# Patient Record
Sex: Female | Born: 1935 | Race: White | Hispanic: No | Marital: Married | Smoking: Former smoker
Health system: Southern US, Community
[De-identification: ages and names within clinical notes are randomized; demographics above are authoritative.]

## PROBLEM LIST (undated history)

## (undated) DIAGNOSIS — R739 Hyperglycemia, unspecified: Secondary | ICD-10-CM

## (undated) DIAGNOSIS — I639 Cerebral infarction, unspecified: Secondary | ICD-10-CM

## (undated) DIAGNOSIS — R079 Chest pain, unspecified: Secondary | ICD-10-CM

## (undated) DIAGNOSIS — E119 Type 2 diabetes mellitus without complications: Secondary | ICD-10-CM

## (undated) DIAGNOSIS — I10 Essential (primary) hypertension: Secondary | ICD-10-CM

## (undated) DIAGNOSIS — I429 Cardiomyopathy, unspecified: Secondary | ICD-10-CM

## (undated) DIAGNOSIS — I5022 Chronic systolic (congestive) heart failure: Secondary | ICD-10-CM

## (undated) DIAGNOSIS — K219 Gastro-esophageal reflux disease without esophagitis: Secondary | ICD-10-CM

## (undated) DIAGNOSIS — H409 Unspecified glaucoma: Secondary | ICD-10-CM

## (undated) DIAGNOSIS — R7303 Prediabetes: Secondary | ICD-10-CM

## (undated) DIAGNOSIS — E559 Vitamin D deficiency, unspecified: Secondary | ICD-10-CM

## (undated) DIAGNOSIS — F419 Anxiety disorder, unspecified: Secondary | ICD-10-CM

## (undated) DIAGNOSIS — I447 Left bundle-branch block, unspecified: Secondary | ICD-10-CM

## (undated) DIAGNOSIS — M1990 Unspecified osteoarthritis, unspecified site: Secondary | ICD-10-CM

## (undated) DIAGNOSIS — E785 Hyperlipidemia, unspecified: Secondary | ICD-10-CM

## (undated) DIAGNOSIS — H9190 Unspecified hearing loss, unspecified ear: Secondary | ICD-10-CM

## (undated) DIAGNOSIS — I4949 Other premature depolarization: Secondary | ICD-10-CM

## (undated) DIAGNOSIS — IMO0002 Reserved for concepts with insufficient information to code with codable children: Secondary | ICD-10-CM

## (undated) HISTORY — DX: Prediabetes: R73.03

## (undated) HISTORY — DX: Cardiomyopathy, unspecified: I42.9

## (undated) HISTORY — DX: Chest pain, unspecified: R07.9

## (undated) HISTORY — DX: Hyperlipidemia, unspecified: E78.5

## (undated) HISTORY — DX: Other premature depolarization: I49.49

## (undated) HISTORY — DX: Essential (primary) hypertension: I10

## (undated) HISTORY — DX: Unspecified glaucoma: H40.9

## (undated) HISTORY — DX: Reserved for concepts with insufficient information to code with codable children: IMO0002

## (undated) HISTORY — DX: Left bundle-branch block, unspecified: I44.7

## (undated) HISTORY — DX: Chronic systolic (congestive) heart failure: I50.22

## (undated) HISTORY — DX: Unspecified osteoarthritis, unspecified site: M19.90

## (undated) HISTORY — PX: TONSILLECTOMY: SHX5217

## (undated) HISTORY — DX: Cerebral infarction, unspecified: I63.9

## (undated) HISTORY — DX: Gastro-esophageal reflux disease without esophagitis: K21.9

---

## 1997-09-22 ENCOUNTER — Ambulatory Visit (HOSPITAL_COMMUNITY): Admission: RE | Admit: 1997-09-22 | Discharge: 1997-09-22 | Payer: Self-pay | Admitting: Internal Medicine

## 1998-04-13 ENCOUNTER — Ambulatory Visit (HOSPITAL_COMMUNITY): Admission: RE | Admit: 1998-04-13 | Discharge: 1998-04-13 | Payer: Self-pay | Admitting: Internal Medicine

## 1998-05-16 ENCOUNTER — Other Ambulatory Visit: Admission: RE | Admit: 1998-05-16 | Discharge: 1998-05-16 | Payer: Self-pay | Admitting: Gynecology

## 1999-05-25 ENCOUNTER — Encounter: Payer: Self-pay | Admitting: Internal Medicine

## 1999-05-25 ENCOUNTER — Ambulatory Visit (HOSPITAL_COMMUNITY): Admission: RE | Admit: 1999-05-25 | Discharge: 1999-05-25 | Payer: Self-pay | Admitting: Internal Medicine

## 1999-07-17 ENCOUNTER — Encounter: Payer: Self-pay | Admitting: Urology

## 1999-07-18 ENCOUNTER — Inpatient Hospital Stay (HOSPITAL_COMMUNITY): Admission: EM | Admit: 1999-07-18 | Discharge: 1999-07-20 | Payer: Self-pay | Admitting: Urology

## 2000-06-10 ENCOUNTER — Encounter: Payer: Self-pay | Admitting: Internal Medicine

## 2000-06-10 ENCOUNTER — Ambulatory Visit (HOSPITAL_COMMUNITY): Admission: RE | Admit: 2000-06-10 | Discharge: 2000-06-10 | Payer: Self-pay | Admitting: Internal Medicine

## 2001-06-12 ENCOUNTER — Ambulatory Visit (HOSPITAL_COMMUNITY): Admission: RE | Admit: 2001-06-12 | Discharge: 2001-06-12 | Payer: Self-pay | Admitting: Internal Medicine

## 2001-06-12 ENCOUNTER — Encounter: Payer: Self-pay | Admitting: Internal Medicine

## 2002-06-23 ENCOUNTER — Encounter: Payer: Self-pay | Admitting: Internal Medicine

## 2002-06-23 ENCOUNTER — Ambulatory Visit (HOSPITAL_COMMUNITY): Admission: RE | Admit: 2002-06-23 | Discharge: 2002-06-23 | Payer: Self-pay | Admitting: Internal Medicine

## 2003-05-06 ENCOUNTER — Other Ambulatory Visit: Admission: RE | Admit: 2003-05-06 | Discharge: 2003-05-06 | Payer: Self-pay | Admitting: Internal Medicine

## 2003-06-27 ENCOUNTER — Ambulatory Visit (HOSPITAL_COMMUNITY): Admission: RE | Admit: 2003-06-27 | Discharge: 2003-06-27 | Payer: Self-pay | Admitting: Internal Medicine

## 2004-06-27 ENCOUNTER — Ambulatory Visit (HOSPITAL_COMMUNITY): Admission: RE | Admit: 2004-06-27 | Discharge: 2004-06-27 | Payer: Self-pay | Admitting: Internal Medicine

## 2005-07-01 ENCOUNTER — Ambulatory Visit (HOSPITAL_COMMUNITY): Admission: RE | Admit: 2005-07-01 | Discharge: 2005-07-01 | Payer: Self-pay | Admitting: Internal Medicine

## 2005-07-22 ENCOUNTER — Encounter: Admission: RE | Admit: 2005-07-22 | Discharge: 2005-07-22 | Payer: Self-pay | Admitting: Internal Medicine

## 2005-11-13 ENCOUNTER — Encounter: Payer: Self-pay | Admitting: Internal Medicine

## 2006-08-13 ENCOUNTER — Ambulatory Visit (HOSPITAL_COMMUNITY): Admission: RE | Admit: 2006-08-13 | Discharge: 2006-08-13 | Payer: Self-pay | Admitting: Internal Medicine

## 2007-09-11 ENCOUNTER — Ambulatory Visit (HOSPITAL_COMMUNITY): Admission: RE | Admit: 2007-09-11 | Discharge: 2007-09-11 | Payer: Self-pay | Admitting: Internal Medicine

## 2008-07-11 ENCOUNTER — Encounter: Payer: Self-pay | Admitting: Internal Medicine

## 2008-09-12 DEATH — deceased

## 2008-10-11 ENCOUNTER — Ambulatory Visit (HOSPITAL_COMMUNITY): Admission: RE | Admit: 2008-10-11 | Discharge: 2008-10-11 | Payer: Self-pay | Admitting: Internal Medicine

## 2009-10-11 ENCOUNTER — Encounter: Payer: Self-pay | Admitting: Internal Medicine

## 2009-10-27 ENCOUNTER — Ambulatory Visit (HOSPITAL_COMMUNITY): Admission: RE | Admit: 2009-10-27 | Discharge: 2009-10-27 | Payer: Self-pay | Admitting: Internal Medicine

## 2010-02-19 ENCOUNTER — Encounter: Payer: Self-pay | Admitting: Internal Medicine

## 2010-02-20 ENCOUNTER — Ambulatory Visit: Payer: Self-pay | Admitting: Internal Medicine

## 2010-02-20 DIAGNOSIS — I447 Left bundle-branch block, unspecified: Secondary | ICD-10-CM

## 2010-02-20 DIAGNOSIS — I10 Essential (primary) hypertension: Secondary | ICD-10-CM

## 2010-03-15 ENCOUNTER — Ambulatory Visit (HOSPITAL_COMMUNITY): Admission: RE | Admit: 2010-03-15 | Discharge: 2010-03-15 | Payer: Self-pay | Admitting: Internal Medicine

## 2010-03-15 ENCOUNTER — Ambulatory Visit: Payer: Self-pay

## 2010-03-15 ENCOUNTER — Ambulatory Visit: Payer: Self-pay | Admitting: Cardiology

## 2010-08-05 ENCOUNTER — Encounter: Payer: Self-pay | Admitting: Internal Medicine

## 2010-08-14 NOTE — Letter (Signed)
Summary: [facility]  [facility]   Imported By: Marylou Mccoy 02/27/2010 11:45:24  _____________________________________________________________________  External Attachment:    Type:   Image     Comment:   External Document

## 2010-08-14 NOTE — Assessment & Plan Note (Signed)
Summary: Jonni Sanger pains,left bundle branch,.wpw-mb   Visit Type:  Initial Consult Primary Provider:  Dr. Marisue Brooklyn   History of Present Illness: Leslie [name] is referred today by Dr. Elisabeth Most for evaluation of symptomatic PVC's and LBBB.  The patient notes that she has been "disgustingly healthy" and has no c/p or sob or syncope.  She noted when she was taking her blood pressure that her pulse was irregular and she was found to have frequent PVC's.  She is about to be married and is referred for additional evaluation.  She remains active and has only rare palpitations.  No change in bowel or bladder habit.    Current Medications (verified): 1)  Quinapril Hcl 20 Mg Tabs (Quinapril Hcl) .... Take One Tablet By Mouth Once Daily. 2)  Atenolol 50 Mg Tabs (Atenolol) .... Take One Tablet By Mouth Daily 3)  Felodipine 5 Mg Xr24h-Tab (Felodipine) .... Take One Tablet By Mouth Once Daily. 4)  Estradiol 1 Mg Tabs (Estradiol) .... Take One Tablet By Mouth Once Daily. 5)  Estazolam 1 Mg Tabs (Estazolam) .... As Needed 6)  Flaxseed Oil 1000 Mg Caps (Flaxseed (Linseed)) .... Take One Tablet By Mouth Once Daily. 7)  Cod Liver Oil  Oil (Cod Liver Oil) .... Once Daily 8)  Multivitamins   Tabs (Multiple Vitamin) .... Once Daily 9)  Magnesium 250 Mg Tabs (Magnesium) .... 2 Tabs Daily 10)  Tramadol Hcl 50 Mg Tabs (Tramadol Hcl) .... 1/2 Tablet As Needed  Allergies (verified): 1)  ! Ibuprofen  Past History:  Past Medical History: Last updated: 02/20/2010 Current Problems:  CHEST PAIN (ICD-786.50) LBBB (ICD-426.3)  Family History: Non-contributory.  Her parents are both deceased of unknown causes. Two brothers died of MI.  Social History: Widowed times two. Soon to be married again No ETOH or tobacco.  Review of Systems       All systems reviewed and negative except as noted in the HPI.  Vital Signs:  Patient profile:   77 year old female Height:      62 inches Weight:      135  pounds BMI:     24.78 Pulse rate:   78 / minute BP sitting:   140 / 78  (left arm)  Vitals Entered By: Laurance Flatten CMA (February 20, 2010 12:23 PM)  Physical Exam  General:  Elderly, well developed, well nourished, in no acute distress.  HEENT: normal Neck: supple. No JVD. Carotids 2+ bilaterally no bruits Cor: IRRR no rubs, gallops or murmur Lungs: CTA Ab: soft, nontender. nondistended. No HSM. Good bowel sounds Ext: warm. no cyanosis, clubbing or edema Neuro: alert and oriented. Grossly nonfocal. affect pleasant    EKG  Procedure date:  02/20/2010  Findings:      Normal sinus rhythm with rate of:78.   PVC's noted.  Left axis deviation.    Impression & Recommendations:  Problem # 1:  LBBB (ICD-426.3) She has never had syncope.  Will followup. Her updated medication list for this problem includes:    Quinapril Hcl 20 Mg Tabs (Quinapril hcl) .Marland Kitchen... Take one tablet by mouth once daily.    Atenolol 50 Mg Tabs (Atenolol) .Marland Kitchen... Take one tablet by mouth daily    Felodipine 5 Mg Xr24h-tab (Felodipine) .Marland Kitchen... Take one tablet by mouth once daily.  Orders: Echocardiogram (Echo)  Problem # 2:  ESSENTIAL HYPERTENSION, BENIGN (ICD-401.1) Her blood pressure appears to be mostly controlled on meds as below. Her updated medication list for this problem includes:  Quinapril Hcl 20 Mg Tabs (Quinapril hcl) .Marland Kitchen... Take one tablet by mouth once daily.    Atenolol 50 Mg Tabs (Atenolol) .Marland Kitchen... Take one tablet by mouth daily    Felodipine 5 Mg Xr24h-tab (Felodipine) .Marland Kitchen... Take one tablet by mouth once daily.  Problem # 3:  PREMATURE VENTRICULAR CONTRACTIONS, FREQUENT (ICD-427.69) Her PVC's appear to be originating from the RVOT. Will continue current meds as they are not symptomatic and will check a 2D echo to evaluate her LV function. Her updated medication list for this problem includes:    Quinapril Hcl 20 Mg Tabs (Quinapril hcl) .Marland Kitchen... Take one tablet by mouth once daily.    Atenolol 50 Mg  Tabs (Atenolol) .Marland Kitchen... Take one tablet by mouth daily    Felodipine 5 Mg Xr24h-tab (Felodipine) .Marland Kitchen... Take one tablet by mouth once daily.  Patient Instructions: 1)  Your physician recommends that you schedule a follow-up appointment in: 12 months. 2)  Your physician recommends that you continue on your current medications as directed. Please refer to the Current Medication list given to you today. 3)  Your physician has requested that you have an echocardiogram.  Echocardiography is a painless test that uses sound waves to create images of your heart. It provides your doctor with information about the size and shape of your heart and how well your heart's chambers and valves are working.  This procedure takes approximately one hour. There are no restrictions for this procedure.

## 2010-08-14 NOTE — Letter (Signed)
Summary: [facility]  [facility]   Imported By: Marylou Mccoy 02/27/2010 11:37:27  _____________________________________________________________________  External Attachment:    Type:   Image     Comment:   External Document

## 2010-11-29 ENCOUNTER — Other Ambulatory Visit (HOSPITAL_COMMUNITY): Payer: Self-pay | Admitting: Internal Medicine

## 2010-11-29 DIAGNOSIS — Z1231 Encounter for screening mammogram for malignant neoplasm of breast: Secondary | ICD-10-CM

## 2010-12-12 ENCOUNTER — Ambulatory Visit (HOSPITAL_COMMUNITY)
Admission: RE | Admit: 2010-12-12 | Discharge: 2010-12-12 | Disposition: A | Discharge status: Home / Self Care | Payer: Medicare Other | Source: Ambulatory Visit | Attending: Internal Medicine | Admitting: Internal Medicine

## 2010-12-12 DIAGNOSIS — Z1231 Encounter for screening mammogram for malignant neoplasm of breast: Secondary | ICD-10-CM | POA: Insufficient documentation

## 2011-07-16 HISTORY — PX: GLAUCOMA SURGERY: SHX656

## 2011-07-23 DIAGNOSIS — H40019 Open angle with borderline findings, low risk, unspecified eye: Secondary | ICD-10-CM | POA: Diagnosis not present

## 2011-07-23 DIAGNOSIS — H2510 Age-related nuclear cataract, unspecified eye: Secondary | ICD-10-CM | POA: Diagnosis not present

## 2011-07-30 DIAGNOSIS — H40239 Intermittent angle-closure glaucoma, unspecified eye: Secondary | ICD-10-CM | POA: Diagnosis not present

## 2011-08-12 DIAGNOSIS — E782 Mixed hyperlipidemia: Secondary | ICD-10-CM | POA: Diagnosis not present

## 2011-08-12 DIAGNOSIS — R7309 Other abnormal glucose: Secondary | ICD-10-CM | POA: Diagnosis not present

## 2011-08-12 DIAGNOSIS — I10 Essential (primary) hypertension: Secondary | ICD-10-CM | POA: Diagnosis not present

## 2011-10-18 DIAGNOSIS — H40039 Anatomical narrow angle, unspecified eye: Secondary | ICD-10-CM | POA: Diagnosis not present

## 2011-11-11 DIAGNOSIS — R7309 Other abnormal glucose: Secondary | ICD-10-CM | POA: Diagnosis not present

## 2011-11-11 DIAGNOSIS — E782 Mixed hyperlipidemia: Secondary | ICD-10-CM | POA: Diagnosis not present

## 2011-11-11 DIAGNOSIS — Z79899 Other long term (current) drug therapy: Secondary | ICD-10-CM | POA: Diagnosis not present

## 2011-11-11 DIAGNOSIS — I10 Essential (primary) hypertension: Secondary | ICD-10-CM | POA: Diagnosis not present

## 2011-11-11 DIAGNOSIS — E559 Vitamin D deficiency, unspecified: Secondary | ICD-10-CM | POA: Diagnosis not present

## 2011-12-02 DIAGNOSIS — H4020X0 Unspecified primary angle-closure glaucoma, stage unspecified: Secondary | ICD-10-CM | POA: Diagnosis not present

## 2011-12-02 DIAGNOSIS — H40039 Anatomical narrow angle, unspecified eye: Secondary | ICD-10-CM | POA: Diagnosis not present

## 2011-12-30 DIAGNOSIS — M999 Biomechanical lesion, unspecified: Secondary | ICD-10-CM | POA: Diagnosis not present

## 2011-12-30 DIAGNOSIS — M62838 Other muscle spasm: Secondary | ICD-10-CM | POA: Diagnosis not present

## 2011-12-30 DIAGNOSIS — M5137 Other intervertebral disc degeneration, lumbosacral region: Secondary | ICD-10-CM | POA: Diagnosis not present

## 2012-01-01 DIAGNOSIS — M999 Biomechanical lesion, unspecified: Secondary | ICD-10-CM | POA: Diagnosis not present

## 2012-01-01 DIAGNOSIS — M5137 Other intervertebral disc degeneration, lumbosacral region: Secondary | ICD-10-CM | POA: Diagnosis not present

## 2012-01-01 DIAGNOSIS — M62838 Other muscle spasm: Secondary | ICD-10-CM | POA: Diagnosis not present

## 2012-01-03 DIAGNOSIS — M5137 Other intervertebral disc degeneration, lumbosacral region: Secondary | ICD-10-CM | POA: Diagnosis not present

## 2012-01-03 DIAGNOSIS — M999 Biomechanical lesion, unspecified: Secondary | ICD-10-CM | POA: Diagnosis not present

## 2012-01-03 DIAGNOSIS — M62838 Other muscle spasm: Secondary | ICD-10-CM | POA: Diagnosis not present

## 2012-01-06 DIAGNOSIS — M62838 Other muscle spasm: Secondary | ICD-10-CM | POA: Diagnosis not present

## 2012-01-06 DIAGNOSIS — M999 Biomechanical lesion, unspecified: Secondary | ICD-10-CM | POA: Diagnosis not present

## 2012-01-06 DIAGNOSIS — M5137 Other intervertebral disc degeneration, lumbosacral region: Secondary | ICD-10-CM | POA: Diagnosis not present

## 2012-01-08 DIAGNOSIS — M5137 Other intervertebral disc degeneration, lumbosacral region: Secondary | ICD-10-CM | POA: Diagnosis not present

## 2012-01-08 DIAGNOSIS — M999 Biomechanical lesion, unspecified: Secondary | ICD-10-CM | POA: Diagnosis not present

## 2012-01-08 DIAGNOSIS — M62838 Other muscle spasm: Secondary | ICD-10-CM | POA: Diagnosis not present

## 2012-01-10 DIAGNOSIS — M5137 Other intervertebral disc degeneration, lumbosacral region: Secondary | ICD-10-CM | POA: Diagnosis not present

## 2012-01-10 DIAGNOSIS — M62838 Other muscle spasm: Secondary | ICD-10-CM | POA: Diagnosis not present

## 2012-01-10 DIAGNOSIS — M999 Biomechanical lesion, unspecified: Secondary | ICD-10-CM | POA: Diagnosis not present

## 2012-01-13 DIAGNOSIS — M62838 Other muscle spasm: Secondary | ICD-10-CM | POA: Diagnosis not present

## 2012-01-13 DIAGNOSIS — M999 Biomechanical lesion, unspecified: Secondary | ICD-10-CM | POA: Diagnosis not present

## 2012-01-13 DIAGNOSIS — M5137 Other intervertebral disc degeneration, lumbosacral region: Secondary | ICD-10-CM | POA: Diagnosis not present

## 2012-01-15 DIAGNOSIS — M5137 Other intervertebral disc degeneration, lumbosacral region: Secondary | ICD-10-CM | POA: Diagnosis not present

## 2012-01-15 DIAGNOSIS — M62838 Other muscle spasm: Secondary | ICD-10-CM | POA: Diagnosis not present

## 2012-01-15 DIAGNOSIS — M999 Biomechanical lesion, unspecified: Secondary | ICD-10-CM | POA: Diagnosis not present

## 2012-01-20 DIAGNOSIS — M5137 Other intervertebral disc degeneration, lumbosacral region: Secondary | ICD-10-CM | POA: Diagnosis not present

## 2012-01-20 DIAGNOSIS — M999 Biomechanical lesion, unspecified: Secondary | ICD-10-CM | POA: Diagnosis not present

## 2012-01-20 DIAGNOSIS — M62838 Other muscle spasm: Secondary | ICD-10-CM | POA: Diagnosis not present

## 2012-01-22 DIAGNOSIS — M999 Biomechanical lesion, unspecified: Secondary | ICD-10-CM | POA: Diagnosis not present

## 2012-01-22 DIAGNOSIS — M62838 Other muscle spasm: Secondary | ICD-10-CM | POA: Diagnosis not present

## 2012-01-22 DIAGNOSIS — M5137 Other intervertebral disc degeneration, lumbosacral region: Secondary | ICD-10-CM | POA: Diagnosis not present

## 2012-01-24 DIAGNOSIS — M62838 Other muscle spasm: Secondary | ICD-10-CM | POA: Diagnosis not present

## 2012-01-24 DIAGNOSIS — M5137 Other intervertebral disc degeneration, lumbosacral region: Secondary | ICD-10-CM | POA: Diagnosis not present

## 2012-01-24 DIAGNOSIS — M999 Biomechanical lesion, unspecified: Secondary | ICD-10-CM | POA: Diagnosis not present

## 2012-01-28 DIAGNOSIS — M5137 Other intervertebral disc degeneration, lumbosacral region: Secondary | ICD-10-CM | POA: Diagnosis not present

## 2012-01-28 DIAGNOSIS — M999 Biomechanical lesion, unspecified: Secondary | ICD-10-CM | POA: Diagnosis not present

## 2012-01-28 DIAGNOSIS — M62838 Other muscle spasm: Secondary | ICD-10-CM | POA: Diagnosis not present

## 2012-01-31 DIAGNOSIS — M5137 Other intervertebral disc degeneration, lumbosacral region: Secondary | ICD-10-CM | POA: Diagnosis not present

## 2012-01-31 DIAGNOSIS — M999 Biomechanical lesion, unspecified: Secondary | ICD-10-CM | POA: Diagnosis not present

## 2012-01-31 DIAGNOSIS — M62838 Other muscle spasm: Secondary | ICD-10-CM | POA: Diagnosis not present

## 2012-02-03 DIAGNOSIS — H4020X0 Unspecified primary angle-closure glaucoma, stage unspecified: Secondary | ICD-10-CM | POA: Diagnosis not present

## 2012-02-03 DIAGNOSIS — H40039 Anatomical narrow angle, unspecified eye: Secondary | ICD-10-CM | POA: Diagnosis not present

## 2012-02-10 DIAGNOSIS — M62838 Other muscle spasm: Secondary | ICD-10-CM | POA: Diagnosis not present

## 2012-02-10 DIAGNOSIS — M5137 Other intervertebral disc degeneration, lumbosacral region: Secondary | ICD-10-CM | POA: Diagnosis not present

## 2012-02-10 DIAGNOSIS — M999 Biomechanical lesion, unspecified: Secondary | ICD-10-CM | POA: Diagnosis not present

## 2012-02-12 ENCOUNTER — Ambulatory Visit (HOSPITAL_COMMUNITY)
Admission: RE | Admit: 2012-02-12 | Discharge: 2012-02-12 | Disposition: A | Discharge status: Home / Self Care | Payer: Medicare Other | Source: Ambulatory Visit | Attending: Internal Medicine | Admitting: Internal Medicine

## 2012-02-12 ENCOUNTER — Encounter: Payer: Self-pay | Admitting: Internal Medicine

## 2012-02-12 ENCOUNTER — Other Ambulatory Visit (HOSPITAL_COMMUNITY): Payer: Self-pay | Admitting: Internal Medicine

## 2012-02-12 DIAGNOSIS — Z1212 Encounter for screening for malignant neoplasm of rectum: Secondary | ICD-10-CM | POA: Diagnosis not present

## 2012-02-12 DIAGNOSIS — R059 Cough, unspecified: Secondary | ICD-10-CM | POA: Insufficient documentation

## 2012-02-12 DIAGNOSIS — R05 Cough: Secondary | ICD-10-CM

## 2012-02-12 DIAGNOSIS — E559 Vitamin D deficiency, unspecified: Secondary | ICD-10-CM | POA: Diagnosis not present

## 2012-02-12 DIAGNOSIS — R7309 Other abnormal glucose: Secondary | ICD-10-CM | POA: Diagnosis not present

## 2012-02-12 DIAGNOSIS — E782 Mixed hyperlipidemia: Secondary | ICD-10-CM | POA: Diagnosis not present

## 2012-02-12 DIAGNOSIS — I10 Essential (primary) hypertension: Secondary | ICD-10-CM | POA: Diagnosis not present

## 2012-02-12 DIAGNOSIS — R5383 Other fatigue: Secondary | ICD-10-CM | POA: Diagnosis not present

## 2012-02-18 DIAGNOSIS — M999 Biomechanical lesion, unspecified: Secondary | ICD-10-CM | POA: Diagnosis not present

## 2012-02-18 DIAGNOSIS — M5137 Other intervertebral disc degeneration, lumbosacral region: Secondary | ICD-10-CM | POA: Diagnosis not present

## 2012-02-18 DIAGNOSIS — M62838 Other muscle spasm: Secondary | ICD-10-CM | POA: Diagnosis not present

## 2012-02-25 DIAGNOSIS — M999 Biomechanical lesion, unspecified: Secondary | ICD-10-CM | POA: Diagnosis not present

## 2012-02-25 DIAGNOSIS — M62838 Other muscle spasm: Secondary | ICD-10-CM | POA: Diagnosis not present

## 2012-02-25 DIAGNOSIS — M5137 Other intervertebral disc degeneration, lumbosacral region: Secondary | ICD-10-CM | POA: Diagnosis not present

## 2012-03-03 DIAGNOSIS — Z1231 Encounter for screening mammogram for malignant neoplasm of breast: Secondary | ICD-10-CM | POA: Diagnosis not present

## 2012-03-03 DIAGNOSIS — M999 Biomechanical lesion, unspecified: Secondary | ICD-10-CM | POA: Diagnosis not present

## 2012-03-03 DIAGNOSIS — M62838 Other muscle spasm: Secondary | ICD-10-CM | POA: Diagnosis not present

## 2012-03-03 DIAGNOSIS — M5137 Other intervertebral disc degeneration, lumbosacral region: Secondary | ICD-10-CM | POA: Diagnosis not present

## 2012-03-03 DIAGNOSIS — Z13820 Encounter for screening for osteoporosis: Secondary | ICD-10-CM | POA: Diagnosis not present

## 2012-03-06 ENCOUNTER — Encounter: Payer: Self-pay | Admitting: Cardiology

## 2012-03-10 ENCOUNTER — Ambulatory Visit (INDEPENDENT_AMBULATORY_CARE_PROVIDER_SITE_OTHER): Payer: Medicare Other | Admitting: Internal Medicine

## 2012-03-10 ENCOUNTER — Encounter: Payer: Self-pay | Admitting: Internal Medicine

## 2012-03-10 VITALS — BP 134/72 | HR 73 | Ht 62.0 in | Wt 136.0 lb

## 2012-03-10 DIAGNOSIS — I447 Left bundle-branch block, unspecified: Secondary | ICD-10-CM | POA: Diagnosis not present

## 2012-03-10 NOTE — Progress Notes (Signed)
HPI [name] is referred today for evaluation of an abnormal electrocardiogram. She is a very pleasant 78 year old woman whose health has been quite good. She has mild hypertension. She tells me that she has had left bundle branch block diagnosed in the past. She denies chest pain, shortness of breath, or syncope. No near syncope or changes in vision. She has minimal peripheral edema but is otherwise been stable. Allergies  Allergen Reactions  . Advil (Ibuprofen)      Current Outpatient Prescriptions  Medication Sig Dispense Refill  . atenolol (TENORMIN) 50 MG tablet Take 25 mg by mouth daily.       Marland Kitchen estazolam (PROSOM) 1 MG tablet Take 0.5 mg by mouth at bedtime as needed.       Marland Kitchen estradiol (ESTRACE) 1 MG tablet Take 1 mg by mouth daily.      . felodipine (PLENDIL) 5 MG 24 hr tablet Take 5 mg by mouth daily.      . Flaxseed, Linseed, (FLAXSEED OIL) 1000 MG CAPS Take 1,000 mg by mouth daily.      . magnesium oxide (MAG-OX) 400 MG tablet Take 400 mg by mouth daily.      . Multiple Vitamin (MULTIVITAMIN) tablet Take 1 tablet by mouth daily.      . quinapril (ACCUPRIL) 20 MG tablet Take 20 mg by mouth at bedtime.      . traMADol (ULTRAM) 50 MG tablet Take 25 mg by mouth every 6 (six) hours as needed.         Past Medical History  Diagnosis Date  . Chest pain   . Essential hypertension, benign   . Other left bundle branch block   . Other premature beats     ROS:   All systems reviewed and negative except as noted in the HPI.   No past surgical history on file.   Family History  Problem Relation Age of Onset  . Heart attack Brother   . Heart attack Brother      History   Social History  . Marital Status: Married    Spouse Name: N/A    Number of Children: N/A  . Years of Education: N/A   Occupational History  . Not on file.   Social History Main Topics  . Smoking status: Never Smoker   . Smokeless tobacco: Not on file  . Alcohol Use: No  . Drug Use: Not on file   . Sexually Active: Not on file   Other Topics Concern  . Not on file   Social History Narrative  . No narrative on file     BP 134/72  Pulse 73  Ht 5\' 2"  (1.575 m)  Wt 136 lb (61.689 kg)  BMI 24.87 kg/m2  SpO2 94%  Physical Exam:  Well appearing elderly woman, NAD HEENT: Unremarkable Neck:  No JVD, no thyromegally Lungs:  Clear with no wheezes, rales, or rhonchi. HEART:  Regular rate rhythm, no murmurs, no rubs, no clicks split S2 is present Abd:  soft, positive bowel sounds, no organomegally, no rebound, no guarding Ext:  2 plus pulses, no edema, no cyanosis, no clubbing Skin:  No rashes no nodules Neuro:  CN II through XII intact, motor grossly intact  EKG Normal sinus rhythm with left bundle branch block.  Assess/Plan:

## 2012-03-10 NOTE — Patient Instructions (Signed)
Your physician recommends that you schedule a follow-up appointment as needed  

## 2012-03-10 NOTE — Assessment & Plan Note (Addendum)
Her abnormal ECG is due left bundle branch block and not due to the WPW syndrome. She does not have this diagnosis. I've recommended watchful waiting for this patient. No additional testing is required or indicated at this time. I did discuss the possibility of worsening heart block and the need for permanent pacemaker. She is currently no indication for pacing. She will call us if she has symptoms in this manner.

## 2012-03-11 DIAGNOSIS — M999 Biomechanical lesion, unspecified: Secondary | ICD-10-CM | POA: Diagnosis not present

## 2012-03-11 DIAGNOSIS — M62838 Other muscle spasm: Secondary | ICD-10-CM | POA: Diagnosis not present

## 2012-03-11 DIAGNOSIS — M5137 Other intervertebral disc degeneration, lumbosacral region: Secondary | ICD-10-CM | POA: Diagnosis not present

## 2012-03-17 DIAGNOSIS — H2510 Age-related nuclear cataract, unspecified eye: Secondary | ICD-10-CM | POA: Diagnosis not present

## 2012-03-17 DIAGNOSIS — H4050X0 Glaucoma secondary to other eye disorders, unspecified eye, stage unspecified: Secondary | ICD-10-CM | POA: Diagnosis not present

## 2012-03-25 DIAGNOSIS — M62838 Other muscle spasm: Secondary | ICD-10-CM | POA: Diagnosis not present

## 2012-03-25 DIAGNOSIS — M5137 Other intervertebral disc degeneration, lumbosacral region: Secondary | ICD-10-CM | POA: Diagnosis not present

## 2012-03-25 DIAGNOSIS — M999 Biomechanical lesion, unspecified: Secondary | ICD-10-CM | POA: Diagnosis not present

## 2012-04-08 DIAGNOSIS — M62838 Other muscle spasm: Secondary | ICD-10-CM | POA: Diagnosis not present

## 2012-04-08 DIAGNOSIS — M5137 Other intervertebral disc degeneration, lumbosacral region: Secondary | ICD-10-CM | POA: Diagnosis not present

## 2012-04-08 DIAGNOSIS — M999 Biomechanical lesion, unspecified: Secondary | ICD-10-CM | POA: Diagnosis not present

## 2012-04-22 DIAGNOSIS — M999 Biomechanical lesion, unspecified: Secondary | ICD-10-CM | POA: Diagnosis not present

## 2012-04-22 DIAGNOSIS — M62838 Other muscle spasm: Secondary | ICD-10-CM | POA: Diagnosis not present

## 2012-04-22 DIAGNOSIS — M5137 Other intervertebral disc degeneration, lumbosacral region: Secondary | ICD-10-CM | POA: Diagnosis not present

## 2012-05-26 DIAGNOSIS — Z79899 Other long term (current) drug therapy: Secondary | ICD-10-CM | POA: Diagnosis not present

## 2012-05-26 DIAGNOSIS — I10 Essential (primary) hypertension: Secondary | ICD-10-CM | POA: Diagnosis not present

## 2012-05-26 DIAGNOSIS — R7309 Other abnormal glucose: Secondary | ICD-10-CM | POA: Diagnosis not present

## 2012-05-26 DIAGNOSIS — E782 Mixed hyperlipidemia: Secondary | ICD-10-CM | POA: Diagnosis not present

## 2012-05-26 DIAGNOSIS — E559 Vitamin D deficiency, unspecified: Secondary | ICD-10-CM | POA: Diagnosis not present

## 2012-09-09 DIAGNOSIS — I10 Essential (primary) hypertension: Secondary | ICD-10-CM | POA: Diagnosis not present

## 2012-09-09 DIAGNOSIS — R7309 Other abnormal glucose: Secondary | ICD-10-CM | POA: Diagnosis not present

## 2012-09-09 DIAGNOSIS — E782 Mixed hyperlipidemia: Secondary | ICD-10-CM | POA: Diagnosis not present

## 2012-09-09 DIAGNOSIS — Z79899 Other long term (current) drug therapy: Secondary | ICD-10-CM | POA: Diagnosis not present

## 2012-09-09 DIAGNOSIS — E559 Vitamin D deficiency, unspecified: Secondary | ICD-10-CM | POA: Diagnosis not present

## 2012-09-10 DIAGNOSIS — R7309 Other abnormal glucose: Secondary | ICD-10-CM | POA: Diagnosis not present

## 2012-09-10 DIAGNOSIS — I10 Essential (primary) hypertension: Secondary | ICD-10-CM | POA: Diagnosis not present

## 2012-09-10 DIAGNOSIS — Z79899 Other long term (current) drug therapy: Secondary | ICD-10-CM | POA: Diagnosis not present

## 2012-09-10 DIAGNOSIS — E782 Mixed hyperlipidemia: Secondary | ICD-10-CM | POA: Diagnosis not present

## 2012-09-10 DIAGNOSIS — E559 Vitamin D deficiency, unspecified: Secondary | ICD-10-CM | POA: Diagnosis not present

## 2012-09-17 DIAGNOSIS — H4011X0 Primary open-angle glaucoma, stage unspecified: Secondary | ICD-10-CM | POA: Diagnosis not present

## 2012-09-17 DIAGNOSIS — H409 Unspecified glaucoma: Secondary | ICD-10-CM | POA: Diagnosis not present

## 2012-11-11 DIAGNOSIS — H4011X0 Primary open-angle glaucoma, stage unspecified: Secondary | ICD-10-CM | POA: Diagnosis not present

## 2012-11-11 DIAGNOSIS — H409 Unspecified glaucoma: Secondary | ICD-10-CM | POA: Diagnosis not present

## 2012-11-26 DIAGNOSIS — M9981 Other biomechanical lesions of cervical region: Secondary | ICD-10-CM | POA: Diagnosis not present

## 2012-11-26 DIAGNOSIS — IMO0001 Reserved for inherently not codable concepts without codable children: Secondary | ICD-10-CM | POA: Diagnosis not present

## 2012-11-26 DIAGNOSIS — IMO0002 Reserved for concepts with insufficient information to code with codable children: Secondary | ICD-10-CM | POA: Diagnosis not present

## 2012-11-26 DIAGNOSIS — M461 Sacroiliitis, not elsewhere classified: Secondary | ICD-10-CM | POA: Diagnosis not present

## 2012-11-26 DIAGNOSIS — M999 Biomechanical lesion, unspecified: Secondary | ICD-10-CM | POA: Diagnosis not present

## 2012-11-30 DIAGNOSIS — IMO0002 Reserved for concepts with insufficient information to code with codable children: Secondary | ICD-10-CM | POA: Diagnosis not present

## 2012-11-30 DIAGNOSIS — M9981 Other biomechanical lesions of cervical region: Secondary | ICD-10-CM | POA: Diagnosis not present

## 2012-11-30 DIAGNOSIS — IMO0001 Reserved for inherently not codable concepts without codable children: Secondary | ICD-10-CM | POA: Diagnosis not present

## 2012-11-30 DIAGNOSIS — M999 Biomechanical lesion, unspecified: Secondary | ICD-10-CM | POA: Diagnosis not present

## 2012-11-30 DIAGNOSIS — M461 Sacroiliitis, not elsewhere classified: Secondary | ICD-10-CM | POA: Diagnosis not present

## 2012-12-01 DIAGNOSIS — M9981 Other biomechanical lesions of cervical region: Secondary | ICD-10-CM | POA: Diagnosis not present

## 2012-12-01 DIAGNOSIS — M461 Sacroiliitis, not elsewhere classified: Secondary | ICD-10-CM | POA: Diagnosis not present

## 2012-12-01 DIAGNOSIS — IMO0001 Reserved for inherently not codable concepts without codable children: Secondary | ICD-10-CM | POA: Diagnosis not present

## 2012-12-01 DIAGNOSIS — IMO0002 Reserved for concepts with insufficient information to code with codable children: Secondary | ICD-10-CM | POA: Diagnosis not present

## 2012-12-01 DIAGNOSIS — M999 Biomechanical lesion, unspecified: Secondary | ICD-10-CM | POA: Diagnosis not present

## 2012-12-02 DIAGNOSIS — M9981 Other biomechanical lesions of cervical region: Secondary | ICD-10-CM | POA: Diagnosis not present

## 2012-12-02 DIAGNOSIS — M461 Sacroiliitis, not elsewhere classified: Secondary | ICD-10-CM | POA: Diagnosis not present

## 2012-12-02 DIAGNOSIS — IMO0002 Reserved for concepts with insufficient information to code with codable children: Secondary | ICD-10-CM | POA: Diagnosis not present

## 2012-12-02 DIAGNOSIS — M999 Biomechanical lesion, unspecified: Secondary | ICD-10-CM | POA: Diagnosis not present

## 2012-12-02 DIAGNOSIS — IMO0001 Reserved for inherently not codable concepts without codable children: Secondary | ICD-10-CM | POA: Diagnosis not present

## 2012-12-03 DIAGNOSIS — IMO0002 Reserved for concepts with insufficient information to code with codable children: Secondary | ICD-10-CM | POA: Diagnosis not present

## 2012-12-03 DIAGNOSIS — M9981 Other biomechanical lesions of cervical region: Secondary | ICD-10-CM | POA: Diagnosis not present

## 2012-12-03 DIAGNOSIS — M461 Sacroiliitis, not elsewhere classified: Secondary | ICD-10-CM | POA: Diagnosis not present

## 2012-12-03 DIAGNOSIS — M999 Biomechanical lesion, unspecified: Secondary | ICD-10-CM | POA: Diagnosis not present

## 2012-12-03 DIAGNOSIS — IMO0001 Reserved for inherently not codable concepts without codable children: Secondary | ICD-10-CM | POA: Diagnosis not present

## 2012-12-08 DIAGNOSIS — M9981 Other biomechanical lesions of cervical region: Secondary | ICD-10-CM | POA: Diagnosis not present

## 2012-12-08 DIAGNOSIS — M999 Biomechanical lesion, unspecified: Secondary | ICD-10-CM | POA: Diagnosis not present

## 2012-12-08 DIAGNOSIS — IMO0002 Reserved for concepts with insufficient information to code with codable children: Secondary | ICD-10-CM | POA: Diagnosis not present

## 2012-12-08 DIAGNOSIS — M461 Sacroiliitis, not elsewhere classified: Secondary | ICD-10-CM | POA: Diagnosis not present

## 2012-12-08 DIAGNOSIS — IMO0001 Reserved for inherently not codable concepts without codable children: Secondary | ICD-10-CM | POA: Diagnosis not present

## 2012-12-10 DIAGNOSIS — M461 Sacroiliitis, not elsewhere classified: Secondary | ICD-10-CM | POA: Diagnosis not present

## 2012-12-10 DIAGNOSIS — IMO0002 Reserved for concepts with insufficient information to code with codable children: Secondary | ICD-10-CM | POA: Diagnosis not present

## 2012-12-10 DIAGNOSIS — M999 Biomechanical lesion, unspecified: Secondary | ICD-10-CM | POA: Diagnosis not present

## 2012-12-10 DIAGNOSIS — M9981 Other biomechanical lesions of cervical region: Secondary | ICD-10-CM | POA: Diagnosis not present

## 2012-12-10 DIAGNOSIS — IMO0001 Reserved for inherently not codable concepts without codable children: Secondary | ICD-10-CM | POA: Diagnosis not present

## 2012-12-14 DIAGNOSIS — IMO0001 Reserved for inherently not codable concepts without codable children: Secondary | ICD-10-CM | POA: Diagnosis not present

## 2012-12-14 DIAGNOSIS — M461 Sacroiliitis, not elsewhere classified: Secondary | ICD-10-CM | POA: Diagnosis not present

## 2012-12-14 DIAGNOSIS — M9981 Other biomechanical lesions of cervical region: Secondary | ICD-10-CM | POA: Diagnosis not present

## 2012-12-14 DIAGNOSIS — IMO0002 Reserved for concepts with insufficient information to code with codable children: Secondary | ICD-10-CM | POA: Diagnosis not present

## 2012-12-14 DIAGNOSIS — M999 Biomechanical lesion, unspecified: Secondary | ICD-10-CM | POA: Diagnosis not present

## 2012-12-15 DIAGNOSIS — M461 Sacroiliitis, not elsewhere classified: Secondary | ICD-10-CM | POA: Diagnosis not present

## 2012-12-15 DIAGNOSIS — IMO0001 Reserved for inherently not codable concepts without codable children: Secondary | ICD-10-CM | POA: Diagnosis not present

## 2012-12-15 DIAGNOSIS — IMO0002 Reserved for concepts with insufficient information to code with codable children: Secondary | ICD-10-CM | POA: Diagnosis not present

## 2012-12-15 DIAGNOSIS — M9981 Other biomechanical lesions of cervical region: Secondary | ICD-10-CM | POA: Diagnosis not present

## 2012-12-15 DIAGNOSIS — M999 Biomechanical lesion, unspecified: Secondary | ICD-10-CM | POA: Diagnosis not present

## 2012-12-16 DIAGNOSIS — Z79899 Other long term (current) drug therapy: Secondary | ICD-10-CM | POA: Diagnosis not present

## 2012-12-16 DIAGNOSIS — E782 Mixed hyperlipidemia: Secondary | ICD-10-CM | POA: Diagnosis not present

## 2012-12-16 DIAGNOSIS — I10 Essential (primary) hypertension: Secondary | ICD-10-CM | POA: Diagnosis not present

## 2012-12-16 DIAGNOSIS — E559 Vitamin D deficiency, unspecified: Secondary | ICD-10-CM | POA: Diagnosis not present

## 2012-12-16 DIAGNOSIS — R7309 Other abnormal glucose: Secondary | ICD-10-CM | POA: Diagnosis not present

## 2012-12-17 DIAGNOSIS — IMO0002 Reserved for concepts with insufficient information to code with codable children: Secondary | ICD-10-CM | POA: Diagnosis not present

## 2012-12-17 DIAGNOSIS — IMO0001 Reserved for inherently not codable concepts without codable children: Secondary | ICD-10-CM | POA: Diagnosis not present

## 2012-12-17 DIAGNOSIS — M461 Sacroiliitis, not elsewhere classified: Secondary | ICD-10-CM | POA: Diagnosis not present

## 2012-12-17 DIAGNOSIS — M9981 Other biomechanical lesions of cervical region: Secondary | ICD-10-CM | POA: Diagnosis not present

## 2012-12-17 DIAGNOSIS — M999 Biomechanical lesion, unspecified: Secondary | ICD-10-CM | POA: Diagnosis not present

## 2012-12-21 DIAGNOSIS — IMO0001 Reserved for inherently not codable concepts without codable children: Secondary | ICD-10-CM | POA: Diagnosis not present

## 2012-12-21 DIAGNOSIS — M999 Biomechanical lesion, unspecified: Secondary | ICD-10-CM | POA: Diagnosis not present

## 2012-12-21 DIAGNOSIS — IMO0002 Reserved for concepts with insufficient information to code with codable children: Secondary | ICD-10-CM | POA: Diagnosis not present

## 2012-12-21 DIAGNOSIS — M461 Sacroiliitis, not elsewhere classified: Secondary | ICD-10-CM | POA: Diagnosis not present

## 2012-12-21 DIAGNOSIS — M9981 Other biomechanical lesions of cervical region: Secondary | ICD-10-CM | POA: Diagnosis not present

## 2012-12-22 DIAGNOSIS — M461 Sacroiliitis, not elsewhere classified: Secondary | ICD-10-CM | POA: Diagnosis not present

## 2012-12-22 DIAGNOSIS — M9981 Other biomechanical lesions of cervical region: Secondary | ICD-10-CM | POA: Diagnosis not present

## 2012-12-22 DIAGNOSIS — M999 Biomechanical lesion, unspecified: Secondary | ICD-10-CM | POA: Diagnosis not present

## 2012-12-22 DIAGNOSIS — IMO0002 Reserved for concepts with insufficient information to code with codable children: Secondary | ICD-10-CM | POA: Diagnosis not present

## 2012-12-22 DIAGNOSIS — IMO0001 Reserved for inherently not codable concepts without codable children: Secondary | ICD-10-CM | POA: Diagnosis not present

## 2012-12-24 DIAGNOSIS — M461 Sacroiliitis, not elsewhere classified: Secondary | ICD-10-CM | POA: Diagnosis not present

## 2012-12-24 DIAGNOSIS — IMO0002 Reserved for concepts with insufficient information to code with codable children: Secondary | ICD-10-CM | POA: Diagnosis not present

## 2012-12-24 DIAGNOSIS — IMO0001 Reserved for inherently not codable concepts without codable children: Secondary | ICD-10-CM | POA: Diagnosis not present

## 2012-12-24 DIAGNOSIS — M9981 Other biomechanical lesions of cervical region: Secondary | ICD-10-CM | POA: Diagnosis not present

## 2012-12-24 DIAGNOSIS — M999 Biomechanical lesion, unspecified: Secondary | ICD-10-CM | POA: Diagnosis not present

## 2012-12-28 DIAGNOSIS — IMO0001 Reserved for inherently not codable concepts without codable children: Secondary | ICD-10-CM | POA: Diagnosis not present

## 2012-12-28 DIAGNOSIS — M9981 Other biomechanical lesions of cervical region: Secondary | ICD-10-CM | POA: Diagnosis not present

## 2012-12-28 DIAGNOSIS — M999 Biomechanical lesion, unspecified: Secondary | ICD-10-CM | POA: Diagnosis not present

## 2012-12-28 DIAGNOSIS — M461 Sacroiliitis, not elsewhere classified: Secondary | ICD-10-CM | POA: Diagnosis not present

## 2012-12-28 DIAGNOSIS — IMO0002 Reserved for concepts with insufficient information to code with codable children: Secondary | ICD-10-CM | POA: Diagnosis not present

## 2012-12-29 DIAGNOSIS — M999 Biomechanical lesion, unspecified: Secondary | ICD-10-CM | POA: Diagnosis not present

## 2012-12-29 DIAGNOSIS — IMO0002 Reserved for concepts with insufficient information to code with codable children: Secondary | ICD-10-CM | POA: Diagnosis not present

## 2012-12-29 DIAGNOSIS — M461 Sacroiliitis, not elsewhere classified: Secondary | ICD-10-CM | POA: Diagnosis not present

## 2012-12-29 DIAGNOSIS — IMO0001 Reserved for inherently not codable concepts without codable children: Secondary | ICD-10-CM | POA: Diagnosis not present

## 2012-12-29 DIAGNOSIS — M9981 Other biomechanical lesions of cervical region: Secondary | ICD-10-CM | POA: Diagnosis not present

## 2012-12-31 DIAGNOSIS — IMO0001 Reserved for inherently not codable concepts without codable children: Secondary | ICD-10-CM | POA: Diagnosis not present

## 2012-12-31 DIAGNOSIS — M999 Biomechanical lesion, unspecified: Secondary | ICD-10-CM | POA: Diagnosis not present

## 2012-12-31 DIAGNOSIS — M461 Sacroiliitis, not elsewhere classified: Secondary | ICD-10-CM | POA: Diagnosis not present

## 2012-12-31 DIAGNOSIS — IMO0002 Reserved for concepts with insufficient information to code with codable children: Secondary | ICD-10-CM | POA: Diagnosis not present

## 2012-12-31 DIAGNOSIS — M9981 Other biomechanical lesions of cervical region: Secondary | ICD-10-CM | POA: Diagnosis not present

## 2013-01-04 DIAGNOSIS — M9981 Other biomechanical lesions of cervical region: Secondary | ICD-10-CM | POA: Diagnosis not present

## 2013-01-04 DIAGNOSIS — IMO0001 Reserved for inherently not codable concepts without codable children: Secondary | ICD-10-CM | POA: Diagnosis not present

## 2013-01-04 DIAGNOSIS — IMO0002 Reserved for concepts with insufficient information to code with codable children: Secondary | ICD-10-CM | POA: Diagnosis not present

## 2013-01-04 DIAGNOSIS — M461 Sacroiliitis, not elsewhere classified: Secondary | ICD-10-CM | POA: Diagnosis not present

## 2013-01-04 DIAGNOSIS — M999 Biomechanical lesion, unspecified: Secondary | ICD-10-CM | POA: Diagnosis not present

## 2013-01-05 DIAGNOSIS — M461 Sacroiliitis, not elsewhere classified: Secondary | ICD-10-CM | POA: Diagnosis not present

## 2013-01-05 DIAGNOSIS — M999 Biomechanical lesion, unspecified: Secondary | ICD-10-CM | POA: Diagnosis not present

## 2013-01-05 DIAGNOSIS — IMO0002 Reserved for concepts with insufficient information to code with codable children: Secondary | ICD-10-CM | POA: Diagnosis not present

## 2013-01-05 DIAGNOSIS — IMO0001 Reserved for inherently not codable concepts without codable children: Secondary | ICD-10-CM | POA: Diagnosis not present

## 2013-01-05 DIAGNOSIS — M9981 Other biomechanical lesions of cervical region: Secondary | ICD-10-CM | POA: Diagnosis not present

## 2013-01-07 DIAGNOSIS — IMO0002 Reserved for concepts with insufficient information to code with codable children: Secondary | ICD-10-CM | POA: Diagnosis not present

## 2013-01-07 DIAGNOSIS — M461 Sacroiliitis, not elsewhere classified: Secondary | ICD-10-CM | POA: Diagnosis not present

## 2013-01-07 DIAGNOSIS — IMO0001 Reserved for inherently not codable concepts without codable children: Secondary | ICD-10-CM | POA: Diagnosis not present

## 2013-01-07 DIAGNOSIS — M9981 Other biomechanical lesions of cervical region: Secondary | ICD-10-CM | POA: Diagnosis not present

## 2013-01-07 DIAGNOSIS — M999 Biomechanical lesion, unspecified: Secondary | ICD-10-CM | POA: Diagnosis not present

## 2013-01-11 DIAGNOSIS — M999 Biomechanical lesion, unspecified: Secondary | ICD-10-CM | POA: Diagnosis not present

## 2013-01-11 DIAGNOSIS — H401490 Capsular glaucoma with pseudoexfoliation of lens, unspecified eye, stage unspecified: Secondary | ICD-10-CM | POA: Diagnosis not present

## 2013-01-11 DIAGNOSIS — M461 Sacroiliitis, not elsewhere classified: Secondary | ICD-10-CM | POA: Diagnosis not present

## 2013-01-11 DIAGNOSIS — IMO0001 Reserved for inherently not codable concepts without codable children: Secondary | ICD-10-CM | POA: Diagnosis not present

## 2013-01-11 DIAGNOSIS — IMO0002 Reserved for concepts with insufficient information to code with codable children: Secondary | ICD-10-CM | POA: Diagnosis not present

## 2013-01-11 DIAGNOSIS — M9981 Other biomechanical lesions of cervical region: Secondary | ICD-10-CM | POA: Diagnosis not present

## 2013-01-11 DIAGNOSIS — H409 Unspecified glaucoma: Secondary | ICD-10-CM | POA: Diagnosis not present

## 2013-01-12 DIAGNOSIS — M9981 Other biomechanical lesions of cervical region: Secondary | ICD-10-CM | POA: Diagnosis not present

## 2013-01-12 DIAGNOSIS — M999 Biomechanical lesion, unspecified: Secondary | ICD-10-CM | POA: Diagnosis not present

## 2013-01-12 DIAGNOSIS — IMO0002 Reserved for concepts with insufficient information to code with codable children: Secondary | ICD-10-CM | POA: Diagnosis not present

## 2013-01-12 DIAGNOSIS — M461 Sacroiliitis, not elsewhere classified: Secondary | ICD-10-CM | POA: Diagnosis not present

## 2013-01-12 DIAGNOSIS — IMO0001 Reserved for inherently not codable concepts without codable children: Secondary | ICD-10-CM | POA: Diagnosis not present

## 2013-01-14 DIAGNOSIS — IMO0001 Reserved for inherently not codable concepts without codable children: Secondary | ICD-10-CM | POA: Diagnosis not present

## 2013-01-14 DIAGNOSIS — M9981 Other biomechanical lesions of cervical region: Secondary | ICD-10-CM | POA: Diagnosis not present

## 2013-01-14 DIAGNOSIS — M461 Sacroiliitis, not elsewhere classified: Secondary | ICD-10-CM | POA: Diagnosis not present

## 2013-01-14 DIAGNOSIS — M999 Biomechanical lesion, unspecified: Secondary | ICD-10-CM | POA: Diagnosis not present

## 2013-01-14 DIAGNOSIS — IMO0002 Reserved for concepts with insufficient information to code with codable children: Secondary | ICD-10-CM | POA: Diagnosis not present

## 2013-01-18 DIAGNOSIS — IMO0001 Reserved for inherently not codable concepts without codable children: Secondary | ICD-10-CM | POA: Diagnosis not present

## 2013-01-18 DIAGNOSIS — IMO0002 Reserved for concepts with insufficient information to code with codable children: Secondary | ICD-10-CM | POA: Diagnosis not present

## 2013-01-18 DIAGNOSIS — M999 Biomechanical lesion, unspecified: Secondary | ICD-10-CM | POA: Diagnosis not present

## 2013-01-18 DIAGNOSIS — M9981 Other biomechanical lesions of cervical region: Secondary | ICD-10-CM | POA: Diagnosis not present

## 2013-01-18 DIAGNOSIS — M461 Sacroiliitis, not elsewhere classified: Secondary | ICD-10-CM | POA: Diagnosis not present

## 2013-01-19 DIAGNOSIS — M9981 Other biomechanical lesions of cervical region: Secondary | ICD-10-CM | POA: Diagnosis not present

## 2013-01-19 DIAGNOSIS — M999 Biomechanical lesion, unspecified: Secondary | ICD-10-CM | POA: Diagnosis not present

## 2013-01-19 DIAGNOSIS — IMO0002 Reserved for concepts with insufficient information to code with codable children: Secondary | ICD-10-CM | POA: Diagnosis not present

## 2013-01-19 DIAGNOSIS — M461 Sacroiliitis, not elsewhere classified: Secondary | ICD-10-CM | POA: Diagnosis not present

## 2013-01-19 DIAGNOSIS — IMO0001 Reserved for inherently not codable concepts without codable children: Secondary | ICD-10-CM | POA: Diagnosis not present

## 2013-01-21 DIAGNOSIS — M9981 Other biomechanical lesions of cervical region: Secondary | ICD-10-CM | POA: Diagnosis not present

## 2013-01-21 DIAGNOSIS — M999 Biomechanical lesion, unspecified: Secondary | ICD-10-CM | POA: Diagnosis not present

## 2013-01-21 DIAGNOSIS — IMO0002 Reserved for concepts with insufficient information to code with codable children: Secondary | ICD-10-CM | POA: Diagnosis not present

## 2013-01-21 DIAGNOSIS — M461 Sacroiliitis, not elsewhere classified: Secondary | ICD-10-CM | POA: Diagnosis not present

## 2013-01-21 DIAGNOSIS — IMO0001 Reserved for inherently not codable concepts without codable children: Secondary | ICD-10-CM | POA: Diagnosis not present

## 2013-01-25 DIAGNOSIS — IMO0002 Reserved for concepts with insufficient information to code with codable children: Secondary | ICD-10-CM | POA: Diagnosis not present

## 2013-01-25 DIAGNOSIS — M999 Biomechanical lesion, unspecified: Secondary | ICD-10-CM | POA: Diagnosis not present

## 2013-01-25 DIAGNOSIS — M9981 Other biomechanical lesions of cervical region: Secondary | ICD-10-CM | POA: Diagnosis not present

## 2013-01-25 DIAGNOSIS — IMO0001 Reserved for inherently not codable concepts without codable children: Secondary | ICD-10-CM | POA: Diagnosis not present

## 2013-01-25 DIAGNOSIS — M461 Sacroiliitis, not elsewhere classified: Secondary | ICD-10-CM | POA: Diagnosis not present

## 2013-01-26 DIAGNOSIS — M461 Sacroiliitis, not elsewhere classified: Secondary | ICD-10-CM | POA: Diagnosis not present

## 2013-01-26 DIAGNOSIS — IMO0002 Reserved for concepts with insufficient information to code with codable children: Secondary | ICD-10-CM | POA: Diagnosis not present

## 2013-01-26 DIAGNOSIS — IMO0001 Reserved for inherently not codable concepts without codable children: Secondary | ICD-10-CM | POA: Diagnosis not present

## 2013-01-26 DIAGNOSIS — M999 Biomechanical lesion, unspecified: Secondary | ICD-10-CM | POA: Diagnosis not present

## 2013-01-26 DIAGNOSIS — M9981 Other biomechanical lesions of cervical region: Secondary | ICD-10-CM | POA: Diagnosis not present

## 2013-01-28 DIAGNOSIS — IMO0002 Reserved for concepts with insufficient information to code with codable children: Secondary | ICD-10-CM | POA: Diagnosis not present

## 2013-01-28 DIAGNOSIS — M9981 Other biomechanical lesions of cervical region: Secondary | ICD-10-CM | POA: Diagnosis not present

## 2013-01-28 DIAGNOSIS — IMO0001 Reserved for inherently not codable concepts without codable children: Secondary | ICD-10-CM | POA: Diagnosis not present

## 2013-01-28 DIAGNOSIS — M461 Sacroiliitis, not elsewhere classified: Secondary | ICD-10-CM | POA: Diagnosis not present

## 2013-01-28 DIAGNOSIS — M999 Biomechanical lesion, unspecified: Secondary | ICD-10-CM | POA: Diagnosis not present

## 2013-02-02 DIAGNOSIS — IMO0002 Reserved for concepts with insufficient information to code with codable children: Secondary | ICD-10-CM | POA: Diagnosis not present

## 2013-02-02 DIAGNOSIS — M999 Biomechanical lesion, unspecified: Secondary | ICD-10-CM | POA: Diagnosis not present

## 2013-02-02 DIAGNOSIS — M9981 Other biomechanical lesions of cervical region: Secondary | ICD-10-CM | POA: Diagnosis not present

## 2013-02-02 DIAGNOSIS — IMO0001 Reserved for inherently not codable concepts without codable children: Secondary | ICD-10-CM | POA: Diagnosis not present

## 2013-02-02 DIAGNOSIS — M461 Sacroiliitis, not elsewhere classified: Secondary | ICD-10-CM | POA: Diagnosis not present

## 2013-02-04 DIAGNOSIS — IMO0001 Reserved for inherently not codable concepts without codable children: Secondary | ICD-10-CM | POA: Diagnosis not present

## 2013-02-04 DIAGNOSIS — IMO0002 Reserved for concepts with insufficient information to code with codable children: Secondary | ICD-10-CM | POA: Diagnosis not present

## 2013-02-04 DIAGNOSIS — M999 Biomechanical lesion, unspecified: Secondary | ICD-10-CM | POA: Diagnosis not present

## 2013-02-04 DIAGNOSIS — M9981 Other biomechanical lesions of cervical region: Secondary | ICD-10-CM | POA: Diagnosis not present

## 2013-02-04 DIAGNOSIS — M461 Sacroiliitis, not elsewhere classified: Secondary | ICD-10-CM | POA: Diagnosis not present

## 2013-02-08 DIAGNOSIS — M461 Sacroiliitis, not elsewhere classified: Secondary | ICD-10-CM | POA: Diagnosis not present

## 2013-02-08 DIAGNOSIS — IMO0001 Reserved for inherently not codable concepts without codable children: Secondary | ICD-10-CM | POA: Diagnosis not present

## 2013-02-08 DIAGNOSIS — M999 Biomechanical lesion, unspecified: Secondary | ICD-10-CM | POA: Diagnosis not present

## 2013-02-08 DIAGNOSIS — IMO0002 Reserved for concepts with insufficient information to code with codable children: Secondary | ICD-10-CM | POA: Diagnosis not present

## 2013-02-08 DIAGNOSIS — M9981 Other biomechanical lesions of cervical region: Secondary | ICD-10-CM | POA: Diagnosis not present

## 2013-02-11 DIAGNOSIS — M461 Sacroiliitis, not elsewhere classified: Secondary | ICD-10-CM | POA: Diagnosis not present

## 2013-02-11 DIAGNOSIS — M9981 Other biomechanical lesions of cervical region: Secondary | ICD-10-CM | POA: Diagnosis not present

## 2013-02-11 DIAGNOSIS — IMO0001 Reserved for inherently not codable concepts without codable children: Secondary | ICD-10-CM | POA: Diagnosis not present

## 2013-02-11 DIAGNOSIS — M999 Biomechanical lesion, unspecified: Secondary | ICD-10-CM | POA: Diagnosis not present

## 2013-02-11 DIAGNOSIS — IMO0002 Reserved for concepts with insufficient information to code with codable children: Secondary | ICD-10-CM | POA: Diagnosis not present

## 2013-02-16 DIAGNOSIS — M461 Sacroiliitis, not elsewhere classified: Secondary | ICD-10-CM | POA: Diagnosis not present

## 2013-02-16 DIAGNOSIS — IMO0001 Reserved for inherently not codable concepts without codable children: Secondary | ICD-10-CM | POA: Diagnosis not present

## 2013-02-16 DIAGNOSIS — IMO0002 Reserved for concepts with insufficient information to code with codable children: Secondary | ICD-10-CM | POA: Diagnosis not present

## 2013-02-16 DIAGNOSIS — M999 Biomechanical lesion, unspecified: Secondary | ICD-10-CM | POA: Diagnosis not present

## 2013-02-16 DIAGNOSIS — M9981 Other biomechanical lesions of cervical region: Secondary | ICD-10-CM | POA: Diagnosis not present

## 2013-02-18 DIAGNOSIS — M461 Sacroiliitis, not elsewhere classified: Secondary | ICD-10-CM | POA: Diagnosis not present

## 2013-02-18 DIAGNOSIS — M9981 Other biomechanical lesions of cervical region: Secondary | ICD-10-CM | POA: Diagnosis not present

## 2013-02-18 DIAGNOSIS — IMO0001 Reserved for inherently not codable concepts without codable children: Secondary | ICD-10-CM | POA: Diagnosis not present

## 2013-02-18 DIAGNOSIS — IMO0002 Reserved for concepts with insufficient information to code with codable children: Secondary | ICD-10-CM | POA: Diagnosis not present

## 2013-02-18 DIAGNOSIS — M999 Biomechanical lesion, unspecified: Secondary | ICD-10-CM | POA: Diagnosis not present

## 2013-02-22 DIAGNOSIS — M9981 Other biomechanical lesions of cervical region: Secondary | ICD-10-CM | POA: Diagnosis not present

## 2013-02-22 DIAGNOSIS — IMO0002 Reserved for concepts with insufficient information to code with codable children: Secondary | ICD-10-CM | POA: Diagnosis not present

## 2013-02-22 DIAGNOSIS — M461 Sacroiliitis, not elsewhere classified: Secondary | ICD-10-CM | POA: Diagnosis not present

## 2013-02-22 DIAGNOSIS — IMO0001 Reserved for inherently not codable concepts without codable children: Secondary | ICD-10-CM | POA: Diagnosis not present

## 2013-02-22 DIAGNOSIS — M999 Biomechanical lesion, unspecified: Secondary | ICD-10-CM | POA: Diagnosis not present

## 2013-03-23 DIAGNOSIS — IMO0001 Reserved for inherently not codable concepts without codable children: Secondary | ICD-10-CM | POA: Diagnosis not present

## 2013-03-23 DIAGNOSIS — M999 Biomechanical lesion, unspecified: Secondary | ICD-10-CM | POA: Diagnosis not present

## 2013-03-23 DIAGNOSIS — M461 Sacroiliitis, not elsewhere classified: Secondary | ICD-10-CM | POA: Diagnosis not present

## 2013-03-23 DIAGNOSIS — M9981 Other biomechanical lesions of cervical region: Secondary | ICD-10-CM | POA: Diagnosis not present

## 2013-03-23 DIAGNOSIS — IMO0002 Reserved for concepts with insufficient information to code with codable children: Secondary | ICD-10-CM | POA: Diagnosis not present

## 2013-04-01 DIAGNOSIS — IMO0002 Reserved for concepts with insufficient information to code with codable children: Secondary | ICD-10-CM | POA: Diagnosis not present

## 2013-04-01 DIAGNOSIS — M461 Sacroiliitis, not elsewhere classified: Secondary | ICD-10-CM | POA: Diagnosis not present

## 2013-04-01 DIAGNOSIS — M9981 Other biomechanical lesions of cervical region: Secondary | ICD-10-CM | POA: Diagnosis not present

## 2013-04-01 DIAGNOSIS — IMO0001 Reserved for inherently not codable concepts without codable children: Secondary | ICD-10-CM | POA: Diagnosis not present

## 2013-04-01 DIAGNOSIS — M999 Biomechanical lesion, unspecified: Secondary | ICD-10-CM | POA: Diagnosis not present

## 2013-04-08 DIAGNOSIS — M999 Biomechanical lesion, unspecified: Secondary | ICD-10-CM | POA: Diagnosis not present

## 2013-04-08 DIAGNOSIS — IMO0001 Reserved for inherently not codable concepts without codable children: Secondary | ICD-10-CM | POA: Diagnosis not present

## 2013-04-08 DIAGNOSIS — M9981 Other biomechanical lesions of cervical region: Secondary | ICD-10-CM | POA: Diagnosis not present

## 2013-04-08 DIAGNOSIS — M461 Sacroiliitis, not elsewhere classified: Secondary | ICD-10-CM | POA: Diagnosis not present

## 2013-04-08 DIAGNOSIS — IMO0002 Reserved for concepts with insufficient information to code with codable children: Secondary | ICD-10-CM | POA: Diagnosis not present

## 2013-04-15 DIAGNOSIS — IMO0001 Reserved for inherently not codable concepts without codable children: Secondary | ICD-10-CM | POA: Diagnosis not present

## 2013-04-15 DIAGNOSIS — M461 Sacroiliitis, not elsewhere classified: Secondary | ICD-10-CM | POA: Diagnosis not present

## 2013-04-15 DIAGNOSIS — IMO0002 Reserved for concepts with insufficient information to code with codable children: Secondary | ICD-10-CM | POA: Diagnosis not present

## 2013-04-15 DIAGNOSIS — M999 Biomechanical lesion, unspecified: Secondary | ICD-10-CM | POA: Diagnosis not present

## 2013-04-15 DIAGNOSIS — M9981 Other biomechanical lesions of cervical region: Secondary | ICD-10-CM | POA: Diagnosis not present

## 2013-04-23 DIAGNOSIS — H401490 Capsular glaucoma with pseudoexfoliation of lens, unspecified eye, stage unspecified: Secondary | ICD-10-CM | POA: Diagnosis not present

## 2013-04-23 DIAGNOSIS — H409 Unspecified glaucoma: Secondary | ICD-10-CM | POA: Diagnosis not present

## 2013-04-23 DIAGNOSIS — H4011X0 Primary open-angle glaucoma, stage unspecified: Secondary | ICD-10-CM | POA: Diagnosis not present

## 2013-04-29 DIAGNOSIS — IMO0002 Reserved for concepts with insufficient information to code with codable children: Secondary | ICD-10-CM | POA: Diagnosis not present

## 2013-04-29 DIAGNOSIS — M461 Sacroiliitis, not elsewhere classified: Secondary | ICD-10-CM | POA: Diagnosis not present

## 2013-04-29 DIAGNOSIS — M999 Biomechanical lesion, unspecified: Secondary | ICD-10-CM | POA: Diagnosis not present

## 2013-04-29 DIAGNOSIS — IMO0001 Reserved for inherently not codable concepts without codable children: Secondary | ICD-10-CM | POA: Diagnosis not present

## 2013-04-29 DIAGNOSIS — M9981 Other biomechanical lesions of cervical region: Secondary | ICD-10-CM | POA: Diagnosis not present

## 2013-05-13 DIAGNOSIS — IMO0001 Reserved for inherently not codable concepts without codable children: Secondary | ICD-10-CM | POA: Diagnosis not present

## 2013-05-13 DIAGNOSIS — IMO0002 Reserved for concepts with insufficient information to code with codable children: Secondary | ICD-10-CM | POA: Diagnosis not present

## 2013-05-13 DIAGNOSIS — M461 Sacroiliitis, not elsewhere classified: Secondary | ICD-10-CM | POA: Diagnosis not present

## 2013-05-13 DIAGNOSIS — M999 Biomechanical lesion, unspecified: Secondary | ICD-10-CM | POA: Diagnosis not present

## 2013-05-13 DIAGNOSIS — M9981 Other biomechanical lesions of cervical region: Secondary | ICD-10-CM | POA: Diagnosis not present

## 2013-05-27 DIAGNOSIS — M9981 Other biomechanical lesions of cervical region: Secondary | ICD-10-CM | POA: Diagnosis not present

## 2013-05-27 DIAGNOSIS — IMO0002 Reserved for concepts with insufficient information to code with codable children: Secondary | ICD-10-CM | POA: Diagnosis not present

## 2013-05-27 DIAGNOSIS — M999 Biomechanical lesion, unspecified: Secondary | ICD-10-CM | POA: Diagnosis not present

## 2013-05-27 DIAGNOSIS — M461 Sacroiliitis, not elsewhere classified: Secondary | ICD-10-CM | POA: Diagnosis not present

## 2013-05-27 DIAGNOSIS — IMO0001 Reserved for inherently not codable concepts without codable children: Secondary | ICD-10-CM | POA: Diagnosis not present

## 2013-06-09 DIAGNOSIS — IMO0001 Reserved for inherently not codable concepts without codable children: Secondary | ICD-10-CM | POA: Diagnosis not present

## 2013-06-09 DIAGNOSIS — M461 Sacroiliitis, not elsewhere classified: Secondary | ICD-10-CM | POA: Diagnosis not present

## 2013-06-09 DIAGNOSIS — IMO0002 Reserved for concepts with insufficient information to code with codable children: Secondary | ICD-10-CM | POA: Diagnosis not present

## 2013-06-09 DIAGNOSIS — M9981 Other biomechanical lesions of cervical region: Secondary | ICD-10-CM | POA: Diagnosis not present

## 2013-06-09 DIAGNOSIS — M999 Biomechanical lesion, unspecified: Secondary | ICD-10-CM | POA: Diagnosis not present

## 2013-06-24 DIAGNOSIS — M999 Biomechanical lesion, unspecified: Secondary | ICD-10-CM | POA: Diagnosis not present

## 2013-06-24 DIAGNOSIS — M461 Sacroiliitis, not elsewhere classified: Secondary | ICD-10-CM | POA: Diagnosis not present

## 2013-06-24 DIAGNOSIS — IMO0001 Reserved for inherently not codable concepts without codable children: Secondary | ICD-10-CM | POA: Diagnosis not present

## 2013-06-24 DIAGNOSIS — IMO0002 Reserved for concepts with insufficient information to code with codable children: Secondary | ICD-10-CM | POA: Diagnosis not present

## 2013-06-24 DIAGNOSIS — M9981 Other biomechanical lesions of cervical region: Secondary | ICD-10-CM | POA: Diagnosis not present

## 2013-06-30 ENCOUNTER — Encounter: Payer: Self-pay | Admitting: Internal Medicine

## 2013-07-01 ENCOUNTER — Ambulatory Visit (INDEPENDENT_AMBULATORY_CARE_PROVIDER_SITE_OTHER): Payer: Medicare Other | Admitting: Internal Medicine

## 2013-07-01 ENCOUNTER — Encounter: Payer: Self-pay | Admitting: Internal Medicine

## 2013-07-01 ENCOUNTER — Other Ambulatory Visit: Payer: Self-pay | Admitting: Internal Medicine

## 2013-07-01 VITALS — BP 156/98 | HR 84 | Temp 98.1°F | Resp 18 | Wt 133.6 lb

## 2013-07-01 DIAGNOSIS — E782 Mixed hyperlipidemia: Secondary | ICD-10-CM | POA: Diagnosis not present

## 2013-07-01 DIAGNOSIS — Z79899 Other long term (current) drug therapy: Secondary | ICD-10-CM

## 2013-07-01 DIAGNOSIS — E559 Vitamin D deficiency, unspecified: Secondary | ICD-10-CM | POA: Insufficient documentation

## 2013-07-01 DIAGNOSIS — I10 Essential (primary) hypertension: Secondary | ICD-10-CM

## 2013-07-01 DIAGNOSIS — R7309 Other abnormal glucose: Secondary | ICD-10-CM | POA: Diagnosis not present

## 2013-07-01 LAB — BASIC METABOLIC PANEL WITH GFR
BUN: 17 mg/dL (ref 6–23)
CO2: 27 mEq/L (ref 19–32)
Chloride: 101 mEq/L (ref 96–112)
Creat: 0.66 mg/dL (ref 0.50–1.10)
GFR, Est Non African American: 81 mL/min
Glucose, Bld: 100 mg/dL — ABNORMAL HIGH (ref 70–99)
Sodium: 138 mEq/L (ref 135–145)

## 2013-07-01 LAB — HEPATIC FUNCTION PANEL
ALT: 18 U/L (ref 0–35)
AST: 18 U/L (ref 0–37)
Albumin: 4.3 g/dL (ref 3.5–5.2)
Alkaline Phosphatase: 38 U/L — ABNORMAL LOW (ref 39–117)
Bilirubin, Direct: 0.1 mg/dL (ref 0.0–0.3)
Indirect Bilirubin: 0.4 mg/dL (ref 0.0–0.9)
Total Bilirubin: 0.5 mg/dL (ref 0.3–1.2)
Total Protein: 7 g/dL (ref 6.0–8.3)

## 2013-07-01 LAB — CBC WITH DIFFERENTIAL/PLATELET
Basophils Absolute: 0.1 10*3/uL (ref 0.0–0.1)
Basophils Relative: 1 % (ref 0–1)
Eosinophils Absolute: 0.2 10*3/uL (ref 0.0–0.7)
Eosinophils Relative: 3 % (ref 0–5)
HCT: 43.3 % (ref 36.0–46.0)
Hemoglobin: 15.2 g/dL — ABNORMAL HIGH (ref 12.0–15.0)
Lymphocytes Relative: 22 % (ref 12–46)
Lymphs Abs: 1.5 10*3/uL (ref 0.7–4.0)
MCH: 31.3 pg (ref 26.0–34.0)
MCHC: 35.1 g/dL (ref 30.0–36.0)
MCV: 89.3 fL (ref 78.0–100.0)
Monocytes Absolute: 0.9 10*3/uL (ref 0.1–1.0)
Monocytes Relative: 13 % — ABNORMAL HIGH (ref 3–12)
Neutro Abs: 4.3 10*3/uL (ref 1.7–7.7)
Neutrophils Relative %: 61 % (ref 43–77)
Platelets: 346 10*3/uL (ref 150–400)
RBC: 4.85 MIL/uL (ref 3.87–5.11)
RDW: 13.9 % (ref 11.5–15.5)

## 2013-07-01 LAB — LIPID PANEL
Cholesterol: 197 mg/dL (ref 0–200)
HDL: 52 mg/dL (ref >39)
LDL Cholesterol: 112 mg/dL — ABNORMAL HIGH (ref 0–99)
Triglycerides: 164 mg/dL — ABNORMAL HIGH (ref <150)
VLDL: 33 mg/dL (ref 0–40)

## 2013-07-01 LAB — HEMOGLOBIN A1C: Mean Plasma Glucose: 123 mg/dL — ABNORMAL HIGH (ref <117)

## 2013-07-01 LAB — TSH: TSH: 2.341 u[IU]/mL (ref 0.350–4.500)

## 2013-07-01 NOTE — Progress Notes (Signed)
Patient ID: Leslie [name], female   DOB: 1927-04-11, 79 y.o.   MRN: [id number]   This very nice  79 yo MWF presents for 3 month follow up with Hypertension, Hyperlipidemia, Pre-Diabetes and Vitamin D Deficiency.    BP has been controlled at home. Today's BP is BP: 156/98 mmHg, but she relates home BP's are always normal and was 130/68 this am at home . Patient denies any cardiac type chest pain, palpitations, dyspnea/orthopnea/PND, dizziness, claudication, or dependent edema.   Hyperlipidemia is controlled with diet & supplements. Last Cholesterol was  179, Triglycerides were 204, HDL 50 and LDL 88. Patient denies myalgias or other med SE's.    Also, the patient has history of PreDiabetes with last A1c of 5.3% in June (was 5.7% in August 2013). Patient denies any symptoms of reactive hypoglycemia, diabetic polys, paresthesias or visual blurring.   Further, Patient has history of Vitamin D Deficiency with last vitamin D of 72 in June. Patient supplements vitamin D without any suspected side-effects.  Medication Sig Dispense Refill  . Cholecalciferol (VITAMIN D-3) 1000 UNITS CAPS Take 2,000 Units by mouth.      . estradiol (ESTRACE) 1 MG tablet Take 1 mg by mouth daily.      . Flaxseed, Linseed, (FLAXSEED OIL) 1000 MG CAPS Take 1,000 mg by mouth daily.      . magnesium oxide (MAG-OX) 400 MG tablet Take 400 mg by mouth daily.      . Multiple Vitamin (MULTIVITAMIN) tablet Take 1 tablet by mouth daily.      . traMADol (ULTRAM) 50 MG tablet Take 25 mg by mouth every 6 (six) hours as needed.         Allergies  Allergen Reactions  . Adhesive [Tape]   . Advil [Ibuprofen]   . Atenolol   . Titanium     PMHx:   Past Medical History  Diagnosis Date  . Chest pain   . Essential hypertension, benign   . Other left bundle branch block   . Other premature beats   . GERD (gastroesophageal reflux disease)   . Hyperlipidemia   . Pre-diabetes   . DDD (degenerative disc disease)   . OA (osteoarthritis)      FHx:    Reviewed / unchanged  SHx:    Reviewed / unchanged  Systems Review: Constitutional: Denies fever, chills, wt changes, headaches, insomnia, fatigue, night sweats, change in appetite. Eyes: Denies redness, blurred vision, diplopia, discharge, itchy, watery eyes.  ENT: Denies discharge, congestion, post nasal drip, epistaxis, sore throat, earache, hearing loss, dental pain, tinnitus, vertigo, sinus pain, snoring.  CV: Denies chest pain, palpitations, irregular heartbeat, syncope, dyspnea, diaphoresis, orthopnea, PND, claudication, edema. Respiratory: denies cough, dyspnea, DOE, pleurisy, hoarseness, laryngitis, wheezing.  Gastrointestinal: Denies dysphagia, odynophagia, heartburn, reflux, water brash, abdominal pain or cramps, nausea, vomiting, bloating, diarrhea, constipation, hematemesis, melena, hematochezia,  or hemorrhoids. Genitourinary: Denies dysuria, frequency, urgency, nocturia, hesitancy, discharge, hematuria, flank pain. Musculoskeletal: Denies arthralgias, myalgias, stiffness, jt. swelling, pain, limp, strain/sprain.  Skin: Denies pruritus, rash, hives, warts, acne, eczema, change in skin lesion(s). Neuro: No weakness, tremor, incoordination, spasms, paresthesia, or pain. Psychiatric: Denies confusion, memory loss, or sensory loss. Endo: Denies change in weight, skin, hair change.  Heme/Lymph: No excessive bleeding, bruising, orenlarged lymph nodes.  Filed Vitals:   07/01/13 1125  BP: 156/98  Pulse: 84  Temp: 98.1 F (36.7 C)  Resp: 18    Estimated body mass index is 24.43 kg/(m^2) as calculated from the following:  Height as of 03/10/12: 5\' 2"  (1.575 m).   Weight as of this encounter: 133 lb 9.6 oz (60.601 kg).  On Exam: Appears well nourished - in no distress. Eyes: PERRLA, EOMs, conjunctiva no swelling or erythema. Sinuses: No frontal/maxillary tenderness ENT/Mouth: EAC's clear, TM's nl w/o erythema, bulging. Nares clear w/o erythema, swelling,  exudates. Oropharynx clear without erythema or exudates. Oral hygiene is good. Tongue normal, non obstructing. Hearing intact.  Neck: Supple. Thyroid nl. Car 2+/2+ without bruits, nodes or JVD. Chest: Respirations nl with BS clear & equal w/o rales, rhonchi, wheezing or stridor.  Cor: Heart sounds normal w/ regular rate and rhythm without sig. murmurs, gallops, clicks, or rubs. Peripheral pulses normal and equal  without edema.  Abdomen: Soft & bowel sounds normal. Non-tender w/o guarding, rebound, hernias, masses, or organomegaly.  Lymphatics: Unremarkable.  Musculoskeletal: Full ROM all peripheral extremities, joint stability, 5/5 strength, and normal gait.  Skin: Warm, dry without exposed rashes, lesions, ecchymosis apparent.  Neuro: Cranial nerves intact, reflexes equal bilaterally. Sensory-motor testing grossly intact. Tendon reflexes grossly intact.  Pysch: Alert & oriented x 3. Insight and judgement nl & appropriate. No ideations.  Assessment and Plan:  1. Hypertension - Continue monitor blood pressure at home. Continue diet/meds same.  2. Hyperlipidemia - Continue diet, exercise,& lifestyle modifications. Continue monitor periodic cholesterol/liver & renal functions   3. Pre-diabetes - Continue diet, exercise, lifestyle modifications. Monitor appropriate labs.  4. Vitamin D Deficiency - Continue supplementation.  Recommended regular exercise, BP monitoring, weight control, and discussed med and SE's. Recommended labs to assess and monitor clinical status. Further disposition pending results of labs.

## 2013-07-01 NOTE — Patient Instructions (Signed)

## 2013-07-02 LAB — INSULIN, FASTING: Insulin fasting, serum: 22 u[IU]/mL (ref 3–28)

## 2013-07-03 LAB — VITAMIN D 25 HYDROXY (VIT D DEFICIENCY, FRACTURES): Vit D, 25-Hydroxy: 47 ng/mL (ref 30–89)

## 2013-07-14 DIAGNOSIS — IMO0002 Reserved for concepts with insufficient information to code with codable children: Secondary | ICD-10-CM | POA: Diagnosis not present

## 2013-07-14 DIAGNOSIS — M9981 Other biomechanical lesions of cervical region: Secondary | ICD-10-CM | POA: Diagnosis not present

## 2013-07-14 DIAGNOSIS — IMO0001 Reserved for inherently not codable concepts without codable children: Secondary | ICD-10-CM | POA: Diagnosis not present

## 2013-07-14 DIAGNOSIS — M461 Sacroiliitis, not elsewhere classified: Secondary | ICD-10-CM | POA: Diagnosis not present

## 2013-07-14 DIAGNOSIS — M999 Biomechanical lesion, unspecified: Secondary | ICD-10-CM | POA: Diagnosis not present

## 2013-07-21 ENCOUNTER — Other Ambulatory Visit: Payer: Self-pay | Admitting: Internal Medicine

## 2013-07-21 DIAGNOSIS — F411 Generalized anxiety disorder: Secondary | ICD-10-CM

## 2013-07-21 MED ORDER — LORAZEPAM 1 MG PO TABS: ORAL_TABLET | ORAL | Status: DC

## 2013-08-04 DIAGNOSIS — M461 Sacroiliitis, not elsewhere classified: Secondary | ICD-10-CM | POA: Diagnosis not present

## 2013-08-04 DIAGNOSIS — M9981 Other biomechanical lesions of cervical region: Secondary | ICD-10-CM | POA: Diagnosis not present

## 2013-08-04 DIAGNOSIS — IMO0002 Reserved for concepts with insufficient information to code with codable children: Secondary | ICD-10-CM | POA: Diagnosis not present

## 2013-08-04 DIAGNOSIS — M999 Biomechanical lesion, unspecified: Secondary | ICD-10-CM | POA: Diagnosis not present

## 2013-08-04 DIAGNOSIS — IMO0001 Reserved for inherently not codable concepts without codable children: Secondary | ICD-10-CM | POA: Diagnosis not present

## 2013-09-01 DIAGNOSIS — IMO0002 Reserved for concepts with insufficient information to code with codable children: Secondary | ICD-10-CM | POA: Diagnosis not present

## 2013-09-01 DIAGNOSIS — M461 Sacroiliitis, not elsewhere classified: Secondary | ICD-10-CM | POA: Diagnosis not present

## 2013-09-01 DIAGNOSIS — IMO0001 Reserved for inherently not codable concepts without codable children: Secondary | ICD-10-CM | POA: Diagnosis not present

## 2013-09-01 DIAGNOSIS — M9981 Other biomechanical lesions of cervical region: Secondary | ICD-10-CM | POA: Diagnosis not present

## 2013-09-01 DIAGNOSIS — M999 Biomechanical lesion, unspecified: Secondary | ICD-10-CM | POA: Diagnosis not present

## 2013-09-23 DIAGNOSIS — H4011X0 Primary open-angle glaucoma, stage unspecified: Secondary | ICD-10-CM | POA: Diagnosis not present

## 2013-09-23 DIAGNOSIS — H409 Unspecified glaucoma: Secondary | ICD-10-CM | POA: Diagnosis not present

## 2013-09-29 DIAGNOSIS — IMO0001 Reserved for inherently not codable concepts without codable children: Secondary | ICD-10-CM | POA: Diagnosis not present

## 2013-09-29 DIAGNOSIS — IMO0002 Reserved for concepts with insufficient information to code with codable children: Secondary | ICD-10-CM | POA: Diagnosis not present

## 2013-09-29 DIAGNOSIS — M461 Sacroiliitis, not elsewhere classified: Secondary | ICD-10-CM | POA: Diagnosis not present

## 2013-09-29 DIAGNOSIS — M999 Biomechanical lesion, unspecified: Secondary | ICD-10-CM | POA: Diagnosis not present

## 2013-09-29 DIAGNOSIS — M9981 Other biomechanical lesions of cervical region: Secondary | ICD-10-CM | POA: Diagnosis not present

## 2013-10-05 DIAGNOSIS — M461 Sacroiliitis, not elsewhere classified: Secondary | ICD-10-CM | POA: Diagnosis not present

## 2013-10-05 DIAGNOSIS — M9981 Other biomechanical lesions of cervical region: Secondary | ICD-10-CM | POA: Diagnosis not present

## 2013-10-05 DIAGNOSIS — IMO0001 Reserved for inherently not codable concepts without codable children: Secondary | ICD-10-CM | POA: Diagnosis not present

## 2013-10-05 DIAGNOSIS — M999 Biomechanical lesion, unspecified: Secondary | ICD-10-CM | POA: Diagnosis not present

## 2013-10-05 DIAGNOSIS — IMO0002 Reserved for concepts with insufficient information to code with codable children: Secondary | ICD-10-CM | POA: Diagnosis not present

## 2013-10-06 ENCOUNTER — Ambulatory Visit (INDEPENDENT_AMBULATORY_CARE_PROVIDER_SITE_OTHER): Payer: Medicare Other | Admitting: Physician Assistant

## 2013-10-06 ENCOUNTER — Encounter: Payer: Self-pay | Admitting: Physician Assistant

## 2013-10-06 VITALS — BP 142/80 | HR 80 | Temp 97.7°F | Resp 16 | Wt 135.0 lb

## 2013-10-06 DIAGNOSIS — Z1331 Encounter for screening for depression: Secondary | ICD-10-CM | POA: Diagnosis not present

## 2013-10-06 DIAGNOSIS — F3289 Other specified depressive episodes: Secondary | ICD-10-CM | POA: Diagnosis not present

## 2013-10-06 DIAGNOSIS — E782 Mixed hyperlipidemia: Secondary | ICD-10-CM

## 2013-10-06 DIAGNOSIS — F329 Major depressive disorder, single episode, unspecified: Secondary | ICD-10-CM

## 2013-10-06 DIAGNOSIS — Z0000 Encounter for general adult medical examination without abnormal findings: Secondary | ICD-10-CM

## 2013-10-06 DIAGNOSIS — E559 Vitamin D deficiency, unspecified: Secondary | ICD-10-CM

## 2013-10-06 DIAGNOSIS — I10 Essential (primary) hypertension: Secondary | ICD-10-CM | POA: Diagnosis not present

## 2013-10-06 DIAGNOSIS — R7309 Other abnormal glucose: Secondary | ICD-10-CM

## 2013-10-06 NOTE — Progress Notes (Signed)
Subjective:   Leslie [name] is a 80 y.o. female who presents for Medicare Annual Wellness Visit and 3 month follow up on hypertension, prediabetes, hyperlipidemia, vitamin D def.  Date of last medicare wellness visit is unknown.   Her blood pressure has been controlled at home, today their BP is BP: 142/80 mmHg She does workout, she walks but states that her back pain sometimes limits that, and during the summer she works at [employer]. She denies chest pain, shortness of breath, dizziness.  She is on cholesterol medication and denies myalgias. Her cholesterol is at goal. The cholesterol last visit was:   Lab Results  Component Value Date   CHOL 197 07/01/2013   HDL 52 07/01/2013   LDLCALC 112* 07/01/2013   TRIG 164* 07/01/2013   CHOLHDL 3.8 07/01/2013   She has been working on diet and exercise for prediabetes, and denies foot ulcerations, nausea, paresthesia of the feet, polydipsia and polyuria. Last A1C in the office was:  Lab Results  Component Value Date   HGBA1C 5.9* 07/01/2013   Patient is on Vitamin D supplement. She has glacoma and states that since her doctor changed her drops she has not been sleeping as well, she does the drops at night and she she been waking up at 3 AM and unable to go back to sleep.   Names of Other Physician/Practitioners you currently use: 1. [facility]- here for primary care 2. [facility], [place], eye doctor, last visit 08/2013 3. Retired Education officer, communitydentist, Education officer, communitydentist, last visit unknown Patient Care Team: Lucky CowboyWilliam McKeown, MD as PCP - General (Internal Medicine)  Medication Review Current Outpatient Prescriptions on File Prior to Visit  Medication Sig Dispense Refill  . Cholecalciferol (VITAMIN D-3) 1000 UNITS CAPS Take 2,000 Units by mouth.      . estradiol (ESTRACE) 1 MG tablet Take 1 mg by mouth daily.      . Flaxseed, Linseed, (FLAXSEED OIL) 1000 MG CAPS Take 1,000 mg by mouth daily.      Marland Kitchen. LORazepam  (ATIVAN) 1 MG tablet 1/2 to 1  tab up to 3 x daily as needed for anxiety or sleep  270 tablet  1  . magnesium oxide (MAG-OX) 400 MG tablet Take 400 mg by mouth daily.      . Multiple Vitamin (MULTIVITAMIN) tablet Take 1 tablet by mouth daily.      . traMADol (ULTRAM) 50 MG tablet Take 25 mg by mouth every 6 (six) hours as needed.       No current facility-administered medications on file prior to visit.    Current Problems (verified) Patient Active Problem List   Diagnosis Date Noted  . Unspecified vitamin D deficiency 07/01/2013  . Other abnormal glucose 07/01/2013  . Mixed hyperlipidemia 07/01/2013  . Unspecified essential hypertension 07/01/2013  . ESSENTIAL HYPERTENSION, BENIGN 02/20/2010  . LBBB 02/20/2010  . PREMATURE VENTRICULAR CONTRACTIONS, FREQUENT 02/20/2010  . CHEST PAIN 02/20/2010    Screening Tests Health Maintenance  Topic Date Due  . Tetanus/tdap  [birth year]17/1947  . Colonoscopy  07/16/1978  . Influenza Vaccine  02/12/2013  . Pneumococcal Polysaccharide Vaccine Age 78 And Over  Completed  . Zostavax  Completed     Immunization History  Administered Date(s) Administered  . DTaP 05/06/2003  . Pneumococcal Polysaccharide-23 05/06/2003  . Zoster 07/16/2007    Preventative care: Last colonoscopy: declines Last mammogram: 2012 Last pap smear/pelvic exam: 2004 DEXA: 2008 declines  Prior vaccinations: TD or Tdap: 2004 declines  Influenza: declines  Pneumococcal: 2004 Shingles/Zostavax: 2009  History reviewed: allergies, current medications, past family history, past medical history, past social history, past surgical history and problem list  Risk Factors: Osteoporosis: postmenopausal estrogen deficiency and dietary calcium and/or vitamin D deficiency History of fracture in the past year: no  Tobacco History  Substance Use Topics  . Smoking status: Former Smoker    Quit date: 07/15/1978  . Smokeless tobacco: Not on file  . Alcohol Use: No   She does  not smoke.  Patient is a former smoker. Are there smokers in your home (other than you)?  No  Alcohol Current alcohol use: none  Caffeine Current caffeine use: coffee 1 /day  Exercise Exercise limitations: The patient has no exercise limitations. Current exercise: gardening and walking  Nutrition/Diet Current diet: in general, a "healthy" diet    Cardiac risk factors: advanced age (older than 36 for men, 67 for women), dyslipidemia, hypertension and sedentary lifestyle.  Depression Screen Nurse depression screen reviewed.  (Note: if answer to either of the following is "Yes", a more complete depression screening is indicated)   Q1: Over the past two weeks, have you felt down, depressed or hopeless? No  Q2: Over the past two weeks, have you felt little interest or pleasure in doing things? No  Have you lost interest or pleasure in daily life? No  Do you often feel hopeless? No  Do you cry easily over simple problems? No  Activities of Daily Living Nurse ADLs screen reviewed.  In your present state of health, do you have any difficulty performing the following activities?:  Driving? No Managing money?  No Feeding yourself? No Getting from bed to chair? No Climbing a flight of stairs? No Preparing food and eating?: No Bathing or showering? No Getting dressed: No Getting to the toilet? No Using the toilet:No Moving around from place to place: No In the past year have you fallen or had a near fall?:No   Are you sexually active?  No  Do you have more than one partner?  No  Vision Difficulties: Yes  Hearing Difficulties: No Do you often ask people to speak up or repeat themselves? No Do you experience ringing or noises in your ears? No Do you have difficulty understanding soft or whispered voices? No  Cognition  Do you feel that you have a problem with memory?No  Do you often misplace items? No  Do you feel safe at home?  Yes  Advanced directives Does patient  have a Health Care Power of Attorney? No Does patient have a Living Will? No    Objective:     Vision and hearing screens reviewed.   Blood pressure 142/80, pulse 80, temperature 97.7 F (36.5 C), resp. rate 16, weight 135 lb (61.236 kg). Body mass index is 24.69 kg/(m^2).  General appearance: alert, no distress, WD/WN,  female Cognitive Testing  Alert? Yes  Normal Appearance?Yes  Oriented to person? Yes  Place? Yes   Time? Yes  Recall of three objects?  Yes  Can perform simple calculations? Yes  Displays appropriate judgment?Yes  Can read the correct time from a watch face?Yes  HEENT: normocephalic, sclerae anicteric, TMs pearly, nares patent, no discharge or erythema, pharynx normal Oral cavity: MMM, no lesions Neck: supple, no lymphadenopathy, no thyromegaly, no masses Heart: RRR, normal S1, S2, no murmurs Lungs: CTA bilaterally, no wheezes, rhonchi, or rales Abdomen: +bs, soft, non tender, non distended, no masses, no hepatomegaly, no splenomegaly Musculoskeletal: nontender, no swelling, no obvious deformity Extremities:  no edema, no cyanosis, no clubbing Pulses: 2+ symmetric, upper and lower extremities, normal cap refill Neurological: alert, oriented x 3, CN2-12 intact, strength normal upper extremities and lower extremities, sensation normal throughout, DTRs 2+ throughout, no cerebellar signs, gait normal Psychiatric: normal affect, behavior normal, pleasant  Breast: nontender, no masses or lumps, no skin changes, no nipple discharge or inversion, no axillary lymphadenopathy Gyn: Normal external genitalia without lesions, vagina with normal mucosa, cervix without lesions, no cervical motion tenderness, no abnormal vaginal discharge.  Uterus and adnexa not enlarged, nontender, no masses.  Pap performed.   Rectal:    Assessment:   1. Essential hypertension, benign  2. Unspecified vitamin D deficiency  3. Other abnormal glucose  4. Mixed hyperlipidemia   Plan:    During the course of the visit the patient was educated and counseled about appropriate screening and preventive services including:    Pneumococcal vaccine   Influenza vaccine  Td vaccine  Screening electrocardiogram  Screening mammography  Colorectal cancer screening  Diabetes screening  Glaucoma screening  Nutrition counseling   Advanced directives: given information but states not interested  Screening recommendations, referrals:  Vaccinations: Tdap vaccine not done  Influenza vaccine not done Pneumococcal vaccine not done Shingles vaccine not done Hep B vaccine not done  Nutrition assessed and recommended  Colonoscopy no Mammogram no Pap smear no Pelvic exam no Recommended yearly ophthalmology/optometry visit for glaucoma screening and checkup Recommended yearly dental visit for hygiene and checkup Advanced directives - yes  Conditions/risks identified: BMI: Discussed weight loss, diet, and increase physical activity.  Increase physical activity: AHA recommends 150 minutes of physical activity a week.  Medications reviewed DEXA- declined Diabetes is at goal, no DM, only predm, no need for ACE- and patient declines Urinary Incontinence is not an issue: discussed non pharmacology and pharmacology options.  Fall risk: low- moderate- discussed medications that put her at risk and her vision, she understands- discussed PT, home fall assessment, medications.   Medicare Attestation I have personally reviewed: The patient's medical and social history Their use of alcohol, tobacco or illicit drugs Their current medications and supplements The patient's functional ability including ADLs,fall risks, home safety risks, cognitive, and hearing and visual impairment Diet and physical activities Evidence for depression or mood disorders  The patient's weight, height, BMI, and visual acuity have been recorded in the chart.  I have made referrals, counseling, and  provided education to the patient based on review of the above and I have provided the patient with a written personalized care plan for preventive services.     Quentin Mulling, PA-C   10/06/2013

## 2013-10-06 NOTE — Patient Instructions (Signed)
Preventative Care for Adults - Female      MAINTAIN REGULAR HEALTH EXAMS:  A routine yearly physical is a good way to check in with your primary care provider about your health and preventive screening. It is also an opportunity to share updates about your health and any concerns you have, and receive a thorough all-over exam.   Most health insurance companies pay for at least some preventative services.  Check with your health plan for specific coverages.  WHAT PREVENTATIVE SERVICES DO WOMEN NEED?  Adult women should have their weight and blood pressure checked regularly.   Women age 35 and older should have their cholesterol levels checked regularly.  Women should be screened for cervical cancer with a Pap smear and pelvic exam beginning at either age 21, or 3 years after they become sexually activity.    Breast cancer screening generally begins at age 40 with a mammogram and breast exam by your primary care provider.    Beginning at age 50 and continuing to age 75, women should be screened for colorectal cancer.  Certain people may need continued testing until age 85.  Updating vaccinations is part of preventative care.  Vaccinations help protect against diseases such as the flu.  Osteoporosis is a disease in which the bones lose minerals and strength as we age. Women ages 65 and over should discuss this with their caregivers, as should women after menopause who have other risk factors.  Lab tests are generally done as part of preventative care to screen for anemia and blood disorders, to screen for problems with the kidneys and liver, to screen for bladder problems, to check blood sugar, and to check your cholesterol level.  Preventative services generally include counseling about diet, exercise, avoiding tobacco, drugs, excessive alcohol consumption, and sexually transmitted infections.    GENERAL RECOMMENDATIONS FOR GOOD HEALTH:  Healthy diet:  Eat a variety of foods, including  fruit, vegetables, animal or vegetable protein, such as meat, fish, chicken, and eggs, or beans, lentils, tofu, and grains, such as rice.  Drink plenty of water daily.  Decrease saturated fat in the diet, avoid lots of red meat, processed foods, sweets, fast foods, and fried foods.  Exercise:  Aerobic exercise helps maintain good heart health. At least 30-40 minutes of moderate-intensity exercise is recommended. For example, a brisk walk that increases your heart rate and breathing. This should be done on most days of the week.   Find a type of exercise or a variety of exercises that you enjoy so that it becomes a part of your daily life.  Examples are running, walking, swimming, water aerobics, and biking.  For motivation and support, explore group exercise such as aerobic class, spin class, Zumba, Yoga,or  martial arts, etc.    Set exercise goals for yourself, such as a certain weight goal, walk or run in a race such as a 5k walk/run.  Speak to your primary care provider about exercise goals.  Disease prevention:  If you smoke or chew tobacco, find out from your caregiver how to quit. It can literally save your life, no matter how long you have been a tobacco user. If you do not use tobacco, never begin.   Maintain a healthy diet and normal weight. Increased weight leads to problems with blood pressure and diabetes.   The Body Mass Index or BMI is a way of measuring how much of your body is fat. Having a BMI above 27 increases the risk of heart disease,   diabetes, hypertension, stroke and other problems related to obesity. Your caregiver can help determine your BMI and based on it develop an exercise and dietary program to help you achieve or maintain this important measurement at a healthful level.  High blood pressure causes heart and blood vessel problems.  Persistent high blood pressure should be treated with medicine if weight loss and exercise do not work.   Fat and cholesterol leaves  deposits in your arteries that can block them. This causes heart disease and vessel disease elsewhere in your body.  If your cholesterol is found to be high, or if you have heart disease or certain other medical conditions, then you may need to have your cholesterol monitored frequently and be treated with medication.   Ask if you should have a cardiac stress test if your history suggests this. A stress test is a test done on a treadmill that looks for heart disease. This test can find disease prior to there being a problem.  Menopause can be associated with physical symptoms and risks. Hormone replacement therapy is available to decrease these. You should talk to your caregiver about whether starting or continuing to take hormones is right for you.   Osteoporosis is a disease in which the bones lose minerals and strength as we age. This can result in serious bone fractures. Risk of osteoporosis can be identified using a bone density scan. Women ages 65 and over should discuss this with their caregivers, as should women after menopause who have other risk factors. Ask your caregiver whether you should be taking a calcium supplement and Vitamin D, to reduce the rate of osteoporosis.   Avoid drinking alcohol in excess (more than two drinks per day).  Avoid use of street drugs. Do not share needles with anyone. Ask for professional help if you need assistance or instructions on stopping the use of alcohol, cigarettes, and/or drugs.  Brush your teeth twice a day with fluoride toothpaste, and floss once a day. Good oral hygiene prevents tooth decay and gum disease. The problems can be painful, unattractive, and can cause other health problems. Visit your dentist for a routine oral and dental check up and preventive care every 6-12 months.   Look at your skin regularly.  Use a mirror to look at your back. Notify your caregivers of changes in moles, especially if there are changes in shapes, colors, a size  larger than a pencil eraser, an irregular border, or development of new moles.  Safety:  Use seatbelts 100% of the time, whether driving or as a passenger.  Use safety devices such as hearing protection if you work in environments with loud noise or significant background noise.  Use safety glasses when doing any work that could send debris in to the eyes.  Use a helmet if you ride a bike or motorcycle.  Use appropriate safety gear for contact sports.  Talk to your caregiver about gun safety.  Use sunscreen with a SPF (or skin protection factor) of 15 or greater.  Lighter skinned people are at a greater risk of skin cancer. Don't forget to also wear sunglasses in order to protect your eyes from too much damaging sunlight. Damaging sunlight can accelerate cataract formation.   Practice safe sex. Use condoms. Condoms are used for birth control and to help reduce the spread of sexually transmitted infections (or STIs).  Some of the STIs are gonorrhea (the clap), chlamydia, syphilis, trichomonas, herpes, HPV (human papilloma virus) and HIV (human immunodeficiency virus)   which causes AIDS. The herpes, HIV and HPV are viral illnesses that have no cure. These can result in disability, cancer and death.   Keep carbon monoxide and smoke detectors in your home functioning at all times. Change the batteries every 6 months or use a model that plugs into the wall.   Vaccinations:  Stay up to date with your tetanus shots and other required immunizations. You should have a booster for tetanus every 10 years. Be sure to get your flu shot every year, since 5%-20% of the [place] population comes down with the flu. The flu vaccine changes each year, so being vaccinated once is not enough. Get your shot in the fall, before the flu season peaks.   Other vaccines to consider:  Human Papilloma Virus or HPV causes cancer of the cervix, and other infections that can be transmitted from person to person. There is a vaccine for  HPV, and females should get immunized between the ages of 11 and 26. It requires a series of 3 shots.   Pneumococcal vaccine to protect against certain types of pneumonia.  This is normally recommended for adults age 65 or older.  However, adults younger than 78 years old with certain underlying conditions such as diabetes, heart or lung disease should also receive the vaccine.  Shingles vaccine to protect against Varicella Zoster if you are older than age 60, or younger than 78 years old with certain underlying illness.  Hepatitis A vaccine to protect against a form of infection of the liver by a virus acquired from food.  Hepatitis B vaccine to protect against a form of infection of the liver by a virus acquired from blood or body fluids, particularly if you work in health care.  If you plan to travel internationally, check with your local health department for specific vaccination recommendations.  Cancer Screening:  Breast cancer screening is essential to preventive care for women. All women age 20 and older should perform a breast self-exam every month. At age 40 and older, women should have their caregiver complete a breast exam each year. Women at ages 40 and older should have a mammogram (x-ray film) of the breasts. Your caregiver can discuss how often you need mammograms.    Cervical cancer screening includes taking a Pap smear (sample of cells examined under a microscope) from the cervix (end of the uterus). It also includes testing for HPV (Human Papilloma Virus, which can cause cervical cancer). Screening and a pelvic exam should begin at age 21, or 3 years after a woman becomes sexually active. Screening should occur every year, with a Pap smear but no HPV testing, up to age 30. After age 30, you should have a Pap smear every 3 years with HPV testing, if no HPV was found previously.   Most routine colon cancer screening begins at the age of 50. On a yearly basis, doctors may provide  special easy to use take-home tests to check for hidden blood in the stool. Sigmoidoscopy or colonoscopy can detect the earliest forms of colon cancer and is life saving. These tests use a small camera at the end of a tube to directly examine the colon. Speak to your caregiver about this at age 50, when routine screening begins (and is repeated every 5 years unless early forms of pre-cancerous polyps or small growths are found).    Advance Directive Advance directives are the legal documents that allow you to make choices about your health care and medical treatment if   you cannot speak for yourself. Advance directives are a way for you to communicate your wishes to family, friends, and health care providers. The specified people can then convey your decisions about end-of-life care to avoid confusion if you should become unable to communicate. Ideally, the process of discussing and writing advance directives should be discussed over time rather than making decisions all at once. Advance directives can be modified as your situation changes and you can change your mind at any time even after you have signed the advance directives. Each state has its own laws regarding advance directives. You may want to check with your health care provider, attorney, or state representative about the law in your state. Below are some examples of advance directives. LIVING WILL A living will is a set of instructions documenting your wishes about medical care when you cannot care for yourself. It is used if you become:  Terminally ill.  Incapacitated.  Unable to communicate.  Unable to make decisions. Items to consider in your living will include:  The use or non-use of life-sustaining equipment, such as dialysis machines and breathing machines (ventilators).  A "do not resuscitate" (DNR) order, which is the instruction not to use CPR if breathing or heartbeat stops.  Tube feeding.  Withholding of food and  fluids.  Comfort (palliative) care when the goal becomes comfort rather than a cure.  Organ and tissue donation. A living does not give instructions about distribution of your money and property if you should pass away. It is advisable to seek the expert advice of a lawyer in drawing up a will regarding your possessions. Decisions about taxes, beneficiaries, and asset distribution will be legally binding. This process can relieve your family and friends of any burdens surrounding disputes or questions that may come up about the allocation of your assets. DO NOT RESUSCITATE (DNR) A do not resuscitate (DNR) order is a request to not have cardiopulmonary resuscitation (CPR) in the event that your heart stops or you stop breathing. Unless given other instructions, a health care provider will try to help any patient whose heart has stopped or who has stopped breathing.  HEALTHCARE PROXY AND DURABLE POWER OF ATTORNEY FOR HEALTH CARE A health care proxy is a person (agent) appointed to make medical decisions for you if you cannot. Generally, people choose someone they know well and trust to represent their preferences when they can no longer do so. You should be sure to ask this person for agreement to act as your agent. An agent may have to exercise judgment in the event of a medical decision for which your wishes are not known. The durable power of attorney for health care is the legal document that names your health care proxy. Once written, it should be:  Signed.  Notarized.  Dated.  Copied.  Witnessed.  Incorporated into your medical record. You may also want to appoint someone to manage your financial affairs if you cannot. This is called a durable power of attorney for finances. It is a separate legal document from the durable power of attorney for health care. You may choose the same person or someone different from your health care proxy to act as your agent in financial matters. Document  Released: 10/08/2007 Document Revised: 03/03/2013 Document Reviewed: 11/18/2012 [employer] Patient Information 2014 [employer].  

## 2013-10-19 DIAGNOSIS — IMO0001 Reserved for inherently not codable concepts without codable children: Secondary | ICD-10-CM | POA: Diagnosis not present

## 2013-10-19 DIAGNOSIS — M9981 Other biomechanical lesions of cervical region: Secondary | ICD-10-CM | POA: Diagnosis not present

## 2013-10-19 DIAGNOSIS — M461 Sacroiliitis, not elsewhere classified: Secondary | ICD-10-CM | POA: Diagnosis not present

## 2013-10-19 DIAGNOSIS — IMO0002 Reserved for concepts with insufficient information to code with codable children: Secondary | ICD-10-CM | POA: Diagnosis not present

## 2013-10-19 DIAGNOSIS — M999 Biomechanical lesion, unspecified: Secondary | ICD-10-CM | POA: Diagnosis not present

## 2013-10-21 DIAGNOSIS — H4011X0 Primary open-angle glaucoma, stage unspecified: Secondary | ICD-10-CM | POA: Diagnosis not present

## 2013-10-26 DIAGNOSIS — M999 Biomechanical lesion, unspecified: Secondary | ICD-10-CM | POA: Diagnosis not present

## 2013-10-26 DIAGNOSIS — IMO0002 Reserved for concepts with insufficient information to code with codable children: Secondary | ICD-10-CM | POA: Diagnosis not present

## 2013-10-26 DIAGNOSIS — M9981 Other biomechanical lesions of cervical region: Secondary | ICD-10-CM | POA: Diagnosis not present

## 2013-10-26 DIAGNOSIS — M461 Sacroiliitis, not elsewhere classified: Secondary | ICD-10-CM | POA: Diagnosis not present

## 2013-10-26 DIAGNOSIS — IMO0001 Reserved for inherently not codable concepts without codable children: Secondary | ICD-10-CM | POA: Diagnosis not present

## 2013-11-09 DIAGNOSIS — IMO0001 Reserved for inherently not codable concepts without codable children: Secondary | ICD-10-CM | POA: Diagnosis not present

## 2013-11-09 DIAGNOSIS — M461 Sacroiliitis, not elsewhere classified: Secondary | ICD-10-CM | POA: Diagnosis not present

## 2013-11-09 DIAGNOSIS — M9981 Other biomechanical lesions of cervical region: Secondary | ICD-10-CM | POA: Diagnosis not present

## 2013-11-09 DIAGNOSIS — IMO0002 Reserved for concepts with insufficient information to code with codable children: Secondary | ICD-10-CM | POA: Diagnosis not present

## 2013-11-09 DIAGNOSIS — M999 Biomechanical lesion, unspecified: Secondary | ICD-10-CM | POA: Diagnosis not present

## 2013-11-30 DIAGNOSIS — M461 Sacroiliitis, not elsewhere classified: Secondary | ICD-10-CM | POA: Diagnosis not present

## 2013-11-30 DIAGNOSIS — IMO0002 Reserved for concepts with insufficient information to code with codable children: Secondary | ICD-10-CM | POA: Diagnosis not present

## 2013-11-30 DIAGNOSIS — IMO0001 Reserved for inherently not codable concepts without codable children: Secondary | ICD-10-CM | POA: Diagnosis not present

## 2013-11-30 DIAGNOSIS — M9981 Other biomechanical lesions of cervical region: Secondary | ICD-10-CM | POA: Diagnosis not present

## 2013-11-30 DIAGNOSIS — M999 Biomechanical lesion, unspecified: Secondary | ICD-10-CM | POA: Diagnosis not present

## 2013-12-16 ENCOUNTER — Ambulatory Visit: Payer: Self-pay | Admitting: Internal Medicine

## 2013-12-28 DIAGNOSIS — IMO0001 Reserved for inherently not codable concepts without codable children: Secondary | ICD-10-CM | POA: Diagnosis not present

## 2013-12-28 DIAGNOSIS — M999 Biomechanical lesion, unspecified: Secondary | ICD-10-CM | POA: Diagnosis not present

## 2013-12-28 DIAGNOSIS — IMO0002 Reserved for concepts with insufficient information to code with codable children: Secondary | ICD-10-CM | POA: Diagnosis not present

## 2013-12-28 DIAGNOSIS — M9981 Other biomechanical lesions of cervical region: Secondary | ICD-10-CM | POA: Diagnosis not present

## 2013-12-28 DIAGNOSIS — M461 Sacroiliitis, not elsewhere classified: Secondary | ICD-10-CM | POA: Diagnosis not present

## 2014-01-26 DIAGNOSIS — M9981 Other biomechanical lesions of cervical region: Secondary | ICD-10-CM | POA: Diagnosis not present

## 2014-01-26 DIAGNOSIS — IMO0001 Reserved for inherently not codable concepts without codable children: Secondary | ICD-10-CM | POA: Diagnosis not present

## 2014-01-26 DIAGNOSIS — M999 Biomechanical lesion, unspecified: Secondary | ICD-10-CM | POA: Diagnosis not present

## 2014-01-26 DIAGNOSIS — IMO0002 Reserved for concepts with insufficient information to code with codable children: Secondary | ICD-10-CM | POA: Diagnosis not present

## 2014-01-26 DIAGNOSIS — M461 Sacroiliitis, not elsewhere classified: Secondary | ICD-10-CM | POA: Diagnosis not present

## 2014-02-23 DIAGNOSIS — IMO0001 Reserved for inherently not codable concepts without codable children: Secondary | ICD-10-CM | POA: Diagnosis not present

## 2014-02-23 DIAGNOSIS — M999 Biomechanical lesion, unspecified: Secondary | ICD-10-CM | POA: Diagnosis not present

## 2014-02-23 DIAGNOSIS — M461 Sacroiliitis, not elsewhere classified: Secondary | ICD-10-CM | POA: Diagnosis not present

## 2014-02-23 DIAGNOSIS — IMO0002 Reserved for concepts with insufficient information to code with codable children: Secondary | ICD-10-CM | POA: Diagnosis not present

## 2014-02-23 DIAGNOSIS — M9981 Other biomechanical lesions of cervical region: Secondary | ICD-10-CM | POA: Diagnosis not present

## 2014-03-23 DIAGNOSIS — M999 Biomechanical lesion, unspecified: Secondary | ICD-10-CM | POA: Diagnosis not present

## 2014-03-23 DIAGNOSIS — IMO0001 Reserved for inherently not codable concepts without codable children: Secondary | ICD-10-CM | POA: Diagnosis not present

## 2014-03-23 DIAGNOSIS — IMO0002 Reserved for concepts with insufficient information to code with codable children: Secondary | ICD-10-CM | POA: Diagnosis not present

## 2014-03-23 DIAGNOSIS — M9981 Other biomechanical lesions of cervical region: Secondary | ICD-10-CM | POA: Diagnosis not present

## 2014-03-23 DIAGNOSIS — M461 Sacroiliitis, not elsewhere classified: Secondary | ICD-10-CM | POA: Diagnosis not present

## 2014-04-08 ENCOUNTER — Ambulatory Visit (INDEPENDENT_AMBULATORY_CARE_PROVIDER_SITE_OTHER): Payer: Medicare Other | Admitting: Internal Medicine

## 2014-04-08 ENCOUNTER — Encounter: Payer: Self-pay | Admitting: Internal Medicine

## 2014-04-08 VITALS — BP 142/84 | HR 72 | Temp 98.1°F | Resp 16 | Ht 62.25 in | Wt 134.2 lb

## 2014-04-08 DIAGNOSIS — Z1212 Encounter for screening for malignant neoplasm of rectum: Secondary | ICD-10-CM

## 2014-04-08 DIAGNOSIS — E782 Mixed hyperlipidemia: Secondary | ICD-10-CM

## 2014-04-08 DIAGNOSIS — I4949 Other premature depolarization: Secondary | ICD-10-CM

## 2014-04-08 DIAGNOSIS — E559 Vitamin D deficiency, unspecified: Secondary | ICD-10-CM

## 2014-04-08 DIAGNOSIS — R7309 Other abnormal glucose: Secondary | ICD-10-CM | POA: Diagnosis not present

## 2014-04-08 DIAGNOSIS — Z79899 Other long term (current) drug therapy: Secondary | ICD-10-CM | POA: Diagnosis not present

## 2014-04-08 DIAGNOSIS — Z1331 Encounter for screening for depression: Secondary | ICD-10-CM

## 2014-04-08 DIAGNOSIS — I10 Essential (primary) hypertension: Secondary | ICD-10-CM | POA: Diagnosis not present

## 2014-04-08 DIAGNOSIS — Z789 Other specified health status: Secondary | ICD-10-CM | POA: Diagnosis not present

## 2014-04-08 DIAGNOSIS — I447 Left bundle-branch block, unspecified: Secondary | ICD-10-CM

## 2014-04-08 LAB — CBC WITH DIFFERENTIAL/PLATELET
Basophils Absolute: 0.1 10*3/uL (ref 0.0–0.1)
Basophils Relative: 1 % (ref 0–1)
Eosinophils Absolute: 0.2 10*3/uL (ref 0.0–0.7)
Eosinophils Relative: 3 % (ref 0–5)
HEMATOCRIT: 41.5 % (ref 36.0–46.0)
HEMOGLOBIN: 14.5 g/dL (ref 12.0–15.0)
LYMPHS PCT: 19 % (ref 12–46)
Lymphs Abs: 1 10*3/uL (ref 0.7–4.0)
MCH: 30.1 pg (ref 26.0–34.0)
MCHC: 34.9 g/dL (ref 30.0–36.0)
MCV: 86.3 fL (ref 78.0–100.0)
MONO ABS: 0.6 10*3/uL (ref 0.1–1.0)
Monocytes Relative: 11 % (ref 3–12)
NEUTROS PCT: 66 % (ref 43–77)
Neutro Abs: 3.6 10*3/uL (ref 1.7–7.7)
Platelets: 326 10*3/uL (ref 150–400)
RBC: 4.81 MIL/uL (ref 3.87–5.11)
RDW: 14.5 % (ref 11.5–15.5)
WBC: 5.4 10*3/uL (ref 4.0–10.5)

## 2014-04-08 LAB — URINALYSIS, MICROSCOPIC ONLY
Bacteria, UA: NONE SEEN
Casts: NONE SEEN
Crystals: NONE SEEN
SQUAMOUS EPITHELIAL / LPF: NONE SEEN

## 2014-04-08 LAB — HEMOGLOBIN A1C
Hgb A1c MFr Bld: 5.8 % — ABNORMAL HIGH (ref <5.7)
Mean Plasma Glucose: 120 mg/dL — ABNORMAL HIGH (ref <117)

## 2014-04-08 NOTE — Progress Notes (Signed)
Patient ID: Leslie [name], female   DOB: 1927/07/05, 80 y.o.   MRN: [id number] Annual Screening Comprehensive Examination  This very nice 80 y.o.MWF has been followed in my practice for about 20+ years and presents for complete physical.  Patient has been followed for HTN, Prediabetes, Hyperlipidemia, and Vitamin D Deficiency.    HTN predates since 96. Patient's BP has been controlled at home and patient denies any cardiac symptoms as chest pain, palpitations, shortness of breath, dizziness or ankle swelling. Today's BP: 142/84 mmHg. Patient has tapered off meds due to low BP's over the last year and monitored BP's which have remained normal. Patient does have CKD2 felt due to hypertensive nephrosclerosiswith GFR 77 ml/min.   Patient's hyperlipidemia is fairly controlled with diet. Patient denies myalgias or other medication SE's. Last lipids were near goal with Total Chol 197; HDL  52; LDL  112*; Trig 164*on 07/01/2013.   Patient has prediabetes predating since Aug 2012 with A1c 5.7% and patient denies reactive hypoglycemic symptoms, visual blurring, diabetic polys, or paresthesias. Last A1c was  5.9% on 07/01/2013.   Finally, patient has history of Vitamin D Deficiency and last Vitamin D was  47 on 07/01/2013.  Medication Sig  . estradiol  1 MG tab Take 1 mg by mouth daily.  Marland Kitchen FLAXSEED OIL 1000 MG  Take 1,000 mg by mouth daily.  Marland Kitchen LORazepam  1 MG tablet 1/2 to 1  tab up to 3 x daily as needed for anxiety or sleep  . magnesium oxide  400 MG tab Take 400 mg by mouth daily.  . MULTIVITAMIN Take 1 tablet by mouth daily.  . traMADol  50 MG tablet Take 25 mg by mouth every 6 (six) hours as needed.   Allergies  Allergen Reactions  . Adhesive [Tape]   . Advil [Ibuprofen]   . Atenolol   . Titanium    Past Medical History  Diagnosis Date  . Chest pain   . Essential hypertension, benign   . Other left bundle branch block   . Other premature beats   . GERD (gastroesophageal reflux disease)    . Hyperlipidemia   . Pre-diabetes   . DDD (degenerative disc disease)   . OA (osteoarthritis)   . Glaucoma    Past Surgical History  Procedure Laterality Date  . Glaucoma surgery  2013  . Tonsillectomy     Family History  Problem Relation Age of Onset  . Heart attack Brother   . Heart disease Brother   . Heart attack Brother   . Heart disease Brother   . Hypertension Mother   . Stroke Mother   . Heart attack Father   . Diabetes Father    History  Substance Use Topics  . Smoking status: Former Smoker    Quit date: 07/15/1978  . Smokeless tobacco: Not on file  . Alcohol Use: No    ROS Constitutional: Denies fever, chills, weight loss/gain, headaches, insomnia, fatigue, night sweats, and change in appetite. Eyes: Denies redness, blurred vision, diplopia, discharge, itchy, watery eyes.  ENT: Denies discharge, congestion, post nasal drip, epistaxis, sore throat, earache, hearing loss, dental pain, Tinnitus, Vertigo, Sinus pain, snoring.  Cardio: Denies chest pain, palpitations, irregular heartbeat, syncope, dyspnea, diaphoresis, orthopnea, PND, claudication, edema Respiratory: denies cough, dyspnea, DOE, pleurisy, hoarseness, laryngitis, wheezing.  Gastrointestinal: Denies dysphagia, heartburn, reflux, water brash, pain, cramps, nausea, vomiting, bloating, diarrhea, constipation, hematemesis, melena, hematochezia, jaundice, hemorrhoids Genitourinary: Denies dysuria, frequency, urgency, nocturia, hesitancy, discharge, hematuria, flank pain Breast: Breast  lumps, nipple discharge, bleeding.  Musculoskeletal: Denies arthralgia, myalgia, stiffness, Jt. Swelling, pain, limp, and strain/sprain. Denies falls. Skin: Denies puritis, rash, hives, warts, acne, eczema, changing in skin lesion Neuro: No weakness, tremor, incoordination, spasms, paresthesia, pain Psychiatric: Denies confusion, memory loss, sensory loss. Denies Depression. Endocrine: Denies change in weight, skin, hair change,  nocturia, and paresthesia, diabetic polys, visual blurring, hyper / hypo glycemic episodes.  Heme/Lymph: No excessive bleeding, bruising, enlarged lymph nodes.  Physical Exam  BP 142/84  Pulse 72  Temp(Src) 98.1 F (36.7 C) (Temporal)  Resp 16  Ht 5' 2.25" (1.581 m)  Wt 134 lb 3.2 oz (60.873 kg)  BMI 24.35 kg/m2  General Appearance: Well nourished and in no apparent distress. Eyes: PERRLA, EOMs, conjunctiva no swelling or erythema, normal fundi and vessels. Sinuses: No frontal/maxillary tenderness ENT/Mouth: EACs patent / TMs  nl. Nares clear without erythema, swelling, mucoid exudates. Oral hygiene is good. No erythema, swelling, or exudate. Tongue normal, non-obstructing. Tonsils not swollen or erythematous. Hearing normal.  Neck: Supple, thyroid normal. No bruits, nodes or JVD. Respiratory: Respiratory effort normal.  BS equal and clear bilateral without rales, rhonci, wheezing or stridor. Cardio: Heart sounds are normal with regular rate and rhythm and no murmurs, rubs or gallops. Peripheral pulses are normal and equal bilaterally without edema. No aortic or femoral bruits. Chest: symmetric with normal excursions and percussion. Breasts: Symmetric, without lumps, nipple discharge, retractions, or fibrocystic changes.  Abdomen: Flat, soft, with bowl sounds. Nontender, no guarding, rebound, hernias, masses, or organomegaly.  Lymphatics: Non tender without lymphadenopathy.  Genitourinary:  Musculoskeletal: Full ROM all peripheral extremities, joint stability, 5/5 strength, and normal gait. Skin: Warm and dry without rashes, lesions, cyanosis, clubbing or  ecchymosis.  Neuro: Cranial nerves intact, reflexes equal bilaterally. Normal muscle tone, no cerebellar symptoms. Sensation intact.  Pysch: Awake and oriented X 3, normal affect, Insight and Judgment appropriate.   Assessment and Plan  1. Annual Screening Examination 2. Hypertension, monitoring 3. Hyperlipidemia, diet  4. Pre  Diabetes, monitoring 5. Vitamin D Deficiency, supplementing  Continue prudent diet as discussed, weight control, BP monitoring, regular exercise, and medications. Discussed med's effects and SE's. Screening labs and tests as requested with regular follow-up as recommended. Also, advised to taper her estradiol to 1/2 tab daily and in addition to begin taking bASA 81 mg daily

## 2014-04-08 NOTE — Patient Instructions (Signed)
Recommend the book "The END of DIETING" by Dr Baker Janus   and the book "The END of DIABETES " by Dr Excell Seltzer  At [email or web address] - get book & Audio CD's      Being diabetic has a  300% increased risk for heart attack, stroke, cancer, and alzheimer- type vascular dementia. It is very important that you work harder with diet by avoiding all foods that are white except chicken & fish. Avoid white rice (brown & wild rice is OK), white potatoes (sweetpotatoes in moderation is OK), White bread or wheat bread or anything made out of white flour like bagels, donuts, rolls, buns, biscuits, cakes, pastries, cookies, pizza crust, and pasta (made from white flour & egg whites) - vegetarian pasta or spinach or wheat pasta is OK. Multigrain breads like Arnold's or [employer], or multigrain sandwich thins or flatbreads.  Diet, exercise and weight loss can reverse and cure diabetes in the early stages.  Diet, exercise and weight loss is very important in the control and prevention of complications of diabetes which affects every system in your body, ie. Brain - dementia/stroke, eyes - glaucoma/blindness, heart - heart attack/heart failure, kidneys - dialysis, stomach - gastric paralysis, intestines - malabsorption, nerves - severe painful neuritis, circulation - gangrene & loss of a leg(s), and finally cancer and Alzheimers.    I recommend avoid fried & greasy foods,  sweets/candy, white rice (brown or wild rice or Quinoa is OK), white potatoes (sweet potatoes are OK) - anything made from white flour - bagels, doughnuts, rolls, buns, biscuits,white and wheat breads, pizza crust and traditional pasta made of white flour & egg white(vegetarian pasta or spinach or wheat pasta is OK).  Multi-grain bread is OK - like multi-grain flat bread or sandwich thins. Avoid alcohol in excess. Exercise is also important.    Eat all the vegetables you want - avoid meat, especially red meat and dairy - especially cheese.  Cheese  is the most concentrated form of trans-fats which is the worst thing to clog up our arteries. Veggie cheese is OK which can be found in the fresh produce section at [employer] or [employer] or [employer]  Preventive Care for Adults A healthy lifestyle and preventive care can promote health and wellness. Preventive health guidelines for women include the following key practices.  A routine yearly physical is a good way to check with your health care provider about your health and preventive screening. It is a chance to share any concerns and updates on your health and to receive a thorough exam.  Visit your dentist for a routine exam and preventive care every 6 months. Brush your teeth twice a day and floss once a day. Good oral hygiene prevents tooth decay and gum disease.  The frequency of eye exams is based on your age, health, family medical history, use of contact lenses, and other factors. Follow your health care provider's recommendations for frequency of eye exams.  Eat a healthy diet. Foods like vegetables, fruits, whole grains, low-fat dairy products, and lean protein foods contain the nutrients you need without too many calories. Decrease your intake of foods high in solid fats, added sugars, and salt. Eat the right amount of calories for you.Get information about a proper diet from your health care provider, if necessary.  Regular physical exercise is one of the most important things you can do for your health. Most adults should get at least 150 minutes of moderate-intensity exercise (any activity that increases  your heart rate and causes you to sweat) each week. In addition, most adults need muscle-strengthening exercises on 2 or more days a week.  Maintain a healthy weight. The body mass index (BMI) is a screening tool to identify possible weight problems. It provides an estimate of body fat based on height and weight. Your health care provider can find your BMI and can help you  achieve or maintain a healthy weight.For adults 20 years and older:  A BMI below 18.5 is considered underweight.  A BMI of 18.5 to 24.9 is normal.  A BMI of 25 to 29.9 is considered overweight.  A BMI of 30 and above is considered obese.  Maintain normal blood lipids and cholesterol levels by exercising and minimizing your intake of saturated fat. Eat a balanced diet with plenty of fruit and vegetables. Blood tests for lipids and cholesterol should begin at age 43 and be repeated every 5 years. If your lipid or cholesterol levels are high, you are over 50, or you are at high risk for heart disease, you may need your cholesterol levels checked more frequently.Ongoing high lipid and cholesterol levels should be treated with medicines if diet and exercise are not working.  If you smoke, find out from your health care provider how to quit. If you do not use tobacco, do not start.  Lung cancer screening is recommended for adults aged 40-80 years who are at high risk for developing lung cancer because of a history of smoking. A yearly low-dose CT scan of the lungs is recommended for people who have at least a 30-pack-year history of smoking and are a current smoker or have quit within the past 15 years. A pack year of smoking is smoking an average of 1 pack of cigarettes a day for 1 year (for example: 1 pack a day for 30 years or 2 packs a day for 15 years). Yearly screening should continue until the smoker has stopped smoking for at least 15 years. Yearly screening should be stopped for people who develop a health problem that would prevent them from having lung cancer treatment.  If you are pregnant, do not drink alcohol. If you are breastfeeding, be very cautious about drinking alcohol. If you are not pregnant and choose to drink alcohol, do not have more than 1 drink per day. One drink is considered to be 12 ounces (355 mL) of beer, 5 ounces (148 mL) of wine, or 1.5 ounces (44 mL) of liquor.  Avoid  use of street drugs. Do not share needles with anyone. Ask for help if you need support or instructions about stopping the use of drugs.  High blood pressure causes heart disease and increases the risk of stroke. Your blood pressure should be checked at least every 1 to 2 years. Ongoing high blood pressure should be treated with medicines if weight loss and exercise do not work.  If you are 74-43 years old, ask your health care provider if you should take aspirin to prevent strokes.  Diabetes screening involves taking a blood sample to check your fasting blood sugar level. This should be done once every 3 years, after age 105, if you are within normal weight and without risk factors for diabetes. Testing should be considered at a younger age or be carried out more frequently if you are overweight and have at least 1 risk factor for diabetes.  Breast cancer screening is essential preventive care for women. You should practice "breast self-awareness." This means understanding the  normal appearance and feel of your breasts and may include breast self-examination. Any changes detected, no matter how small, should be reported to a health care provider. Women in their 77s and 30s should have a clinical breast exam (CBE) by a health care provider as part of a regular health exam every 1 to 3 years. After age 52, women should have a CBE every year. Starting at age 59, women should consider having a mammogram (breast X-ray test) every year. Women who have a family history of breast cancer should talk to their health care provider about genetic screening. Women at a high risk of breast cancer should talk to their health care providers about having an MRI and a mammogram every year.  Breast cancer gene (BRCA)-related cancer risk assessment is recommended for women who have family members with BRCA-related cancers. BRCA-related cancers include breast, ovarian, tubal, and peritoneal cancers. Having family members with  these cancers may be associated with an increased risk for harmful changes (mutations) in the breast cancer genes BRCA1 and BRCA2. Results of the assessment will determine the need for genetic counseling and BRCA1 and BRCA2 testing.  Routine pelvic exams to screen for cancer are no longer recommended for nonpregnant women who are considered low risk for cancer of the pelvic organs (ovaries, uterus, and vagina) and who do not have symptoms. Ask your health care provider if a screening pelvic exam is right for you.  If you have had past treatment for cervical cancer or a condition that could lead to cancer, you need Pap tests and screening for cancer for at least 20 years after your treatment. If Pap tests have been discontinued, your risk factors (such as having a new sexual partner) need to be reassessed to determine if screening should be resumed. Some women have medical problems that increase the chance of getting cervical cancer. In these cases, your health care provider may recommend more frequent screening and Pap tests.  The HPV test is an additional test that may be used for cervical cancer screening. The HPV test looks for the virus that can cause the cell changes on the cervix. The cells collected during the Pap test can be tested for HPV. The HPV test could be used to screen women aged 43 years and older, and should be used in women of any age who have unclear Pap test results. After the age of 50, women should have HPV testing at the same frequency as a Pap test.  Colorectal cancer can be detected and often prevented. Most routine colorectal cancer screening begins at the age of 30 years and continues through age 66 years. However, your health care provider may recommend screening at an earlier age if you have risk factors for colon cancer. On a yearly basis, your health care provider may provide home test kits to check for hidden blood in the stool. Use of a small camera at the end of a tube, to  directly examine the colon (sigmoidoscopy or colonoscopy), can detect the earliest forms of colorectal cancer. Talk to your health care provider about this at age 43, when routine screening begins. Direct exam of the colon should be repeated every 5-10 years through age 56 years, unless early forms of pre-cancerous polyps or small growths are found.  People who are at an increased risk for hepatitis B should be screened for this virus. You are considered at high risk for hepatitis B if:  You were born in a country where hepatitis B occurs  often. Talk with your health care provider about which countries are considered high risk.  Your parents were born in a high-risk country and you have not received a shot to protect against hepatitis B (hepatitis B vaccine).  You have HIV or AIDS.  You use needles to inject street drugs.  You live with, or have sex with, someone who has hepatitis B.  You get hemodialysis treatment.  You take certain medicines for conditions like cancer, organ transplantation, and autoimmune conditions.  Hepatitis C blood testing is recommended for all people born from [birth year] through 1965 and any individual with known risks for hepatitis C.  Practice safe sex. Use condoms and avoid high-risk sexual practices to reduce the spread of sexually transmitted infections (STIs). STIs include gonorrhea, chlamydia, syphilis, trichomonas, herpes, HPV, and human immunodeficiency virus (HIV). Herpes, HIV, and HPV are viral illnesses that have no cure. They can result in disability, cancer, and death.  You should be screened for sexually transmitted illnesses (STIs) including gonorrhea and chlamydia if:  You are sexually active and are younger than 24 years.  You are older than 24 years and your health care provider tells you that you are at risk for this type of infection.  Your sexual activity has changed since you were last screened and you are at an increased risk for chlamydia or  gonorrhea. Ask your health care provider if you are at risk.  If you are at risk of being infected with HIV, it is recommended that you take a prescription medicine daily to prevent HIV infection. This is called preexposure prophylaxis (PrEP). You are considered at risk if:  You are a heterosexual woman, are sexually active, and are at increased risk for HIV infection.  You take drugs by injection.  You are sexually active with a partner who has HIV.  Talk with your health care provider about whether you are at high risk of being infected with HIV. If you choose to begin PrEP, you should first be tested for HIV. You should then be tested every 3 months for as long as you are taking PrEP.  Osteoporosis is a disease in which the bones lose minerals and strength with aging. This can result in serious bone fractures or breaks. The risk of osteoporosis can be identified using a bone density scan. Women ages 35 years and over and women at risk for fractures or osteoporosis should discuss screening with their health care providers. Ask your health care provider whether you should take a calcium supplement or vitamin D to reduce the rate of osteoporosis.  Menopause can be associated with physical symptoms and risks. Hormone replacement therapy is available to decrease symptoms and risks. You should talk to your health care provider about whether hormone replacement therapy is right for you.  Use sunscreen. Apply sunscreen liberally and repeatedly throughout the day. You should seek shade when your shadow is shorter than you. Protect yourself by wearing long sleeves, pants, a wide-brimmed hat, and sunglasses year round, whenever you are outdoors.  Once a month, do a whole body skin exam, using a mirror to look at the skin on your back. Tell your health care provider of new moles, moles that have irregular borders, moles that are larger than a pencil eraser, or moles that have changed in shape or  color.  Stay current with required vaccines (immunizations).  Influenza vaccine. All adults should be immunized every year.  Tetanus, diphtheria, and acellular pertussis (Td, Tdap) vaccine. Pregnant women should receive  1 dose of Tdap vaccine during each pregnancy. The dose should be obtained regardless of the length of time since the last dose. Immunization is preferred during the 27th-36th week of gestation. An adult who has not previously received Tdap or who does not know her vaccine status should receive 1 dose of Tdap. This initial dose should be followed by tetanus and diphtheria toxoids (Td) booster doses every 10 years. Adults with an unknown or incomplete history of completing a 3-dose immunization series with Td-containing vaccines should begin or complete a primary immunization series including a Tdap dose. Adults should receive a Td booster every 10 years.  Varicella vaccine. An adult without evidence of immunity to varicella should receive 2 doses or a second dose if she has previously received 1 dose. Pregnant females who do not have evidence of immunity should receive the first dose after pregnancy. This first dose should be obtained before leaving the health care facility. The second dose should be obtained 4-8 weeks after the first dose.  Human papillomavirus (HPV) vaccine. Females aged 13-26 years who have not received the vaccine previously should obtain the 3-dose series. The vaccine is not recommended for use in pregnant females. However, pregnancy testing is not needed before receiving a dose. If a female is found to be pregnant after receiving a dose, no treatment is needed. In that case, the remaining doses should be delayed until after the pregnancy. Immunization is recommended for any person with an immunocompromised condition through the age of 32 years if she did not get any or all doses earlier. During the 3-dose series, the second dose should be obtained 4-8 weeks after the  first dose. The third dose should be obtained 24 weeks after the first dose and 16 weeks after the second dose.  Zoster vaccine. One dose is recommended for adults aged 26 years or older unless certain conditions are present.  Measles, mumps, and rubella (MMR) vaccine. Adults born before [birth year] generally are considered immune to measles and mumps. Adults born in [birth year] or later should have 1 or more doses of MMR vaccine unless there is a contraindication to the vaccine or there is laboratory evidence of immunity to each of the three diseases. A routine second dose of MMR vaccine should be obtained at least 28 days after the first dose for students attending postsecondary schools, health care workers, or international travelers. People who received inactivated measles vaccine or an unknown type of measles vaccine during 1963-1967 should receive 2 doses of MMR vaccine. People who received inactivated mumps vaccine or an unknown type of mumps vaccine before 1979 and are at high risk for mumps infection should consider immunization with 2 doses of MMR vaccine. For females of childbearing age, rubella immunity should be determined. If there is no evidence of immunity, females who are not pregnant should be vaccinated. If there is no evidence of immunity, females who are pregnant should delay immunization until after pregnancy. Unvaccinated health care workers born before [birth year] who lack laboratory evidence of measles, mumps, or rubella immunity or laboratory confirmation of disease should consider measles and mumps immunization with 2 doses of MMR vaccine or rubella immunization with 1 dose of MMR vaccine.  Pneumococcal 13-valent conjugate (PCV13) vaccine. When indicated, a person who is uncertain of her immunization history and has no record of immunization should receive the PCV13 vaccine. An adult aged 47 years or older who has certain medical conditions and has not been previously immunized should receive 1 dose of  PCV13 vaccine. This PCV13 should be followed with a dose of pneumococcal polysaccharide (PPSV23) vaccine. The PPSV23 vaccine dose should be obtained at least 8 weeks after the dose of PCV13 vaccine. An adult aged 66 years or older who has certain medical conditions and previously received 1 or more doses of PPSV23 vaccine should receive 1 dose of PCV13. The PCV13 vaccine dose should be obtained 1 or more years after the last PPSV23 vaccine dose.  Pneumococcal polysaccharide (PPSV23) vaccine. When PCV13 is also indicated, PCV13 should be obtained first. All adults aged 41 years and older should be immunized. An adult younger than age 20 years who has certain medical conditions should be immunized. Any person who resides in a nursing home or long-term care facility should be immunized. An adult smoker should be immunized. People with an immunocompromised condition and certain other conditions should receive both PCV13 and PPSV23 vaccines. People with human immunodeficiency virus (HIV) infection should be immunized as soon as possible after diagnosis. Immunization during chemotherapy or radiation therapy should be avoided. Routine use of PPSV23 vaccine is not recommended for American Indians, [place] Natives, or people younger than 65 years unless there are medical conditions that require PPSV23 vaccine. When indicated, people who have unknown immunization and have no record of immunization should receive PPSV23 vaccine. One-time revaccination 5 years after the first dose of PPSV23 is recommended for people aged 19-64 years who have chronic kidney failure, nephrotic syndrome, asplenia, or immunocompromised conditions. People who received 1-2 doses of PPSV23 before age 33 years should receive another dose of PPSV23 vaccine at age 19 years or later if at least 5 years have passed since the previous dose. Doses of PPSV23 are not needed for people immunized with PPSV23 at or after age 15 years.  Meningococcal vaccine.  Adults with asplenia or persistent complement component deficiencies should receive 2 doses of quadrivalent meningococcal conjugate (MenACWY-D) vaccine. The doses should be obtained at least 2 months apart. Microbiologists working with certain meningococcal bacteria, [place] recruits, people at risk during an outbreak, and people who travel to or live in countries with a high rate of meningitis should be immunized. A first-year college student up through age 52 years who is living in a residence hall should receive a dose if she did not receive a dose on or after her 16th birthday. Adults who have certain high-risk conditions should receive one or more doses of vaccine.  Hepatitis A vaccine. Adults who wish to be protected from this disease, have certain high-risk conditions, work with hepatitis A-infected animals, work in hepatitis A research labs, or travel to or work in countries with a high rate of hepatitis A should be immunized. Adults who were previously unvaccinated and who anticipate close contact with an international adoptee during the first 60 days after arrival in the [place] from a country with a high rate of hepatitis A should be immunized.  Hepatitis B vaccine. Adults who wish to be protected from this disease, have certain high-risk conditions, may be exposed to blood or other infectious body fluids, are household contacts or sex partners of hepatitis B positive people, are clients or workers in certain care facilities, or travel to or work in countries with a high rate of hepatitis B should be immunized.  Haemophilus influenzae type b (Hib) vaccine. A previously unvaccinated person with asplenia or sickle cell disease or having a scheduled splenectomy should receive 1 dose of Hib vaccine. Regardless of previous immunization, a recipient of a hematopoietic stem  cell transplant should receive a 3-dose series 6-12 months after her successful transplant. Hib vaccine is not recommended for  adults with HIV infection. Preventive Services / Frequency  Ages 65 years and over  Blood pressure check.** / Every 1 to 2 years.  Lipid and cholesterol check.** / Every 5 years beginning at age 20 years.  Lung cancer screening. / Every year if you are aged 55-80 years and have a 30-pack-year history of smoking and currently smoke or have quit within the past 15 years. Yearly screening is stopped once you have quit smoking for at least 15 years or develop a health problem that would prevent you from having lung cancer treatment.  Clinical breast exam.** / Every year after age 40 years.  BRCA-related cancer risk assessment.** / For women who have family members with a BRCA-related cancer (breast, ovarian, tubal, or peritoneal cancers).  Mammogram.** / Every year beginning at age 40 years and continuing for as long as you are in good health. Consult with your health care provider.  Pap test.** / Every 3 years starting at age 30 years through age 65 or 70 years with 3 consecutive normal Pap tests. Testing can be stopped between 65 and 70 years with 3 consecutive normal Pap tests and no abnormal Pap or HPV tests in the past 10 years.  HPV screening.** / Every 3 years from ages 30 years through ages 65 or 70 years with a history of 3 consecutive normal Pap tests. Testing can be stopped between 65 and 70 years with 3 consecutive normal Pap tests and no abnormal Pap or HPV tests in the past 10 years.  Fecal occult blood test (FOBT) of stool. / Every year beginning at age 50 years and continuing until age 75 years. You may not need to do this test if you get a colonoscopy every 10 years.  Flexible sigmoidoscopy or colonoscopy.** / Every 5 years for a flexible sigmoidoscopy or every 10 years for a colonoscopy beginning at age 50 years and continuing until age 75 years.  Hepatitis C blood test.** / For all people born from 1945 through 1965 and any individual with known risks for hepatitis  C.  Osteoporosis screening.** / A one-time screening for women ages 65 years and over and women at risk for fractures or osteoporosis.  Skin self-exam. / Monthly.  Influenza vaccine. / Every year.  Tetanus, diphtheria, and acellular pertussis (Tdap/Td) vaccine.** / 1 dose of Td every 10 years.  Varicella vaccine.** / Consult your health care provider.  Zoster vaccine.** / 1 dose for adults aged 60 years or older.  Pneumococcal 13-valent conjugate (PCV13) vaccine.** / Consult your health care provider.  Pneumococcal polysaccharide (PPSV23) vaccine.** / 1 dose for all adults aged 65 years and older.  Meningococcal vaccine.** / Consult your health care provider.  Hepatitis A vaccine.** / Consult your health care provider.  Hepatitis B vaccine.** / Consult your health care provider.  Haemophilus influenzae type b (Hib) vaccine.** / Consult your health care provider.  

## 2014-04-09 LAB — BASIC METABOLIC PANEL WITH GFR
BUN: 13 mg/dL (ref 6–23)
CHLORIDE: 102 meq/L (ref 96–112)
CO2: 28 mEq/L (ref 19–32)
Calcium: 9.8 mg/dL (ref 8.4–10.5)
Creat: 0.74 mg/dL (ref 0.50–1.10)
GFR, EST AFRICAN AMERICAN: 85 mL/min
GFR, Est Non African American: 74 mL/min
Glucose, Bld: 105 mg/dL — ABNORMAL HIGH (ref 70–99)
POTASSIUM: 4.6 meq/L (ref 3.5–5.3)
Sodium: 138 mEq/L (ref 135–145)

## 2014-04-09 LAB — MICROALBUMIN / CREATININE URINE RATIO
CREATININE, URINE: 42.4 mg/dL
MICROALB/CREAT RATIO: 9.4 mg/g (ref 0.0–30.0)
Microalb, Ur: 0.4 mg/dL (ref <2.0)

## 2014-04-09 LAB — LIPID PANEL
Cholesterol: 180 mg/dL (ref 0–200)
HDL: 55 mg/dL (ref >39)
LDL Cholesterol: 94 mg/dL (ref 0–99)
TRIGLYCERIDES: 153 mg/dL — AB (ref <150)
Total CHOL/HDL Ratio: 3.3 Ratio
VLDL: 31 mg/dL (ref 0–40)

## 2014-04-09 LAB — HEPATIC FUNCTION PANEL
ALK PHOS: 40 U/L (ref 39–117)
ALT: 17 U/L (ref 0–35)
AST: 20 U/L (ref 0–37)
Albumin: 4.1 g/dL (ref 3.5–5.2)
Bilirubin, Direct: 0.1 mg/dL (ref 0.0–0.3)
Indirect Bilirubin: 0.4 mg/dL (ref 0.2–1.2)
Total Bilirubin: 0.5 mg/dL (ref 0.2–1.2)
Total Protein: 6.5 g/dL (ref 6.0–8.3)

## 2014-04-09 LAB — VITAMIN D 25 HYDROXY (VIT D DEFICIENCY, FRACTURES): Vit D, 25-Hydroxy: 46 ng/mL (ref 30–89)

## 2014-04-09 LAB — MAGNESIUM: Magnesium: 2.2 mg/dL (ref 1.5–2.5)

## 2014-04-09 LAB — TSH: TSH: 2.348 u[IU]/mL (ref 0.350–4.500)

## 2014-04-09 LAB — INSULIN, FASTING: INSULIN FASTING, SERUM: 31.6 u[IU]/mL — AB (ref 2.0–19.6)

## 2014-04-20 DIAGNOSIS — M9905 Segmental and somatic dysfunction of pelvic region: Secondary | ICD-10-CM | POA: Diagnosis not present

## 2014-04-20 DIAGNOSIS — M9903 Segmental and somatic dysfunction of lumbar region: Secondary | ICD-10-CM | POA: Diagnosis not present

## 2014-04-20 DIAGNOSIS — M461 Sacroiliitis, not elsewhere classified: Secondary | ICD-10-CM | POA: Diagnosis not present

## 2014-04-20 DIAGNOSIS — M792 Neuralgia and neuritis, unspecified: Secondary | ICD-10-CM | POA: Diagnosis not present

## 2014-04-20 DIAGNOSIS — M5416 Radiculopathy, lumbar region: Secondary | ICD-10-CM | POA: Diagnosis not present

## 2014-04-20 DIAGNOSIS — M9901 Segmental and somatic dysfunction of cervical region: Secondary | ICD-10-CM | POA: Diagnosis not present

## 2014-04-21 DIAGNOSIS — H401411 Capsular glaucoma with pseudoexfoliation of lens, right eye, mild stage: Secondary | ICD-10-CM | POA: Diagnosis not present

## 2014-05-09 ENCOUNTER — Other Ambulatory Visit: Payer: Self-pay | Admitting: Internal Medicine

## 2014-05-18 DIAGNOSIS — M9903 Segmental and somatic dysfunction of lumbar region: Secondary | ICD-10-CM | POA: Diagnosis not present

## 2014-05-18 DIAGNOSIS — M9901 Segmental and somatic dysfunction of cervical region: Secondary | ICD-10-CM | POA: Diagnosis not present

## 2014-05-18 DIAGNOSIS — M9905 Segmental and somatic dysfunction of pelvic region: Secondary | ICD-10-CM | POA: Diagnosis not present

## 2014-05-18 DIAGNOSIS — M792 Neuralgia and neuritis, unspecified: Secondary | ICD-10-CM | POA: Diagnosis not present

## 2014-05-18 DIAGNOSIS — M461 Sacroiliitis, not elsewhere classified: Secondary | ICD-10-CM | POA: Diagnosis not present

## 2014-05-18 DIAGNOSIS — M5416 Radiculopathy, lumbar region: Secondary | ICD-10-CM | POA: Diagnosis not present

## 2014-05-19 DIAGNOSIS — H401411 Capsular glaucoma with pseudoexfoliation of lens, right eye, mild stage: Secondary | ICD-10-CM | POA: Diagnosis not present

## 2014-06-15 DIAGNOSIS — M5416 Radiculopathy, lumbar region: Secondary | ICD-10-CM | POA: Diagnosis not present

## 2014-06-15 DIAGNOSIS — M792 Neuralgia and neuritis, unspecified: Secondary | ICD-10-CM | POA: Diagnosis not present

## 2014-06-15 DIAGNOSIS — M9903 Segmental and somatic dysfunction of lumbar region: Secondary | ICD-10-CM | POA: Diagnosis not present

## 2014-06-15 DIAGNOSIS — M9901 Segmental and somatic dysfunction of cervical region: Secondary | ICD-10-CM | POA: Diagnosis not present

## 2014-06-15 DIAGNOSIS — M9905 Segmental and somatic dysfunction of pelvic region: Secondary | ICD-10-CM | POA: Diagnosis not present

## 2014-06-15 DIAGNOSIS — M461 Sacroiliitis, not elsewhere classified: Secondary | ICD-10-CM | POA: Diagnosis not present

## 2014-07-13 DIAGNOSIS — M9905 Segmental and somatic dysfunction of pelvic region: Secondary | ICD-10-CM | POA: Diagnosis not present

## 2014-07-13 DIAGNOSIS — M9903 Segmental and somatic dysfunction of lumbar region: Secondary | ICD-10-CM | POA: Diagnosis not present

## 2014-07-13 DIAGNOSIS — M461 Sacroiliitis, not elsewhere classified: Secondary | ICD-10-CM | POA: Diagnosis not present

## 2014-07-13 DIAGNOSIS — M9901 Segmental and somatic dysfunction of cervical region: Secondary | ICD-10-CM | POA: Diagnosis not present

## 2014-07-13 DIAGNOSIS — M792 Neuralgia and neuritis, unspecified: Secondary | ICD-10-CM | POA: Diagnosis not present

## 2014-07-13 DIAGNOSIS — M5416 Radiculopathy, lumbar region: Secondary | ICD-10-CM | POA: Diagnosis not present

## 2014-08-01 ENCOUNTER — Ambulatory Visit: Payer: Self-pay | Admitting: Physician Assistant

## 2014-08-02 ENCOUNTER — Ambulatory Visit: Payer: Self-pay | Admitting: Physician Assistant

## 2014-09-14 DIAGNOSIS — H401411 Capsular glaucoma with pseudoexfoliation of lens, right eye, mild stage: Secondary | ICD-10-CM | POA: Diagnosis not present

## 2014-09-28 DIAGNOSIS — H401411 Capsular glaucoma with pseudoexfoliation of lens, right eye, mild stage: Secondary | ICD-10-CM | POA: Diagnosis not present

## 2014-11-08 ENCOUNTER — Encounter: Payer: Self-pay | Admitting: Internal Medicine

## 2014-11-08 ENCOUNTER — Ambulatory Visit (INDEPENDENT_AMBULATORY_CARE_PROVIDER_SITE_OTHER): Payer: Medicare Other | Admitting: Internal Medicine

## 2014-11-08 ENCOUNTER — Other Ambulatory Visit: Payer: Self-pay | Admitting: *Deleted

## 2014-11-08 VITALS — BP 166/86 | HR 84 | Temp 97.7°F | Resp 16 | Ht 62.25 in | Wt 130.2 lb

## 2014-11-08 DIAGNOSIS — R7303 Prediabetes: Secondary | ICD-10-CM

## 2014-11-08 DIAGNOSIS — R7309 Other abnormal glucose: Secondary | ICD-10-CM

## 2014-11-08 DIAGNOSIS — E782 Mixed hyperlipidemia: Secondary | ICD-10-CM

## 2014-11-08 DIAGNOSIS — I10 Essential (primary) hypertension: Secondary | ICD-10-CM

## 2014-11-08 DIAGNOSIS — Z79899 Other long term (current) drug therapy: Secondary | ICD-10-CM

## 2014-11-08 DIAGNOSIS — E559 Vitamin D deficiency, unspecified: Secondary | ICD-10-CM | POA: Diagnosis not present

## 2014-11-08 LAB — CBC WITH DIFFERENTIAL/PLATELET
Basophils Absolute: 0.1 10*3/uL (ref 0.0–0.1)
Basophils Relative: 1 % (ref 0–1)
Eosinophils Absolute: 0.1 10*3/uL (ref 0.0–0.7)
Eosinophils Relative: 1 % (ref 0–5)
HEMATOCRIT: 44.6 % (ref 36.0–46.0)
Hemoglobin: 15 g/dL (ref 12.0–15.0)
Lymphocytes Relative: 22 % (ref 12–46)
Lymphs Abs: 1.4 10*3/uL (ref 0.7–4.0)
MCH: 30.9 pg (ref 26.0–34.0)
MCHC: 33.6 g/dL (ref 30.0–36.0)
MCV: 92 fL (ref 78.0–100.0)
MPV: 9.5 fL (ref 8.6–12.4)
Monocytes Absolute: 0.6 10*3/uL (ref 0.1–1.0)
Monocytes Relative: 9 % (ref 3–12)
Neutro Abs: 4.3 10*3/uL (ref 1.7–7.7)
Neutrophils Relative %: 67 % (ref 43–77)
PLATELETS: 329 10*3/uL (ref 150–400)
RBC: 4.85 MIL/uL (ref 3.87–5.11)
RDW: 13.7 % (ref 11.5–15.5)
WBC: 6.4 10*3/uL (ref 4.0–10.5)

## 2014-11-08 LAB — MAGNESIUM: Magnesium: 2.3 mg/dL (ref 1.5–2.5)

## 2014-11-08 LAB — BASIC METABOLIC PANEL WITH GFR
BUN: 16 mg/dL (ref 6–23)
CO2: 26 mEq/L (ref 19–32)
CREATININE: 0.69 mg/dL (ref 0.50–1.10)
Calcium: 9.9 mg/dL (ref 8.4–10.5)
Chloride: 100 mEq/L (ref 96–112)
GFR, EST NON AFRICAN AMERICAN: 79 mL/min
GFR, Est African American: >89 mL/min
Glucose, Bld: 80 mg/dL (ref 70–99)
Potassium: 4.5 mEq/L (ref 3.5–5.3)
Sodium: 138 mEq/L (ref 135–145)

## 2014-11-08 LAB — HEPATIC FUNCTION PANEL
ALT: 12 U/L (ref 0–35)
AST: 17 U/L (ref 0–37)
Albumin: 4.1 g/dL (ref 3.5–5.2)
Alkaline Phosphatase: 38 U/L — ABNORMAL LOW (ref 39–117)
BILIRUBIN DIRECT: 0.1 mg/dL (ref 0.0–0.3)
Indirect Bilirubin: 0.5 mg/dL (ref 0.2–1.2)
TOTAL PROTEIN: 7.1 g/dL (ref 6.0–8.3)
Total Bilirubin: 0.6 mg/dL (ref 0.2–1.2)

## 2014-11-08 LAB — LIPID PANEL
CHOL/HDL RATIO: 2.8 ratio
CHOLESTEROL: 183 mg/dL (ref 0–200)
HDL: 65 mg/dL (ref >=46)
LDL CALC: 88 mg/dL (ref 0–99)
Triglycerides: 151 mg/dL — ABNORMAL HIGH (ref <150)
VLDL: 30 mg/dL (ref 0–40)

## 2014-11-08 LAB — HEMOGLOBIN A1C
Hgb A1c MFr Bld: 5.9 % — ABNORMAL HIGH (ref <5.7)
Mean Plasma Glucose: 123 mg/dL — ABNORMAL HIGH (ref <117)

## 2014-11-08 MED ORDER — ESTRADIOL 1 MG PO TABS: 1.0000 mg | ORAL_TABLET | Freq: Every day | ORAL | Status: DC

## 2014-11-08 NOTE — Patient Instructions (Signed)

## 2014-11-09 LAB — VITAMIN D 25 HYDROXY (VIT D DEFICIENCY, FRACTURES): Vit D, 25-Hydroxy: 28 ng/mL — ABNORMAL LOW (ref 30–100)

## 2014-11-09 LAB — TSH: TSH: 2.428 u[IU]/mL (ref 0.350–4.500)

## 2014-11-09 LAB — INSULIN, RANDOM: INSULIN: 12.9 u[IU]/mL (ref 2.0–19.6)

## 2014-11-09 NOTE — Progress Notes (Signed)
Patient ID: Leslie [name], female   DOB: [birth year], 81 y.o.   MRN: [id number]   This very nice 81 y.o. recently widowed (Jan) WF who presents for 3 month follow up with Hypertension, Hyperlipidemia, Pre-Diabetes and Vitamin D Deficiency.    Patient is treated for HTN & BP has been controlled at home. Today's BP: (!) 166/86 mmHg. She reports home BP's are always less than 140 systolic and are frequently sl elevated at Dr visits. Patient has had no complaints of any cardiac type chest pain, palpitations, dyspnea/orthopnea/PND, dizziness, claudication, or dependent edema.   Hyperlipidemia is controlled with diet & meds. Patient denies myalgias or other med SE's. Last Lipids wereTotal  Chol 183; HDL 65; LDL  88; Trig 151 on 11/08/2014.   Also, the patient has history of PreDiabetes and has had no symptoms of reactive hypoglycemia, diabetic polys, paresthesias or visual blurring.  Last A1c was  5.9% on 11/08/2014.   Further, the patient also has history of Vitamin D Deficiency and supplements vitamin D without any suspected side-effects. Last vitamin D was 46 on  04/08/2014.     Medication Sig  . FLAXSEED OIL 1000 MG CAPS Take 1,000 mg  daily.  Marland Kitchen LORazepam  1 MG tablet 1/2 to 1  tab up to 3 x daily as needed for anxiety or sleep  . magnesium oxide  400 MG tablet Take 400 mg  daily.  . MULTIVITAMIN Take 1 tablet  daily.  . traMADol 50 MG tablet Take 25 mg  every 6  hours as needed.  Marland Kitchen estradiol  1 MG tablet TAKE 1 TABLET DAILY   Allergies  Allergen Reactions  . Adhesive [Tape]   . Advil [Ibuprofen]   . Atenolol    PMHx:   Past Medical History  Diagnosis Date  . Chest pain   . Essential hypertension, benign   . Other left bundle branch block   . Other premature beats   . GERD (gastroesophageal reflux disease)   . Hyperlipidemia   . Pre-diabetes   . DDD (degenerative disc disease)   . OA (osteoarthritis)   . Glaucoma    Immunization History  Administered Date(s) Administered  . DTaP  05/06/2003  . Pneumococcal Polysaccharide-23 05/06/2003  . Zoster 07/16/2007   Past Surgical History  Procedure Laterality Date  . Glaucoma surgery  2013  . Tonsillectomy     FHx:    Reviewed / unchanged  SHx:    Reviewed / unchanged  Systems Review:  Constitutional: Denies fever, chills, wt changes, headaches, insomnia, fatigue, night sweats, change in appetite. Eyes: Denies redness, blurred vision, diplopia, discharge, itchy, watery eyes.  ENT: Denies discharge, congestion, post nasal drip, epistaxis, sore throat, earache, hearing loss, dental pain, tinnitus, vertigo, sinus pain, snoring.  CV: Denies chest pain, palpitations, irregular heartbeat, syncope, dyspnea, diaphoresis, orthopnea, PND, claudication or edema. Respiratory: denies cough, dyspnea, DOE, pleurisy, hoarseness, laryngitis, wheezing.  Gastrointestinal: Denies dysphagia, odynophagia, heartburn, reflux, water brash, abdominal pain or cramps, nausea, vomiting, bloating, diarrhea, constipation, hematemesis, melena, hematochezia  or hemorrhoids. Genitourinary: Denies dysuria, frequency, urgency, nocturia, hesitancy, discharge, hematuria or flank pain. Musculoskeletal: Denies arthralgias, myalgias, stiffness, jt. swelling, pain, limping or strain/sprain.  Skin: Denies pruritus, rash, hives, warts, acne, eczema or change in skin lesion(s). Neuro: No weakness, tremor, incoordination, spasms, paresthesia or pain. Psychiatric: Denies confusion, memory loss or sensory loss. Endo: Denies change in weight, skin or hair change.  Heme/Lymph: No excessive bleeding, bruising or enlarged lymph nodes.  Physical Exam  BP 166/86  Pulse 84  Temp 97.7 F   Resp 16  Ht 5' 2.25"   Wt 130 lb 3.2 oz     BMI 23.63  BP was rechecked at 150/86 Appears well nourished and in no distress. Eyes: PERRLA, EOMs, conjunctiva no swelling or erythema. Sinuses: No frontal/maxillary tenderness ENT/Mouth: EAC's clear, TM's nl w/o erythema, bulging.  Nares clear w/o erythema, swelling, exudates. Oropharynx clear without erythema or exudates. Oral hygiene is good. Tongue normal, non obstructing. Hearing intact.  Neck: Supple. Thyroid nl. Car 2+/2+ without bruits, nodes or JVD. Chest: Respirations nl with BS clear & equal w/o rales, rhonchi, wheezing or stridor.  Cor: Heart sounds normal w/ regular rate and rhythm without sig. murmurs, gallops, clicks, or rubs. Peripheral pulses normal and equal  without edema.  Abdomen: Soft & bowel sounds normal. Non-tender w/o guarding, rebound, hernias, masses, or organomegaly.  Lymphatics: Unremarkable.  Musculoskeletal: Full ROM all peripheral extremities, joint stability, 5/5 strength, and normal gait.  Skin: Warm, dry without exposed rashes, lesions or ecchymosis apparent.  Neuro: Cranial nerves intact, reflexes equal bilaterally. Sensory-motor testing grossly intact. Tendon reflexes grossly intact.  Pysch: Alert & oriented x 3.  Insight and judgement nl & appropriate. No ideations.  Assessment and Plan:  1. Essential hypertension  - TSH  2. Hyperlipidemia  - Lipid panel  3. Prediabetes  - Hemoglobin A1c - Insulin, random  4. Vitamin D deficiency  - Vit D  25 hydroxy   5. Medication management  - CBC with Differential/Platelet - BASIC METABOLIC PANEL WITH GFR - Hepatic function panel - Magnesium   Recommended regular exercise, BP monitoring, weight control, and discussed med and SE's. Recommended labs to assess and monitor clinical status. Further disposition pending results of labs. Over 30 minutes of exam, counseling, chart review was performed

## 2014-11-18 ENCOUNTER — Other Ambulatory Visit: Payer: Self-pay | Admitting: *Deleted

## 2014-11-18 MED ORDER — ESTRADIOL 1 MG PO TABS: 1.0000 mg | ORAL_TABLET | Freq: Every day | ORAL | Status: DC

## 2014-11-22 ENCOUNTER — Encounter: Payer: Self-pay | Admitting: Internal Medicine

## 2014-11-22 ENCOUNTER — Ambulatory Visit (INDEPENDENT_AMBULATORY_CARE_PROVIDER_SITE_OTHER): Payer: Medicare Other | Admitting: Internal Medicine

## 2014-11-22 VITALS — BP 166/84 | HR 84 | Temp 98.2°F | Resp 16 | Ht 62.25 in | Wt 130.0 lb

## 2014-11-22 DIAGNOSIS — R1030 Lower abdominal pain, unspecified: Secondary | ICD-10-CM

## 2014-11-22 DIAGNOSIS — IMO0002 Reserved for concepts with insufficient information to code with codable children: Secondary | ICD-10-CM

## 2014-11-22 DIAGNOSIS — K6289 Other specified diseases of anus and rectum: Secondary | ICD-10-CM

## 2014-11-22 DIAGNOSIS — R102 Pelvic and perineal pain: Secondary | ICD-10-CM

## 2014-11-22 DIAGNOSIS — N8110 Cystocele, unspecified: Secondary | ICD-10-CM

## 2014-11-22 MED ORDER — CIPROFLOXACIN HCL 500 MG PO TABS: 500.0000 mg | ORAL_TABLET | Freq: Two times a day (BID) | ORAL | Status: AC

## 2014-11-22 MED ORDER — LIDOCAINE-HYDROCORTISONE ACE 2.8-0.55 % RE GEL: RECTAL | Status: DC

## 2014-11-22 NOTE — Progress Notes (Signed)
Subjective:    Patient ID: Leslie [name], female    DOB: 01/14/1927, 81 y.o.   MRN: [id number]  Abdominal Pain Pertinent negatives include no constipation, diarrhea, fever, frequency, hematuria, nausea or vomiting.  Vaginal Pain Associated symptoms include abdominal pain. Pertinent negatives include no chills, constipation, diarrhea, fever, flank pain, frequency, hematuria, nausea, urgency or vomiting.   Patient presents to the office for evaluation of bilateral lower quadrant, rectal pain, tenesmus, and vaginal pain. Originally the patient thought that she had hemorrhoids, but now she reports that she has 1 BM and then she feels like throughout the next several hours she has to have 3 more episodes where she strains but gets little out.  She normally goes once a day.  She reports that her stool has not changed in consistantly.  She has more pain with straining than with the stool passing.  She reports that the first day she thought she saw some blood only on the tissue when she wiped.  She does have history of hemorrhoids and has never had to have them operated on.  She has been trying preparation H at home with no relief.  She has also used some suppositories with no relief.  She reports the abd pain preceded the rectal discomfort.  She reports that she has a constant aching in the lower quadrants which is relieved with tramadol.  She reports that she has had a C section, hysterectomy, and bladder tac.  She no longer has ovaries.  She does have a known cystocele.  She reports no history of colonoscopy.  She has never had a colonosopy.      Review of Systems  Constitutional: Negative for fever, chills and fatigue.  HENT: Negative for congestion, postnasal drip, rhinorrhea, sneezing, tinnitus and voice change.   Respiratory: Negative for chest tightness and shortness of breath.   Cardiovascular: Negative for chest pain, palpitations and leg swelling.  Gastrointestinal: Positive for abdominal pain  and blood in stool (1 time). Negative for nausea, vomiting, diarrhea, constipation and abdominal distention.  Genitourinary: Negative for urgency, frequency, hematuria, flank pain, vaginal pain and dyspareunia.       Objective:   Physical Exam  Constitutional: She is oriented to person, place, and time. She appears well-developed and well-nourished. No distress.  HENT:  Head: Normocephalic and atraumatic.  Mouth/Throat: No oropharyngeal exudate.  Eyes: Conjunctivae and EOM are normal. Pupils are equal, round, and reactive to light. No scleral icterus.  Neck: Normal range of motion. Neck supple. No JVD present. No thyromegaly present.  Cardiovascular: Normal rate, regular rhythm, normal heart sounds and intact distal pulses.  Exam reveals no gallop and no friction rub.   No murmur heard. Pulmonary/Chest: Effort normal and breath sounds normal. No respiratory distress. She has no wheezes. She has no rales. She exhibits no tenderness.  Abdominal: Soft. Bowel sounds are normal. She exhibits no distension and no mass. There is no tenderness. There is no rebound and no guarding.  Genitourinary: Uterus normal. Rectal exam shows external hemorrhoid. Rectal exam shows no internal hemorrhoid, no fissure, no mass, no tenderness and anal tone normal. Guaiac negative stool.  Cystocele present on vaginal exam.  No palpable enterocele or rectocele with coughing  Musculoskeletal: Normal range of motion.  Lymphadenopathy:    She has no cervical adenopathy.  Neurological: She is alert and oriented to person, place, and time.  Skin: Skin is warm and dry. She is not diaphoretic.  Psychiatric: She has a normal mood and affect.  Her behavior is normal. Judgment and thought content normal.  Nursing note and vitals reviewed.         Assessment & Plan:    1. Rectal pain -external hemorrhoids which are no thrombosed and skin tags.  No visible fissure or palpable internal hemorroids -increased fiber diet -  Lidocaine-Hydrocortisone Ace 2.8-0.55 % GEL; Use thin coat of gel as needed for rectal discomfort up to 3 times daily  Dispense: 100 g; Refill: 0  2. Vaginal pain -high risk for rectocele or enterocele given hysterectomy and recent straining - Ambulatory referral to Gynecology  3. Cystocele   4. Suprapubic pain, unspecified laterality -possible diverticulitis vs. UTI -treat with cipro - ciprofloxacin (CIPRO) 500 MG tablet; Take 1 tablet (500 mg total) by mouth 2 (two) times daily.  Dispense: 14 tablet; Refill: 0

## 2014-11-22 NOTE — Patient Instructions (Signed)
Hydrocortisone; Lidocaine rectal cream or gel What is this medicine? HYDROCORTISONE; LIDOCAINE (hye droe KOR ti sone; LYE doe kane) is a corticosteroid combined with an anesthetic pain reliever. It is used to decrease swelling, itching, and pain that is caused by minor rectal irritation or hemorrhoids. This medicine may be used for other purposes; ask your health care provider or pharmacist if you have questions. COMMON BRAND NAME(S): Ana-Lex HC, AnaMantle HC, AnaMantle-HC Forte, LidaMantle HC, LidaZone HC, LidoCort, Peranex HC, RectaGel HC What should I tell my health care provider before I take this medicine? They need to know if you have any of these conditions: -diabetes -large areas of burned or damaged skin -liver disease -skin infection (especially a virus infection such as chickenpox, cold sores, or herpes -skin wasting or thinning -tuberculosis -an unusual or allergic reaction to hydrocortisone, lidocaine, other medicines, foods, dyes, or preservatives -pregnant or trying to get pregnant -breast-feeding How should I use this medicine? This medicine is for rectal use only. Some products are applied to the skin surrounding the rectal area. Other products are gently inserted into the rectum. Follow the instructions that come in the package. Wash your hands before and after use. Use your medicine at regular intervals. Do not use it more often than directed. Talk to your pediatrician regarding the use of this medicine in children. Special care may be needed. Overdosage: If you think you've taken too much of this medicine contact a poison control center or emergency room at once. Overdosage: If you think you have taken too much of this medicine contact a poison control center or emergency room at once. NOTE: This medicine is only for you. Do not share this medicine with others. What if I miss a dose? If you miss a dose, use it as soon as you can. If it is almost time for your next dose, use  only that dose. Do not use double or extra doses. What may interact with this medicine? -certain medicines for irregular heart beat Do not use any other skin products on the affected area without asking your doctor or health care professional. This list may not describe all possible interactions. Give your health care provider a list of all the medicines, herbs, non-prescription drugs, or dietary supplements you use. Also tell them if you smoke, drink alcohol, or use illegal drugs. Some items may interact with your medicine. What should I watch for while using this medicine? Tell your doctor or health care professional if your symptoms do not start to get better or if they get worse. Do not get this medicine in your eyes. If you do, rinse out with plenty of cool tap water. This medicine can make certain skin conditions worse. Only use it for conditions for which your doctor or health care professional has prescribed. Be careful to avoid injury to the treated area while the area is numb and you are not aware of pain. What side effects may I notice from receiving this medicine? Side effects that you should report to your doctor or health care professional as soon as possible: -allergic reactions like skin rash, itching or hives, swelling of the face, lips, or tongue -burning feeling on the skin -confusion -dizziness -infection -lack of healing of skin conditions -palpitations -tremor Side effects that usually do not require medical attention (Report these to your doctor or health care professional if they continue or are bothersome.): -dry skin, irritation This list may not describe all possible side effects. Call your doctor for medical  advice about side effects. You may report side effects to FDA at 1-800-FDA-1088. Where should I keep my medicine? Keep out of the reach of children. Store at room temperature between 15 and 30 degrees C (59 and 86 degrees F). Throw away any unused medicine  after the expiration date. NOTE: This sheet is a summary. It may not cover all possible information. If you have questions about this medicine, talk to your doctor, pharmacist, or health care provider.  2015, [employer] Standard. (2010-02-19 15:04:22)

## 2014-11-24 DIAGNOSIS — N8189 Other female genital prolapse: Secondary | ICD-10-CM | POA: Diagnosis not present

## 2014-11-24 DIAGNOSIS — K649 Unspecified hemorrhoids: Secondary | ICD-10-CM | POA: Diagnosis not present

## 2014-11-24 DIAGNOSIS — R109 Unspecified abdominal pain: Secondary | ICD-10-CM | POA: Diagnosis not present

## 2014-11-28 DIAGNOSIS — N8189 Other female genital prolapse: Secondary | ICD-10-CM | POA: Diagnosis not present

## 2014-11-29 DIAGNOSIS — N8189 Other female genital prolapse: Secondary | ICD-10-CM | POA: Diagnosis not present

## 2014-11-30 DIAGNOSIS — K6289 Other specified diseases of anus and rectum: Secondary | ICD-10-CM | POA: Diagnosis not present

## 2014-11-30 DIAGNOSIS — K5900 Constipation, unspecified: Secondary | ICD-10-CM | POA: Diagnosis not present

## 2014-11-30 DIAGNOSIS — N819 Female genital prolapse, unspecified: Secondary | ICD-10-CM | POA: Diagnosis not present

## 2014-11-30 DIAGNOSIS — K625 Hemorrhage of anus and rectum: Secondary | ICD-10-CM | POA: Diagnosis not present

## 2014-12-01 DIAGNOSIS — N8189 Other female genital prolapse: Secondary | ICD-10-CM | POA: Diagnosis not present

## 2014-12-02 DIAGNOSIS — N8189 Other female genital prolapse: Secondary | ICD-10-CM | POA: Diagnosis not present

## 2014-12-06 ENCOUNTER — Ambulatory Visit: Payer: Self-pay | Admitting: Internal Medicine

## 2014-12-06 DIAGNOSIS — N819 Female genital prolapse, unspecified: Secondary | ICD-10-CM | POA: Diagnosis not present

## 2014-12-07 DIAGNOSIS — K641 Second degree hemorrhoids: Secondary | ICD-10-CM | POA: Diagnosis not present

## 2014-12-07 DIAGNOSIS — K5900 Constipation, unspecified: Secondary | ICD-10-CM | POA: Diagnosis not present

## 2014-12-20 ENCOUNTER — Other Ambulatory Visit: Payer: Self-pay | Admitting: *Deleted

## 2014-12-20 DIAGNOSIS — F411 Generalized anxiety disorder: Secondary | ICD-10-CM

## 2014-12-20 MED ORDER — LORAZEPAM 1 MG PO TABS: ORAL_TABLET | ORAL | Status: DC

## 2014-12-21 DIAGNOSIS — K641 Second degree hemorrhoids: Secondary | ICD-10-CM | POA: Diagnosis not present

## 2014-12-23 DIAGNOSIS — M25551 Pain in right hip: Secondary | ICD-10-CM | POA: Diagnosis not present

## 2014-12-23 DIAGNOSIS — M5137 Other intervertebral disc degeneration, lumbosacral region: Secondary | ICD-10-CM | POA: Diagnosis not present

## 2014-12-23 DIAGNOSIS — R201 Hypoesthesia of skin: Secondary | ICD-10-CM | POA: Diagnosis not present

## 2014-12-23 DIAGNOSIS — M791 Myalgia: Secondary | ICD-10-CM | POA: Diagnosis not present

## 2014-12-23 DIAGNOSIS — M545 Low back pain: Secondary | ICD-10-CM | POA: Diagnosis not present

## 2014-12-23 DIAGNOSIS — M5417 Radiculopathy, lumbosacral region: Secondary | ICD-10-CM | POA: Diagnosis not present

## 2014-12-23 DIAGNOSIS — M4726 Other spondylosis with radiculopathy, lumbar region: Secondary | ICD-10-CM | POA: Diagnosis not present

## 2014-12-26 DIAGNOSIS — R201 Hypoesthesia of skin: Secondary | ICD-10-CM | POA: Diagnosis not present

## 2014-12-26 DIAGNOSIS — M5137 Other intervertebral disc degeneration, lumbosacral region: Secondary | ICD-10-CM | POA: Diagnosis not present

## 2014-12-26 DIAGNOSIS — M5417 Radiculopathy, lumbosacral region: Secondary | ICD-10-CM | POA: Diagnosis not present

## 2014-12-26 DIAGNOSIS — R293 Abnormal posture: Secondary | ICD-10-CM | POA: Diagnosis not present

## 2014-12-26 DIAGNOSIS — M6240 Contracture of muscle, unspecified site: Secondary | ICD-10-CM | POA: Diagnosis not present

## 2014-12-26 DIAGNOSIS — M25551 Pain in right hip: Secondary | ICD-10-CM | POA: Diagnosis not present

## 2014-12-26 DIAGNOSIS — M9903 Segmental and somatic dysfunction of lumbar region: Secondary | ICD-10-CM | POA: Diagnosis not present

## 2014-12-26 DIAGNOSIS — M545 Low back pain: Secondary | ICD-10-CM | POA: Diagnosis not present

## 2014-12-26 DIAGNOSIS — M4726 Other spondylosis with radiculopathy, lumbar region: Secondary | ICD-10-CM | POA: Diagnosis not present

## 2014-12-26 DIAGNOSIS — M791 Myalgia: Secondary | ICD-10-CM | POA: Diagnosis not present

## 2014-12-28 DIAGNOSIS — R201 Hypoesthesia of skin: Secondary | ICD-10-CM | POA: Diagnosis not present

## 2014-12-28 DIAGNOSIS — M9903 Segmental and somatic dysfunction of lumbar region: Secondary | ICD-10-CM | POA: Diagnosis not present

## 2014-12-28 DIAGNOSIS — M791 Myalgia: Secondary | ICD-10-CM | POA: Diagnosis not present

## 2014-12-28 DIAGNOSIS — M5417 Radiculopathy, lumbosacral region: Secondary | ICD-10-CM | POA: Diagnosis not present

## 2014-12-28 DIAGNOSIS — M4726 Other spondylosis with radiculopathy, lumbar region: Secondary | ICD-10-CM | POA: Diagnosis not present

## 2014-12-28 DIAGNOSIS — M5137 Other intervertebral disc degeneration, lumbosacral region: Secondary | ICD-10-CM | POA: Diagnosis not present

## 2014-12-28 DIAGNOSIS — M545 Low back pain: Secondary | ICD-10-CM | POA: Diagnosis not present

## 2014-12-28 DIAGNOSIS — M25551 Pain in right hip: Secondary | ICD-10-CM | POA: Diagnosis not present

## 2014-12-28 DIAGNOSIS — M6240 Contracture of muscle, unspecified site: Secondary | ICD-10-CM | POA: Diagnosis not present

## 2014-12-28 DIAGNOSIS — R293 Abnormal posture: Secondary | ICD-10-CM | POA: Diagnosis not present

## 2014-12-30 DIAGNOSIS — M4726 Other spondylosis with radiculopathy, lumbar region: Secondary | ICD-10-CM | POA: Diagnosis not present

## 2014-12-30 DIAGNOSIS — M25551 Pain in right hip: Secondary | ICD-10-CM | POA: Diagnosis not present

## 2014-12-30 DIAGNOSIS — M791 Myalgia: Secondary | ICD-10-CM | POA: Diagnosis not present

## 2014-12-30 DIAGNOSIS — R293 Abnormal posture: Secondary | ICD-10-CM | POA: Diagnosis not present

## 2014-12-30 DIAGNOSIS — N819 Female genital prolapse, unspecified: Secondary | ICD-10-CM | POA: Diagnosis not present

## 2014-12-30 DIAGNOSIS — M6240 Contracture of muscle, unspecified site: Secondary | ICD-10-CM | POA: Diagnosis not present

## 2014-12-30 DIAGNOSIS — M5417 Radiculopathy, lumbosacral region: Secondary | ICD-10-CM | POA: Diagnosis not present

## 2014-12-30 DIAGNOSIS — M5137 Other intervertebral disc degeneration, lumbosacral region: Secondary | ICD-10-CM | POA: Diagnosis not present

## 2014-12-30 DIAGNOSIS — M545 Low back pain: Secondary | ICD-10-CM | POA: Diagnosis not present

## 2014-12-30 DIAGNOSIS — R201 Hypoesthesia of skin: Secondary | ICD-10-CM | POA: Diagnosis not present

## 2014-12-30 DIAGNOSIS — M9903 Segmental and somatic dysfunction of lumbar region: Secondary | ICD-10-CM | POA: Diagnosis not present

## 2015-01-02 DIAGNOSIS — R201 Hypoesthesia of skin: Secondary | ICD-10-CM | POA: Diagnosis not present

## 2015-01-02 DIAGNOSIS — M6240 Contracture of muscle, unspecified site: Secondary | ICD-10-CM | POA: Diagnosis not present

## 2015-01-02 DIAGNOSIS — M4726 Other spondylosis with radiculopathy, lumbar region: Secondary | ICD-10-CM | POA: Diagnosis not present

## 2015-01-02 DIAGNOSIS — M9903 Segmental and somatic dysfunction of lumbar region: Secondary | ICD-10-CM | POA: Diagnosis not present

## 2015-01-02 DIAGNOSIS — M791 Myalgia: Secondary | ICD-10-CM | POA: Diagnosis not present

## 2015-01-02 DIAGNOSIS — M545 Low back pain: Secondary | ICD-10-CM | POA: Diagnosis not present

## 2015-01-02 DIAGNOSIS — M25551 Pain in right hip: Secondary | ICD-10-CM | POA: Diagnosis not present

## 2015-01-02 DIAGNOSIS — M5417 Radiculopathy, lumbosacral region: Secondary | ICD-10-CM | POA: Diagnosis not present

## 2015-01-02 DIAGNOSIS — M5137 Other intervertebral disc degeneration, lumbosacral region: Secondary | ICD-10-CM | POA: Diagnosis not present

## 2015-01-02 DIAGNOSIS — R293 Abnormal posture: Secondary | ICD-10-CM | POA: Diagnosis not present

## 2015-01-04 DIAGNOSIS — R201 Hypoesthesia of skin: Secondary | ICD-10-CM | POA: Diagnosis not present

## 2015-01-04 DIAGNOSIS — M25551 Pain in right hip: Secondary | ICD-10-CM | POA: Diagnosis not present

## 2015-01-04 DIAGNOSIS — M9903 Segmental and somatic dysfunction of lumbar region: Secondary | ICD-10-CM | POA: Diagnosis not present

## 2015-01-04 DIAGNOSIS — M791 Myalgia: Secondary | ICD-10-CM | POA: Diagnosis not present

## 2015-01-04 DIAGNOSIS — M5137 Other intervertebral disc degeneration, lumbosacral region: Secondary | ICD-10-CM | POA: Diagnosis not present

## 2015-01-04 DIAGNOSIS — M6240 Contracture of muscle, unspecified site: Secondary | ICD-10-CM | POA: Diagnosis not present

## 2015-01-04 DIAGNOSIS — M4726 Other spondylosis with radiculopathy, lumbar region: Secondary | ICD-10-CM | POA: Diagnosis not present

## 2015-01-04 DIAGNOSIS — M545 Low back pain: Secondary | ICD-10-CM | POA: Diagnosis not present

## 2015-01-04 DIAGNOSIS — R293 Abnormal posture: Secondary | ICD-10-CM | POA: Diagnosis not present

## 2015-01-04 DIAGNOSIS — M5417 Radiculopathy, lumbosacral region: Secondary | ICD-10-CM | POA: Diagnosis not present

## 2015-01-06 DIAGNOSIS — M25551 Pain in right hip: Secondary | ICD-10-CM | POA: Diagnosis not present

## 2015-01-06 DIAGNOSIS — M791 Myalgia: Secondary | ICD-10-CM | POA: Diagnosis not present

## 2015-01-06 DIAGNOSIS — M9903 Segmental and somatic dysfunction of lumbar region: Secondary | ICD-10-CM | POA: Diagnosis not present

## 2015-01-06 DIAGNOSIS — R293 Abnormal posture: Secondary | ICD-10-CM | POA: Diagnosis not present

## 2015-01-06 DIAGNOSIS — M6240 Contracture of muscle, unspecified site: Secondary | ICD-10-CM | POA: Diagnosis not present

## 2015-01-06 DIAGNOSIS — M5137 Other intervertebral disc degeneration, lumbosacral region: Secondary | ICD-10-CM | POA: Diagnosis not present

## 2015-01-06 DIAGNOSIS — M5417 Radiculopathy, lumbosacral region: Secondary | ICD-10-CM | POA: Diagnosis not present

## 2015-01-06 DIAGNOSIS — M4726 Other spondylosis with radiculopathy, lumbar region: Secondary | ICD-10-CM | POA: Diagnosis not present

## 2015-01-06 DIAGNOSIS — M545 Low back pain: Secondary | ICD-10-CM | POA: Diagnosis not present

## 2015-01-06 DIAGNOSIS — R201 Hypoesthesia of skin: Secondary | ICD-10-CM | POA: Diagnosis not present

## 2015-01-09 DIAGNOSIS — M791 Myalgia: Secondary | ICD-10-CM | POA: Diagnosis not present

## 2015-01-09 DIAGNOSIS — R293 Abnormal posture: Secondary | ICD-10-CM | POA: Diagnosis not present

## 2015-01-09 DIAGNOSIS — M9903 Segmental and somatic dysfunction of lumbar region: Secondary | ICD-10-CM | POA: Diagnosis not present

## 2015-01-09 DIAGNOSIS — M5417 Radiculopathy, lumbosacral region: Secondary | ICD-10-CM | POA: Diagnosis not present

## 2015-01-09 DIAGNOSIS — M6240 Contracture of muscle, unspecified site: Secondary | ICD-10-CM | POA: Diagnosis not present

## 2015-01-09 DIAGNOSIS — R201 Hypoesthesia of skin: Secondary | ICD-10-CM | POA: Diagnosis not present

## 2015-01-09 DIAGNOSIS — M5137 Other intervertebral disc degeneration, lumbosacral region: Secondary | ICD-10-CM | POA: Diagnosis not present

## 2015-01-09 DIAGNOSIS — M4726 Other spondylosis with radiculopathy, lumbar region: Secondary | ICD-10-CM | POA: Diagnosis not present

## 2015-01-09 DIAGNOSIS — M25551 Pain in right hip: Secondary | ICD-10-CM | POA: Diagnosis not present

## 2015-01-09 DIAGNOSIS — M545 Low back pain: Secondary | ICD-10-CM | POA: Diagnosis not present

## 2015-01-11 DIAGNOSIS — M791 Myalgia: Secondary | ICD-10-CM | POA: Diagnosis not present

## 2015-01-11 DIAGNOSIS — K5900 Constipation, unspecified: Secondary | ICD-10-CM | POA: Diagnosis not present

## 2015-01-11 DIAGNOSIS — M25551 Pain in right hip: Secondary | ICD-10-CM | POA: Diagnosis not present

## 2015-01-11 DIAGNOSIS — M9903 Segmental and somatic dysfunction of lumbar region: Secondary | ICD-10-CM | POA: Diagnosis not present

## 2015-01-11 DIAGNOSIS — R293 Abnormal posture: Secondary | ICD-10-CM | POA: Diagnosis not present

## 2015-01-11 DIAGNOSIS — K641 Second degree hemorrhoids: Secondary | ICD-10-CM | POA: Diagnosis not present

## 2015-01-11 DIAGNOSIS — M5417 Radiculopathy, lumbosacral region: Secondary | ICD-10-CM | POA: Diagnosis not present

## 2015-01-11 DIAGNOSIS — M545 Low back pain: Secondary | ICD-10-CM | POA: Diagnosis not present

## 2015-01-11 DIAGNOSIS — M6240 Contracture of muscle, unspecified site: Secondary | ICD-10-CM | POA: Diagnosis not present

## 2015-01-11 DIAGNOSIS — M4726 Other spondylosis with radiculopathy, lumbar region: Secondary | ICD-10-CM | POA: Diagnosis not present

## 2015-01-11 DIAGNOSIS — R201 Hypoesthesia of skin: Secondary | ICD-10-CM | POA: Diagnosis not present

## 2015-01-11 DIAGNOSIS — M5137 Other intervertebral disc degeneration, lumbosacral region: Secondary | ICD-10-CM | POA: Diagnosis not present

## 2015-01-13 DIAGNOSIS — M9903 Segmental and somatic dysfunction of lumbar region: Secondary | ICD-10-CM | POA: Diagnosis not present

## 2015-01-13 DIAGNOSIS — M791 Myalgia: Secondary | ICD-10-CM | POA: Diagnosis not present

## 2015-01-13 DIAGNOSIS — R293 Abnormal posture: Secondary | ICD-10-CM | POA: Diagnosis not present

## 2015-01-13 DIAGNOSIS — M25551 Pain in right hip: Secondary | ICD-10-CM | POA: Diagnosis not present

## 2015-01-13 DIAGNOSIS — M5417 Radiculopathy, lumbosacral region: Secondary | ICD-10-CM | POA: Diagnosis not present

## 2015-01-13 DIAGNOSIS — R201 Hypoesthesia of skin: Secondary | ICD-10-CM | POA: Diagnosis not present

## 2015-01-13 DIAGNOSIS — M545 Low back pain: Secondary | ICD-10-CM | POA: Diagnosis not present

## 2015-01-13 DIAGNOSIS — M5137 Other intervertebral disc degeneration, lumbosacral region: Secondary | ICD-10-CM | POA: Diagnosis not present

## 2015-01-13 DIAGNOSIS — M4726 Other spondylosis with radiculopathy, lumbar region: Secondary | ICD-10-CM | POA: Diagnosis not present

## 2015-01-13 DIAGNOSIS — M6240 Contracture of muscle, unspecified site: Secondary | ICD-10-CM | POA: Diagnosis not present

## 2015-01-16 ENCOUNTER — Encounter: Payer: Self-pay | Admitting: *Deleted

## 2015-01-17 DIAGNOSIS — M4726 Other spondylosis with radiculopathy, lumbar region: Secondary | ICD-10-CM | POA: Diagnosis not present

## 2015-01-17 DIAGNOSIS — R293 Abnormal posture: Secondary | ICD-10-CM | POA: Diagnosis not present

## 2015-01-17 DIAGNOSIS — M791 Myalgia: Secondary | ICD-10-CM | POA: Diagnosis not present

## 2015-01-17 DIAGNOSIS — M25551 Pain in right hip: Secondary | ICD-10-CM | POA: Diagnosis not present

## 2015-01-17 DIAGNOSIS — M545 Low back pain: Secondary | ICD-10-CM | POA: Diagnosis not present

## 2015-01-17 DIAGNOSIS — R201 Hypoesthesia of skin: Secondary | ICD-10-CM | POA: Diagnosis not present

## 2015-01-17 DIAGNOSIS — M9903 Segmental and somatic dysfunction of lumbar region: Secondary | ICD-10-CM | POA: Diagnosis not present

## 2015-01-17 DIAGNOSIS — M6240 Contracture of muscle, unspecified site: Secondary | ICD-10-CM | POA: Diagnosis not present

## 2015-01-17 DIAGNOSIS — M5137 Other intervertebral disc degeneration, lumbosacral region: Secondary | ICD-10-CM | POA: Diagnosis not present

## 2015-01-17 DIAGNOSIS — M5417 Radiculopathy, lumbosacral region: Secondary | ICD-10-CM | POA: Diagnosis not present

## 2015-01-18 DIAGNOSIS — R293 Abnormal posture: Secondary | ICD-10-CM | POA: Diagnosis not present

## 2015-01-18 DIAGNOSIS — M6240 Contracture of muscle, unspecified site: Secondary | ICD-10-CM | POA: Diagnosis not present

## 2015-01-18 DIAGNOSIS — M5137 Other intervertebral disc degeneration, lumbosacral region: Secondary | ICD-10-CM | POA: Diagnosis not present

## 2015-01-18 DIAGNOSIS — M9903 Segmental and somatic dysfunction of lumbar region: Secondary | ICD-10-CM | POA: Diagnosis not present

## 2015-01-20 DIAGNOSIS — M9903 Segmental and somatic dysfunction of lumbar region: Secondary | ICD-10-CM | POA: Diagnosis not present

## 2015-01-20 DIAGNOSIS — M791 Myalgia: Secondary | ICD-10-CM | POA: Diagnosis not present

## 2015-01-20 DIAGNOSIS — M4726 Other spondylosis with radiculopathy, lumbar region: Secondary | ICD-10-CM | POA: Diagnosis not present

## 2015-01-20 DIAGNOSIS — M5417 Radiculopathy, lumbosacral region: Secondary | ICD-10-CM | POA: Diagnosis not present

## 2015-01-20 DIAGNOSIS — M25551 Pain in right hip: Secondary | ICD-10-CM | POA: Diagnosis not present

## 2015-01-20 DIAGNOSIS — M6240 Contracture of muscle, unspecified site: Secondary | ICD-10-CM | POA: Diagnosis not present

## 2015-01-20 DIAGNOSIS — M545 Low back pain: Secondary | ICD-10-CM | POA: Diagnosis not present

## 2015-01-20 DIAGNOSIS — M5137 Other intervertebral disc degeneration, lumbosacral region: Secondary | ICD-10-CM | POA: Diagnosis not present

## 2015-01-20 DIAGNOSIS — R293 Abnormal posture: Secondary | ICD-10-CM | POA: Diagnosis not present

## 2015-01-20 DIAGNOSIS — R201 Hypoesthesia of skin: Secondary | ICD-10-CM | POA: Diagnosis not present

## 2015-01-23 DIAGNOSIS — M5416 Radiculopathy, lumbar region: Secondary | ICD-10-CM | POA: Diagnosis not present

## 2015-01-23 DIAGNOSIS — M545 Low back pain: Secondary | ICD-10-CM | POA: Diagnosis not present

## 2015-01-23 DIAGNOSIS — M9903 Segmental and somatic dysfunction of lumbar region: Secondary | ICD-10-CM | POA: Diagnosis not present

## 2015-01-23 DIAGNOSIS — M5137 Other intervertebral disc degeneration, lumbosacral region: Secondary | ICD-10-CM | POA: Diagnosis not present

## 2015-01-23 DIAGNOSIS — M6240 Contracture of muscle, unspecified site: Secondary | ICD-10-CM | POA: Diagnosis not present

## 2015-01-23 DIAGNOSIS — R293 Abnormal posture: Secondary | ICD-10-CM | POA: Diagnosis not present

## 2015-01-23 DIAGNOSIS — M5127 Other intervertebral disc displacement, lumbosacral region: Secondary | ICD-10-CM | POA: Diagnosis not present

## 2015-01-23 DIAGNOSIS — M791 Myalgia: Secondary | ICD-10-CM | POA: Diagnosis not present

## 2015-01-25 DIAGNOSIS — M6240 Contracture of muscle, unspecified site: Secondary | ICD-10-CM | POA: Diagnosis not present

## 2015-01-25 DIAGNOSIS — M5416 Radiculopathy, lumbar region: Secondary | ICD-10-CM | POA: Diagnosis not present

## 2015-01-25 DIAGNOSIS — M791 Myalgia: Secondary | ICD-10-CM | POA: Diagnosis not present

## 2015-01-25 DIAGNOSIS — M9903 Segmental and somatic dysfunction of lumbar region: Secondary | ICD-10-CM | POA: Diagnosis not present

## 2015-01-25 DIAGNOSIS — M5137 Other intervertebral disc degeneration, lumbosacral region: Secondary | ICD-10-CM | POA: Diagnosis not present

## 2015-01-25 DIAGNOSIS — M5127 Other intervertebral disc displacement, lumbosacral region: Secondary | ICD-10-CM | POA: Diagnosis not present

## 2015-01-25 DIAGNOSIS — M545 Low back pain: Secondary | ICD-10-CM | POA: Diagnosis not present

## 2015-01-25 DIAGNOSIS — R293 Abnormal posture: Secondary | ICD-10-CM | POA: Diagnosis not present

## 2015-01-27 DIAGNOSIS — M5137 Other intervertebral disc degeneration, lumbosacral region: Secondary | ICD-10-CM | POA: Diagnosis not present

## 2015-01-27 DIAGNOSIS — M545 Low back pain: Secondary | ICD-10-CM | POA: Diagnosis not present

## 2015-01-27 DIAGNOSIS — M5416 Radiculopathy, lumbar region: Secondary | ICD-10-CM | POA: Diagnosis not present

## 2015-01-27 DIAGNOSIS — M791 Myalgia: Secondary | ICD-10-CM | POA: Diagnosis not present

## 2015-01-27 DIAGNOSIS — R293 Abnormal posture: Secondary | ICD-10-CM | POA: Diagnosis not present

## 2015-01-27 DIAGNOSIS — M9903 Segmental and somatic dysfunction of lumbar region: Secondary | ICD-10-CM | POA: Diagnosis not present

## 2015-01-27 DIAGNOSIS — M5127 Other intervertebral disc displacement, lumbosacral region: Secondary | ICD-10-CM | POA: Diagnosis not present

## 2015-01-27 DIAGNOSIS — M6240 Contracture of muscle, unspecified site: Secondary | ICD-10-CM | POA: Diagnosis not present

## 2015-01-30 DIAGNOSIS — M5127 Other intervertebral disc displacement, lumbosacral region: Secondary | ICD-10-CM | POA: Diagnosis not present

## 2015-01-30 DIAGNOSIS — M9903 Segmental and somatic dysfunction of lumbar region: Secondary | ICD-10-CM | POA: Diagnosis not present

## 2015-01-30 DIAGNOSIS — M6240 Contracture of muscle, unspecified site: Secondary | ICD-10-CM | POA: Diagnosis not present

## 2015-01-30 DIAGNOSIS — M791 Myalgia: Secondary | ICD-10-CM | POA: Diagnosis not present

## 2015-01-30 DIAGNOSIS — R293 Abnormal posture: Secondary | ICD-10-CM | POA: Diagnosis not present

## 2015-01-30 DIAGNOSIS — M5416 Radiculopathy, lumbar region: Secondary | ICD-10-CM | POA: Diagnosis not present

## 2015-01-30 DIAGNOSIS — M545 Low back pain: Secondary | ICD-10-CM | POA: Diagnosis not present

## 2015-01-30 DIAGNOSIS — M5137 Other intervertebral disc degeneration, lumbosacral region: Secondary | ICD-10-CM | POA: Diagnosis not present

## 2015-02-01 DIAGNOSIS — M9903 Segmental and somatic dysfunction of lumbar region: Secondary | ICD-10-CM | POA: Diagnosis not present

## 2015-02-01 DIAGNOSIS — M5416 Radiculopathy, lumbar region: Secondary | ICD-10-CM | POA: Diagnosis not present

## 2015-02-01 DIAGNOSIS — R293 Abnormal posture: Secondary | ICD-10-CM | POA: Diagnosis not present

## 2015-02-01 DIAGNOSIS — M5137 Other intervertebral disc degeneration, lumbosacral region: Secondary | ICD-10-CM | POA: Diagnosis not present

## 2015-02-01 DIAGNOSIS — M6240 Contracture of muscle, unspecified site: Secondary | ICD-10-CM | POA: Diagnosis not present

## 2015-02-01 DIAGNOSIS — M545 Low back pain: Secondary | ICD-10-CM | POA: Diagnosis not present

## 2015-02-01 DIAGNOSIS — M791 Myalgia: Secondary | ICD-10-CM | POA: Diagnosis not present

## 2015-02-01 DIAGNOSIS — M5127 Other intervertebral disc displacement, lumbosacral region: Secondary | ICD-10-CM | POA: Diagnosis not present

## 2015-02-03 DIAGNOSIS — M6240 Contracture of muscle, unspecified site: Secondary | ICD-10-CM | POA: Diagnosis not present

## 2015-02-03 DIAGNOSIS — M5127 Other intervertebral disc displacement, lumbosacral region: Secondary | ICD-10-CM | POA: Diagnosis not present

## 2015-02-03 DIAGNOSIS — M791 Myalgia: Secondary | ICD-10-CM | POA: Diagnosis not present

## 2015-02-03 DIAGNOSIS — M5416 Radiculopathy, lumbar region: Secondary | ICD-10-CM | POA: Diagnosis not present

## 2015-02-03 DIAGNOSIS — M545 Low back pain: Secondary | ICD-10-CM | POA: Diagnosis not present

## 2015-02-03 DIAGNOSIS — M5137 Other intervertebral disc degeneration, lumbosacral region: Secondary | ICD-10-CM | POA: Diagnosis not present

## 2015-02-03 DIAGNOSIS — M9903 Segmental and somatic dysfunction of lumbar region: Secondary | ICD-10-CM | POA: Diagnosis not present

## 2015-02-03 DIAGNOSIS — R293 Abnormal posture: Secondary | ICD-10-CM | POA: Diagnosis not present

## 2015-02-06 DIAGNOSIS — M5127 Other intervertebral disc displacement, lumbosacral region: Secondary | ICD-10-CM | POA: Diagnosis not present

## 2015-02-06 DIAGNOSIS — M5416 Radiculopathy, lumbar region: Secondary | ICD-10-CM | POA: Diagnosis not present

## 2015-02-06 DIAGNOSIS — M545 Low back pain: Secondary | ICD-10-CM | POA: Diagnosis not present

## 2015-02-06 DIAGNOSIS — M5137 Other intervertebral disc degeneration, lumbosacral region: Secondary | ICD-10-CM | POA: Diagnosis not present

## 2015-02-06 DIAGNOSIS — M6240 Contracture of muscle, unspecified site: Secondary | ICD-10-CM | POA: Diagnosis not present

## 2015-02-06 DIAGNOSIS — M9903 Segmental and somatic dysfunction of lumbar region: Secondary | ICD-10-CM | POA: Diagnosis not present

## 2015-02-06 DIAGNOSIS — R293 Abnormal posture: Secondary | ICD-10-CM | POA: Diagnosis not present

## 2015-02-06 DIAGNOSIS — M791 Myalgia: Secondary | ICD-10-CM | POA: Diagnosis not present

## 2015-02-08 DIAGNOSIS — R293 Abnormal posture: Secondary | ICD-10-CM | POA: Diagnosis not present

## 2015-02-08 DIAGNOSIS — M9903 Segmental and somatic dysfunction of lumbar region: Secondary | ICD-10-CM | POA: Diagnosis not present

## 2015-02-08 DIAGNOSIS — M5416 Radiculopathy, lumbar region: Secondary | ICD-10-CM | POA: Diagnosis not present

## 2015-02-08 DIAGNOSIS — M6240 Contracture of muscle, unspecified site: Secondary | ICD-10-CM | POA: Diagnosis not present

## 2015-02-08 DIAGNOSIS — M791 Myalgia: Secondary | ICD-10-CM | POA: Diagnosis not present

## 2015-02-08 DIAGNOSIS — M5127 Other intervertebral disc displacement, lumbosacral region: Secondary | ICD-10-CM | POA: Diagnosis not present

## 2015-02-08 DIAGNOSIS — M545 Low back pain: Secondary | ICD-10-CM | POA: Diagnosis not present

## 2015-02-08 DIAGNOSIS — M5137 Other intervertebral disc degeneration, lumbosacral region: Secondary | ICD-10-CM | POA: Diagnosis not present

## 2015-02-09 ENCOUNTER — Encounter: Payer: Self-pay | Admitting: Internal Medicine

## 2015-02-09 ENCOUNTER — Ambulatory Visit (INDEPENDENT_AMBULATORY_CARE_PROVIDER_SITE_OTHER): Payer: Medicare Other | Admitting: Internal Medicine

## 2015-02-09 VITALS — BP 152/90 | HR 82 | Temp 98.2°F | Resp 16 | Ht 62.25 in | Wt 127.0 lb

## 2015-02-09 DIAGNOSIS — R7303 Prediabetes: Secondary | ICD-10-CM

## 2015-02-09 DIAGNOSIS — Z79899 Other long term (current) drug therapy: Secondary | ICD-10-CM | POA: Diagnosis not present

## 2015-02-09 DIAGNOSIS — R7309 Other abnormal glucose: Secondary | ICD-10-CM

## 2015-02-09 DIAGNOSIS — E782 Mixed hyperlipidemia: Secondary | ICD-10-CM

## 2015-02-09 DIAGNOSIS — E559 Vitamin D deficiency, unspecified: Secondary | ICD-10-CM

## 2015-02-09 DIAGNOSIS — I10 Essential (primary) hypertension: Secondary | ICD-10-CM | POA: Diagnosis not present

## 2015-02-09 LAB — HEPATIC FUNCTION PANEL
ALT: 13 U/L (ref 6–29)
AST: 15 U/L (ref 10–35)
Albumin: 4 g/dL (ref 3.6–5.1)
Alkaline Phosphatase: 45 U/L (ref 33–130)
BILIRUBIN INDIRECT: 0.5 mg/dL (ref 0.2–1.2)
BILIRUBIN TOTAL: 0.6 mg/dL (ref 0.2–1.2)
Bilirubin, Direct: 0.1 mg/dL (ref <=0.2)
Total Protein: 6.6 g/dL (ref 6.1–8.1)

## 2015-02-09 LAB — BASIC METABOLIC PANEL WITH GFR
BUN: 16 mg/dL (ref 7–25)
CALCIUM: 9.6 mg/dL (ref 8.6–10.4)
CO2: 24 mEq/L (ref 20–31)
Chloride: 102 mEq/L (ref 98–110)
Creat: 0.65 mg/dL (ref 0.60–0.88)
GFR, Est Non African American: 81 mL/min (ref >=60)
Glucose, Bld: 82 mg/dL (ref 65–99)
Potassium: 4.6 mEq/L (ref 3.5–5.3)
Sodium: 137 mEq/L (ref 135–146)

## 2015-02-09 LAB — CBC WITH DIFFERENTIAL/PLATELET
BASOS PCT: 1 % (ref 0–1)
Basophils Absolute: 0.1 10*3/uL (ref 0.0–0.1)
Eosinophils Absolute: 0.1 10*3/uL (ref 0.0–0.7)
Eosinophils Relative: 2 % (ref 0–5)
HCT: 44.9 % (ref 36.0–46.0)
HEMOGLOBIN: 15.1 g/dL — AB (ref 12.0–15.0)
LYMPHS PCT: 20 % (ref 12–46)
Lymphs Abs: 1.4 10*3/uL (ref 0.7–4.0)
MCH: 30.6 pg (ref 26.0–34.0)
MCHC: 33.6 g/dL (ref 30.0–36.0)
MCV: 91.1 fL (ref 78.0–100.0)
MONO ABS: 0.6 10*3/uL (ref 0.1–1.0)
MONOS PCT: 9 % (ref 3–12)
MPV: 9.2 fL (ref 8.6–12.4)
Neutro Abs: 4.8 10*3/uL (ref 1.7–7.7)
Neutrophils Relative %: 68 % (ref 43–77)
Platelets: 360 10*3/uL (ref 150–400)
RBC: 4.93 MIL/uL (ref 3.87–5.11)
RDW: 13.8 % (ref 11.5–15.5)
WBC: 7 10*3/uL (ref 4.0–10.5)

## 2015-02-09 LAB — HEMOGLOBIN A1C
HEMOGLOBIN A1C: 5.8 % — AB (ref <5.7)
MEAN PLASMA GLUCOSE: 120 mg/dL — AB (ref <117)

## 2015-02-09 LAB — LIPID PANEL
Cholesterol: 185 mg/dL (ref 125–200)
HDL: 65 mg/dL (ref >=46)
LDL CALC: 95 mg/dL (ref <130)
Total CHOL/HDL Ratio: 2.8 Ratio (ref <=5.0)
Triglycerides: 125 mg/dL (ref <150)
VLDL: 25 mg/dL (ref <30)

## 2015-02-09 LAB — TSH: TSH: 2.209 u[IU]/mL (ref 0.350–4.500)

## 2015-02-09 LAB — MAGNESIUM: Magnesium: 2.3 mg/dL (ref 1.5–2.5)

## 2015-02-09 NOTE — Progress Notes (Signed)
Patient ID: Leslie [name], female   DOB: 02/10/1927, 81 y.o.   MRN: [id number]  Assessment and Plan:  Hypertension:  -Continue medication -patient reports BP of 120-130/60-70 at home, will continue to monitor.   -monitor blood pressure at home.  -Continue DASH diet.   -Reminder to go to the ER if any CP, SOB, nausea, dizziness, severe HA, changes vision/speech, left arm numbness and tingling, and jaw pain.  Cholesterol: -Continue diet and exercise.  -Check cholesterol.   Pre-diabetes: -Continue diet and exercise.  -Check A1C  Vitamin D Def: -check level -continue medications.   Insomnia -either take a whole ativan tablet  -or continue with a half tablet and try melatonin.     Continue diet and meds as discussed. Further disposition pending results of labs.  HPI 81 y.o. female  presents for 3 month follow up with hypertension, hyperlipidemia, prediabetes and vitamin D.   Her blood pressure has been controlled at home, today their BP is BP: (!) 152/90 mmHg.   She does workout. She denies chest pain, shortness of breath, dizziness.   She is on cholesterol medication and denies myalgias. Her cholesterol is at goal. The cholesterol last visit was:   Lab Results  Component Value Date   CHOL 183 11/08/2014   HDL 65 11/08/2014   LDLCALC 88 11/08/2014   TRIG 151* 11/08/2014   CHOLHDL 2.8 11/08/2014     She has been working on diet and exercise for prediabetes, and denies foot ulcerations, hyperglycemia, hypoglycemia , increased appetite, nausea, paresthesia of the feet, polydipsia, polyuria, visual disturbances, vomiting and weight loss. Last A1C in the office was:  Lab Results  Component Value Date   HGBA1C 5.9* 11/08/2014    Patient is on Vitamin D supplement.  Lab Results  Component Value Date   VD25OH 28* 11/08/2014      Current Medications:  Current Outpatient Prescriptions on File Prior to Visit  Medication Sig Dispense Refill  . estradiol (ESTRACE) 1 MG tablet  Take 1 tablet (1 mg total) by mouth daily. 90 tablet 4  . Flaxseed, Linseed, (FLAXSEED OIL) 1000 MG CAPS Take 1,000 mg by mouth daily.    . Lidocaine-Hydrocortisone Ace 2.8-0.55 % GEL Use thin coat of gel as needed for rectal discomfort up to 3 times daily 100 g 0  . LORazepam (ATIVAN) 1 MG tablet 1/2 to 1  tab up to 3 x daily as needed for anxiety or sleep 90 tablet 5  . magnesium oxide (MAG-OX) 400 MG tablet Take 400 mg by mouth daily.    . Multiple Vitamin (MULTIVITAMIN) tablet Take 1 tablet by mouth daily.    . traMADol (ULTRAM) 50 MG tablet Take 25 mg by mouth every 6 (six) hours as needed.     No current facility-administered medications on file prior to visit.    Medical History:  Past Medical History  Diagnosis Date  . Chest pain   . Essential hypertension, benign   . Other left bundle branch block   . Other premature beats   . GERD (gastroesophageal reflux disease)   . Hyperlipidemia   . Pre-diabetes   . DDD (degenerative disc disease)   . OA (osteoarthritis)   . Glaucoma     Allergies:  Allergies  Allergen Reactions  . Adhesive [Tape]   . Advil [Ibuprofen]   . Atenolol      Review of Systems:  Review of Systems  Constitutional: Negative for fever, chills and malaise/fatigue.  HENT: Negative for congestion, ear pain  and sore throat.   Eyes: Negative for blurred vision and double vision.  Respiratory: Negative for cough, shortness of breath and wheezing.   Cardiovascular: Negative for chest pain, palpitations and leg swelling.  Gastrointestinal: Negative for heartburn, diarrhea, constipation, blood in stool and melena.  Genitourinary: Negative for dysuria, urgency and frequency.  Musculoskeletal: Positive for back pain (going to chiropracter).  Skin: Negative.   Neurological: Negative for dizziness, sensory change, loss of consciousness and headaches.  Psychiatric/Behavioral: Negative for depression. The patient has insomnia. The patient is not nervous/anxious.      Family history- Review and unchanged  Social history- Review and unchanged  Physical Exam: BP 152/90 mmHg  Pulse 82  Temp(Src) 98.2 F (36.8 C) (Temporal)  Resp 16  Ht 5' 2.25" (1.581 m)  Wt 127 lb (57.607 kg)  BMI 23.05 kg/m2 Wt Readings from Last 3 Encounters:  02/09/15 127 lb (57.607 kg)  11/22/14 130 lb (58.968 kg)  11/08/14 130 lb 3.2 oz (59.058 kg)    General Appearance: Well nourished well developed, in no apparent distress. Eyes: PERRLA, EOMs, conjunctiva no swelling or erythema ENT/Mouth: Ear canals normal without obstruction, swelling, erythma, discharge.  TMs normal bilaterally.  Oropharynx moist, clear, without exudate, or postoropharyngeal swelling. Neck: Supple, thyroid normal,no cervical adenopathy  Respiratory: Respiratory effort normal, Breath sounds clear A&P without rhonchi, wheeze, or rale.  No retractions, no accessory usage. Cardio: RRR with no MRGs. Brisk peripheral pulses without edema.  Abdomen: Soft, + BS,  Non tender, no guarding, rebound, hernias, masses. Musculoskeletal: Full ROM, 5/5 strength, Normal gait Skin: Warm, dry without rashes, lesions, ecchymosis.  Neuro: Awake and oriented X 3, Cranial nerves intact. Normal muscle tone, no cerebellar symptoms. Psych: Normal affect, Insight and Judgment appropriate.    Terri Piedra, PA-C 11:00 AM [facility]

## 2015-02-09 NOTE — Patient Instructions (Signed)
Melatonin oral capsules and tablets What is this medicine? MELATONIN (mel uh TOH nin) is a dietary supplement. It is promoted to help maintain normal sleep patterns. The FDA has not approved this supplement for any medical use. This supplement may be used for other purposes; ask your health care provider or pharmacist if you have questions. This medicine may be used for other purposes; ask your health care provider or pharmacist if you have questions. COMMON BRAND NAME(S): Melatonex What should I tell my health care provider before I take this medicine? They need to know if you have any of these conditions: -cancer -if you frequently drink alcohol containing drinks -immune system problems -liver disease -seizure disorder -an unusual or allergic reaction to melatonin, other medicines, foods, dyes, or preservatives -pregnant or trying to get pregnant -breast-feeding How should I use this medicine? Take this supplement by mouth with a glass of water. This supplement is usually taken prior to bedtime. Do not chew or crush most tablets or capsules. Some tablets are chewable and are chewed before swallowing. Some tablets are meant to be dissolved in the mouth or under the tongue. Follow the directions on the package labeling, or take as directed by your health care professional. Do not take this supplement more often than directed. Talk to your pediatrician regarding the use of this supplement in children. This supplement is not recommended for use in children. Overdosage: If you think you have taken too much of this medicine contact a poison control center or emergency room at once. NOTE: This medicine is only for you. Do not share this medicine with others. What if I miss a dose? This does not apply; this medicine is not for regular use. Do not take double or extra doses. What may interact with this medicine? Check with your doctor or healthcare professional if you are taking any of the following  medications: -hormone medicines -medicines for blood pressure like nifedipine -medications for anxiety, depression, or other emotional or psychiatric problems -medications for seizures -medications for sleep -other herbal or dietary supplements -tamoxifen -treatments for cancer or immune disorders This list may not describe all possible interactions. Give your health care provider a list of all the medicines, herbs, non-prescription drugs, or dietary supplements you use. Also tell them if you smoke, drink alcohol, or use illegal drugs. Some items may interact with your medicine. What should I watch for while using this medicine? See your doctor if your symptoms do not get better or if they get worse. Do not take this supplement for more than 2 weeks unless your doctor tells you to. You may get drowsy or dizzy. Do not drive, use machinery, or do anything that needs mental alertness until you know how this medicine affects you. Do not stand or sit up quickly, especially if you are an older patient. This reduces the risk of dizzy or fainting spells. Alcohol may interfere with the effect of this medicine. Avoid alcoholic drinks. Talk to your doctor before you use this supplement if you are currently being treated for an emotional, mental, or sleep problem. This medicine may interfere with your treatment. Herbal or dietary supplements are not regulated like medicines. Rigid quality control standards are not required for dietary supplements. The purity and strength of these products can vary. The safety and effect of this dietary supplement for a certain disease or illness is not well known. This product is not intended to diagnose, treat, cure or prevent any disease. The Food and Drug Administration   suggests the following to help consumers protect themselves: -Always read product labels and follow directions. -Natural does not mean a product is safe for humans to take. -Look for products that include  USP after the ingredient name. This means that the manufacturer followed the standards of the [place] Pharmacopoeia. -Supplements made or sold by a nationally known food or drug company are more likely to be made under tight controls. You can write to the company for more information about how the product was made. What side effects may I notice from receiving this medicine? Side effects that you should report to your doctor or health care professional as soon as possible: -allergic reactions like skin rash, itching or hives, swelling of the face, lips, or tongue -breathing problems -confusion, forgetful -depressed, nervous, or other mood changes -fast or pounding heartbeat -trouble staying awake or alert during the day Side effects that usually do not require medical attention (report to your doctor or health care professional if they continue or are bothersome): -drowsiness, dizziness -headache -nightmares -upset stomach This list may not describe all possible side effects. Call your doctor for medical advice about side effects. You may report side effects to FDA at 1-800-FDA-1088. Where should I keep my medicine? Keep out of the reach of children. Store at room temperature or as directed on the package label. Protect from moisture. Throw away any unused supplement after the expiration date. NOTE: This sheet is a summary. It may not cover all possible information. If you have questions about this medicine, talk to your doctor, pharmacist, or health care provider.  2015, Elsevier/Gold Standard. (2013-05-14 18:36:27)  

## 2015-02-10 DIAGNOSIS — M791 Myalgia: Secondary | ICD-10-CM | POA: Diagnosis not present

## 2015-02-10 DIAGNOSIS — M5416 Radiculopathy, lumbar region: Secondary | ICD-10-CM | POA: Diagnosis not present

## 2015-02-10 DIAGNOSIS — M6240 Contracture of muscle, unspecified site: Secondary | ICD-10-CM | POA: Diagnosis not present

## 2015-02-10 DIAGNOSIS — M5137 Other intervertebral disc degeneration, lumbosacral region: Secondary | ICD-10-CM | POA: Diagnosis not present

## 2015-02-10 DIAGNOSIS — M5127 Other intervertebral disc displacement, lumbosacral region: Secondary | ICD-10-CM | POA: Diagnosis not present

## 2015-02-10 DIAGNOSIS — R293 Abnormal posture: Secondary | ICD-10-CM | POA: Diagnosis not present

## 2015-02-10 DIAGNOSIS — M545 Low back pain: Secondary | ICD-10-CM | POA: Diagnosis not present

## 2015-02-10 DIAGNOSIS — M9903 Segmental and somatic dysfunction of lumbar region: Secondary | ICD-10-CM | POA: Diagnosis not present

## 2015-02-10 LAB — INSULIN, RANDOM: Insulin: 6.5 u[IU]/mL (ref 2.0–19.6)

## 2015-02-10 LAB — VITAMIN D 25 HYDROXY (VIT D DEFICIENCY, FRACTURES): VIT D 25 HYDROXY: 25 ng/mL — AB (ref 30–100)

## 2015-02-13 DIAGNOSIS — M9903 Segmental and somatic dysfunction of lumbar region: Secondary | ICD-10-CM | POA: Diagnosis not present

## 2015-02-13 DIAGNOSIS — M5127 Other intervertebral disc displacement, lumbosacral region: Secondary | ICD-10-CM | POA: Diagnosis not present

## 2015-02-13 DIAGNOSIS — M6240 Contracture of muscle, unspecified site: Secondary | ICD-10-CM | POA: Diagnosis not present

## 2015-02-13 DIAGNOSIS — M791 Myalgia: Secondary | ICD-10-CM | POA: Diagnosis not present

## 2015-02-13 DIAGNOSIS — M5137 Other intervertebral disc degeneration, lumbosacral region: Secondary | ICD-10-CM | POA: Diagnosis not present

## 2015-02-13 DIAGNOSIS — M5416 Radiculopathy, lumbar region: Secondary | ICD-10-CM | POA: Diagnosis not present

## 2015-02-13 DIAGNOSIS — R293 Abnormal posture: Secondary | ICD-10-CM | POA: Diagnosis not present

## 2015-02-13 DIAGNOSIS — M545 Low back pain: Secondary | ICD-10-CM | POA: Diagnosis not present

## 2015-02-15 DIAGNOSIS — M791 Myalgia: Secondary | ICD-10-CM | POA: Diagnosis not present

## 2015-02-15 DIAGNOSIS — M6240 Contracture of muscle, unspecified site: Secondary | ICD-10-CM | POA: Diagnosis not present

## 2015-02-15 DIAGNOSIS — R293 Abnormal posture: Secondary | ICD-10-CM | POA: Diagnosis not present

## 2015-02-15 DIAGNOSIS — M5127 Other intervertebral disc displacement, lumbosacral region: Secondary | ICD-10-CM | POA: Diagnosis not present

## 2015-02-15 DIAGNOSIS — M5137 Other intervertebral disc degeneration, lumbosacral region: Secondary | ICD-10-CM | POA: Diagnosis not present

## 2015-02-15 DIAGNOSIS — M5416 Radiculopathy, lumbar region: Secondary | ICD-10-CM | POA: Diagnosis not present

## 2015-02-15 DIAGNOSIS — M9903 Segmental and somatic dysfunction of lumbar region: Secondary | ICD-10-CM | POA: Diagnosis not present

## 2015-02-15 DIAGNOSIS — M545 Low back pain: Secondary | ICD-10-CM | POA: Diagnosis not present

## 2015-02-17 DIAGNOSIS — R293 Abnormal posture: Secondary | ICD-10-CM | POA: Diagnosis not present

## 2015-02-17 DIAGNOSIS — M9903 Segmental and somatic dysfunction of lumbar region: Secondary | ICD-10-CM | POA: Diagnosis not present

## 2015-02-17 DIAGNOSIS — M5416 Radiculopathy, lumbar region: Secondary | ICD-10-CM | POA: Diagnosis not present

## 2015-02-17 DIAGNOSIS — M791 Myalgia: Secondary | ICD-10-CM | POA: Diagnosis not present

## 2015-02-17 DIAGNOSIS — M6240 Contracture of muscle, unspecified site: Secondary | ICD-10-CM | POA: Diagnosis not present

## 2015-02-17 DIAGNOSIS — M5137 Other intervertebral disc degeneration, lumbosacral region: Secondary | ICD-10-CM | POA: Diagnosis not present

## 2015-02-17 DIAGNOSIS — M545 Low back pain: Secondary | ICD-10-CM | POA: Diagnosis not present

## 2015-02-17 DIAGNOSIS — M5127 Other intervertebral disc displacement, lumbosacral region: Secondary | ICD-10-CM | POA: Diagnosis not present

## 2015-03-09 DIAGNOSIS — N8189 Other female genital prolapse: Secondary | ICD-10-CM | POA: Diagnosis not present

## 2015-03-24 DIAGNOSIS — H2513 Age-related nuclear cataract, bilateral: Secondary | ICD-10-CM | POA: Diagnosis not present

## 2015-04-03 DIAGNOSIS — M9903 Segmental and somatic dysfunction of lumbar region: Secondary | ICD-10-CM | POA: Diagnosis not present

## 2015-04-03 DIAGNOSIS — M5137 Other intervertebral disc degeneration, lumbosacral region: Secondary | ICD-10-CM | POA: Diagnosis not present

## 2015-04-03 DIAGNOSIS — M6240 Contracture of muscle, unspecified site: Secondary | ICD-10-CM | POA: Diagnosis not present

## 2015-04-03 DIAGNOSIS — R293 Abnormal posture: Secondary | ICD-10-CM | POA: Diagnosis not present

## 2015-04-14 ENCOUNTER — Encounter: Payer: Self-pay | Admitting: Internal Medicine

## 2015-04-17 DIAGNOSIS — R293 Abnormal posture: Secondary | ICD-10-CM | POA: Diagnosis not present

## 2015-04-17 DIAGNOSIS — M6240 Contracture of muscle, unspecified site: Secondary | ICD-10-CM | POA: Diagnosis not present

## 2015-04-17 DIAGNOSIS — M9903 Segmental and somatic dysfunction of lumbar region: Secondary | ICD-10-CM | POA: Diagnosis not present

## 2015-04-17 DIAGNOSIS — M5137 Other intervertebral disc degeneration, lumbosacral region: Secondary | ICD-10-CM | POA: Diagnosis not present

## 2015-05-01 DIAGNOSIS — R293 Abnormal posture: Secondary | ICD-10-CM | POA: Diagnosis not present

## 2015-05-01 DIAGNOSIS — M6240 Contracture of muscle, unspecified site: Secondary | ICD-10-CM | POA: Diagnosis not present

## 2015-05-01 DIAGNOSIS — M5137 Other intervertebral disc degeneration, lumbosacral region: Secondary | ICD-10-CM | POA: Diagnosis not present

## 2015-05-01 DIAGNOSIS — M9903 Segmental and somatic dysfunction of lumbar region: Secondary | ICD-10-CM | POA: Diagnosis not present

## 2015-05-15 DIAGNOSIS — M6240 Contracture of muscle, unspecified site: Secondary | ICD-10-CM | POA: Diagnosis not present

## 2015-05-15 DIAGNOSIS — M9903 Segmental and somatic dysfunction of lumbar region: Secondary | ICD-10-CM | POA: Diagnosis not present

## 2015-05-15 DIAGNOSIS — R293 Abnormal posture: Secondary | ICD-10-CM | POA: Diagnosis not present

## 2015-05-15 DIAGNOSIS — M5137 Other intervertebral disc degeneration, lumbosacral region: Secondary | ICD-10-CM | POA: Diagnosis not present

## 2015-05-29 DIAGNOSIS — M5137 Other intervertebral disc degeneration, lumbosacral region: Secondary | ICD-10-CM | POA: Diagnosis not present

## 2015-05-29 DIAGNOSIS — M9903 Segmental and somatic dysfunction of lumbar region: Secondary | ICD-10-CM | POA: Diagnosis not present

## 2015-05-29 DIAGNOSIS — R293 Abnormal posture: Secondary | ICD-10-CM | POA: Diagnosis not present

## 2015-05-29 DIAGNOSIS — M6240 Contracture of muscle, unspecified site: Secondary | ICD-10-CM | POA: Diagnosis not present

## 2015-06-07 ENCOUNTER — Encounter: Payer: Self-pay | Admitting: Internal Medicine

## 2015-06-07 ENCOUNTER — Ambulatory Visit (INDEPENDENT_AMBULATORY_CARE_PROVIDER_SITE_OTHER): Payer: Medicare Other | Admitting: Internal Medicine

## 2015-06-07 VITALS — BP 166/100 | HR 76 | Temp 97.9°F | Resp 16 | Ht 62.0 in | Wt 130.6 lb

## 2015-06-07 DIAGNOSIS — R7309 Other abnormal glucose: Secondary | ICD-10-CM | POA: Diagnosis not present

## 2015-06-07 DIAGNOSIS — I10 Essential (primary) hypertension: Secondary | ICD-10-CM | POA: Diagnosis not present

## 2015-06-07 DIAGNOSIS — E559 Vitamin D deficiency, unspecified: Secondary | ICD-10-CM | POA: Diagnosis not present

## 2015-06-07 DIAGNOSIS — Z789 Other specified health status: Secondary | ICD-10-CM

## 2015-06-07 DIAGNOSIS — Z79899 Other long term (current) drug therapy: Secondary | ICD-10-CM | POA: Diagnosis not present

## 2015-06-07 DIAGNOSIS — R7303 Prediabetes: Secondary | ICD-10-CM

## 2015-06-07 DIAGNOSIS — R6889 Other general symptoms and signs: Secondary | ICD-10-CM | POA: Diagnosis not present

## 2015-06-07 DIAGNOSIS — I447 Left bundle-branch block, unspecified: Secondary | ICD-10-CM

## 2015-06-07 DIAGNOSIS — Z1389 Encounter for screening for other disorder: Secondary | ICD-10-CM | POA: Diagnosis not present

## 2015-06-07 DIAGNOSIS — E782 Mixed hyperlipidemia: Secondary | ICD-10-CM

## 2015-06-07 DIAGNOSIS — Z0001 Encounter for general adult medical examination with abnormal findings: Secondary | ICD-10-CM | POA: Diagnosis not present

## 2015-06-07 DIAGNOSIS — Z1331 Encounter for screening for depression: Secondary | ICD-10-CM

## 2015-06-07 DIAGNOSIS — Z6823 Body mass index (BMI) 23.0-23.9, adult: Secondary | ICD-10-CM

## 2015-06-07 DIAGNOSIS — Z9181 History of falling: Secondary | ICD-10-CM

## 2015-06-07 DIAGNOSIS — Z1212 Encounter for screening for malignant neoplasm of rectum: Secondary | ICD-10-CM

## 2015-06-07 LAB — LIPID PANEL
CHOL/HDL RATIO: 3.3 ratio (ref <=5.0)
CHOLESTEROL: 164 mg/dL (ref 125–200)
HDL: 50 mg/dL (ref >=46)
LDL Cholesterol: 74 mg/dL (ref <130)
Triglycerides: 201 mg/dL — ABNORMAL HIGH (ref <150)
VLDL: 40 mg/dL — ABNORMAL HIGH (ref <30)

## 2015-06-07 LAB — BASIC METABOLIC PANEL WITH GFR
BUN: 14 mg/dL (ref 7–25)
CHLORIDE: 99 mmol/L (ref 98–110)
CO2: 26 mmol/L (ref 20–31)
Calcium: 9.8 mg/dL (ref 8.6–10.4)
Creat: 0.7 mg/dL (ref 0.60–0.88)
GFR, EST NON AFRICAN AMERICAN: 79 mL/min (ref >=60)
GFR, Est African American: >89 mL/min (ref >=60)
Glucose, Bld: 104 mg/dL — ABNORMAL HIGH (ref 65–99)
POTASSIUM: 4.5 mmol/L (ref 3.5–5.3)
SODIUM: 136 mmol/L (ref 135–146)

## 2015-06-07 LAB — CBC WITH DIFFERENTIAL/PLATELET
BASOS PCT: 0 % (ref 0–1)
Basophils Absolute: 0 10*3/uL (ref 0.0–0.1)
Eosinophils Absolute: 0.2 10*3/uL (ref 0.0–0.7)
Eosinophils Relative: 2 % (ref 0–5)
HCT: 44.4 % (ref 36.0–46.0)
HEMOGLOBIN: 14.8 g/dL (ref 12.0–15.0)
Lymphocytes Relative: 14 % (ref 12–46)
Lymphs Abs: 1.1 10*3/uL (ref 0.7–4.0)
MCH: 30.5 pg (ref 26.0–34.0)
MCHC: 33.3 g/dL (ref 30.0–36.0)
MCV: 91.4 fL (ref 78.0–100.0)
MONOS PCT: 6 % (ref 3–12)
MPV: 9.7 fL (ref 8.6–12.4)
Monocytes Absolute: 0.5 10*3/uL (ref 0.1–1.0)
NEUTROS ABS: 6.2 10*3/uL (ref 1.7–7.7)
NEUTROS PCT: 78 % — AB (ref 43–77)
Platelets: 357 10*3/uL (ref 150–400)
RBC: 4.86 MIL/uL (ref 3.87–5.11)
RDW: 13.9 % (ref 11.5–15.5)
WBC: 8 10*3/uL (ref 4.0–10.5)

## 2015-06-07 LAB — HEPATIC FUNCTION PANEL
ALBUMIN: 3.9 g/dL (ref 3.6–5.1)
ALT: 22 U/L (ref 6–29)
AST: 22 U/L (ref 10–35)
Alkaline Phosphatase: 42 U/L (ref 33–130)
BILIRUBIN DIRECT: 0.1 mg/dL (ref <=0.2)
Indirect Bilirubin: 0.3 mg/dL (ref 0.2–1.2)
Total Bilirubin: 0.4 mg/dL (ref 0.2–1.2)
Total Protein: 6.8 g/dL (ref 6.1–8.1)

## 2015-06-07 LAB — MAGNESIUM: MAGNESIUM: 2.3 mg/dL (ref 1.5–2.5)

## 2015-06-07 LAB — HEMOGLOBIN A1C
HEMOGLOBIN A1C: 5.8 % — AB (ref <5.7)
Mean Plasma Glucose: 120 mg/dL — ABNORMAL HIGH (ref <117)

## 2015-06-07 LAB — TSH: TSH: 2.63 u[IU]/mL (ref 0.350–4.500)

## 2015-06-07 NOTE — Patient Instructions (Signed)

## 2015-06-07 NOTE — Progress Notes (Signed)
Patient ID: Leslie [name], female   DOB: [birth year], 81 y.o.   MRN: [id number]  Medicare  Annual  Wellness Visit And  Comprehensive Evaluation & Examination   Assessment:   1. Essential hypertension  - Microalbumin / creatinine urine ratio - EKG 12-Lead - TSH  2. Hyperlipidemia  - Lipid panel - TSH  3. Prediabetes  - Hemoglobin A1c - Insulin, random  4. Vitamin D deficiency  - VITAMIN D 25 Hydroxy   5. LBBB (left bundle branch block)   6. Screening for rectal cancer  - POC Hemoccult Bld/Stl   7. Depression screen   8. At low risk for fall   9. Medication management  - Urinalysis, Routine w reflex microscopic - CBC with Differential/Platelet - BASIC METABOLIC PANEL WITH GFR - Hepatic function panel - Magnesium  10. Other abnormal glucose  - Hemoglobin A1c - Insulin, random  11. Encounter for general adult medical examination with abnormal findings       Plan:   During the course of the visit the patient was educated and counseled about appropriate screening and preventive services including:    Pneumococcal vaccine   Influenza vaccine  Td vaccine  Screening electrocardiogram  Bone densitometry screening  Colorectal cancer screening  Diabetes screening  Glaucoma screening  Nutrition counseling   Advanced directives: requested  Screening recommendations, referrals: Vaccinations:  Immunization History  Administered Date(s) Administered  . DTaP 05/06/2003 - declined booster  . Pneumococcal Polysaccharide-23 05/06/2003  . Zoster 07/16/2007  Influenza vaccine declined Prevnar vaccine declined Shingles vaccine declined Hep B vaccine not indicated  Nutrition assessed and recommended  Colonoscopy declined Recommended yearly ophthalmology/optometry visit for glaucoma screening and checkup Recommended yearly dental visit for hygiene and checkup Advanced directives - declined forms.  Conditions/risks identified: BMI: Discussed  weight loss, diet, and increase physical activity.  Increase physical activity: AHA recommends 150 minutes of physical activity a week.  Medications reviewed Preiabetes is at goal, ACE/ARB therapy: Not indicated Urinary Incontinence is not an issue: discussed non pharmacology and pharmacology options.  Fall risk: low- discussed PT, home fall assessment, medications.   Subjective:      Leslie [name]  presents for [employer] Visit and complete physical.  Date of last medicare wellness visit was 10/06/2013. This very nice 81 y.o. WWF also presents for follow up with Hypertension, Hyperlipidemia, Pre-Diabetes and Vitamin D Deficiency.  This very nice 81 y.o.MWF has been followed in my practice for about 30+ years and presents for presents for an annual wellness visit and comprehensive evaluation, examination and management of multiple medical co-morbidities. Patient has been followed for HTN, Prediabetes, Hyperlipidemia, and Vitamin D Deficiency.   Patient's HTN predates circa 1980, but reports she was able to taper off of meds in June 2014 and monitors BP's 2-3 x/week with readings betw 135-140/70-80 although today's BP was elevated at 166/100 and was 148/88 on recheck. Patient also has CKD 2 with GFR 77 ml/min. Patient has had no complaints of any cardiac type chest pain, palpitations, dyspnea/orthopnea/PND, dizziness, claudication, or dependent edema.   Hyperlipidemia is controlled with diet & meds. Patient denies myalgias or other med SE's. Last Lipids were 02/09/2015: Cholesterol 185; HDL 65; LDL Cholesterol 95; Triglycerides 125 on      Also, the patient has history of PreDiabetes since 2012 with A1c 5.7% and has had no symptoms of reactive hypoglycemia, diabetic polys, paresthesias or visual blurring.  Last A1c was 02/09/2015: Hgb A1c MFr Bld 5.8% on  Further, the patient also has history of Vitamin D Deficiency and supplements vitamin D without any suspected side-effects. Last  vitamin D was still very low at  "25" on  02/09/2015.  Names of Other Physician/Practitioners you currently use: 1. [facility] here for primary care 2. Dr Leslie [name], eye doctor, last visit Aug 2016 & has f/u app't 08/2015. 3. ? 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 2-3 years  Patient Care Team: Leslie Cowboy, MD as PCP - General (Internal Medicine)  Medication Review: Medication Sig  . estradiol (ESTRACE) 1 MG tablet Take 1 tablet (1 mg total) by mouth daily.  . Flaxseed, Linseed, (FLAXSEED OIL) 1000 MG CAPS Take 1,000 mg by mouth daily.  . Lidocaine-Hydrocortisone Ace 2.8-0.55 % GEL Use thin coat of gel as needed for rectal discomfort up to 3 times daily  . LORazepam (ATIVAN) 1 MG tablet 1/2 to 1  tab up to 3 x daily as needed for anxiety or sleep  . magnesium oxide (MAG-OX) 400 MG tablet Take 400 mg by mouth daily.  . Multiple Vitamin (MULTIVITAMIN) tablet Take 1 tablet by mouth daily.  . traMADol (ULTRAM) 50 MG tablet Take 25 mg by mouth every 6 (six) hours as needed.    Allergies  Allergen Reactions  . Adhesive [Tape]   . Advil [Ibuprofen]   . Atenolol    Current Problems (verified) Patient Active Problem List   Diagnosis Date Noted  . Medication management 04/08/2014  . Vitamin D deficiency 07/01/2013  . Prediabetes 07/01/2013  . Hyperlipidemia 07/01/2013  . Essential hypertension 02/20/2010  . LBBB (left bundle branch block) 02/20/2010   Screening Tests Health Maintenance  Topic Date Due  . TETANUS/TDAP  07/17/1945  . DEXA SCAN  07/16/1993  . PNA vac Low Risk Adult (2 of 2 - PCV13) 05/05/2004  . INFLUENZA VACCINE  02/13/2015  . ZOSTAVAX  Completed   Immunization History  Administered Date(s) Administered  . DTaP 05/06/2003  . Pneumococcal Polysaccharide-23 05/06/2003  . Zoster 07/16/2007   Preventative care: Last colonoscopy: Refuses  Past Medical History  Diagnosis Date  . Chest pain   . Essential  hypertension, benign   . Other left bundle branch block   . Other premature beats   . GERD (gastroesophageal reflux disease)   . Hyperlipidemia   . Pre-diabetes   . DDD (degenerative disc disease)   . OA (osteoarthritis)   . Glaucoma    Past Surgical History  Procedure Laterality Date  . Glaucoma surgery  2013  . Tonsillectomy     Risk Factors: Tobacco Social History  Substance Use Topics  . Smoking status: Former Smoker    Quit date: 07/16/1979  . Smokeless tobacco: None  . Alcohol Use: No   She does not smoke.  Patient is a former smoker. Are there smokers in your home (other than you)?  No Alcohol Current alcohol use: none  Caffeine Current caffeine use: coffee 1 cup /day  Exercise Current exercise: housecleaning, walking and yard work  Nutrition/Diet Current diet: in general, a "healthy" diet    Cardiac risk factors: advanced age (older than 56 for men, 67 for women), dyslipidemia, hypertension, sedentary lifestyle and smoking/ tobacco exposure.  Depression Screen (Note: if answer to either of the following is "Yes", a more complete depression screening is indicated)   Q1: Over the past two weeks, have you felt down, depressed or hopeless? No  Q2: Over the past two weeks, have you felt little interest or  pleasure in doing things? No  Have you lost interest or pleasure in daily life? No  Do you often feel hopeless? No  Do you cry easily over simple problems? No  Activities of Daily Living In your present state of health, do you have any difficulty performing the following activities?:  Driving? No Managing money?  No Feeding yourself? No Getting from bed to chair? No Climbing a flight of stairs? No Preparing food and eating?: No Bathing or showering? No Getting dressed: No Getting to the toilet? No Using the toilet:No Moving around from place to place: No In the past year have you fallen or had a near fall?:No   Are you sexually active?  No  Do you  have more than one partner?  No  Vision Difficulties: No  Hearing Difficulties: No Do you often ask people to speak up or repeat themselves? No Do you experience ringing or noises in your ears? No Do you have difficulty understanding soft or whispered voices? Sometimes.  Cognition  Do you feel that you have a problem with memory?No  Do you often misplace items? No  Do you feel safe at home?  Yes  Advanced directives Does patient have a Health Care Power of Attorney? No - declined forms Does patient have a Living Will? No - declined forms  ROS: Constitutional: Denies fever, chills, weight loss/gain, headaches, insomnia, fatigue, night sweats, and change in appetite. Eyes: Denies redness, blurred vision, diplopia, discharge, itchy, watery eyes.  ENT: Denies discharge, congestion, post nasal drip, epistaxis, sore throat, earache, hearing loss, dental pain, Tinnitus, Vertigo, Sinus pain, snoring.  Cardio: Denies chest pain, palpitations, irregular heartbeat, syncope, dyspnea, diaphoresis, orthopnea, PND, claudication, edema Respiratory: denies cough, dyspnea, DOE, pleurisy, hoarseness, laryngitis, wheezing.  Gastrointestinal: Denies dysphagia, heartburn, reflux, water brash, pain, cramps, nausea, vomiting, bloating, diarrhea, constipation, hematemesis, melena, hematochezia, jaundice, hemorrhoids Genitourinary: Denies dysuria, frequency, urgency, nocturia, hesitancy, discharge, hematuria, flank pain Breast: Breast lumps, nipple discharge, bleeding.  Musculoskeletal: Denies arthralgia, myalgia, stiffness, Jt. Swelling, pain, limp, and strain/sprain. Denies falls. Skin: Denies puritis, rash, hives, warts, acne, eczema, changing in skin lesion Neuro: No weakness, tremor, incoordination, spasms, paresthesia, pain Psychiatric: Denies confusion, memory loss, sensory loss. Denies Depression. Endocrine: Denies change in weight, skin, hair change, nocturia, and paresthesia, diabetic polys, visual  blurring, hyper / hypo glycemic episodes.  Heme/Lymph: No excessive bleeding, bruising, enlarged lymph nodes  Objective:     BP 166/100 mmHg  Pulse 76  Temp(Src) 97.9 F (36.6 C)  Resp 16  Ht 5\' 2"  (1.575 m)  Wt 130 lb 9.6 oz (59.24 kg)  BMI 23.88 kg/m2  General Appearance: Well nourished, alert, WD/WN, female and in no apparent distress. Eyes: PERRLA, EOMs, conjunctiva no swelling or erythema, normal fundi and vessels. Sinuses: No frontal/maxillary tenderness ENT/Mouth: EACs patent / TMs  nl. Nares clear without erythema, swelling, mucoid exudates. Oral hygiene is good. No erythema, swelling, or exudate. Tongue normal, non-obstructing. Tonsils not swollen or erythematous. Hearing normal. (+) dentures. Gums appear healthy.  Neck: Supple, thyroid normal. No bruits, nodes or JVD. Respiratory: Respiratory effort normal.  BS equal and clear bilateral without rales, rhonci, wheezing or stridor. Cardio: Heart sounds are normal with regular rate and rhythm and no murmurs, rubs or gallops. Peripheral pulses are normal and equal bilaterally without edema. No aortic or femoral bruits. Chest: symmetric with normal excursions and percussion. Breasts: Symmetric, without lumps, nipple discharge, retractions, or fibrocystic changes.  Abdomen: Flat, soft  with nl bowel sounds. Nontender, no guarding,  rebound, hernias, masses, or organomegaly.  Lymphatics: Non tender without lymphadenopathy.  Musculoskeletal: Full ROM all peripheral extremities, joint stability, 5/5 strength, and normal gait. Skin: Warm and dry without rashes, lesions, cyanosis, clubbing or  ecchymosis.  Neuro: Cranial nerves intact, reflexes equal bilaterally. Normal muscle tone, no cerebellar symptoms. Sensation intact.  Pysch: Alert and oriented X 3, normal affect, Insight and Judgment appropriate.   Cognitive Testing  Alert? Yes  Normal Appearance?Yes  Oriented to person? Yes  Place? Yes   Time? Yes  Recall of three objects?   Yes  Can perform simple calculations? Yes  Displays appropriate judgment? Yes  Can read the correct time from a watch/clock?Yes  Medicare Attestation I have personally reviewed: The patient's medical and social history Their use of alcohol, tobacco or illicit drugs Their current medications and supplements The patient's functional ability including ADLs,fall risks, home safety risks, cognitive, and hearing and visual impairment Diet and physical activities Evidence for depression or mood disorders  The patient's weight, height, BMI, and visual acuity have been recorded in the chart.  I have made referrals, counseling, and provided education to the patient based on review of the above and I have provided the patient with a written personalized care plan for preventive services.  Over 40 minutes of exam, counseling, chart review was performed.  Leslie [name] DAVID, MD   06/07/2015

## 2015-06-08 LAB — URINALYSIS, ROUTINE W REFLEX MICROSCOPIC
Bilirubin Urine: NEGATIVE
GLUCOSE, UA: NEGATIVE
Hgb urine dipstick: NEGATIVE
Ketones, ur: NEGATIVE
LEUKOCYTES UA: NEGATIVE
Nitrite: NEGATIVE
PROTEIN: NEGATIVE
Specific Gravity, Urine: 1.011 (ref 1.001–1.035)
pH: 7.5 (ref 5.0–8.0)

## 2015-06-08 LAB — VITAMIN D 25 HYDROXY (VIT D DEFICIENCY, FRACTURES): VIT D 25 HYDROXY: 27 ng/mL — AB (ref 30–100)

## 2015-06-08 LAB — MICROALBUMIN / CREATININE URINE RATIO
Creatinine, Urine: 44 mg/dL (ref 20–320)
MICROALB/CREAT RATIO: 55 ug/mg{creat} — AB (ref <30)
Microalb, Ur: 2.4 mg/dL

## 2015-06-09 LAB — INSULIN, RANDOM: INSULIN: 29.5 u[IU]/mL — AB (ref 2.0–19.6)

## 2015-06-12 DIAGNOSIS — M9903 Segmental and somatic dysfunction of lumbar region: Secondary | ICD-10-CM | POA: Diagnosis not present

## 2015-06-12 DIAGNOSIS — M5137 Other intervertebral disc degeneration, lumbosacral region: Secondary | ICD-10-CM | POA: Diagnosis not present

## 2015-06-12 DIAGNOSIS — R293 Abnormal posture: Secondary | ICD-10-CM | POA: Diagnosis not present

## 2015-06-12 DIAGNOSIS — M6240 Contracture of muscle, unspecified site: Secondary | ICD-10-CM | POA: Diagnosis not present

## 2015-07-14 ENCOUNTER — Encounter: Payer: Self-pay | Admitting: *Deleted

## 2015-07-27 DIAGNOSIS — H401421 Capsular glaucoma with pseudoexfoliation of lens, left eye, mild stage: Secondary | ICD-10-CM | POA: Diagnosis not present

## 2015-07-27 DIAGNOSIS — H401411 Capsular glaucoma with pseudoexfoliation of lens, right eye, mild stage: Secondary | ICD-10-CM | POA: Diagnosis not present

## 2015-09-25 ENCOUNTER — Encounter: Payer: Self-pay | Admitting: *Deleted

## 2015-10-26 ENCOUNTER — Other Ambulatory Visit: Payer: Self-pay | Admitting: Internal Medicine

## 2015-10-26 NOTE — Telephone Encounter (Signed)
RX CALLED INTO [facility]. 

## 2015-12-12 ENCOUNTER — Ambulatory Visit (INDEPENDENT_AMBULATORY_CARE_PROVIDER_SITE_OTHER): Payer: Medicare Other | Admitting: Internal Medicine

## 2015-12-12 VITALS — BP 144/82 | HR 82 | Temp 98.2°F | Resp 16 | Ht 62.0 in | Wt 126.0 lb

## 2015-12-12 DIAGNOSIS — I10 Essential (primary) hypertension: Secondary | ICD-10-CM

## 2015-12-12 DIAGNOSIS — Z79899 Other long term (current) drug therapy: Secondary | ICD-10-CM

## 2015-12-12 DIAGNOSIS — R7309 Other abnormal glucose: Secondary | ICD-10-CM | POA: Diagnosis not present

## 2015-12-12 DIAGNOSIS — E559 Vitamin D deficiency, unspecified: Secondary | ICD-10-CM | POA: Diagnosis not present

## 2015-12-12 DIAGNOSIS — R7303 Prediabetes: Secondary | ICD-10-CM

## 2015-12-12 DIAGNOSIS — E782 Mixed hyperlipidemia: Secondary | ICD-10-CM | POA: Diagnosis not present

## 2015-12-12 LAB — HEPATIC FUNCTION PANEL
ALT: 10 U/L (ref 6–29)
AST: 15 U/L (ref 10–35)
Albumin: 4.3 g/dL (ref 3.6–5.1)
Alkaline Phosphatase: 38 U/L (ref 33–130)
BILIRUBIN DIRECT: 0.1 mg/dL (ref <=0.2)
BILIRUBIN INDIRECT: 0.5 mg/dL (ref 0.2–1.2)
TOTAL PROTEIN: 6.7 g/dL (ref 6.1–8.1)
Total Bilirubin: 0.6 mg/dL (ref 0.2–1.2)

## 2015-12-12 LAB — LIPID PANEL
Cholesterol: 189 mg/dL (ref 125–200)
HDL: 65 mg/dL (ref >=46)
LDL CALC: 91 mg/dL (ref <130)
Total CHOL/HDL Ratio: 2.9 Ratio (ref <=5.0)
Triglycerides: 167 mg/dL — ABNORMAL HIGH (ref <150)
VLDL: 33 mg/dL — AB (ref <30)

## 2015-12-12 LAB — BASIC METABOLIC PANEL WITH GFR
BUN: 15 mg/dL (ref 7–25)
CHLORIDE: 101 mmol/L (ref 98–110)
CO2: 26 mmol/L (ref 20–31)
Calcium: 9.8 mg/dL (ref 8.6–10.4)
Creat: 0.73 mg/dL (ref 0.60–0.88)
GFR, EST NON AFRICAN AMERICAN: 74 mL/min (ref >=60)
GFR, Est African American: 86 mL/min (ref >=60)
GLUCOSE: 88 mg/dL (ref 65–99)
Potassium: 4.5 mmol/L (ref 3.5–5.3)
SODIUM: 136 mmol/L (ref 135–146)

## 2015-12-12 LAB — HEMOGLOBIN A1C
HEMOGLOBIN A1C: 5.8 % — AB (ref <5.7)
MEAN PLASMA GLUCOSE: 120 mg/dL

## 2015-12-12 LAB — CBC WITH DIFFERENTIAL/PLATELET
BASOS ABS: 97 {cells}/uL (ref 0–200)
Basophils Relative: 1 %
EOS PCT: 1 %
Eosinophils Absolute: 97 cells/uL (ref 15–500)
HEMATOCRIT: 45 % (ref 35.0–45.0)
HEMOGLOBIN: 15 g/dL (ref 11.7–15.5)
LYMPHS ABS: 1164 {cells}/uL (ref 850–3900)
Lymphocytes Relative: 12 %
MCH: 30.5 pg (ref 27.0–33.0)
MCHC: 33.3 g/dL (ref 32.0–36.0)
MCV: 91.6 fL (ref 80.0–100.0)
MONO ABS: 873 {cells}/uL (ref 200–950)
MPV: 9.9 fL (ref 7.5–12.5)
Monocytes Relative: 9 %
NEUTROS ABS: 7469 {cells}/uL (ref 1500–7800)
NEUTROS PCT: 77 %
Platelets: 353 10*3/uL (ref 140–400)
RBC: 4.91 MIL/uL (ref 3.80–5.10)
RDW: 14.1 % (ref 11.0–15.0)
WBC: 9.7 10*3/uL (ref 3.8–10.8)

## 2015-12-12 LAB — TSH: TSH: 2.61 m[IU]/L

## 2015-12-12 MED ORDER — TRAMADOL HCL 50 MG PO TABS: 25.0000 mg | ORAL_TABLET | Freq: Four times a day (QID) | ORAL | Status: DC | PRN

## 2015-12-12 NOTE — Progress Notes (Signed)
Assessment and Plan:  Hypertension:  -Continue medication,  -monitor blood pressure at home.  -Continue DASH diet.   -Reminder to go to the ER if any CP, SOB, nausea, dizziness, severe HA, changes vision/speech, left arm numbness and tingling, and jaw pain.  Cholesterol: -Continue diet and exercise.  -Check cholesterol.   Pre-diabetes: -Continue diet and exercise.  -Check A1C  Vitamin D Def: -check level -continue medications.   DJD -tramadol refill given as last one was given in 2013 -paper script given  Continue diet and meds as discussed. Further disposition pending results of labs.  HPI 82 y.o. female  presents for 3 month follow up with hypertension, hyperlipidemia, prediabetes and vitamin D.   Her blood pressure has been controlled at home, today their BP is BP: (!) 144/82 mmHg.   She does workout. She denies chest pain, shortness of breath, dizziness.  She reports that she is walking 30 minutes 4-5 times per week.     She is not on cholesterol medication and denies myalgias. Her cholesterol is at goal. The cholesterol last visit was:   Lab Results  Component Value Date   CHOL 164 06/07/2015   HDL 50 06/07/2015   LDLCALC 74 06/07/2015   TRIG 201* 06/07/2015   CHOLHDL 3.3 06/07/2015     She has been working on diet and exercise for prediabetes, and denies foot ulcerations, hyperglycemia, hypoglycemia , increased appetite, nausea, paresthesia of the feet, polydipsia, polyuria, visual disturbances, vomiting and weight loss. Last A1C in the office was:  Lab Results  Component Value Date   HGBA1C 5.8* 06/07/2015    Patient is on Vitamin D supplement.  Lab Results  Component Value Date   VD25OH 3727* 06/07/2015     She reports that she is doing very well.   She has no current complaints.  She would like a refill of tramadol as her joints do sometimes ache.  She does take 1 tramadol tablet per week.    Current Medications:  Current Outpatient Prescriptions on File  Prior to Visit  Medication Sig Dispense Refill  . estradiol (ESTRACE) 1 MG tablet Take 1 tablet (1 mg total) by mouth daily. 90 tablet 4  . Flaxseed, Linseed, (FLAXSEED OIL) 1000 MG CAPS Take 1,000 mg by mouth daily.    Marland Kitchen. LORazepam (ATIVAN) 1 MG tablet TAKE ONE TABLET BY MOUTH THREE TIMES DAILY AS NEEDED 90 tablet 0  . magnesium oxide (MAG-OX) 400 MG tablet Take 400 mg by mouth daily.    . Multiple Vitamin (MULTIVITAMIN) tablet Take 1 tablet by mouth daily.    . traMADol (ULTRAM) 50 MG tablet Take 25 mg by mouth every 6 (six) hours as needed.     No current facility-administered medications on file prior to visit.    Medical History:  Past Medical History  Diagnosis Date  . Chest pain   . Essential hypertension, benign   . Other left bundle branch block   . Other premature beats   . GERD (gastroesophageal reflux disease)   . Hyperlipidemia   . Pre-diabetes   . DDD (degenerative disc disease)   . OA (osteoarthritis)   . Glaucoma     Allergies:  Allergies  Allergen Reactions  . Adhesive [Tape]   . Advil [Ibuprofen]   . Atenolol      Review of Systems:  Review of Systems  Constitutional: Negative for fever, chills and malaise/fatigue.  HENT: Negative.   Respiratory: Negative for cough, shortness of breath and wheezing.   Cardiovascular:  Negative for chest pain, palpitations and leg swelling.  Gastrointestinal: Negative for heartburn, abdominal pain, diarrhea, constipation, blood in stool and melena.  Genitourinary: Negative.   Skin: Negative.   Neurological: Negative for dizziness, sensory change and loss of consciousness.  Psychiatric/Behavioral: Negative for depression. The patient is not nervous/anxious and does not have insomnia.     Family history- Review and unchanged  Social history- Review and unchanged  Physical Exam: BP 144/82 mmHg  Pulse 82  Temp(Src) 98.2 F (36.8 C) (Temporal)  Resp 16  Ht  (1.575 m)  Wt 126 lb (57.153 kg)  BMI 23.04  kg/m2 Wt Readings from Last 3 Encounters:  12/12/15 126 lb (57.153 kg)  06/07/15 130 lb 9.6 oz (59.24 kg)  02/09/15 127 lb (57.607 kg)    General Appearance: Well nourished well developed, in no apparent distress. Eyes: PERRLA, EOMs, conjunctiva no swelling or erythema ENT/Mouth: Ear canals normal without obstruction, swelling, erythma, discharge.  TMs normal bilaterally.  Oropharynx moist, clear, without exudate, or postoropharyngeal swelling. Neck: Supple, thyroid normal,no cervical adenopathy  Respiratory: Respiratory effort normal, Breath sounds clear A&P without rhonchi, wheeze, or rale.  No retractions, no accessory usage. Cardio: RRR with no MRGs. Brisk peripheral pulses without edema.  Abdomen: Soft, + BS,  Non tender, no guarding, rebound, hernias, masses. Musculoskeletal: Full ROM, 5/5 strength, Normal gait Skin: Warm, dry without rashes, lesions, ecchymosis.  Neuro: Awake and oriented X 3, Cranial nerves intact. Normal muscle tone, no cerebellar symptoms. Psych: Normal affect, Insight and Judgment appropriate.    Terri Piedra, PA-C 10:11 AM [facility]

## 2016-01-18 ENCOUNTER — Other Ambulatory Visit: Payer: Self-pay | Admitting: Physician Assistant

## 2016-01-19 NOTE — Telephone Encounter (Signed)
RX CALLED INTO [facility]. 

## 2016-01-25 DIAGNOSIS — H401411 Capsular glaucoma with pseudoexfoliation of lens, right eye, mild stage: Secondary | ICD-10-CM | POA: Diagnosis not present

## 2016-02-05 ENCOUNTER — Other Ambulatory Visit: Payer: Self-pay | Admitting: Internal Medicine

## 2016-03-05 DIAGNOSIS — H25011 Cortical age-related cataract, right eye: Secondary | ICD-10-CM | POA: Diagnosis not present

## 2016-03-05 DIAGNOSIS — H25811 Combined forms of age-related cataract, right eye: Secondary | ICD-10-CM | POA: Diagnosis not present

## 2016-03-05 DIAGNOSIS — H2511 Age-related nuclear cataract, right eye: Secondary | ICD-10-CM | POA: Diagnosis not present

## 2016-04-03 DIAGNOSIS — N390 Urinary tract infection, site not specified: Secondary | ICD-10-CM | POA: Diagnosis not present

## 2016-04-03 DIAGNOSIS — R300 Dysuria: Secondary | ICD-10-CM | POA: Diagnosis not present

## 2016-04-05 DIAGNOSIS — R3915 Urgency of urination: Secondary | ICD-10-CM | POA: Diagnosis not present

## 2016-04-05 DIAGNOSIS — N390 Urinary tract infection, site not specified: Secondary | ICD-10-CM | POA: Diagnosis not present

## 2016-04-11 ENCOUNTER — Other Ambulatory Visit: Payer: Self-pay | Admitting: Physician Assistant

## 2016-04-12 DIAGNOSIS — R102 Pelvic and perineal pain: Secondary | ICD-10-CM | POA: Diagnosis not present

## 2016-04-12 DIAGNOSIS — R3915 Urgency of urination: Secondary | ICD-10-CM | POA: Diagnosis not present

## 2016-04-13 ENCOUNTER — Other Ambulatory Visit: Payer: Self-pay | Admitting: Physician Assistant

## 2016-04-15 ENCOUNTER — Other Ambulatory Visit: Payer: Self-pay | Admitting: Physician Assistant

## 2016-05-28 DIAGNOSIS — H5712 Ocular pain, left eye: Secondary | ICD-10-CM | POA: Diagnosis not present

## 2016-05-29 ENCOUNTER — Ambulatory Visit (INDEPENDENT_AMBULATORY_CARE_PROVIDER_SITE_OTHER): Payer: Medicare Other | Admitting: Internal Medicine

## 2016-05-29 ENCOUNTER — Ambulatory Visit: Payer: Self-pay | Admitting: Internal Medicine

## 2016-05-29 ENCOUNTER — Encounter: Payer: Self-pay | Admitting: Internal Medicine

## 2016-05-29 VITALS — BP 158/96 | HR 98 | Temp 98.2°F | Resp 16 | Ht 62.0 in | Wt 120.0 lb

## 2016-05-29 DIAGNOSIS — R7303 Prediabetes: Secondary | ICD-10-CM

## 2016-05-29 DIAGNOSIS — Z0001 Encounter for general adult medical examination with abnormal findings: Secondary | ICD-10-CM | POA: Diagnosis not present

## 2016-05-29 DIAGNOSIS — I10 Essential (primary) hypertension: Secondary | ICD-10-CM

## 2016-05-29 DIAGNOSIS — Z0000 Encounter for general adult medical examination without abnormal findings: Secondary | ICD-10-CM

## 2016-05-29 DIAGNOSIS — Z79899 Other long term (current) drug therapy: Secondary | ICD-10-CM

## 2016-05-29 DIAGNOSIS — Z6823 Body mass index (BMI) 23.0-23.9, adult: Secondary | ICD-10-CM

## 2016-05-29 DIAGNOSIS — R6889 Other general symptoms and signs: Secondary | ICD-10-CM

## 2016-05-29 DIAGNOSIS — I447 Left bundle-branch block, unspecified: Secondary | ICD-10-CM

## 2016-05-29 DIAGNOSIS — E559 Vitamin D deficiency, unspecified: Secondary | ICD-10-CM | POA: Diagnosis not present

## 2016-05-29 DIAGNOSIS — E782 Mixed hyperlipidemia: Secondary | ICD-10-CM | POA: Diagnosis not present

## 2016-05-29 NOTE — Progress Notes (Signed)
MEDICARE ANNUAL WELLNESS VISIT AND FOLLOW UP  Assessment:    1. Essential hypertension -cont monitoring at home -dash diet -exercise as tolerated -cont meds  2. LBBB (left bundle branch block) -cont meds -monitor yearly ekg  3. Body mass index (BMI) of 23.0-23.9 in adult -well controlled BMI at goal  4. Hyperlipidemia -age not appropriate for statins -cont diet and exercise  5. Medication management -biyearly labs  6. Prediabetes -cont a1c monitoring -diet and exercise  7. Vitamin D deficiency -cont Vit D  8. Routine general medical examination at a health care facility -due next year for wellness visit.     Over 30 minutes of exam, counseling, chart review, and critical decision making was performed  Future Appointments Date Time Provider Department Center  07/19/2016 10:00 AM Lucky CowboyWilliam McKeown, MD [facility] None    Plan:   During the course of the visit the patient was educated and counseled about appropriate screening and preventive services including:    Pneumococcal vaccine   Influenza vaccine  Td vaccine  Prevnar 13  Screening electrocardiogram  Screening mammography  Bone densitometry screening  Colorectal cancer screening  Diabetes screening  Glaucoma screening  Nutrition counseling   Advanced directives: given info/requested copies   Subjective:   Leslie [name] is a 82 y.o. female who presents for Medicare Annual Wellness Visit and 3 month follow up on hypertension, prediabetes, hyperlipidemia, vitamin D def.   Her blood pressure has been controlled at home, today their BP is BP: (!) 158/96 She does not workout. She denies chest pain, shortness of breath, dizziness.   She reports that her blood pressure was 140/80 and 130/80 at noon yesterday.  She checks it every week and it is generally normal.  She reports that it is always high here.     She is on cholesterol medication and denies myalgias. Her cholesterol is at goal. The  cholesterol last visit was:  Lab Results  Component Value Date   CHOL 189 12/12/2015   HDL 65 12/12/2015   LDLCALC 91 12/12/2015   TRIG 167 (H) 12/12/2015   CHOLHDL 2.9 12/12/2015   She does have diet controlled prediabetes.  She has had no diabetic symptoms.  She is eating high fiber diet.  Lab Results  Component Value Date   HGBA1C 5.8 (H) 12/12/2015   Last GFR Lab Results  Component Value Date   GFRNONAA 74 12/12/2015   Lab Results  Component Value Date   GFRAA 86 12/12/2015   Patient is on Vitamin D supplement. Lab Results  Component Value Date   VD25OH 27 (L) 06/07/2015       Medication Review Current Outpatient Prescriptions on File Prior to Visit  Medication Sig Dispense Refill  . estradiol (ESTRACE) 1 MG tablet TAKE ONE TABLET BY MOUTH ONCE DAILY 90 tablet 1  . Flaxseed, Linseed, (FLAXSEED OIL) 1000 MG CAPS Take 1,000 mg by mouth daily.    Marland Kitchen. LORazepam (ATIVAN) 1 MG tablet TAKE ONE TABLET BY MOUTH THREE TIMES DAILY AS NEEDED 90 tablet 0  . magnesium oxide (MAG-OX) 400 MG tablet Take 400 mg by mouth daily.    . Multiple Vitamin (MULTIVITAMIN) tablet Take 1 tablet by mouth daily.    . traMADol (ULTRAM) 50 MG tablet Take 0.5 tablets (25 mg total) by mouth every 6 (six) hours as needed. 90 tablet 2   No current facility-administered medications on file prior to visit.     Allergies: Allergies  Allergen Reactions  . Adhesive [Tape]   .  Advil [Ibuprofen]   . Atenolol     Current Problems (verified) has Essential hypertension; LBBB (left bundle branch block); Vitamin D deficiency; Prediabetes; Hyperlipidemia; Medication management; and Body mass index (BMI) of 23.0-23.9 in adult on her problem list.  Screening Tests Immunization History  Administered Date(s) Administered  . DTaP 05/06/2003  . Pneumococcal Polysaccharide-23 05/06/2003  . Zoster 07/16/2007    Preventative care: Last colonoscopy: 2016 Last mammogram: 2012  Prior vaccinations: TD or Tdap:  2004, declines futher due to cost  Influenza: Declines Pneumococcal: 2004 Prevnar13: Declines Shingles/Zostavax: 2009  Names of Other Physician/Practitioners you currently use: 1. [facility]- here for primary care 2. Dr. Judieth KeensLytes, eye doctor, last visit 11/17 3. Dr.  Melynda RippleHobbs , dentist, last visit 2015 Patient Care Team: Lucky CowboyWilliam McKeown, MD as PCP - General (Internal Medicine)  Surgical: She  has a past surgical history that includes Glaucoma surgery (2013) and Tonsillectomy. Family Her family history includes Diabetes in her father; Heart attack in her brother, brother, and father; Heart disease in her brother and brother; Hypertension in her mother; Stroke in her mother. Social history  She reports that she quit smoking about 36 years ago. She does not have any smokeless tobacco history on file. She reports that she does not drink alcohol or use drugs.  MEDICARE WELLNESS OBJECTIVES: Physical activity:   Cardiac risk factors:   Depression/mood screen:   Depression screen [facility] 04/08/2014  Decreased Interest 0  Down, Depressed, Hopeless 0  PHQ - 2 Score 0    ADLs:   Cognitive Testing  Alert? Yes  Normal Appearance?Yes  Oriented to person? Yes  Place? Yes   Time? Yes  Recall of three objects?  Yes  Can perform simple calculations? Yes  Displays appropriate judgment?Yes  Can read the correct time from a watch face?Yes  EOL planning:   Does have a living will and Healthcare POA.  NO adjustments necessary.  Requested copy for chart.    Objective:   Today's Vitals   05/29/16 0934  BP: (!) 158/96  Pulse: 98  Resp: 16  Temp: 98.2 F (36.8 C)  TempSrc: Temporal  Weight: 120 lb (54.4 kg)  Height: 5\' 2"  (1.575 m)   Body mass index is 21.95 kg/m.  General appearance: alert, no distress, WD/WN,  female HEENT: normocephalic, sclerae anicteric, TMs pearly, nares patent, no discharge or erythema, pharynx normal Oral cavity: MMM, no  lesions Neck: supple, no lymphadenopathy, no thyromegaly, no masses Heart: RRR, normal S1, S2, no murmurs Lungs: CTA bilaterally, no wheezes, rhonchi, or rales Abdomen: +bs, soft, non tender, non distended, no masses, no hepatomegaly, no splenomegaly Musculoskeletal: nontender, no swelling, no obvious deformity Extremities: no edema, no cyanosis, no clubbing Pulses: 2+ symmetric, upper and lower extremities, normal cap refill Neurological: alert, oriented x 3, CN2-12 intact, strength normal upper extremities and lower extremities, sensation normal throughout, DTRs 2+ throughout, no cerebellar signs, gait normal Psychiatric: normal affect, behavior normal, pleasant

## 2016-07-11 ENCOUNTER — Other Ambulatory Visit: Payer: Self-pay | Admitting: Physician Assistant

## 2016-07-11 ENCOUNTER — Other Ambulatory Visit: Payer: Self-pay | Admitting: Internal Medicine

## 2016-07-11 NOTE — Telephone Encounter (Signed)
Please call Lorazepam 

## 2016-07-19 ENCOUNTER — Encounter: Payer: Self-pay | Admitting: Internal Medicine

## 2016-07-24 DIAGNOSIS — H401431 Capsular glaucoma with pseudoexfoliation of lens, bilateral, mild stage: Secondary | ICD-10-CM | POA: Diagnosis not present

## 2016-10-19 ENCOUNTER — Encounter: Payer: Self-pay | Admitting: Internal Medicine

## 2016-10-19 NOTE — Patient Instructions (Signed)

## 2016-10-19 NOTE — Progress Notes (Signed)
[facility] Lucky Cowboy, M.D.    Dyanne Carrel. Steffanie Dunn, P.A.-C      Terri Piedra, P.A.-C  [facility]                [address]                [place], [place]. [address] Telephone [phone] Telefax [phone]   Comprehensive Evaluation &  Examination     This very nice 83 y.o. MWF presents for a comprehensive evaluation and management of multiple medical co-morbidities.  Patient has been followed for HTN, Prediabetes, Hyperlipidemia and Vitamin D Deficiency.      HTN predates since 72. Patient's BP has been controlled at home and patient denies any cardiac symptoms as chest pain, palpitations, shortness of breath, dizziness or ankle swelling. Patient also has CKD 2 consequent of her HTN. Today's BP is sl elevated at 148/80 and rechecked at 136/86.     Patient's hyperlipidemia is controlled with diet and supplements. Patient denies myalgias or other medication SE's. Last lipids were at goal albeit slightly elevated Trig's: Lab Results  Component Value Date   CHOL 189 12/12/2015   HDL 65 12/12/2015   LDLCALC 91 12/12/2015   TRIG 167 (H) 12/12/2015   CHOLHDL 2.9 12/12/2015      Patient has prediabetes (A1c 5.7% in 2012)  and patient denies reactive hypoglycemic symptoms, visual blurring, diabetic polys, or paresthesias. Last A1c was near goal: Lab Results  Component Value Date   HGBA1C 5.8 (H) 12/12/2015      Finally, patient has history of Vitamin D Deficiency ("25" in July 2016) and last Vitamin D was still very low as she does not supplement as repeatedly recommended: Lab Results  Component Value Date   VD25OH 34 (L) 06/07/2015   Current Outpatient Prescriptions on File Prior to Visit  Medication Sig  . ESTRACE 1 MG tablet TAKE ONE TAB ONCE DAILY  . FLAXSEED OIL 1000 MG  Take  daily.  Marland Kitchen LORazepam  1 MG  TAKE ONE TAB THREE x DAILY AS NEEDED  . Magnesium 400 MG Take 400 mg  daily.  . Multiple  Vitamin Take 1 tablet daily.  . Tumeric 450 mg daily.   . traMADol  50 MG t Take 0.5 tab every 6 hrs as needed.   Allergies  Allergen Reactions  . Adhesive [Tape]   . Advil [Ibuprofen]   . Atenolol    Past Medical History:  Diagnosis Date  . Chest pain   . DDD (degenerative disc disease)   . Essential hypertension, benign   . GERD (gastroesophageal reflux disease)   . Glaucoma   . Hyperlipidemia   . OA (osteoarthritis)   . Other left bundle branch block   . Other premature beats   . Pre-diabetes    Immunization History  Administered Date(s) Administered  . DTaP 05/06/2003  . Pneumococcal Polysaccharide-23 05/06/2003  . Zoster 07/16/2007   Past Surgical History:  Procedure Laterality Date  . GLAUCOMA SURGERY  2013  . TONSILLECTOMY     Family History  Problem Relation Age of Onset  . Heart attack Brother   . Heart disease Brother   . Heart attack Brother   . Heart disease Brother   . Hypertension Mother   . Stroke Mother   . Heart attack Father   . Diabetes Father    Social History  Substance Use Topics  . 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 Smokeless tobacco: Not on file  . Alcohol use No    ROS Constitutional: Denies fever, chills, weight loss/gain, headaches, insomnia,  night sweats, and change in appetite. Does c/o fatigue. Eyes: Denies redness, blurred vision, diplopia, discharge, itchy, watery eyes.  ENT: Denies discharge, congestion, post nasal drip, epistaxis, sore throat, earache, hearing loss, dental pain, Tinnitus, Vertigo, Sinus pain, snoring.  Cardio: Denies chest pain, palpitations, irregular heartbeat, syncope, dyspnea, diaphoresis, orthopnea, PND, claudication, edema Respiratory: denies cough, dyspnea, DOE, pleurisy, hoarseness, laryngitis, wheezing.  Gastrointestinal: Denies dysphagia, heartburn, reflux, water brash, pain, cramps, nausea, vomiting, bloating, diarrhea, constipation, hematemesis, melena, hematochezia,  jaundice, hemorrhoids Genitourinary: Denies dysuria, frequency, urgency, nocturia, hesitancy, discharge, hematuria, flank pain Breast: Breast lumps, nipple discharge, bleeding.  Musculoskeletal: Denies arthralgia, myalgia, stiffness, Jt. Swelling, pain, limp, and strain/sprain. Denies falls. Skin: Denies puritis, rash, hives, warts, acne, eczema, changing in skin lesion Neuro: No weakness, tremor, incoordination, spasms, paresthesia, pain Psychiatric: Denies confusion, memory loss, sensory loss. Denies Depression. Endocrine: Denies change in weight, skin, hair change, nocturia, and paresthesia, diabetic polys, visual blurring, hyper / hypo glycemic episodes.  Heme/Lymph: No excessive bleeding, bruising, enlarged lymph nodes.  Physical Exam  BP 136/86   P 76   T 97.8 F    Resp 16   Ht 5' 2.25"    Wt 117 lb 9.6 oz  BMI 21.34   General Appearance: Well nourished, well groomed and in no apparent distress.  Eyes: PERRLA, EOMs, conjunctiva no swelling or erythema, normal fundi and vessels. Sinuses: No frontal/maxillary tenderness ENT/Mouth: EACs patent / TMs  nl. Nares clear without erythema, swelling, mucoid exudates. Oral hygiene is good. No erythema, swelling, or exudate. Tongue normal, non-obstructing. Tonsils not swollen or erythematous. Hearing normal.  Neck: Supple, thyroid normal. No bruits, nodes or JVD. Respiratory: Respiratory effort normal.  BS equal and clear bilateral without rales, rhonci, wheezing or stridor. Cardio: Heart sounds are normal with regular rate and rhythm and no murmurs, rubs or gallops. Peripheral pulses are normal and equal bilaterally without edema. No aortic or femoral bruits. Chest: symmetric with normal excursions and percussion. Breasts: Symmetric, without lumps, nipple discharge, retractions, or fibrocystic changes.  Abdomen: Flat, soft with bowel sounds active. Nontender, no guarding, rebound, hernias, masses, or organomegaly.  Lymphatics: Non tender  without lymphadenopathy.  Genitourinary:  Musculoskeletal: Full ROM all peripheral extremities, joint stability, 5/5 strength, and normal gait. Skin: Warm and dry without rashes, lesions, cyanosis, clubbing or  ecchymosis.  Neuro: Cranial nerves intact, reflexes equal bilaterally. Normal muscle tone, no cerebellar symptoms. Sensation intact.  Pysch: Alert and oriented X 3, normal affect, Insight and Judgment appropriate.   Assessment and Plan  1. Essential hypertension  - EKG 12-Lead - Urinalysis, Routine w reflex microscopic - Microalbumin / creatinine urine ratio - CBC with Differential/Platelet - BASIC METABOLIC PANEL WITH GFR - Magnesium - TSH  2. Mixed hyperlipidemia  - EKG 12-Lead - Hepatic function panel - TSH  3. Prediabetes  - EKG 12-Lead - Hemoglobin A1c - Insulin, random  4. Vitamin D deficiency  - VITAMIN D 25 Hydroxy   5. Screening for rectal cancer  - POC Hemoccult Bld/Stl  6. Other abnormal glucose  7. Screening for ischemic heart disease  - EKG 12-Lead  8. Medication management  - Urinalysis, Routine w reflex microscopic - CBC with Differential/Platelet - BASIC METABOLIC PANEL WITH GFR - Hepatic function panel - Magnesium - TSH - Hemoglobin A1c - Insulin, random - VITAMIN D 25 Hydroxy  Patient was counseled in prudent diet to achieve/maintain BMI less than 25 for weight control, BP monitoring, regular exercise and medications. Discussed med's effects and SE's. Screening labs and tests as requested with regular follow-up as recommended. Over 40 minutes of exam, counseling, chart review and high complex critical decision making was performed.

## 2016-10-21 ENCOUNTER — Other Ambulatory Visit: Payer: Self-pay | Admitting: *Deleted

## 2016-10-21 ENCOUNTER — Ambulatory Visit (INDEPENDENT_AMBULATORY_CARE_PROVIDER_SITE_OTHER): Payer: Medicare Other | Admitting: Internal Medicine

## 2016-10-21 ENCOUNTER — Encounter: Payer: Self-pay | Admitting: Internal Medicine

## 2016-10-21 VITALS — BP 136/86 | HR 76 | Temp 97.8°F | Resp 16 | Ht 62.25 in | Wt 117.6 lb

## 2016-10-21 DIAGNOSIS — I10 Essential (primary) hypertension: Secondary | ICD-10-CM

## 2016-10-21 DIAGNOSIS — R7303 Prediabetes: Secondary | ICD-10-CM

## 2016-10-21 DIAGNOSIS — Z79899 Other long term (current) drug therapy: Secondary | ICD-10-CM | POA: Diagnosis not present

## 2016-10-21 DIAGNOSIS — E559 Vitamin D deficiency, unspecified: Secondary | ICD-10-CM

## 2016-10-21 DIAGNOSIS — E782 Mixed hyperlipidemia: Secondary | ICD-10-CM | POA: Diagnosis not present

## 2016-10-21 DIAGNOSIS — Z1212 Encounter for screening for malignant neoplasm of rectum: Secondary | ICD-10-CM

## 2016-10-21 DIAGNOSIS — Z136 Encounter for screening for cardiovascular disorders: Secondary | ICD-10-CM

## 2016-10-21 DIAGNOSIS — R7309 Other abnormal glucose: Secondary | ICD-10-CM

## 2016-10-21 LAB — BASIC METABOLIC PANEL WITH GFR
BUN: 13 mg/dL (ref 7–25)
CHLORIDE: 99 mmol/L (ref 98–110)
CO2: 27 mmol/L (ref 20–31)
CREATININE: 0.71 mg/dL (ref 0.60–0.88)
Calcium: 9.4 mg/dL (ref 8.6–10.4)
GFR, Est African American: 88 mL/min (ref >=60)
GFR, Est Non African American: 76 mL/min (ref >=60)
Glucose, Bld: 110 mg/dL — ABNORMAL HIGH (ref 65–99)
Potassium: 4.7 mmol/L (ref 3.5–5.3)
Sodium: 133 mmol/L — ABNORMAL LOW (ref 135–146)

## 2016-10-21 LAB — CBC WITH DIFFERENTIAL/PLATELET
BASOS ABS: 0 {cells}/uL (ref 0–200)
Basophils Relative: 0 %
EOS ABS: 56 {cells}/uL (ref 15–500)
Eosinophils Relative: 1 %
HCT: 43.2 % (ref 35.0–45.0)
HEMOGLOBIN: 14.7 g/dL (ref 11.7–15.5)
LYMPHS ABS: 1064 {cells}/uL (ref 850–3900)
Lymphocytes Relative: 19 %
MCH: 30.4 pg (ref 27.0–33.0)
MCHC: 34 g/dL (ref 32.0–36.0)
MCV: 89.4 fL (ref 80.0–100.0)
MONO ABS: 448 {cells}/uL (ref 200–950)
MPV: 9.4 fL (ref 7.5–12.5)
Monocytes Relative: 8 %
NEUTROS ABS: 4032 {cells}/uL (ref 1500–7800)
Neutrophils Relative %: 72 %
Platelets: 303 10*3/uL (ref 140–400)
RBC: 4.83 MIL/uL (ref 3.80–5.10)
RDW: 14.1 % (ref 11.0–15.0)
WBC: 5.6 10*3/uL (ref 3.8–10.8)

## 2016-10-21 LAB — HEPATIC FUNCTION PANEL
ALBUMIN: 4 g/dL (ref 3.6–5.1)
ALT: 14 U/L (ref 6–29)
AST: 15 U/L (ref 10–35)
Alkaline Phosphatase: 38 U/L (ref 33–130)
BILIRUBIN DIRECT: 0.2 mg/dL (ref <=0.2)
Indirect Bilirubin: 0.4 mg/dL (ref 0.2–1.2)
Total Bilirubin: 0.6 mg/dL (ref 0.2–1.2)
Total Protein: 6.4 g/dL (ref 6.1–8.1)

## 2016-10-21 LAB — TSH: TSH: 2.49 mIU/L

## 2016-10-21 MED ORDER — TRAMADOL HCL 50 MG PO TABS
ORAL_TABLET | ORAL | 0 refills | Status: DC
Start: 2016-10-21 — End: 2017-03-06

## 2016-10-22 LAB — URINALYSIS, ROUTINE W REFLEX MICROSCOPIC
Bilirubin Urine: NEGATIVE
Glucose, UA: NEGATIVE
Hgb urine dipstick: NEGATIVE
Ketones, ur: NEGATIVE
Leukocytes, UA: NEGATIVE
NITRITE: NEGATIVE
Protein, ur: NEGATIVE
SPECIFIC GRAVITY, URINE: 1.014 (ref 1.001–1.035)
pH: 7.5 (ref 5.0–8.0)

## 2016-10-22 LAB — HEMOGLOBIN A1C
Hgb A1c MFr Bld: 5.4 % (ref <5.7)
Mean Plasma Glucose: 108 mg/dL

## 2016-10-22 LAB — MICROALBUMIN / CREATININE URINE RATIO
CREATININE, URINE: 68 mg/dL (ref 20–320)
MICROALB UR: 2.1 mg/dL
Microalb Creat Ratio: 31 mcg/mg creat — ABNORMAL HIGH (ref <30)

## 2016-10-22 LAB — INSULIN, RANDOM: INSULIN: 17.9 u[IU]/mL (ref 2.0–19.6)

## 2016-10-22 LAB — VITAMIN D 25 HYDROXY (VIT D DEFICIENCY, FRACTURES): Vit D, 25-Hydroxy: 28 ng/mL — ABNORMAL LOW (ref 30–100)

## 2016-10-22 LAB — MAGNESIUM: MAGNESIUM: 2.2 mg/dL (ref 1.5–2.5)

## 2016-11-07 ENCOUNTER — Other Ambulatory Visit: Payer: Self-pay | Admitting: Internal Medicine

## 2017-01-22 NOTE — Progress Notes (Signed)
Assessment and Plan:   Hypertension -Continue medication, monitor blood pressure at home. Continue DASH diet.  Reminder to go to the ER if any CP, SOB, nausea, dizziness, severe HA, changes vision/speech, left arm numbness and tingling and jaw pain.  Cholesterol -Continue diet and exercise. Check cholesterol.    Prediabetes  -Continue diet and exercise. Check A1C  Vitamin D Def - check level and continue medications.   Estrogen def Taper off estrogen  Continue diet and meds as discussed. Further disposition pending results of labs. Over 30 minutes of exam, counseling, chart review, and critical decision making was performed  Future Appointments Date Time Provider Department Center  05/05/2017 10:30 AM Lucky CowboyMcKeown, William, MD [facility] None  11/13/2017 10:00 AM Lucky CowboyMcKeown, William, MD [facility] None   ONLY NEED TO COME EVERY 6 MONTHS  HPI 83 y.o. female  presents for 3 month follow up on hypertension, cholesterol, prediabetes, and vitamin D deficiency.   Her blood pressure has been controlled at home, today their BP is BP: 122/70  She does workout, walks 30 mins 5 days a week. She denies chest pain, shortness of breath, dizziness. She is [age] and still on estrace and is s/p hysterectomy, was on it due to dryness, will try to get off. She is not on a bASA  She is not on cholesterol medication and denies myalgias. Her cholesterol is at goal. The cholesterol last visit was:   Lab Results  Component Value Date   CHOL 189 12/12/2015   HDL 65 12/12/2015   LDLCALC 91 12/12/2015   TRIG 167 (H) 12/12/2015   CHOLHDL 2.9 12/12/2015    She has been working on diet and exercise for prediabetes, and denies paresthesia of the feet, polydipsia, polyuria and visual disturbances. Last A1C in the office was:  Lab Results  Component Value Date   HGBA1C 5.4 10/21/2016   Patient is on Vitamin D supplement, IS ON IT, UNKNOWN.   Lab Results  Component Value Date   VD25OH 28 (L) 10/21/2016       BMI is Body mass index is 21.12 kg/m., she is working on diet and exercise. Husband passed and she is not cooking as much or eating as well.  Wt Readings from Last 3 Encounters:  01/23/17 116 lb 6.4 oz (52.8 kg)  10/21/16 117 lb 9.6 oz (53.3 kg)  05/29/16 120 lb (54.4 kg)      Current Medications:  Current Outpatient Prescriptions on File Prior to Visit  Medication Sig  . estradiol (ESTRACE) 1 MG tablet TAKE ONE TABLET BY MOUTH ONCE DAILY  . Flaxseed, Linseed, (FLAXSEED OIL) 1000 MG CAPS Take 1,000 mg by mouth daily.  Marland Kitchen. latanoprost (XALATAN) 0.005 % ophthalmic solution   . LORazepam (ATIVAN) 1 MG tablet TAKE ONE TABLET BY MOUTH THREE TIMES DAILY AS NEEDED  . magnesium oxide (MAG-OX) 400 MG tablet Take 400 mg by mouth daily.  . Multiple Vitamin (MULTIVITAMIN) tablet Take 1 tablet by mouth daily.  Marland Kitchen. OVER THE COUNTER MEDICATION 450 mg daily. Tumeric  . traMADol (ULTRAM) 50 MG tablet Take 1/2 to 1 tablet every 6 hours as needed for pain.   No current facility-administered medications on file prior to visit.     Medical History: No past medical history on file. Allergies:  Allergies  Allergen Reactions  . Adhesive [Tape]   . Advil [Ibuprofen]   . Atenolol      Review of Systems:  Review of Systems  Constitutional: Negative.   HENT: Negative.  Eyes: Negative.   Respiratory: Negative.   Cardiovascular: Negative.   Gastrointestinal: Negative.   Genitourinary: Negative.   Musculoskeletal: Negative.   Skin: Negative.     Family history- Review and unchanged Social history- Review and unchanged Physical Exam: BP 122/70   Pulse 83   Temp 97.9 F (36.6 C)   Resp 14   Ht 5' 2.25" (1.581 m)   Wt 116 lb 6.4 oz (52.8 kg)   SpO2 95%   BMI 21.12 kg/m  Wt Readings from Last 3 Encounters:  01/23/17 116 lb 6.4 oz (52.8 kg)  10/21/16 117 lb 9.6 oz (53.3 kg)  05/29/16 120 lb (54.4 kg)   General Appearance: Well nourished, in no apparent distress. Eyes: PERRLA, EOMs,  conjunctiva no swelling or erythema Sinuses: No Frontal/maxillary tenderness ENT/Mouth: Ext aud canals clear, TMs without erythema, bulging. No erythema, swelling, or exudate on post pharynx.  Tonsils not swollen or erythematous. Hearing normal.  Neck: Supple, thyroid normal.  Respiratory: Respiratory effort normal, BS equal bilaterally without rales, rhonchi, wheezing or stridor.  Cardio: RRR with no MRGs. Brisk peripheral pulses without edema.  Abdomen: Soft, + BS,  Non tender, no guarding, rebound, hernias, masses. Lymphatics: Non tender without lymphadenopathy.  Musculoskeletal: Full ROM, 5/5 strength, Normal gait Skin: Warm, dry without rashes, lesions, ecchymosis.  Neuro: Cranial nerves intact. Normal muscle tone, no cerebellar symptoms. Psych: Awake and oriented X 3, normal affect, Insight and Judgment appropriate.    Quentin Mulling, PA-C 10:45 AM [facility]

## 2017-01-23 ENCOUNTER — Encounter: Payer: Self-pay | Admitting: Physician Assistant

## 2017-01-23 ENCOUNTER — Ambulatory Visit (INDEPENDENT_AMBULATORY_CARE_PROVIDER_SITE_OTHER): Payer: Medicare Other | Admitting: Physician Assistant

## 2017-01-23 VITALS — BP 122/70 | HR 83 | Temp 97.9°F | Resp 14 | Ht 62.25 in | Wt 116.4 lb

## 2017-01-23 DIAGNOSIS — R7303 Prediabetes: Secondary | ICD-10-CM | POA: Diagnosis not present

## 2017-01-23 DIAGNOSIS — E559 Vitamin D deficiency, unspecified: Secondary | ICD-10-CM

## 2017-01-23 DIAGNOSIS — I10 Essential (primary) hypertension: Secondary | ICD-10-CM | POA: Diagnosis not present

## 2017-01-23 DIAGNOSIS — E782 Mixed hyperlipidemia: Secondary | ICD-10-CM | POA: Diagnosis not present

## 2017-01-23 DIAGNOSIS — Z79899 Other long term (current) drug therapy: Secondary | ICD-10-CM

## 2017-01-23 DIAGNOSIS — I447 Left bundle-branch block, unspecified: Secondary | ICD-10-CM | POA: Diagnosis not present

## 2017-01-23 LAB — CBC WITH DIFFERENTIAL/PLATELET
BASOS PCT: 1 %
Basophils Absolute: 69 cells/uL (ref 0–200)
EOS ABS: 207 {cells}/uL (ref 15–500)
Eosinophils Relative: 3 %
HEMATOCRIT: 42.6 % (ref 35.0–45.0)
Hemoglobin: 14.5 g/dL (ref 11.7–15.5)
LYMPHS PCT: 14 %
Lymphs Abs: 966 cells/uL (ref 850–3900)
MCH: 30.9 pg (ref 27.0–33.0)
MCHC: 34 g/dL (ref 32.0–36.0)
MCV: 90.8 fL (ref 80.0–100.0)
MONOS PCT: 11 %
MPV: 8.8 fL (ref 7.5–12.5)
Monocytes Absolute: 759 cells/uL (ref 200–950)
NEUTROS PCT: 71 %
Neutro Abs: 4899 cells/uL (ref 1500–7800)
PLATELETS: 423 10*3/uL — AB (ref 140–400)
RBC: 4.69 MIL/uL (ref 3.80–5.10)
RDW: 13.3 % (ref 11.0–15.0)
WBC: 6.9 10*3/uL (ref 3.8–10.8)

## 2017-01-23 LAB — HEPATIC FUNCTION PANEL
ALBUMIN: 4 g/dL (ref 3.6–5.1)
ALK PHOS: 55 U/L (ref 33–130)
ALT: 11 U/L (ref 6–29)
AST: 18 U/L (ref 10–35)
BILIRUBIN TOTAL: 0.6 mg/dL (ref 0.2–1.2)
Bilirubin, Direct: 0.1 mg/dL (ref <=0.2)
Indirect Bilirubin: 0.5 mg/dL (ref 0.2–1.2)
TOTAL PROTEIN: 6.5 g/dL (ref 6.1–8.1)

## 2017-01-23 LAB — TSH: TSH: 2.22 mIU/L

## 2017-01-23 LAB — BASIC METABOLIC PANEL WITH GFR
BUN: 15 mg/dL (ref 7–25)
CALCIUM: 9.7 mg/dL (ref 8.6–10.4)
CO2: 26 mmol/L (ref 20–31)
Chloride: 96 mmol/L — ABNORMAL LOW (ref 98–110)
Creat: 0.71 mg/dL (ref 0.60–0.88)
GFR, EST AFRICAN AMERICAN: 88 mL/min (ref >=60)
GFR, Est Non African American: 76 mL/min (ref >=60)
GLUCOSE: 89 mg/dL (ref 65–99)
POTASSIUM: 4.6 mmol/L (ref 3.5–5.3)
Sodium: 132 mmol/L — ABNORMAL LOW (ref 135–146)

## 2017-01-23 NOTE — Patient Instructions (Signed)
Start taper off the the estrogen, do 1/2 pill daily for 2 weeks, then do 1/2 pill every other day for 2 weeks and then you can stop. Here is info on vaginal dryness below, we can treat you locally for the dryness or if you need to stay on the estrogen we will do 1/2 pill 3 days a week.   VAGINAL DRYNESS OVERVIEW  Vaginal dryness, also known as atrophic vaginitis, is a common condition in postmenopausal women. This condition is also common in women who have had both ovaries removed at the time of hysterectomy.   Some women have uncomfortable symptoms of vaginal dryness, such as pain with sex, burning vaginal discomfort or itching, or abnormal vaginal discharge, while others have no symptoms at all.  VAGINAL DRYNESS CAUSES   Estrogen helps to keep the vagina moist and to maintain thickness of the vaginal lining. Vaginal dryness occurs when the ovaries produce a decreased amount of estrogen. This can occur at certain times in a woman's life, and may be permanent or temporary. Times when less estrogen is made include: ?At the time of menopause. ?After surgical removal of the ovaries, chemotherapy, or radiation therapy of the pelvis for cancer. ?After having a baby, particularly in women who breastfeed. ?While using certain medications, such as danazol, medroxyprogesterone (brand names: Provera or DepoProvera), leuprolide (brand name: Lupron), or nafarelin. When these medications are stopped, estrogen production resumes.  Women who smoke cigarettes have been shown to have an increased risk of an earlier menopause transition as compared to non-smokers. Therefore, atrophic vaginitis symptoms may appear at a younger age in this population.  VAGINAL DRYNESS TREATMENT   There are three treatment options for women with vaginal dryness:  Vaginal lubricants and moisturizers - Vaginal lubricants and moisturizers can be purchased without a prescription. These products do not contain any hormones and have  virtually no side effects. - Albolene is found in the facial cleanser section at [facility], [facility], or [employer]. It is a large jar with a blue top. This is the best lubricant for women because it is hypoallergenic. -Natural lubricants, such as olive, avocado or peanut oil, are easily available products that may be used as a lubricant with sex.  -Vaginal moisturizes (eg, Replens, Moist Again, Vagisil, K-Y Silk-E, and Feminease) are formulated to allow water to be retained in the vaginal tissues. Moisturizers are applied into the vagina three times weekly to allow a continued moisturizing effect. These should not be used just before having sex, as they can be irritating.  Vaginal estrogen - Vaginal estrogen is the most effective treatment option for women with vaginal dryness. Vaginal estrogen must be prescribed by a healthcare provider. Very low doses of vaginal estrogen can be used when it is put into the vagina to treat vaginal dryness. A small amount of estrogen is absorbed into the bloodstream, but only about 100 times less than when using estrogen pills or tablets. As a result, there is a much lower risk of side effects, such as blood clots, breast cancer, and heart attack, compared with other estrogen-containing products (birth control pills, menopausal hormone therapy).  Add ENTERIC COATED low dose 81 mg Aspirin daily OR can do every other day if you have easy bruising to protect your heart and head. As well as to reduce risk of Colon Cancer by 20 %, Skin Cancer by 26 % , Melanoma by 46% and Pancreatic cancer by 60%

## 2017-01-27 NOTE — Progress Notes (Signed)
Pt aware of lab results & voiced understanding of those results.

## 2017-01-30 DIAGNOSIS — H401431 Capsular glaucoma with pseudoexfoliation of lens, bilateral, mild stage: Secondary | ICD-10-CM | POA: Diagnosis not present

## 2017-03-06 ENCOUNTER — Other Ambulatory Visit: Payer: Self-pay | Admitting: Internal Medicine

## 2017-03-06 NOTE — Telephone Encounter (Signed)
please call Tramadol 

## 2017-04-14 ENCOUNTER — Other Ambulatory Visit: Payer: Self-pay | Admitting: Internal Medicine

## 2017-04-14 NOTE — Telephone Encounter (Signed)
Ativan was called into pharmacy on Oct 1st 2018 by DD

## 2017-05-04 NOTE — Patient Instructions (Signed)

## 2017-05-04 NOTE — Progress Notes (Signed)
This very nice 83 y.o. MWF presents for 6 month follow up with Hypertension, Hyperlipidemia, Pre-Diabetes and Vitamin D Deficiency.      Patient is treated for HTN (1980) & BP has been controlled at home. Today's BP is at goal - 140/80. Patient also has HT CKD 2. Patient has had no complaints of any cardiac type chest pain, palpitations, dyspnea / orthopnea / PND, dizziness, claudication, or dependent edema.     Hyperlipidemia is controlled with diet & supplements. Last Lipids were at goal albeit slightly elevated Trig's: Lab Results  Component Value Date   CHOL 189 12/12/2015   HDL 65 12/12/2015   LDLCALC 91 12/12/2015   TRIG 167 (H) 12/12/2015   CHOLHDL 2.9 12/12/2015      Also, the patient has history of PreDiabetes since 2012 with A1c 5.7% and has had no symptoms of reactive hypoglycemia, diabetic polys, paresthesias or visual blurring.  Last A1c was normal and at goal:  Lab Results  Component Value Date   HGBA1C 5.4 10/21/2016      Further, the patient also has history of Vitamin D Deficiency of "25" in 2016 and does not supplement vitamin D as repeatedly advised. Last vitamin D was still very low: Lab Results  Component Value Date   VD25OH 28 (L) 10/21/2016   Current Outpatient Prescriptions on File Prior to Visit  Medication Sig  . Flaxseed, Linseed, (FLAXSEED OIL) 1000 MG CAPS Take 1,000 mg by mouth daily.  Marland Kitchen latanoprost (XALATAN) 0.005 % ophthalmic solution   . LORazepam (ATIVAN) 1 MG tablet TAKE ONE TABLET BY MOUTH THREE TIMES DAILY AS NEEDED  . magnesium oxide (MAG-OX) 400 MG tablet Take 400 mg by mouth daily.  . Multiple Vitamin (MULTIVITAMIN) tablet Take 1 tablet by mouth daily.  Marland Kitchen OVER THE COUNTER MEDICATION 450 mg daily. Tumeric  . traMADol (ULTRAM) 50 MG tablet TAKE 1/2 (ONE-HALF) TABLET BY MOUTH EVERY 6 HOURS AS NEEDED FOR PAIN   No current facility-administered medications on file prior to visit.    Allergies  Allergen Reactions  . Adhesive [Tape]   .  Advil [Ibuprofen]   . Atenolol    PMHx:  History reviewed. No pertinent past medical history. Immunization History  Administered Date(s) Administered  . DTaP 05/06/2003  . Pneumococcal Polysaccharide-23 05/06/2003  . Zoster 07/16/2007   Past Surgical History:  Procedure Laterality Date  . GLAUCOMA SURGERY  2013  . TONSILLECTOMY     FHx:    Reviewed / unchanged  SHx:    Reviewed / unchanged  Systems Review:  Constitutional: Denies fever, chills, wt changes, headaches, insomnia, fatigue, night sweats, change in appetite. Eyes: Denies redness, blurred vision, diplopia, discharge, itchy, watery eyes.  ENT: Denies discharge, congestion, post nasal drip, epistaxis, sore throat, earache, hearing loss, dental pain, tinnitus, vertigo, sinus pain, snoring.  CV: Denies chest pain, palpitations, irregular heartbeat, syncope, dyspnea, diaphoresis, orthopnea, PND, claudication or edema. Respiratory: denies cough, dyspnea, DOE, pleurisy, hoarseness, laryngitis, wheezing.  Gastrointestinal: Denies dysphagia, odynophagia, heartburn, reflux, water brash, abdominal pain or cramps, nausea, vomiting, bloating, diarrhea, constipation, hematemesis, melena, hematochezia  or hemorrhoids. Genitourinary: Denies dysuria, frequency, urgency, nocturia, hesitancy, discharge, hematuria or flank pain. Musculoskeletal: Denies arthralgias, myalgias, stiffness, jt. swelling, pain, limping or strain/sprain.  Skin: Denies pruritus, rash, hives, warts, acne, eczema or change in skin lesion(s). Neuro: No weakness, tremor, incoordination, spasms, paresthesia or pain. Psychiatric: Denies confusion, memory loss or sensory loss. Endo: Denies change in weight, skin or hair change.  Heme/Lymph:  No excessive bleeding, bruising or enlarged lymph nodes.  Physical Exam  BP 140/80   Pulse 80   Temp (!) 97.3 F (36.3 C)   Resp 16   Ht 5' 2.25" (1.581 m)   Wt 114 lb 12.8 oz (52.1 kg)   BMI 20.83 kg/m   Appears well  nourished, well groomed  and in no distress.  Eyes: PERRLA, EOMs, conjunctiva no swelling or erythema. Sinuses: No frontal/maxillary tenderness ENT/Mouth: EAC's clear, TM's nl w/o erythema, bulging. Nares clear w/o erythema, swelling, exudates. Oropharynx clear without erythema or exudates. Oral hygiene is good. Tongue normal, non obstructing. Hearing intact.  Neck: Supple. Thyroid nl. Car 2+/2+ without bruits, nodes or JVD. Chest: Respirations nl with BS clear & equal w/o rales, rhonchi, wheezing or stridor.  Cor: Heart sounds normal w/ regular rate and rhythm without sig. murmurs, gallops, clicks or rubs. Peripheral pulses normal and equal  without edema.  Abdomen: Soft & bowel sounds normal. Non-tender w/o guarding, rebound, hernias, masses or organomegaly.  Lymphatics: Unremarkable.  Musculoskeletal: Full ROM all peripheral extremities, joint stability, 5/5 strength and normal gait.  Skin: Warm, dry without exposed rashes, lesions or ecchymosis apparent.  Neuro: Cranial nerves intact, reflexes equal bilaterally. Sensory-motor testing grossly intact. Tendon reflexes grossly intact.  Pysch: Alert & oriented x 3.  Insight and judgement nl & appropriate. No ideations.  Assessment and Plan:  1. Essential hypertension  - Continue medication, monitor blood pressure at home.  - Continue DASH diet. Reminder to go to the ER if any CP,  SOB, nausea, dizziness, severe HA, changes vision/speech.  - CBC with Differential/Platelet - BASIC METABOLIC PANEL WITH GFR - Magnesium - TSH  2. Hyperlipidemia, mixed  - Continue diet/meds, exercise,& lifestyle modifications.  - Continue monitor periodic cholesterol/liver & renal functions  - Hepatic function panel - Lipid panel - TSH  3. Prediabetes  - Continue diet, exercise, lifestyle modifications.  - Monitor appropriate labs.  - Hemoglobin A1c - Insulin, random  4. Vitamin D deficiency  - Continue supplementation.  - VITAMIN D 25  Hydroxy  5. Medication management  - CBC with Differential/Platelet - BASIC METABOLIC PANEL WITH GFR - Hepatic function panel - Magnesium - Lipid panel - TSH - Hemoglobin A1c - Insulin, random - VITAMIN D 25 Hydroxy         Discussed  regular exercise, BP monitoring, weight control to achieve/maintain BMI less than 25 and discussed med and SE's. Recommended labs to assess and monitor clinical status with further disposition pending results of labs. Over 30 minutes of exam, counseling, chart review was performed.

## 2017-05-05 ENCOUNTER — Ambulatory Visit (INDEPENDENT_AMBULATORY_CARE_PROVIDER_SITE_OTHER): Payer: Medicare Other | Admitting: Internal Medicine

## 2017-05-05 ENCOUNTER — Encounter: Payer: Self-pay | Admitting: Internal Medicine

## 2017-05-05 VITALS — BP 140/80 | HR 80 | Temp 97.3°F | Resp 16 | Ht 62.25 in | Wt 114.8 lb

## 2017-05-05 DIAGNOSIS — E559 Vitamin D deficiency, unspecified: Secondary | ICD-10-CM

## 2017-05-05 DIAGNOSIS — R7303 Prediabetes: Secondary | ICD-10-CM

## 2017-05-05 DIAGNOSIS — Z79899 Other long term (current) drug therapy: Secondary | ICD-10-CM | POA: Diagnosis not present

## 2017-05-05 DIAGNOSIS — I10 Essential (primary) hypertension: Secondary | ICD-10-CM

## 2017-05-05 DIAGNOSIS — E782 Mixed hyperlipidemia: Secondary | ICD-10-CM

## 2017-05-06 LAB — HEMOGLOBIN A1C
EAG (MMOL/L): 6 (calc)
Hgb A1c MFr Bld: 5.4 % of total Hgb (ref <5.7)
Mean Plasma Glucose: 108 (calc)

## 2017-05-06 LAB — CBC WITH DIFFERENTIAL/PLATELET
BASOS ABS: 61 {cells}/uL (ref 0–200)
Basophils Relative: 0.6 %
EOS ABS: 71 {cells}/uL (ref 15–500)
EOS PCT: 0.7 %
HEMATOCRIT: 43.6 % (ref 35.0–45.0)
HEMOGLOBIN: 15.1 g/dL (ref 11.7–15.5)
LYMPHS ABS: 1061 {cells}/uL (ref 850–3900)
MCH: 30.6 pg (ref 27.0–33.0)
MCHC: 34.6 g/dL (ref 32.0–36.0)
MCV: 88.4 fL (ref 80.0–100.0)
MPV: 9.8 fL (ref 7.5–12.5)
Monocytes Relative: 7.9 %
NEUTROS ABS: 8201 {cells}/uL — AB (ref 1500–7800)
Neutrophils Relative %: 80.4 %
Platelets: 376 10*3/uL (ref 140–400)
RBC: 4.93 10*6/uL (ref 3.80–5.10)
RDW: 13.3 % (ref 11.0–15.0)
Total Lymphocyte: 10.4 %
WBC: 10.2 10*3/uL (ref 3.8–10.8)
WBCMIX: 806 {cells}/uL (ref 200–950)

## 2017-05-06 LAB — BASIC METABOLIC PANEL WITH GFR
BUN: 15 mg/dL (ref 7–25)
CO2: 30 mmol/L (ref 20–32)
CREATININE: 0.75 mg/dL (ref 0.60–0.88)
Calcium: 10.5 mg/dL — ABNORMAL HIGH (ref 8.6–10.4)
Chloride: 98 mmol/L (ref 98–110)
GFR, EST NON AFRICAN AMERICAN: 71 mL/min/{1.73_m2} (ref >=60)
GFR, Est African American: 82 mL/min/{1.73_m2} (ref >=60)
Glucose, Bld: 95 mg/dL (ref 65–99)
Potassium: 5.1 mmol/L (ref 3.5–5.3)
SODIUM: 134 mmol/L — AB (ref 135–146)

## 2017-05-06 LAB — HEPATIC FUNCTION PANEL
AG RATIO: 1.9 (calc) (ref 1.0–2.5)
ALKALINE PHOSPHATASE (APISO): 45 U/L (ref 33–130)
ALT: 12 U/L (ref 6–29)
AST: 15 U/L (ref 10–35)
Albumin: 4.7 g/dL (ref 3.6–5.1)
BILIRUBIN TOTAL: 0.7 mg/dL (ref 0.2–1.2)
Bilirubin, Direct: 0.1 mg/dL (ref 0.0–0.2)
Globulin: 2.5 g/dL (calc) (ref 1.9–3.7)
Indirect Bilirubin: 0.6 mg/dL (calc) (ref 0.2–1.2)
Total Protein: 7.2 g/dL (ref 6.1–8.1)

## 2017-05-06 LAB — MAGNESIUM: Magnesium: 2.4 mg/dL (ref 1.5–2.5)

## 2017-05-06 LAB — VITAMIN D 25 HYDROXY (VIT D DEFICIENCY, FRACTURES): VIT D 25 HYDROXY: 31 ng/mL (ref 30–100)

## 2017-05-06 LAB — LIPID PANEL
CHOL/HDL RATIO: 2.8 (calc) (ref <5.0)
Cholesterol: 196 mg/dL (ref <200)
HDL: 69 mg/dL (ref >50)
LDL CHOLESTEROL (CALC): 104 mg/dL — AB
NON-HDL CHOLESTEROL (CALC): 127 mg/dL (ref <130)
TRIGLYCERIDES: 132 mg/dL (ref <150)

## 2017-05-06 LAB — TSH: TSH: 2.52 mIU/L (ref 0.40–4.50)

## 2017-05-06 LAB — INSULIN, RANDOM: INSULIN: 5.8 u[IU]/mL (ref 2.0–19.6)

## 2017-07-31 DIAGNOSIS — H401431 Capsular glaucoma with pseudoexfoliation of lens, bilateral, mild stage: Secondary | ICD-10-CM | POA: Diagnosis not present

## 2017-08-14 ENCOUNTER — Other Ambulatory Visit: Payer: Self-pay | Admitting: Internal Medicine

## 2017-08-26 DIAGNOSIS — K5904 Chronic idiopathic constipation: Secondary | ICD-10-CM | POA: Diagnosis not present

## 2017-10-06 ENCOUNTER — Other Ambulatory Visit: Payer: Self-pay | Admitting: Internal Medicine

## 2017-10-09 DIAGNOSIS — H0014 Chalazion left upper eyelid: Secondary | ICD-10-CM | POA: Diagnosis not present

## 2017-10-16 DIAGNOSIS — H0014 Chalazion left upper eyelid: Secondary | ICD-10-CM | POA: Diagnosis not present

## 2017-11-13 ENCOUNTER — Encounter: Payer: Self-pay | Admitting: Internal Medicine

## 2017-11-13 ENCOUNTER — Ambulatory Visit (INDEPENDENT_AMBULATORY_CARE_PROVIDER_SITE_OTHER): Payer: Medicare Other | Admitting: Internal Medicine

## 2017-11-13 VITALS — BP 138/86 | HR 103 | Temp 97.7°F | Ht 62.0 in | Wt 119.0 lb

## 2017-11-13 DIAGNOSIS — Z87891 Personal history of nicotine dependence: Secondary | ICD-10-CM | POA: Diagnosis not present

## 2017-11-13 DIAGNOSIS — I10 Essential (primary) hypertension: Secondary | ICD-10-CM | POA: Diagnosis not present

## 2017-11-13 DIAGNOSIS — E559 Vitamin D deficiency, unspecified: Secondary | ICD-10-CM

## 2017-11-13 DIAGNOSIS — Z1212 Encounter for screening for malignant neoplasm of rectum: Secondary | ICD-10-CM

## 2017-11-13 DIAGNOSIS — Z79899 Other long term (current) drug therapy: Secondary | ICD-10-CM | POA: Diagnosis not present

## 2017-11-13 DIAGNOSIS — E782 Mixed hyperlipidemia: Secondary | ICD-10-CM

## 2017-11-13 DIAGNOSIS — Z1211 Encounter for screening for malignant neoplasm of colon: Secondary | ICD-10-CM

## 2017-11-13 DIAGNOSIS — R7303 Prediabetes: Secondary | ICD-10-CM | POA: Diagnosis not present

## 2017-11-13 DIAGNOSIS — R7309 Other abnormal glucose: Secondary | ICD-10-CM | POA: Diagnosis not present

## 2017-11-13 DIAGNOSIS — Z136 Encounter for screening for cardiovascular disorders: Secondary | ICD-10-CM | POA: Diagnosis not present

## 2017-11-13 DIAGNOSIS — Z8249 Family history of ischemic heart disease and other diseases of the circulatory system: Secondary | ICD-10-CM

## 2017-11-13 LAB — LIPID PANEL
Cholesterol: 181 mg/dL (ref <200)
HDL: 58 mg/dL (ref >50)
LDL Cholesterol (Calc): 102 mg/dL (calc) — ABNORMAL HIGH
Non-HDL Cholesterol (Calc): 123 mg/dL (calc) (ref <130)
TRIGLYCERIDES: 114 mg/dL (ref <150)
Total CHOL/HDL Ratio: 3.1 (calc) (ref <5.0)

## 2017-11-13 LAB — CBC WITH DIFFERENTIAL/PLATELET
BASOS PCT: 0.6 %
Basophils Absolute: 43 cells/uL (ref 0–200)
EOS ABS: 72 {cells}/uL (ref 15–500)
Eosinophils Relative: 1 %
HEMATOCRIT: 41.5 % (ref 35.0–45.0)
Hemoglobin: 14.2 g/dL (ref 11.7–15.5)
LYMPHS ABS: 763 {cells}/uL — AB (ref 850–3900)
MCH: 30 pg (ref 27.0–33.0)
MCHC: 34.2 g/dL (ref 32.0–36.0)
MCV: 87.7 fL (ref 80.0–100.0)
MPV: 9.7 fL (ref 7.5–12.5)
Monocytes Relative: 9 %
NEUTROS PCT: 78.8 %
Neutro Abs: 5674 cells/uL (ref 1500–7800)
PLATELETS: 353 10*3/uL (ref 140–400)
RBC: 4.73 10*6/uL (ref 3.80–5.10)
RDW: 13.2 % (ref 11.0–15.0)
TOTAL LYMPHOCYTE: 10.6 %
WBC: 7.2 10*3/uL (ref 3.8–10.8)
WBCMIX: 648 {cells}/uL (ref 200–950)

## 2017-11-13 LAB — COMPLETE METABOLIC PANEL WITH GFR
AG Ratio: 2 (calc) (ref 1.0–2.5)
ALKALINE PHOSPHATASE (APISO): 46 U/L (ref 33–130)
ALT: 11 U/L (ref 6–29)
AST: 14 U/L (ref 10–35)
Albumin: 4.3 g/dL (ref 3.6–5.1)
BILIRUBIN TOTAL: 0.7 mg/dL (ref 0.2–1.2)
BUN: 19 mg/dL (ref 7–25)
CHLORIDE: 102 mmol/L (ref 98–110)
CO2: 28 mmol/L (ref 20–32)
Calcium: 9.8 mg/dL (ref 8.6–10.4)
Creat: 0.84 mg/dL (ref 0.60–0.88)
GFR, Est African American: 71 mL/min/{1.73_m2} (ref >=60)
GFR, Est Non African American: 62 mL/min/{1.73_m2} (ref >=60)
GLUCOSE: 114 mg/dL — AB (ref 65–99)
Globulin: 2.2 g/dL (calc) (ref 1.9–3.7)
Potassium: 4.8 mmol/L (ref 3.5–5.3)
Sodium: 137 mmol/L (ref 135–146)
Total Protein: 6.5 g/dL (ref 6.1–8.1)

## 2017-11-13 LAB — TSH: TSH: 2.2 mIU/L (ref 0.40–4.50)

## 2017-11-13 LAB — MAGNESIUM: MAGNESIUM: 2.2 mg/dL (ref 1.5–2.5)

## 2017-11-13 NOTE — Patient Instructions (Signed)

## 2017-11-13 NOTE — Progress Notes (Signed)
[facility] Lucky Cowboy, M.D.     Dyanne Carrel. Steffanie Dunn, P.A.-C Judd Gaudier, DNP [facility] [address] [place], [place]. [address] Telephone [phone] Telefax [phone]  Comprehensive Evaluation &  Examination     This very nice 84 y.o.  MWF presents for a  comprehensive evaluation and management of multiple medical co-morbidities.  Patient has been followed for HTN, HLD, Prediabetes  and Vitamin D Deficiency.      HTN predates circa 1980. Patient also has consequent CKD2. Patient's BP has been controlled at home and patient denies any cardiac symptoms as chest pain, palpitations, shortness of breath, dizziness or ankle swelling. Today's BP is at goal - 138/86.      Patient's hyperlipidemia is controlled with diet and medications. Patient denies myalgias or other medication SE's. Last lipids were near goal with borderline elevated LDL: Lab Results  Component Value Date   CHOL 196 05/05/2017   HDL 69 05/05/2017   LDLCALC 104 (H) 05/05/2017   TRIG 132 05/05/2017   CHOLHDL 2.8 05/05/2017      Patient has prediabetes (A1c 5.7%/2012) and patient denies reactive hypoglycemic symptoms, visual blurring, diabetic polys, or paresthesias. Last A1c was Normal & at goal: Lab Results  Component Value Date   HGBA1C 5.4 05/05/2017      Finally, patient has history of Vitamin D Deficiency ("25"/2016) and last Vitamin D was still very low: Lab Results  Component Value Date   VD25OH 31 05/05/2017   Current Outpatient Medications on File Prior to Visit  Medication Sig  . Flaxseed, Linseed, (FLAXSEED OIL) 1000 MG CAPS Take 1,000 mg by mouth daily.  Marland Kitchen latanoprost (XALATAN) 0.005 % ophthalmic solution   . LORazepam (ATIVAN) 1 MG tablet TAKE ONE TABLET BY MOUTH THREE TIMES DAILY AS NEEDED  . magnesium oxide (MAG-OX) 400 MG tablet Take 400 mg by mouth daily.  . Multiple Vitamin (MULTIVITAMIN) tablet Take 1 tablet by mouth  daily.  Marland Kitchen OVER THE COUNTER MEDICATION 450 mg daily. Tumeric  . traMADol (ULTRAM) 50 MG tablet TAKE 1/2 TO 1 (ONE-HALF TO ONE) TABLET BY MOUTH TWICE DAILY TO 4 TIMES DAILY AS NEEDED FOR PAIN   No current facility-administered medications on file prior to visit.    Allergies  Allergen Reactions  . Adhesive [Tape]   . Advil [Ibuprofen]   . Atenolol    Health Maintenance  Topic Date Due  . DEXA SCAN  07/16/1993  . INFLUENZA VACCINE  Discontinued  . TETANUS/TDAP  Discontinued  . PNA vac Low Risk Adult  Discontinued   Immunization History  Administered Date(s) Administered  . DTaP 05/06/2003  . Pneumococcal Polysaccharide-23 05/06/2003  . Zoster 07/16/2007   Last Colon - patient refuses f/u Last MGM - 11/2010 - patient refuses f/u  Past Surgical History:  Procedure Laterality Date  . GLAUCOMA SURGERY  2013  . TONSILLECTOMY     Family History  Problem Relation Age of Onset  . Heart attack Brother   . Heart disease Brother   . Heart attack Brother   . Heart disease Brother   . Hypertension Mother   . Stroke Mother   . Heart attack Father   . Diabetes Father    Social History   Tobacco Use  . Smoking status: Former Smoker    Last attempt to quit: 07/16/1979    Years since quitting: 38.3  . Smokeless tobacco: Never Used  Substance Use Topics  . Alcohol use: No  Alcohol/week: 0.0 oz  . Drug use: No    ROS Constitutional: Denies fever, chills, weight loss/gain, headaches, insomnia,  night sweats, and change in appetite. Does c/o fatigue. Eyes: Denies redness, blurred vision, diplopia, discharge, itchy, watery eyes.  ENT: Denies discharge, congestion, post nasal drip, epistaxis, sore throat, earache, hearing loss, dental pain, Tinnitus, Vertigo, Sinus pain, snoring.  Cardio: Denies chest pain, palpitations, irregular heartbeat, syncope, dyspnea, diaphoresis, orthopnea, PND, claudication, edema Respiratory: denies cough, dyspnea, DOE, pleurisy, hoarseness, laryngitis,  wheezing.  Gastrointestinal: Denies dysphagia, heartburn, reflux, water brash, pain, cramps, nausea, vomiting, bloating, diarrhea, constipation, hematemesis, melena, hematochezia, jaundice, hemorrhoids Genitourinary: Denies dysuria, frequency, urgency, nocturia, hesitancy, discharge, hematuria, flank pain Breast: Breast lumps, nipple discharge, bleeding.  Musculoskeletal: Denies arthralgia, myalgia, stiffness, Jt. Swelling, pain, limp, and strain/sprain. Denies falls. Skin: Denies puritis, rash, hives, warts, acne, eczema, changing in skin lesion Neuro: No weakness, tremor, incoordination, spasms, paresthesia, pain Psychiatric: Denies confusion, memory loss, sensory loss. Denies Depression. Endocrine: Denies change in weight, skin, hair change, nocturia, and paresthesia, diabetic polys, visual blurring, hyper / hypo glycemic episodes.  Heme/Lymph: No excessive bleeding, bruising, enlarged lymph nodes.  Physical Exam  BP 138/86   Pulse (!) 103   Temp 97.7 F (36.5 C)   Ht  (1.575 m)   Wt 119 lb (54 kg)   SpO2 98%   BMI 21.77 kg/m   General Appearance: Well nourished, well groomed and in no apparent distress.  Eyes: PERRLA, EOMs, conjunctiva no swelling or erythema, normal fundi and vessels. Sinuses: No frontal/maxillary tenderness ENT/Mouth: EACs patent / TMs  nl. Nares clear without erythema, swelling, mucoid exudates. Oral hygiene is good. No erythema, swelling, or exudate. Tongue normal, non-obstructing. Tonsils not swollen or erythematous. Hearing normal.  Neck: Supple, thyroid not palpable. No bruits, nodes or JVD. Respiratory: Respiratory effort normal.  BS equal and clear bilateral without rales, rhonci, wheezing or stridor. Cardio: Heart sounds are normal with regular rate and rhythm and no murmurs, rubs or gallops. Peripheral pulses are normal and equal bilaterally without edema. No aortic or femoral bruits. Chest: symmetric with normal excursions and percussion. Breasts:  Symmetric, without lumps, nipple discharge, retractions, or fibrocystic changes.  Abdomen: Flat, soft with bowel sounds active. Nontender, no guarding, rebound, hernias, masses, or organomegaly.  Lymphatics: Non tender without lymphadenopathy.  Genitourinary:  Musculoskeletal: Full ROM all peripheral extremities, joint stability, 5/5 strength, and normal gait. Skin: Warm and dry without rashes, lesions, cyanosis, clubbing or  ecchymosis.  Neuro: Cranial nerves intact, reflexes equal bilaterally. Normal muscle tone, no cerebellar symptoms. Sensation intact.  Pysch: Alert and oriented X 3, normal affect, Insight and Judgment appropriate.   Assessment and Plan  1. Essential hypertension  - EKG 12-Lead - Urinalysis, Routine w reflex microscopic - Microalbumin / creatinine urine ratio - CBC with Differential/Platelet - COMPLETE METABOLIC PANEL WITH GFR - Magnesium - TSH  2. Hyperlipidemia, mixed  - EKG 12-Lead - Lipid panel - TSH  3. Abnormal glucose  - EKG 12-Lead - Hemoglobin A1c - Insulin, random  4. Vitamin D deficiency  - VITAMIN D 25 Hydroxyl  5. Prediabetes  - EKG 12-Lead - Hemoglobin A1c - Insulin, random  6. Screening for colorectal cancer  - POC Hemoccult Bld/Stl  7. Screening for ischemic heart disease  - EKG 12-Lead  8. Former smoker  - EKG 12-Lead  9. FHx: heart disease  - EKG 12-Lead  10. Medication management  - Urinalysis, Routine w reflex microscopic - Microalbumin / creatinine urine ratio -  CBC with Differential/Platelet - COMPLETE METABOLIC PANEL WITH GFR - Magnesium - Lipid panel - TSH - Hemoglobin A1c - Insulin, random - VITAMIN D 25 Hydroxyl              Patient was counseled in prudent diet to achieve/maintain BMI less than 25 for weight control, BP monitoring, regular exercise and medications. Discussed med's effects and SE's. Screening labs and tests as requested with regular follow-up as recommended. Over 40 minutes of exam,  counseling, chart review and high complex critical decision making was performed.

## 2017-11-14 LAB — VITAMIN D 25 HYDROXY (VIT D DEFICIENCY, FRACTURES): VIT D 25 HYDROXY: 28 ng/mL — AB (ref 30–100)

## 2017-11-14 LAB — MICROALBUMIN / CREATININE URINE RATIO
CREATININE, URINE: 70 mg/dL (ref 20–275)
Microalb Creat Ratio: 26 mcg/mg creat (ref <30)
Microalb, Ur: 1.8 mg/dL

## 2017-11-14 LAB — URINALYSIS, ROUTINE W REFLEX MICROSCOPIC
BACTERIA UA: NONE SEEN /HPF
BILIRUBIN URINE: NEGATIVE
Glucose, UA: NEGATIVE
HGB URINE DIPSTICK: NEGATIVE
Hyaline Cast: NONE SEEN /LPF
KETONES UR: NEGATIVE
Nitrite: NEGATIVE
Protein, ur: NEGATIVE
RBC / HPF: NONE SEEN /HPF (ref 0–2)
SPECIFIC GRAVITY, URINE: 1.016 (ref 1.001–1.03)
pH: 7.5 (ref 5.0–8.0)

## 2017-11-14 LAB — HEMOGLOBIN A1C
HEMOGLOBIN A1C: 5.7 %{Hb} — AB (ref <5.7)
MEAN PLASMA GLUCOSE: 117 (calc)
eAG (mmol/L): 6.5 (calc)

## 2017-11-14 LAB — INSULIN, RANDOM: Insulin: 17.3 u[IU]/mL (ref 2.0–19.6)

## 2017-11-15 ENCOUNTER — Encounter: Payer: Self-pay | Admitting: Internal Medicine

## 2017-11-25 ENCOUNTER — Other Ambulatory Visit: Payer: Self-pay | Admitting: Internal Medicine

## 2018-01-12 ENCOUNTER — Other Ambulatory Visit: Payer: Self-pay | Admitting: Physician Assistant

## 2018-01-20 ENCOUNTER — Other Ambulatory Visit: Payer: Self-pay | Admitting: Physician Assistant

## 2018-01-26 ENCOUNTER — Telehealth: Payer: Self-pay | Admitting: *Deleted

## 2018-01-26 MED ORDER — GABAPENTIN 100 MG PO CAPS: 100.0000 mg | ORAL_CAPSULE | Freq: Three times a day (TID) | ORAL | 1 refills | Status: DC

## 2018-01-26 NOTE — Telephone Encounter (Signed)
Patient was advised, per Dr Oneta RackMcKeown, that he can not refill her Tramadol because it is a controlled substance. A new RX for Gabapentin 100 mg 1 tid sent to the patient's pharmacy, per Dr Oneta RackMcKeown.

## 2018-01-29 DIAGNOSIS — H401431 Capsular glaucoma with pseudoexfoliation of lens, bilateral, mild stage: Secondary | ICD-10-CM | POA: Diagnosis not present

## 2018-03-04 DIAGNOSIS — F419 Anxiety disorder, unspecified: Secondary | ICD-10-CM | POA: Insufficient documentation

## 2018-03-04 NOTE — Progress Notes (Signed)
MEDICARE ANNUAL WELLNESS VISIT AND FOLLOW UP  Assessment:    Diagnoses and all orders for this visit:  Encounter for Medicare annual wellness exam  LBBB (left bundle branch block) Avoid rate controlling drugs, monitor, annual EKG  Essential hypertension Currently off of medications and at goal Monitor blood pressure at home; call if consistently over 130/80 Continue DASH diet.   Reminder to go to the ER if any CP, SOB, nausea, dizziness, severe HA, changes vision/speech, left arm numbness and tingling and jaw pain.  Vitamin D deficiency Newly on supplementation; unsure of dose Check vitamin D level  Prediabetes Discussed disease and risks Discussed diet/exercise, weight management  A1C  Medication management CBC, CMP/GFR  Hyperlipidemia No longer treated secondary to age; check annually at CPE Continue lifestyle  Anxiety Takes ativan at night mainly for sleep; anxiety recently well controlled Continue lifestyle efforts  BMI 21.0-21.9, adult Continue to recommend diet heavy in fruits and veggies and low in animal meats, cheeses, and dairy products, appropriate calorie intake Discuss exercise recommendations routinely Continue to monitor weight at each visit  High risk medication use Patient is on opioids as well as benzos, due to new guidelines we discussed d/cing opioid use.  She is agreeable to trying tylenol and newly prescribed gabapentin, discussed SE and potential for some drowsiness with this medication, start with low dose. Call back with any questions or concerns.   Over 40 minutes of exam, counseling, chart review and critical decision making was performed Future Appointments  Date Time Provider Department Center  06/16/2018 10:30 AM Lucky CowboyMcKeown, William, MD [facility] None  12/14/2018 10:00 AM Lucky CowboyMcKeown, William, MD [facility] None     Plan:   During the course of the visit the patient was educated and counseled about appropriate screening and preventive  services including:    Pneumococcal vaccine   Prevnar 13  Influenza vaccine  Td vaccine  Screening electrocardiogram  Bone densitometry screening  Colorectal cancer screening  Diabetes screening  Glaucoma screening  Nutrition counseling   Advanced directives: requested   Subjective:  Leslie [name] is a 84 y.o. female who presents for Medicare Annual Wellness Visit and 3 month follow up.   She is currently prescribed ativan 1 mg for anxiety; she reports she typically takes at night for sleep.   She has also been prescribed tramadol for aches and pains in the past; she reports she typically takes ~2 tabs/week. Has been taking this for nearly 10 years. Not currently taking tylenol or other OTC agents.  Due to new guidelines we discussed d/c'ing opioid. Went into great detail and length that new studies show that unintential overdose was most likely to occur with concurrent benzo use, that opioids are better acute and short term pain management and long term recent studies show that patients on long term opioid use have worse outcomes and more complications than other medications. She has been prescribed gabapentin in lieu of tramadol to try for pain but has not started this, but after our discussion is willing to try this and tylenol.   BMI is Body mass index is 21.77 kg/m., she has been working on diet and exercise. Wt Readings from Last 3 Encounters:  03/09/18 119 lb (54 kg)  11/13/17 119 lb (54 kg)  05/05/17 114 lb 12.8 oz (52.1 kg)    Her blood pressure has been controlled at home (135/85 this AM, this is typical, systolic ranges 120-150), today their BP is BP: (!) 142/82 She does workout. She denies chest pain,  shortness of breath, dizziness.   She is not on cholesterol medication and denies myalgias. Her cholesterol is at goal. The cholesterol last visit was:   Lab Results  Component Value Date   CHOL 181 11/13/2017   HDL 58 11/13/2017   LDLCALC 102 (H) 11/13/2017    TRIG 114 11/13/2017   CHOLHDL 3.1 11/13/2017    She has been working on diet and exercise for prediabetes (rarely eats bread, does have a sweet tooth), and denies foot ulcerations, increased appetite, nausea, paresthesia of the feet, polydipsia, polyuria, visual disturbances, vomiting and weight loss. Last A1C in the office was:  Lab Results  Component Value Date   HGBA1C 5.7 (H) 11/13/2017   Last GFR: Lab Results  Component Value Date   GFRNONAA 62 11/13/2017   Patient is newly on Vitamin D supplement.   Lab Results  Component Value Date   VD25OH 28 (L) 11/13/2017      Medication Review: Current Outpatient Medications on File Prior to Visit  Medication Sig Dispense Refill  . CHOLECALCIFEROL PO Take 1 tablet by mouth daily. Unsure of dose    . Flaxseed, Linseed, (FLAXSEED OIL) 1000 MG CAPS Take 1,000 mg by mouth daily.    Marland Kitchen gabapentin (NEURONTIN) 100 MG capsule Take 1 capsule (100 mg total) by mouth 3 (three) times daily. Take for pain. 90 capsule 1  . latanoprost (XALATAN) 0.005 % ophthalmic solution     . LORazepam (ATIVAN) 1 MG tablet TAKE 1 TABLET BY MOUTH THREE TIMES DAILY AS NEEDED 90 tablet 0  . magnesium oxide (MAG-OX) 400 MG tablet Take 400 mg by mouth daily.    . Multiple Vitamin (MULTIVITAMIN) tablet Take 1 tablet by mouth daily.    Marland Kitchen OVER THE COUNTER MEDICATION 450 mg daily. Tumeric     No current facility-administered medications on file prior to visit.     Allergies  Allergen Reactions  . Adhesive [Tape]   . Advil [Ibuprofen]   . Atenolol     Current Problems (verified) Patient Active Problem List   Diagnosis Date Noted  . Anxiety 03/04/2018  . Medication management 04/08/2014  . Vitamin D deficiency 07/01/2013  . Prediabetes 07/01/2013  . Hyperlipidemia 07/01/2013  . Essential hypertension 02/20/2010  . LBBB (left bundle branch block) 02/20/2010    Screening Tests Immunization History  Administered Date(s) Administered  . DTaP 05/06/2003  .  Pneumococcal Polysaccharide-23 05/06/2003  . Zoster 07/16/2007   Preventative care: Last colonoscopy: never, declines, will do hemoccult - has at home from CPE Last mammogram: 2012  Prior vaccinations: TD or Tdap: 2004, declines futher due to cost  Influenza: Declines Pneumococcal: 2004 Prevnar13: Declines Shingles/Zostavax: 2009  Names of Other Physician/Practitioners you currently use: 1. [facility]- here for primary care 2. Dr. Randon Goldsmith, eye doctor, last visit 2019, sees q33m 3. Dr.  Melynda Ripple, dentist, last visit 2019- goes annually   Patient Care Team: Lucky Cowboy, MD as PCP - General (Internal Medicine)  SURGICAL HISTORY She  has a past surgical history that includes Glaucoma surgery (2013) and Tonsillectomy. FAMILY HISTORY Her family history includes Diabetes in her father; Heart attack in her brother, brother, and father; Heart disease in her brother and brother; Hypertension in her mother; Stroke in her mother. SOCIAL HISTORY She  reports that she quit smoking about 38 years ago. She has never used smokeless tobacco. She reports that she does not drink alcohol or use drugs.   MEDICARE WELLNESS OBJECTIVES: Physical activity: Current Exercise Habits:  Home exercise routine, Type of exercise: walking, Time (Minutes): 30, Frequency (Times/Week): 5, Weekly Exercise (Minutes/Week): 150, Intensity: Mild, Exercise limited by: None identified Cardiac risk factors: Cardiac Risk Factors include: advanced age (>3455men, 54>65 women);hypertension;dyslipidemia;smoking/ tobacco exposure Depression/mood screen:   Dep[facility] screen [facility] 03/09/2018  Decreased Interest 0  Down, Depressed, Hopeless 0  PHQ - 2 Score 0    ADLs:  In your present state of health, do you have any difficulty performing the following activities: 03/09/2018 11/13/2017  Hearing? N N  Vision? N N  Difficulty concentrating or making decisions? N N  Walking or climbing stairs? N N   Dressing or bathing? N N  Doing errands, shopping? N N  Some recent data might be hidden     Cognitive Testing  Alert? Yes  Normal Appearance?Yes  Oriented to person? Yes  Place? Yes   Time? Yes  Recall of three objects?  Yes  Can perform simple calculations? Yes  Displays appropriate judgment?Yes  Can read the correct time from a watch face?Yes  EOL planning: Does Patient Have a Medical Advance Directive?: Yes Type of Advance Directive: Healthcare Power of Attorney, Living will Does patient want to make changes to medical advance directive?: No - Patient declined Copy of Healthcare Power of Attorney in Chart?: No - copy requested  Review of Systems  Constitutional: Negative for malaise/fatigue and weight loss.  HENT: Negative for hearing loss and tinnitus.   Eyes: Negative for blurred vision and double vision.  Respiratory: Negative for cough, sputum production, shortness of breath and wheezing.   Cardiovascular: Negative for chest pain, palpitations, orthopnea, claudication, leg swelling and PND.  Gastrointestinal: Negative for abdominal pain, blood in stool, constipation, diarrhea, heartburn, melena, nausea and vomiting.  Genitourinary: Negative.   Musculoskeletal: Negative for falls, joint pain and myalgias.  Skin: Negative for rash.  Neurological: Negative for dizziness, tingling, sensory change, weakness and headaches.  Endo/Heme/Allergies: Negative for polydipsia.  Psychiatric/Behavioral: Negative.  Negative for depression, memory loss, substance abuse and suicidal ideas. The patient is not nervous/anxious and does not have insomnia.   All other systems reviewed and are negative.    Objective:     Today's Vitals   03/09/18 1109  BP: (!) 142/82  Pulse: 78  Temp: (!) 97.3 F (36.3 C)  SpO2: 97%  Weight: 119 lb (54 kg)  Height: 5\' 2"  (1.575 m)   Body mass index is 21.77 kg/m.  General appearance: alert, no distress, WD/WN, female HEENT: normocephalic, sclerae  anicteric, TMs pearly, nares patent, no discharge or erythema, pharynx normal Oral cavity: MMM, no lesions Neck: supple, no lymphadenopathy, no thyromegaly, no masses Heart: RRR, normal S1, S2, no murmurs Lungs: CTA bilaterally, no wheezes, rhonchi, or rales Abdomen: +bs, soft, non tender, non distended, no masses, no hepatomegaly, no splenomegaly Musculoskeletal: nontender, no swelling, no obvious deformity Extremities: no edema, no cyanosis, no clubbing Pulses: 2+ symmetric, upper and lower extremities, normal cap refill Neurological: alert, oriented x 3, CN2-12 intact, strength normal upper extremities and lower extremities, sensation normal throughout, DTRs 2+ throughout, no cerebellar signs, gait normal Psychiatric: normal affect, behavior normal, pleasant   Medicare Attestation I have personally reviewed: The patient's medical and social history Their use of alcohol, tobacco or illicit drugs Their current medications and supplements The patient's functional ability including ADLs,fall risks, home safety risks, cognitive, and hearing and visual impairment Diet and physical activities Evidence for depression or mood disorders  The patient's weight, height, BMI, and visual acuity have been recorded in  the chart.  I have made referrals, counseling, and provided education to the patient based on review of the above and I have provided the patient with a written personalized care plan for preventive services.     Dan Maker, NP   03/09/2018

## 2018-03-09 ENCOUNTER — Ambulatory Visit (INDEPENDENT_AMBULATORY_CARE_PROVIDER_SITE_OTHER): Payer: Medicare Other | Admitting: Adult Health

## 2018-03-09 ENCOUNTER — Encounter: Payer: Self-pay | Admitting: Adult Health

## 2018-03-09 VITALS — BP 142/82 | HR 78 | Temp 97.3°F | Ht 62.0 in | Wt 119.0 lb

## 2018-03-09 DIAGNOSIS — R7303 Prediabetes: Secondary | ICD-10-CM | POA: Diagnosis not present

## 2018-03-09 DIAGNOSIS — F419 Anxiety disorder, unspecified: Secondary | ICD-10-CM | POA: Diagnosis not present

## 2018-03-09 DIAGNOSIS — E559 Vitamin D deficiency, unspecified: Secondary | ICD-10-CM

## 2018-03-09 DIAGNOSIS — Z79899 Other long term (current) drug therapy: Secondary | ICD-10-CM | POA: Diagnosis not present

## 2018-03-09 DIAGNOSIS — I447 Left bundle-branch block, unspecified: Secondary | ICD-10-CM

## 2018-03-09 DIAGNOSIS — I10 Essential (primary) hypertension: Secondary | ICD-10-CM

## 2018-03-09 DIAGNOSIS — Z0001 Encounter for general adult medical examination with abnormal findings: Secondary | ICD-10-CM | POA: Diagnosis not present

## 2018-03-09 DIAGNOSIS — Z6821 Body mass index (BMI) 21.0-21.9, adult: Secondary | ICD-10-CM

## 2018-03-09 DIAGNOSIS — E782 Mixed hyperlipidemia: Secondary | ICD-10-CM

## 2018-03-09 DIAGNOSIS — Z0000 Encounter for general adult medical examination without abnormal findings: Secondary | ICD-10-CM

## 2018-03-09 NOTE — Patient Instructions (Addendum)
Please complete home stool test (hemoccult) and drop this off or send in for completion.     [name] , Thank you for taking time to come for your Medicare Wellness Visit. I appreciate your ongoing commitment to your health goals. Please review the following plan we discussed and let me know if I can assist you in the future.   These are the goals we discussed: Goals    . Blood Pressure < 150/90       This is a list of the screening recommended for you and due dates:  Health Maintenance  Topic Date Due  . DEXA scan (bone density measurement)  03/10/2019*  . Flu Shot  Discontinued  . Tetanus Vaccine  Discontinued  . Pneumonia vaccines  Discontinued  *Topic was postponed. The date shown is not the original due date.      Aim for 7+ servings of fruits and vegetables daily  65-80+ fluid ounces of water or unsweet tea for healthy kidneys  Limit to max 1 drink of alcohol per day; avoid smoking/tobacco  Limit animal fats in diet for cholesterol and heart health - choose grass fed whenever available  Avoid highly processed foods, and foods high in saturated/trans fats  Aim for low stress - take time to unwind and care for your mental health  Aim for 150 min of moderate intensity exercise weekly for heart health, and weights twice weekly for bone health  Aim for 7-9 hours of sleep daily

## 2018-03-10 ENCOUNTER — Other Ambulatory Visit: Payer: Self-pay

## 2018-03-10 LAB — VITAMIN D 25 HYDROXY (VIT D DEFICIENCY, FRACTURES): Vit D, 25-Hydroxy: 38 ng/mL (ref 30–100)

## 2018-03-10 LAB — CBC WITH DIFFERENTIAL/PLATELET
BASOS ABS: 84 {cells}/uL (ref 0–200)
Basophils Relative: 1.1 %
Eosinophils Absolute: 122 cells/uL (ref 15–500)
Eosinophils Relative: 1.6 %
HEMATOCRIT: 42.6 % (ref 35.0–45.0)
Hemoglobin: 14.7 g/dL (ref 11.7–15.5)
LYMPHS ABS: 1049 {cells}/uL (ref 850–3900)
MCH: 29.8 pg (ref 27.0–33.0)
MCHC: 34.5 g/dL (ref 32.0–36.0)
MCV: 86.2 fL (ref 80.0–100.0)
MPV: 9.4 fL (ref 7.5–12.5)
Monocytes Relative: 8.5 %
NEUTROS PCT: 75 %
Neutro Abs: 5700 cells/uL (ref 1500–7800)
Platelets: 392 10*3/uL (ref 140–400)
RBC: 4.94 10*6/uL (ref 3.80–5.10)
RDW: 13.7 % (ref 11.0–15.0)
Total Lymphocyte: 13.8 %
WBC: 7.6 10*3/uL (ref 3.8–10.8)
WBCMIX: 646 {cells}/uL (ref 200–950)

## 2018-03-10 LAB — HEMOGLOBIN A1C
EAG (MMOL/L): 6.5 (calc)
HEMOGLOBIN A1C: 5.7 %{Hb} — AB (ref <5.7)
MEAN PLASMA GLUCOSE: 117 (calc)

## 2018-03-10 LAB — COMPLETE METABOLIC PANEL WITH GFR
AG RATIO: 1.8 (calc) (ref 1.0–2.5)
ALBUMIN MSPROF: 4.6 g/dL (ref 3.6–5.1)
ALT: 12 U/L (ref 6–29)
AST: 17 U/L (ref 10–35)
Alkaline phosphatase (APISO): 49 U/L (ref 33–130)
BUN: 17 mg/dL (ref 7–25)
CALCIUM: 10.5 mg/dL — AB (ref 8.6–10.4)
CHLORIDE: 99 mmol/L (ref 98–110)
CO2: 30 mmol/L (ref 20–32)
CREATININE: 0.86 mg/dL (ref 0.60–0.88)
GFR, Est African American: 69 mL/min/{1.73_m2} (ref >=60)
GFR, Est Non African American: 60 mL/min/{1.73_m2} (ref >=60)
GLOBULIN: 2.6 g/dL (ref 1.9–3.7)
Glucose, Bld: 95 mg/dL (ref 65–99)
Potassium: 4.6 mmol/L (ref 3.5–5.3)
Sodium: 136 mmol/L (ref 135–146)
TOTAL PROTEIN: 7.2 g/dL (ref 6.1–8.1)
Total Bilirubin: 0.7 mg/dL (ref 0.2–1.2)

## 2018-03-10 LAB — TSH: TSH: 2.02 m[IU]/L (ref 0.40–4.50)

## 2018-03-10 MED ORDER — CHOLECALCIFEROL 25 MCG (1000 UT) PO CAPS: 6000.0000 [IU] | ORAL_CAPSULE | Freq: Every day | ORAL | 0 refills | Status: AC

## 2018-04-10 ENCOUNTER — Other Ambulatory Visit: Payer: Self-pay | Admitting: Adult Health

## 2018-06-16 ENCOUNTER — Ambulatory Visit (INDEPENDENT_AMBULATORY_CARE_PROVIDER_SITE_OTHER): Payer: Medicare Other | Admitting: Internal Medicine

## 2018-06-16 ENCOUNTER — Encounter: Payer: Self-pay | Admitting: Internal Medicine

## 2018-06-16 VITALS — BP 140/86 | HR 85 | Temp 97.5°F | Ht 62.0 in | Wt 121.0 lb

## 2018-06-16 DIAGNOSIS — Z79899 Other long term (current) drug therapy: Secondary | ICD-10-CM | POA: Diagnosis not present

## 2018-06-16 DIAGNOSIS — E559 Vitamin D deficiency, unspecified: Secondary | ICD-10-CM | POA: Diagnosis not present

## 2018-06-16 DIAGNOSIS — R0989 Other specified symptoms and signs involving the circulatory and respiratory systems: Secondary | ICD-10-CM | POA: Diagnosis not present

## 2018-06-16 DIAGNOSIS — R7303 Prediabetes: Secondary | ICD-10-CM | POA: Diagnosis not present

## 2018-06-16 DIAGNOSIS — Z87891 Personal history of nicotine dependence: Secondary | ICD-10-CM | POA: Insufficient documentation

## 2018-06-16 DIAGNOSIS — E782 Mixed hyperlipidemia: Secondary | ICD-10-CM | POA: Diagnosis not present

## 2018-06-16 DIAGNOSIS — Z8249 Family history of ischemic heart disease and other diseases of the circulatory system: Secondary | ICD-10-CM | POA: Insufficient documentation

## 2018-06-16 DIAGNOSIS — R7309 Other abnormal glucose: Secondary | ICD-10-CM | POA: Diagnosis not present

## 2018-06-16 NOTE — Patient Instructions (Signed)

## 2018-06-16 NOTE — Progress Notes (Signed)
This very nice 84 y.o. MWF presents for 6 month follow up with HTN, HLD, Pre-Diabetes and Vitamin D Deficiency.      Patient is followed expectantly with hx/o labile HTN (1980)  & BP has been controlled at home. Today's BP is at goal - 140/86. Patient also has CKD2 felt consequent of her HTN.  Patient has had no complaints of any cardiac type chest pain, palpitations, dyspnea / orthopnea / PND, dizziness, claudication, or dependent edema.     Hyperlipidemia is near controlled with diet & meds. Patient denies myalgias or other med SE's. Last Lipids were near goal: Lab Results  Component Value Date   CHOL 181 11/13/2017   HDL 58 11/13/2017   LDLCALC 102 (H) 11/13/2017   TRIG 114 11/13/2017   CHOLHDL 3.1 11/13/2017      Also, the patient has history of PreDiabetes  (A1c 5.7% / 2012)  and has had no symptoms of reactive hypoglycemia, diabetic polys, paresthesias or visual blurring.  Last A1c was near goal: Lab Results  Component Value Date   HGBA1C 5.7 (H) 03/09/2018      Further, the patient also has history of Vitamin D Deficiency ("31" / 2018)  and supplements vitamin D without any suspected side-effects. Last vitamin D was still low: Lab Results  Component Value Date   VD25OH 38 03/09/2018   Current Outpatient Medications on File Prior to Visit  Medication Sig  . latanoprost (XALATAN) 0.005 % ophthalmic solution   . LORazepam (ATIVAN) 1 MG tablet Take 1/2-1 tablet 2 - 3 x /day ONLY if needed for Anxiety Attack & please  limit to 5 days /week to avoid addiction (Patient taking differently: at bedtime. Take 1/2-1 tablet 2 - 3 x /day ONLY if needed for Anxiety Attack & please  limit to 5 days /week to avoid addiction)  . magnesium oxide (MAG-OX) 400 MG tablet Take 400 mg by mouth daily.  . Multiple Vitamin (MULTIVITAMIN) tablet Take 1 tablet by mouth daily.  Marland Kitchen OVER THE COUNTER MEDICATION 450 mg daily. Tumeric  . Flaxseed, Linseed, (FLAXSEED OIL) 1000 MG CAPS Take 1,000 mg by mouth  daily.  Marland Kitchen gabapentin (NEURONTIN) 100 MG capsule Take 1 capsule (100 mg total) by mouth 3 (three) times daily. Take for pain. (Patient not taking: Reported on 06/16/2018)   No current facility-administered medications on file prior to visit.    Allergies  Allergen Reactions  . Adhesive [Tape]   . Advil [Ibuprofen]   . Atenolol    PMHx:   Immunization History  Administered Date(s) Administered  . DTaP 05/06/2003  . Pneumococcal Polysaccharide-23 05/06/2003  . Zoster 07/16/2007   Past Surgical History:  Procedure Laterality Date  . GLAUCOMA SURGERY  2013  . TONSILLECTOMY     FHx:    Reviewed / unchanged  SHx:    Reviewed / unchanged   Systems Review:  Constitutional: Denies fever, chills, wt changes, headaches, insomnia, fatigue, night sweats, change in appetite. Eyes: Denies redness, blurred vision, diplopia, discharge, itchy, watery eyes.  ENT: Denies discharge, congestion, post nasal drip, epistaxis, sore throat, earache, hearing loss, dental pain, tinnitus, vertigo, sinus pain, snoring.  CV: Denies chest pain, palpitations, irregular heartbeat, syncope, dyspnea, diaphoresis, orthopnea, PND, claudication or edema. Respiratory: denies cough, dyspnea, DOE, pleurisy, hoarseness, laryngitis, wheezing.  Gastrointestinal: Denies dysphagia, odynophagia, heartburn, reflux, water brash, abdominal pain or cramps, nausea, vomiting, bloating, diarrhea, constipation, hematemesis, melena, hematochezia  or hemorrhoids. Genitourinary: Denies dysuria, frequency, urgency, nocturia, hesitancy, discharge, hematuria  or flank pain. Musculoskeletal: Denies arthralgias, myalgias, stiffness, jt. swelling, pain, limping or strain/sprain.  Skin: Denies pruritus, rash, hives, warts, acne, eczema or change in skin lesion(s). Neuro: No weakness, tremor, incoordination, spasms, paresthesia or pain. Psychiatric: Denies confusion, memory loss or sensory loss. Endo: Denies change in weight, skin or hair change.   Heme/Lymph: No excessive bleeding, bruising or enlarged lymph nodes.  Physical Exam  BP 140/86   Pulse 85   Temp (!) 97.5 F (36.4 C)   Ht 5\' 2"  (1.575 m)   Wt 121 lb (54.9 kg)   SpO2 99%   BMI 22.13 kg/m   Appears  well nourished, well groomed  and in no distress.  Eyes: PERRLA, EOMs, conjunctiva no swelling or erythema. Sinuses: No frontal/maxillary tenderness ENT/Mouth: EAC's clear, TM's nl w/o erythema, bulging. Nares clear w/o erythema, swelling, exudates. Oropharynx clear without erythema or exudates. Oral hygiene is good. Tongue normal, non obstructing. Hearing intact.  Neck: Supple. Thyroid not palpable. Car 2+/2+ without bruits, nodes or JVD. Chest: Respirations nl with BS clear & equal w/o rales, rhonchi, wheezing or stridor.  Cor: Heart sounds normal w/ regular rate and rhythm without sig. murmurs, gallops, clicks or rubs. Peripheral pulses normal and equal  without edema.  Abdomen: Soft & bowel sounds normal. Non-tender w/o guarding, rebound, hernias, masses or organomegaly.  Lymphatics: Unremarkable.  Musculoskeletal: Full ROM all peripheral extremities, joint stability, 5/5 strength and normal gait.  Skin: Warm, dry without exposed rashes, lesions or ecchymosis apparent.  Neuro: Cranial nerves intact, reflexes equal bilaterally. Sensory-motor testing grossly intact. Tendon reflexes grossly intact.  Pysch: Alert & oriented x 3.  Insight and judgement nl & appropriate. No ideations.  Assessment and Plan:  1. Labile hypertension  - Continue medication, monitor blood pressure at home.  - Continue DASH diet.  Reminder to go to the ER if any CP,  SOB, nausea, dizziness, severe HA, changes vision/speech.  - CBC with Differential/Platelet - COMPLETE METABOLIC PANEL WITH GFR - Magnesium - TSH  2. Hyperlipidemia, mixed  - Continue diet/meds, exercise,& lifestyle modifications.  - Continue monitor periodic cholesterol/liver & renal functions   - Lipid panel -  TSH  3. Abnormal glucose  - Continue diet, exercise,  - lifestyle modifications.  - Monitor appropriate labs.  - Hemoglobin A1c  4. Vitamin D deficiency  - Continue supplementation.  - VITAMIN D 25 Hydroxyl  5. Prediabetes  - Hemoglobin A1c - Insulin, random  6. Medication management  - CBC with Differential/Platelet - COMPLETE METABOLIC PANEL WITH GFR - Magnesium - Lipid panel - TSH - Hemoglobin A1c - Insulin, random - VITAMIN D 25 Hydroxyl       Discussed  regular exercise, BP monitoring, weight control to achieve/maintain BMI less than 25 and discussed med and SE's. Recommended labs to assess and monitor clinical status with further disposition pending results of labs. Over 30 minutes of exam, counseling, chart review was performed.

## 2018-06-17 LAB — COMPLETE METABOLIC PANEL WITH GFR
AG Ratio: 1.7 (calc) (ref 1.0–2.5)
ALBUMIN MSPROF: 4.5 g/dL (ref 3.6–5.1)
ALKALINE PHOSPHATASE (APISO): 55 U/L (ref 33–130)
ALT: 14 U/L (ref 6–29)
AST: 19 U/L (ref 10–35)
BUN: 19 mg/dL (ref 7–25)
CALCIUM: 10.5 mg/dL — AB (ref 8.6–10.4)
CO2: 29 mmol/L (ref 20–32)
CREATININE: 0.77 mg/dL (ref 0.60–0.88)
Chloride: 99 mmol/L (ref 98–110)
GFR, EST NON AFRICAN AMERICAN: 68 mL/min/{1.73_m2} (ref >=60)
GFR, Est African American: 79 mL/min/{1.73_m2} (ref >=60)
GLUCOSE: 93 mg/dL (ref 65–99)
Globulin: 2.7 g/dL (calc) (ref 1.9–3.7)
Potassium: 4.6 mmol/L (ref 3.5–5.3)
Sodium: 138 mmol/L (ref 135–146)
Total Bilirubin: 0.7 mg/dL (ref 0.2–1.2)
Total Protein: 7.2 g/dL (ref 6.1–8.1)

## 2018-06-17 LAB — CBC WITH DIFFERENTIAL/PLATELET
Basophils Absolute: 59 cells/uL (ref 0–200)
Basophils Relative: 0.8 %
EOS PCT: 0.7 %
Eosinophils Absolute: 52 cells/uL (ref 15–500)
HCT: 43.4 % (ref 35.0–45.0)
HEMOGLOBIN: 15 g/dL (ref 11.7–15.5)
Lymphs Abs: 977 cells/uL (ref 850–3900)
MCH: 30.8 pg (ref 27.0–33.0)
MCHC: 34.6 g/dL (ref 32.0–36.0)
MCV: 89.1 fL (ref 80.0–100.0)
MPV: 9.3 fL (ref 7.5–12.5)
Monocytes Relative: 10.9 %
NEUTROS PCT: 74.4 %
Neutro Abs: 5506 cells/uL (ref 1500–7800)
Platelets: 394 10*3/uL (ref 140–400)
RBC: 4.87 10*6/uL (ref 3.80–5.10)
RDW: 13.2 % (ref 11.0–15.0)
TOTAL LYMPHOCYTE: 13.2 %
WBC mixed population: 807 cells/uL (ref 200–950)
WBC: 7.4 10*3/uL (ref 3.8–10.8)

## 2018-06-17 LAB — MAGNESIUM: Magnesium: 2.5 mg/dL (ref 1.5–2.5)

## 2018-06-17 LAB — LIPID PANEL
Cholesterol: 222 mg/dL — ABNORMAL HIGH (ref <200)
HDL: 59 mg/dL (ref >50)
LDL Cholesterol (Calc): 135 mg/dL (calc) — ABNORMAL HIGH
Non-HDL Cholesterol (Calc): 163 mg/dL (calc) — ABNORMAL HIGH (ref <130)
Total CHOL/HDL Ratio: 3.8 (calc) (ref <5.0)
Triglycerides: 149 mg/dL (ref <150)

## 2018-06-17 LAB — HEMOGLOBIN A1C
Hgb A1c MFr Bld: 5.6 % of total Hgb (ref <5.7)
Mean Plasma Glucose: 114 (calc)
eAG (mmol/L): 6.3 (calc)

## 2018-06-17 LAB — VITAMIN D 25 HYDROXY (VIT D DEFICIENCY, FRACTURES): Vit D, 25-Hydroxy: 33 ng/mL (ref 30–100)

## 2018-06-17 LAB — TSH: TSH: 2.07 mIU/L (ref 0.40–4.50)

## 2018-06-17 LAB — INSULIN, RANDOM: Insulin: 9.6 u[IU]/mL (ref 2.0–19.6)

## 2018-07-13 ENCOUNTER — Other Ambulatory Visit: Payer: Self-pay | Admitting: Internal Medicine

## 2018-07-13 MED ORDER — LORAZEPAM 1 MG PO TABS: ORAL_TABLET | ORAL | 0 refills | Status: DC

## 2018-07-31 DIAGNOSIS — H401431 Capsular glaucoma with pseudoexfoliation of lens, bilateral, mild stage: Secondary | ICD-10-CM | POA: Diagnosis not present

## 2018-07-31 DIAGNOSIS — Z961 Presence of intraocular lens: Secondary | ICD-10-CM | POA: Diagnosis not present

## 2018-07-31 DIAGNOSIS — H2512 Age-related nuclear cataract, left eye: Secondary | ICD-10-CM | POA: Diagnosis not present

## 2018-09-28 NOTE — Progress Notes (Signed)
3 Month Follow Up   Assessment and Plan:   Leslie [name] was seen today for follow-up.  Diagnoses and all orders for this visit:  Abnormal glucose Discussed dietary and exercise modifications -     Hemoglobin A1c  Essential hypertension Well controlled, no medications, diet and exercise controlled Reminder to go to the ER if any CP, SOB, nausea, dizziness, severe HA, changes vision/speech, left arm numbness and tingling and jaw pain. -     CBC with Differential/Platelet -     COMPLETE METABOLIC PANEL WITH GFR  Hyperlipidemia, mixed Discussed dietary and exercise modifications  Vitamin D deficiency -     VITAMIN D 25 Hydroxy (Vit-D Deficiency) DEXA due Continue suuplementation  Anxiety Doing well Taking lorazepam PRN 1/2 - 1 tablet, less than 3 days a week for severe anxiety. Using at night for sleep.  LBBB (left bundle branch block) Will continue to monitor Annual EKG Avoid rate controlling drugs  BMI 22.0-22.9 adult Encouraged healthy dietary and exercise habits  High risk medication use Discontinued opoid use and doing well with gabepentin and tylenol at this time. Denies any drowsiness from gabapentin. Call back with any questions or concerns.   Medication management -     CBC with Differential/Platelet -     COMPLETE METABOLIC PANEL WITH GFR -     VITAMIN D 25 Hydroxy (Vit-D Deficiency, Fractures) -     Hemoglobin A1c    Continue diet and meds as discussed. Further disposition pending results of labs. Discussed med's effects and SE's.   Over 30 minutes of exam, counseling, chart review, and critical decision making was performed.   Future Appointments  Date Time Provider Department Center  12/30/2018 11:00 AM Lucky Cowboy, MD [facility] None  03/15/2019 11:15 AM Judd Gaudier, NP [facility] None    ----------------------------------------------------------------------------------------------------------------------  HPI Leslie [name]  presents for 3  month follow up on HTN, HLD, history of abnormal glucose, weight, anxiety, and vitamin D deficiency.   Reports she is doing well and has no health concerns today.  She is able to manage all of her own self care and transportation.   BMI is Body mass index is 22.06 kg/m., she has been working on diet and exercise.  She reports that she walk 30 min 5 days a week.  Wt Readings from Last 3 Encounters:  09/29/18 120 lb 9.6 oz (54.7 kg)  06/16/18 121 lb (54.9 kg)  03/09/18 119 lb (54 kg)    HTN predates 1980 Her blood pressure has been controlled at home, today their BP is BP: 120/84  She does workout. She denies any cardiac symptoms, chest pains, palpitations, shortness of breath, dizziness or lower extremity edema.     She is not on cholesterol medication  Her cholesterol is not at goal. The cholesterol last visit was:   Lab Results  Component Value Date   CHOL 222 (H) 06/16/2018   HDL 59 06/16/2018   LDLCALC 135 (H) 06/16/2018   TRIG 149 06/16/2018   CHOLHDL 3.8 06/16/2018    She has been working on diet and exercise for prediabetes  (A1c 5.7% / 2012), and denies hyperglycemia, hypoglycemia , increased appetite, nausea, paresthesia of the feet, polydipsia, polyuria, visual disturbances, vomiting and weight loss. Last A1C in the office was:  Lab Results  Component Value Date   HGBA1C 5.6 09/29/2018   Patient is on Vitamin D supplement.   Lab Results  Component Value Date   VD25OH 26 (L) 09/29/2018  Current Medications:  Current Outpatient Medications on File Prior to Visit  Medication Sig  . Flaxseed, Linseed, (FLAXSEED OIL) 1000 MG CAPS Take 1,000 mg by mouth daily.  Marland Kitchen gabapentin (NEURONTIN) 100 MG capsule Take 1 capsule (100 mg total) by mouth 3 (three) times daily. Take for pain.  Marland Kitchen latanoprost (XALATAN) 0.005 % ophthalmic solution   . LORazepam (ATIVAN) 1 MG tablet Take 1/2-1 tablet 2 - 3 x /day ONLY if needed for Anxiety Attack & please  limit to 5 days /week to avoid  addiction  . magnesium oxide (MAG-OX) 400 MG tablet Take 400 mg by mouth daily.  . Multiple Vitamin (MULTIVITAMIN) tablet Take 1 tablet by mouth daily.  Marland Kitchen OVER THE COUNTER MEDICATION 450 mg daily. Tumeric   No current facility-administered medications on file prior to visit.     Allergies:  Allergies  Allergen Reactions  . Adhesive [Tape]   . Advil [Ibuprofen]   . Atenolol      Medical History:  History reviewed. No pertinent past medical history.  Family history- Reviewed and unchanged   Social history- Reviewed and unchanged   Names of Other Physician/Practitioners you currently use: 1. [facility] here for primary care  Patient Care Team: Lucky Cowboy, MD as PCP - General (Internal Medicine)   Screening Tests: Immunization History  Administered Date(s) Administered  . DTaP 05/06/2003  . Pneumococcal Polysaccharide-23 05/06/2003  . Zoster 07/16/2007     Vaccinations: TD or Tdap: 2004, declines futher due to cost     Influenza: Declines Pneumococcal: 2004 Prevnar13:  Declines Shingles/Zostavax:2009   Last colonoscopy: never, declines, will do hemoccult - has at home from CPE Last mammogram: 2012 DEXA: DUE  Imaging: Chest X-ray: 2013 EKG: N/A ECHO: N/A    Review of Systems:  Review of Systems  Constitutional: Negative for chills, diaphoresis, fever, malaise/fatigue and weight loss.  HENT: Negative for congestion, ear discharge, ear pain, hearing loss, nosebleeds, sinus pain, sore throat and tinnitus.   Eyes: Negative for blurred vision, double vision, photophobia, pain, discharge and redness.  Respiratory: Negative for cough, hemoptysis, sputum production, shortness of breath, wheezing and stridor.   Cardiovascular: Negative for chest pain, palpitations, orthopnea, claudication, leg swelling and PND.  Gastrointestinal: Negative for abdominal pain, blood in stool, constipation, diarrhea, heartburn, melena,  nausea and vomiting.  Genitourinary: Negative for dysuria, flank pain, frequency, hematuria and urgency.  Musculoskeletal: Negative for back pain, falls, joint pain, myalgias and neck pain.  Skin: Negative for itching and rash.  Neurological: Negative for dizziness, tingling, tremors, sensory change, speech change, focal weakness, seizures, loss of consciousness, weakness and headaches.  Endo/Heme/Allergies: Negative for environmental allergies and polydipsia. Does not bruise/bleed easily.  Psychiatric/Behavioral: Negative for depression, hallucinations, memory loss, substance abuse and suicidal ideas. The patient is not nervous/anxious and does not have insomnia.       Physical Exam: BP 120/84   Pulse 81   Temp (!) 97.4 F (36.3 C)   Ht 5\' 2"  (1.575 m)   Wt 120 lb 9.6 oz (54.7 kg)   SpO2 97%   BMI 22.06 kg/m  Wt Readings from Last 3 Encounters:  09/29/18 120 lb 9.6 oz (54.7 kg)  06/16/18 121 lb (54.9 kg)  03/09/18 119 lb (54 kg)   General Appearance: Well nourished, in no apparent distress. Eyes: PERRLA, EOMs, conjunctiva no swelling or erythema Sinuses: No Frontal/maxillary tenderness ENT/Mouth: Ext aud canals clear, TMs without erythema, bulging. No erythema, swelling, or exudate on post pharynx.  Tonsils not swollen or erythematous. Hearing normal.  Neck: Supple, thyroid normal.  Respiratory: Respiratory effort normal, BS equal bilaterally without rales, rhonchi, wheezing or stridor.  Cardio: RRR with no MRGs. Brisk peripheral pulses without edema.  Abdomen: Soft, + BS.  Non tender, no guarding, rebound, hernias, masses. Lymphatics: Non tender without lymphadenopathy.  Musculoskeletal: Full ROM, 5/5 strength, Normal gait Skin: Warm, dry without rashes, lesions, ecchymosis.  Neuro: Cranial nerves intact. No cerebellar symptoms.  Psych: Awake and oriented X 3, normal affect, Insight and Judgment appropriate.    Elder Negus, NP [facility] 11:49 AM

## 2018-09-29 ENCOUNTER — Encounter: Payer: Self-pay | Admitting: Adult Health Nurse Practitioner

## 2018-09-29 ENCOUNTER — Ambulatory Visit: Payer: Self-pay | Admitting: Physician Assistant

## 2018-09-29 ENCOUNTER — Other Ambulatory Visit: Payer: Self-pay

## 2018-09-29 ENCOUNTER — Ambulatory Visit (INDEPENDENT_AMBULATORY_CARE_PROVIDER_SITE_OTHER): Payer: Medicare Other | Admitting: Adult Health Nurse Practitioner

## 2018-09-29 VITALS — BP 120/84 | HR 81 | Temp 97.4°F | Ht 62.0 in | Wt 120.6 lb

## 2018-09-29 DIAGNOSIS — Z79899 Other long term (current) drug therapy: Secondary | ICD-10-CM

## 2018-09-29 DIAGNOSIS — I447 Left bundle-branch block, unspecified: Secondary | ICD-10-CM

## 2018-09-29 DIAGNOSIS — R7309 Other abnormal glucose: Secondary | ICD-10-CM

## 2018-09-29 DIAGNOSIS — E782 Mixed hyperlipidemia: Secondary | ICD-10-CM

## 2018-09-29 DIAGNOSIS — F419 Anxiety disorder, unspecified: Secondary | ICD-10-CM

## 2018-09-29 DIAGNOSIS — E559 Vitamin D deficiency, unspecified: Secondary | ICD-10-CM

## 2018-09-29 DIAGNOSIS — I10 Essential (primary) hypertension: Secondary | ICD-10-CM

## 2018-09-29 DIAGNOSIS — Z6822 Body mass index (BMI) 22.0-22.9, adult: Secondary | ICD-10-CM | POA: Diagnosis not present

## 2018-09-29 NOTE — Patient Instructions (Addendum)
We will call you in 1-3 days with your labs results.  Be sure to continue frequent hand washing and donot touch you eyes, mouth or face.     Coronavirus (COVID-19) Are you at risk?  Are you at risk for the Coronavirus (COVID-19)?  To be considered HIGH RISK for Coronavirus (COVID-19), you have to meet the following criteria:  . Traveled to [place], [place], [place], [place] or [place]; or in the [place] to [place], [place], [place], or [place]; and have fever, cough, and shortness of breath within the last 2 weeks of travel OR . Been in close contact with a person diagnosed with COVID-19 within the last 2 weeks and have fever, cough, and shortness of breath . IF YOU DO NOT MEET THESE CRITERIA, YOU ARE CONSIDERED LOW RISK FOR COVID-19.  What to do if you are HIGH RISK for COVID-19?  Marland Kitchen If you are having a medical emergency, call 911. . Seek medical care right away. Before you go to a doctor's office, urgent care or emergency department, call ahead and tell them about your recent travel, contact with someone diagnosed with COVID-19, and your symptoms. You should receive instructions from your physician's office regarding next steps of care.  .  . When you arrive at healthcare provider, tell the healthcare staff immediately you have returned from visiting [place], [place], [place], [place] or [place]; or traveled in the [place] to [place], [place], [place], or [place]; in the last two weeks or you have been in close contact with a person diagnosed with COVID-19 in the last 2 weeks.   . Tell the health care staff about your symptoms: fever, cough and shortness of breath. . After you have been seen by a medical provider, you will be either: o Tested for (COVID-19) and discharged home on quarantine except to seek medical care if symptoms worsen, and asked to  - Stay home and avoid contact with others until you get your results (4-5 days)  - Avoid travel on public  transportation if possible (such as bus, train, or airplane) or o Sent to the Emergency Department by EMS for evaluation, COVID-19 testing, and possible admission depending on your condition and test results.  What to do if you are LOW RISK for COVID-19?  Reduce your risk of any infection by using the same precautions used for avoiding the common cold or flu:  Marland Kitchen Wash your hands often with soap and warm water for at least 20 seconds.  If soap and water are not readily available, use an alcohol-based hand sanitizer with at least 60% alcohol.  . If coughing or sneezing, cover your mouth and nose by coughing or sneezing into the elbow areas of your shirt or coat, into a tissue or into your sleeve (not your hands). . Avoid shaking hands with others and consider head nods or verbal greetings only. . Avoid touching your eyes, nose, or mouth with unwashed hands.  . Avoid close contact with people who are sick. . Avoid places or events with large numbers of people in one location, like concerts or sporting events. . Carefully consider travel plans you have or are making. . If you are planning any travel outside or inside the [place], visit the CDC's Travelers' Health webpage for the latest health notices. . If you have some symptoms but not all symptoms, continue to monitor at home and seek medical attention if your symptoms worsen. . If you are having a medical  emergency, call 911.

## 2018-09-30 LAB — COMPLETE METABOLIC PANEL WITH GFR
ALT: 12 U/L (ref 6–29)
AST: 17 U/L (ref 10–35)
Albumin: 4.2 g/dL (ref 3.6–5.1)
Alkaline phosphatase (APISO): 47 U/L (ref 37–153)
BUN: 14 mg/dL (ref 7–25)
CO2: 25 mmol/L (ref 20–32)
Calcium: 10.3 mg/dL (ref 8.6–10.4)
Chloride: 99 mmol/L (ref 98–110)
Creat: 0.83 mg/dL (ref 0.60–0.88)
GFR, Est African American: 72 mL/min/{1.73_m2} (ref >=60)
GFR, Est Non African American: 62 mL/min/{1.73_m2} (ref >=60)
Globulin: 2.5 g/dL (calc) (ref 1.9–3.7)
Glucose, Bld: 91 mg/dL (ref 65–99)
Potassium: 4.5 mmol/L (ref 3.5–5.3)
Sodium: 134 mmol/L — ABNORMAL LOW (ref 135–146)
Total Bilirubin: 0.7 mg/dL (ref 0.2–1.2)
Total Protein: 6.7 g/dL (ref 6.1–8.1)

## 2018-09-30 LAB — CBC WITH DIFFERENTIAL/PLATELET
Absolute Monocytes: 661 {cells}/uL (ref 200–950)
Basophils Absolute: 61 cells/uL (ref 0–200)
Basophils Relative: 0.8 %
Eosinophils Absolute: 53 cells/uL (ref 15–500)
Eosinophils Relative: 0.7 %
HCT: 44.8 % (ref 35.0–45.0)
Hemoglobin: 15.1 g/dL (ref 11.7–15.5)
Lymphs Abs: 1034 cells/uL (ref 850–3900)
MCH: 30 pg (ref 27.0–33.0)
MCHC: 33.7 g/dL (ref 32.0–36.0)
MCV: 89.1 fL (ref 80.0–100.0)
MPV: 9.6 fL (ref 7.5–12.5)
Monocytes Relative: 8.7 %
Neutro Abs: 5791 cells/uL (ref 1500–7800)
Neutrophils Relative %: 76.2 %
Platelets: 388 10*3/uL (ref 140–400)
RBC: 5.03 10*6/uL (ref 3.80–5.10)
RDW: 13.8 % (ref 11.0–15.0)
Total Lymphocyte: 13.6 %
WBC: 7.6 10*3/uL (ref 3.8–10.8)

## 2018-09-30 LAB — HEMOGLOBIN A1C
Hgb A1c MFr Bld: 5.6 % of total Hgb (ref <5.7)
Mean Plasma Glucose: 114 (calc)
eAG (mmol/L): 6.3 (calc)

## 2018-09-30 LAB — COMPLETE METABOLIC PANEL WITHOUT GFR: AG Ratio: 1.7 (calc) (ref 1.0–2.5)

## 2018-09-30 LAB — VITAMIN D 25 HYDROXY (VIT D DEFICIENCY, FRACTURES): Vit D, 25-Hydroxy: 26 ng/mL — ABNORMAL LOW (ref 30–100)

## 2018-10-04 ENCOUNTER — Encounter: Payer: Self-pay | Admitting: Adult Health Nurse Practitioner

## 2018-10-08 ENCOUNTER — Other Ambulatory Visit: Payer: Self-pay | Admitting: Internal Medicine

## 2018-10-08 MED ORDER — LORAZEPAM 1 MG PO TABS: ORAL_TABLET | ORAL | 0 refills | Status: DC

## 2018-12-14 ENCOUNTER — Encounter: Payer: Self-pay | Admitting: Internal Medicine

## 2018-12-29 ENCOUNTER — Encounter: Payer: Self-pay | Admitting: Internal Medicine

## 2018-12-29 NOTE — Progress Notes (Signed)
[facility] Lucky CowboyWilliam Korde Jeppsen, M.D.     Dyanne CarrelAmanda R. Steffanie Dunnollier, P.A.-C Judd GaudierAshley Corbett, DNP _______________________________________________ [facility] [address] [place], [place] [id number] Telephone [phone] Telefax [phone] Comprehensive Evaluation &  Examination     This very nice 85 y.[facility]. [facility] presents for a  comprehensive evaluation and management of multiple medical co-morbidities.  Patient has been followed for HTN, HLD, T2_NIDDM  Prediabetes  and Vitamin D Deficiency.     Patient has hx/o mild labile HTN followed expectantly since 1980.  She also has CKD2 felt consequent of her labile HTN. Patient's BP has been controlled at home and patient denies any cardiac symptoms as chest pain, palpitations, shortness of breath, dizziness or ankle swelling. Today's BP is elevated at 158/86 and re-confirmed x 2.     Patient's hyperlipidemia is not controlled with diet and Flaxseed oil caps. Patient denies myalgias or other medication SE's. Last lipids were not at goil. Lab Results  Component Value Date   CHOL 222 (H) 06/16/2018   HDL 59 06/16/2018   LDLCALC 135 (H) 06/16/2018   TRIG 149 06/16/2018   CHOLHDL 3.8 06/16/2018      Patient has hx/o prediabetes (A1c 5.7% / 2012) and patient denies reactive hypoglycemic symptoms, visual blurring, diabetic polys or paresthesias. Last A1c was Normal & at goal: Lab Results  Component Value Date   HGBA1C 5.6 09/29/2018      Finally, patient has history of Vitamin D Deficiency and last Vitamin D  ("31" / 2018)  was still very low: Lab Results  Component Value Date   VD25OH 26 (L) 09/29/2018   Current Outpatient Medications on File Prior to Visit  Medication Sig  . cholecalciferol (VITAMIN D3) 25 MCG (1000 UT) tablet Take 1,000 Units by mouth 3 (three) times daily.  . Flaxseed, Linseed, (FLAXSEED OIL) 1000 MG CAPS Take 1,000 mg by mouth daily.  Marland Kitchen. gabapentin (NEURONTIN) 100 MG  capsule Take 1 capsule (100 mg total) by mouth 3 (three) times daily. Take for pain.  Marland Kitchen. latanoprost (XALATAN) 0.005 % ophthalmic solution   . LORazepam (ATIVAN) 1 MG tablet Take 1/2-1 tablet 2 - 3 x /day ONLY if needed for Anxiety Attack & please  limit to 5 days /week to avoid addiction  . magnesium oxide (MAG-OX) 400 MG tablet Take 400 mg by mouth daily.  . Multiple Vitamin (MULTIVITAMIN) tablet Take 1 tablet by mouth daily.  Marland Kitchen. OVER THE COUNTER MEDICATION 450 mg daily. Tumeric   No current facility-administered medications on file prior to visit.    Allergies  Allergen Reactions  . Adhesive [Tape]   . Advil [Ibuprofen]   . Atenolol    History reviewed. No pertinent past medical history.   Health Maintenance  Topic Date Due  . DEXA SCAN  03/10/2019 (Originally 07/16/1993)  . INFLUENZA VACCINE  Discontinued  . TETANUS/TDAP  Discontinued  . PNA vac Low Risk Adult  Discontinued   Immunization History  Administered Date(s) Administered  . DTaP 05/06/2003  . Pneumococcal Polysaccharide-23 05/06/2003  . Zoster 07/16/2007   Last Colon - patient refuses f/u  Last MGM -  11/2010 - patient refuses f/u  Past Surgical History:  Procedure Laterality Date  . GLAUCOMA SURGERY  2013  . TONSILLECTOMY     Family History  Problem Relation Age of Onset  . Heart attack Brother   . Heart disease Brother   . Heart attack Brother   . Heart disease Brother   . Hypertension  Mother   . Stroke Mother   . Heart attack Father   . Diabetes Father    Social History   Tobacco Use  . Smoking status: Former Smoker    Quit date: 07/16/1979    Years since quitting: 39.4  . Smokeless tobacco: Never Used  Substance Use Topics  . Alcohol use: No    Alcohol/week: 0.0 standard drinks  . Drug use: No    ROS Constitutional: Denies fever, chills, weight loss/gain, headaches, insomnia,  night sweats, and change in appetite. Does c/o fatigue. Eyes: Denies redness, blurred vision, diplopia, discharge,  itchy, watery eyes.  ENT: Denies discharge, congestion, post nasal drip, epistaxis, sore throat, earache, hearing loss, dental pain, Tinnitus, Vertigo, Sinus pain, snoring.  Cardio: Denies chest pain, palpitations, irregular heartbeat, syncope, dyspnea, diaphoresis, orthopnea, PND, claudication, edema Respiratory: denies cough, dyspnea, DOE, pleurisy, hoarseness, laryngitis, wheezing.  Gastrointestinal: Denies dysphagia, heartburn, reflux, water brash, pain, cramps, nausea, vomiting, bloating, diarrhea, constipation, hematemesis, melena, hematochezia, jaundice, hemorrhoids Genitourinary: Denies dysuria, frequency, urgency, nocturia, hesitancy, discharge, hematuria, flank pain Breast: Breast lumps, nipple discharge, bleeding.  Musculoskeletal: Denies arthralgia, myalgia, stiffness, Jt. Swelling, pain, limp, and strain/sprain. Denies falls. Skin: Denies puritis, rash, hives, warts, acne, eczema, changing in skin lesion Neuro: No weakness, tremor, incoordination, spasms, paresthesia, pain Psychiatric: Denies confusion, memory loss, sensory loss. Denies Depression. Endocrine: Denies change in weight, skin, hair change, nocturia, and paresthesia, diabetic polys, visual blurring, hyper / hypo glycemic episodes.  Heme/Lymph: No excessive bleeding, bruising, enlarged lymph nodes.  Physical Exam  BP (!) 158/86   Pulse 88   Temp (!) 97 F (36.1 C)   Resp 16   Ht 5\' 1"  (1.549 m)   Wt 120 lb 9.6 oz (54.7 kg)   BMI 22.79 kg/m   General Appearance: Well nourished, well groomed and in no apparent distress.  Eyes: PERRLA, EOMs, conjunctiva no swelling or erythema, normal fundi and vessels. Sinuses: No frontal/maxillary tenderness ENT/Mouth: EACs patent / TMs  nl. Nares clear without erythema, swelling, mucoid exudates. Oral hygiene is good. No erythema, swelling, or exudate. Tongue normal, non-obstructing. Tonsils not swollen or erythematous. Hearing normal.  Neck: Supple, thyroid not palpable. No  bruits, nodes or JVD. Respiratory: Respiratory effort normal.  BS equal and clear bilateral without rales, rhonci, wheezing or stridor. Cardio: Heart sounds are normal with regular rate and rhythm and no murmurs, rubs or gallops. Peripheral pulses are normal and equal bilaterally without edema. No aortic or femoral bruits. Chest: symmetric with normal excursions and percussion. Breasts: Symmetric, without lumps, nipple discharge, retractions, or fibrocystic changes.  Abdomen: Flat, soft with bowel sounds active. Nontender, no guarding, rebound, hernias, masses, or organomegaly.  Lymphatics: Non tender without lymphadenopathy.  Musculoskeletal: Full ROM all peripheral extremities, joint stability, 5/5 strength, and normal gait. Skin: Warm and dry without rashes, lesions, cyanosis, clubbing or  ecchymosis.  Neuro: Cranial nerves intact, reflexes equal bilaterally. Normal muscle tone, no cerebellar symptoms. Sensation intact.  Pysch: Alert and oriented X 3, normal affect, Insight and Judgment appropriate.   Assessment and Plan  1. Labile hypertension  - EKG 12-Lead - Urinalysis, Routine w reflex microscopic - Microalbumin / creatinine urine ratio - CBC with Differential/Platelet - COMPLETE METABOLIC PANEL WITH GFR - Magnesium - TSH  2. Hyperlipidemia, mixed  - EKG 12-Lead - Lipid panel - TSH  3. Abnormal glucose  - EKG 12-Lead - Hemoglobin A1c - Insulin, random  4. Vitamin D deficiency  - VITAMIN D 25 Hydroxyl  5. Prediabetes  -  EKG 12-Lead - Hemoglobin A1c - Insulin, random  6. LBBB (left bundle branch block)  - EKG 12-Lead  7. Screening for colorectal cancer  - POC Hemoccult Bld/Stl   8. Screening for ischemic heart disease  - EKG 12-Lead  9. FHx: heart disease  - EKG 12-Lead  10. Former smoker  - EKG 12-Lead  11. Medication management  - Urinalysis, Routine w reflex microscopic - Microalbumin / creatinine urine ratio - CBC with  Differential/Platelet - COMPLETE METABOLIC PANEL WITH GFR - Magnesium - Lipid panel - TSH - Hemoglobin A1c - Insulin, random - VITAMIN D 25 Hydroxyl      Patient was counseled in prudent diet to achieve/maintain BMI less than 25 for weight control, BP monitoring, regular exercise and medications. Discussed med's effects and SE's. Screening labs and tests as requested with regular follow-up as recommended. Over 46 minutes of exam, counseling, chart review and high complex critical decision making was performed.   Marinus MawWilliam D Neiman Roots, MD

## 2018-12-29 NOTE — Patient Instructions (Signed)

## 2018-12-30 ENCOUNTER — Ambulatory Visit (INDEPENDENT_AMBULATORY_CARE_PROVIDER_SITE_OTHER): Payer: Medicare Other | Admitting: Internal Medicine

## 2018-12-30 ENCOUNTER — Encounter: Payer: Self-pay | Admitting: Internal Medicine

## 2018-12-30 ENCOUNTER — Other Ambulatory Visit: Payer: Self-pay

## 2018-12-30 VITALS — BP 158/86 | HR 88 | Temp 97.0°F | Resp 16 | Ht 61.0 in | Wt 120.6 lb

## 2018-12-30 DIAGNOSIS — R7303 Prediabetes: Secondary | ICD-10-CM | POA: Diagnosis not present

## 2018-12-30 DIAGNOSIS — Z87891 Personal history of nicotine dependence: Secondary | ICD-10-CM

## 2018-12-30 DIAGNOSIS — I447 Left bundle-branch block, unspecified: Secondary | ICD-10-CM | POA: Diagnosis not present

## 2018-12-30 DIAGNOSIS — Z8249 Family history of ischemic heart disease and other diseases of the circulatory system: Secondary | ICD-10-CM | POA: Diagnosis not present

## 2018-12-30 DIAGNOSIS — R0989 Other specified symptoms and signs involving the circulatory and respiratory systems: Secondary | ICD-10-CM | POA: Diagnosis not present

## 2018-12-30 DIAGNOSIS — E782 Mixed hyperlipidemia: Secondary | ICD-10-CM | POA: Diagnosis not present

## 2018-12-30 DIAGNOSIS — R7309 Other abnormal glucose: Secondary | ICD-10-CM | POA: Diagnosis not present

## 2018-12-30 DIAGNOSIS — Z79899 Other long term (current) drug therapy: Secondary | ICD-10-CM | POA: Diagnosis not present

## 2018-12-30 DIAGNOSIS — Z1211 Encounter for screening for malignant neoplasm of colon: Secondary | ICD-10-CM

## 2018-12-30 DIAGNOSIS — Z136 Encounter for screening for cardiovascular disorders: Secondary | ICD-10-CM

## 2018-12-30 DIAGNOSIS — E559 Vitamin D deficiency, unspecified: Secondary | ICD-10-CM

## 2018-12-30 DIAGNOSIS — Z1212 Encounter for screening for malignant neoplasm of rectum: Secondary | ICD-10-CM

## 2018-12-31 LAB — TSH: TSH: 1.93 mIU/L (ref 0.40–4.50)

## 2018-12-31 LAB — CBC WITH DIFFERENTIAL/PLATELET
Absolute Monocytes: 517 cells/uL (ref 200–950)
Basophils Absolute: 76 cells/uL (ref 0–200)
Basophils Relative: 1 %
Eosinophils Absolute: 53 cells/uL (ref 15–500)
Eosinophils Relative: 0.7 %
HCT: 43.5 % (ref 35.0–45.0)
Hemoglobin: 14.6 g/dL (ref 11.7–15.5)
Lymphs Abs: 958 cells/uL (ref 850–3900)
MCH: 30.1 pg (ref 27.0–33.0)
MCHC: 33.6 g/dL (ref 32.0–36.0)
MCV: 89.7 fL (ref 80.0–100.0)
MPV: 9.8 fL (ref 7.5–12.5)
Monocytes Relative: 6.8 %
Neutro Abs: 5996 cells/uL (ref 1500–7800)
Neutrophils Relative %: 78.9 %
Platelets: 397 10*3/uL (ref 140–400)
RBC: 4.85 10*6/uL (ref 3.80–5.10)
RDW: 13.7 % (ref 11.0–15.0)
Total Lymphocyte: 12.6 %
WBC: 7.6 10*3/uL (ref 3.8–10.8)

## 2018-12-31 LAB — COMPLETE METABOLIC PANEL WITH GFR
AG Ratio: 1.7 (calc) (ref 1.0–2.5)
ALT: 12 U/L (ref 6–29)
AST: 15 U/L (ref 10–35)
Albumin: 4.4 g/dL (ref 3.6–5.1)
Alkaline phosphatase (APISO): 47 U/L (ref 37–153)
BUN: 18 mg/dL (ref 7–25)
CO2: 26 mmol/L (ref 20–32)
Calcium: 10.1 mg/dL (ref 8.6–10.4)
Chloride: 102 mmol/L (ref 98–110)
Creat: 0.7 mg/dL (ref 0.60–0.88)
GFR, Est African American: 88 mL/min/{1.73_m2} (ref >=60)
GFR, Est Non African American: 76 mL/min/{1.73_m2} (ref >=60)
Globulin: 2.6 g/dL (calc) (ref 1.9–3.7)
Glucose, Bld: 96 mg/dL (ref 65–99)
Potassium: 4.1 mmol/L (ref 3.5–5.3)
Sodium: 137 mmol/L (ref 135–146)
Total Bilirubin: 0.6 mg/dL (ref 0.2–1.2)
Total Protein: 7 g/dL (ref 6.1–8.1)

## 2018-12-31 LAB — LIPID PANEL
Cholesterol: 184 mg/dL (ref <200)
HDL: 55 mg/dL (ref >=50)
LDL Cholesterol (Calc): 109 mg/dL (calc) — ABNORMAL HIGH
Non-HDL Cholesterol (Calc): 129 mg/dL (calc) (ref <130)
Total CHOL/HDL Ratio: 3.3 (calc) (ref <5.0)
Triglycerides: 108 mg/dL (ref <150)

## 2018-12-31 LAB — URINALYSIS, ROUTINE W REFLEX MICROSCOPIC
Bilirubin Urine: NEGATIVE
Glucose, UA: NEGATIVE
Hyaline Cast: NONE SEEN /LPF
Ketones, ur: NEGATIVE
Nitrite: POSITIVE — AB
Protein, ur: NEGATIVE
Specific Gravity, Urine: 1.013 (ref 1.001–1.03)
Squamous Epithelial / HPF: NONE SEEN /HPF (ref <=5)
WBC, UA: >=60 /HPF — AB (ref 0–5)
pH: 6.5 (ref 5.0–8.0)

## 2018-12-31 LAB — MICROALBUMIN / CREATININE URINE RATIO
Creatinine, Urine: 67 mg/dL (ref 20–275)
Microalb Creat Ratio: 61 mcg/mg creat — ABNORMAL HIGH (ref <30)
Microalb, Ur: 4.1 mg/dL

## 2018-12-31 LAB — HEMOGLOBIN A1C
Hgb A1c MFr Bld: 5.5 % of total Hgb (ref <5.7)
Mean Plasma Glucose: 111 (calc)
eAG (mmol/L): 6.2 (calc)

## 2018-12-31 LAB — VITAMIN D 25 HYDROXY (VIT D DEFICIENCY, FRACTURES): Vit D, 25-Hydroxy: 27 ng/mL — ABNORMAL LOW (ref 30–100)

## 2018-12-31 LAB — INSULIN, RANDOM: Insulin: 4.9 u[IU]/mL

## 2018-12-31 LAB — MAGNESIUM: Magnesium: 2.1 mg/dL (ref 1.5–2.5)

## 2019-01-02 ENCOUNTER — Other Ambulatory Visit: Payer: Self-pay | Admitting: Internal Medicine

## 2019-01-02 DIAGNOSIS — N3000 Acute cystitis without hematuria: Secondary | ICD-10-CM

## 2019-01-02 MED ORDER — CIPROFLOXACIN HCL 250 MG PO TABS: ORAL_TABLET | ORAL | 0 refills | Status: DC

## 2019-01-05 ENCOUNTER — Other Ambulatory Visit: Payer: Self-pay | Admitting: Internal Medicine

## 2019-01-05 MED ORDER — LORAZEPAM 1 MG PO TABS: ORAL_TABLET | ORAL | 0 refills | Status: DC

## 2019-01-28 DIAGNOSIS — H401431 Capsular glaucoma with pseudoexfoliation of lens, bilateral, mild stage: Secondary | ICD-10-CM | POA: Diagnosis not present

## 2019-02-03 ENCOUNTER — Ambulatory Visit (INDEPENDENT_AMBULATORY_CARE_PROVIDER_SITE_OTHER): Payer: Medicare Other

## 2019-02-03 ENCOUNTER — Other Ambulatory Visit: Payer: Self-pay

## 2019-02-03 DIAGNOSIS — N3000 Acute cystitis without hematuria: Secondary | ICD-10-CM

## 2019-02-03 NOTE — Progress Notes (Signed)
Patient reports for LAB work. Patient reports that she did NOT finish the ABX due to the ABX making her feel really bad, so she used AZO (otc) & drank water which seems to have helped her & patient reports that's she feels better.

## 2019-02-05 LAB — URINALYSIS, ROUTINE W REFLEX MICROSCOPIC
Bacteria, UA: NONE SEEN /HPF
Bilirubin Urine: NEGATIVE
Glucose, UA: NEGATIVE
Hgb urine dipstick: NEGATIVE
Hyaline Cast: NONE SEEN /LPF
Ketones, ur: NEGATIVE
Nitrite: NEGATIVE
Protein, ur: NEGATIVE
RBC / HPF: NONE SEEN /HPF (ref 0–2)
Specific Gravity, Urine: 1.013 (ref 1.001–1.03)
WBC, UA: NONE SEEN /HPF (ref 0–5)
pH: 7 (ref 5.0–8.0)

## 2019-02-05 LAB — URINE CULTURE
MICRO NUMBER:: 694102
SPECIMEN QUALITY:: ADEQUATE

## 2019-03-15 ENCOUNTER — Ambulatory Visit: Payer: Self-pay | Admitting: Adult Health

## 2019-04-05 NOTE — Progress Notes (Signed)
MEDICARE ANNUAL WELLNESS VISIT AND FOLLOW UP  Assessment:    Diagnoses and all orders for this visit:  Encounter for Medicare annual wellness exam  LBBB (left bundle branch block) Avoid rate controlling drugs, monitor, annual EKG  Labile hypertension Currently off of medications and at goal Monitor blood pressure at home; call if consistently over 130/80 Continue DASH diet.   Reminder to go to the ER if any CP, SOB, nausea, dizziness, severe HA, changes vision/speech, left arm numbness and tingling and jaw pain.  Vitamin D deficiency Newly taking 3000 IU;  Declines labs today Check at follow up in 3 months   Hx of Prediabetes Discussed disease and risks Discussed diet/exercise, weight management  A1C annually; monitor CMP/weight  Medication management CBC, CMP/GFR  Hyperlipidemia No longer treated secondary to age; check annually at CPE Continue lifestyle  Anxiety Takes ativan at night as needed for sleep; anxiety recently well controlled Continue lifestyle efforts  BMI 22 Continue to recommend diet heavy in fruits and veggies and low in animal meats, cheeses, and dairy products, appropriate calorie intake Discuss exercise recommendations routinely Continue to monitor weight at each visit  She declined all labs today   Over 40 minutes of exam, counseling, chart review and critical decision making was performed Future Appointments  Date Time Provider Department Center  07/07/2019 10:30 AM Lucky Cowboy, MD [facility] None  02/02/2020 10:00 AM Lucky Cowboy, MD [facility] None     Plan:   During the course of the visit the patient was educated and counseled about appropriate screening and preventive services including:    Pneumococcal vaccine   Prevnar 13  Influenza vaccine  Td vaccine  Screening electrocardiogram  Bone densitometry screening  Colorectal cancer screening  Diabetes screening  Glaucoma screening  Nutrition counseling    Advanced directives: requested   Subjective:  Leslie [name] is a 85 y.o. female who presents for Medicare Annual Wellness Visit and 3 month follow up.   She is currently prescribed ativan 1 mg for anxiety; she reports she typically takes at night for sleep if needed. Hasn't needed during the day.   She has also been prescribed tramadol for aches and pains in the past; she has been transitioned to tylenol and gabapentin due to opioid prescription but she reports she never takes, has been going to the chiropractor every two weeks and has been able to tolerate residual pain.   BMI is Body mass index is 22.17 kg/m., she has been working on diet and exercise. Wt Readings from Last 3 Encounters:  04/06/19 121 lb 3.2 oz (55 kg)  02/03/19 121 lb 12.8 oz (55.2 kg)  12/30/18 120 lb 9.6 oz (54.7 kg)    Her blood pressure has been controlled at home, today their BP is BP: 126/78 She does workout. She denies chest pain, shortness of breath, dizziness.   She is not on cholesterol medication and denies myalgias. Her cholesterol is at goal. The cholesterol last visit was:   Lab Results  Component Value Date   CHOL 184 12/30/2018   HDL 55 12/30/2018   LDLCALC 109 (H) 12/30/2018   TRIG 108 12/30/2018   CHOLHDL 3.3 12/30/2018    She has been working on diet and exercise for hx prediabetes (rarely eats bread, does have a sweet tooth), and denies foot ulcerations, increased appetite, nausea, paresthesia of the feet, polydipsia, polyuria, visual disturbances, vomiting and weight loss. Last A1C in the office was:  Lab Results  Component Value Date   HGBA1C  5.5 12/30/2018   Last GFR: Lab Results  Component Value Date   GFRNONAA 76 12/30/2018   Patient is on Vitamin D supplement, newly taking 3000 IU since last visit   Lab Results  Component Value Date   VD25OH 41 (L) 12/30/2018      Medication Review: Current Outpatient Medications on File Prior to Visit  Medication Sig Dispense Refill  .  cholecalciferol (VITAMIN D3) 25 MCG (1000 UT) tablet Take 3,000 Units by mouth daily.     Marland Kitchen latanoprost (XALATAN) 0.005 % ophthalmic solution     . Flaxseed, Linseed, (FLAXSEED OIL) 1000 MG CAPS Take 1,000 mg by mouth daily.    Marland Kitchen gabapentin (NEURONTIN) 100 MG capsule Take 1 capsule (100 mg total) by mouth 3 (three) times daily. Take for pain. (Patient not taking: Reported on 04/06/2019) 90 capsule 1  . magnesium oxide (MAG-OX) 400 MG tablet Take 400 mg by mouth daily.    . Multiple Vitamin (MULTIVITAMIN) tablet Take 1 tablet by mouth daily.    Marland Kitchen OVER THE COUNTER MEDICATION 450 mg daily. Tumeric     No current facility-administered medications on file prior to visit.     Allergies  Allergen Reactions  . Adhesive [Tape]   . Advil [Ibuprofen]   . Atenolol     Current Problems (verified) Patient Active Problem List   Diagnosis Date Noted  . Labile hypertension 06/16/2018  . FHx: heart disease 06/16/2018  . Former smoker 06/16/2018  . Abnormal glucose 06/16/2018  . Anxiety 03/04/2018  . Medication management 04/08/2014  . Vitamin D deficiency 07/01/2013  . Hyperlipidemia, mixed 07/01/2013  . LBBB (left bundle branch block) 02/20/2010    Screening Tests Immunization History  Administered Date(s) Administered  . DTaP 05/06/2003  . Pneumococcal Polysaccharide-23 05/06/2003  . Zoster 07/16/2007   Preventative care: Last colonoscopy: never, declines, ordered hemoccult at CPE, denies further  Last mammogram: 2012  Last CXR: 2013, former smoker quit in 1981, declines repeat CXR today   Prior vaccinations: TD or Tdap: 2004, declines futher due to cost  Influenza: Declines Pneumococcal: 2004 Prevnar13: Declines Shingles/Zostavax: 2009  Names of Other Physician/Practitioners you currently use: 1. [facility]- here for primary care 2. Dr. Prudencio Burly, eye doctor, last visit 2020, sees q61m, glaucoma well controlled with drops 3. Dr.  Aggie Moats,  dentist, last visit 2020- goes annually   Patient Care Team: Unk Pinto, MD as PCP - General (Internal Medicine)  SURGICAL HISTORY She  has a past surgical history that includes Glaucoma surgery (2013) and Tonsillectomy. FAMILY HISTORY Her family history includes Diabetes in her father; Heart attack in her brother, brother, and father; Heart disease in her brother and brother; Hypertension in her mother; Stroke in her mother. SOCIAL HISTORY She  reports that she quit smoking about 39 years ago. She has never used smokeless tobacco. She reports that she does not drink alcohol or use drugs.   MEDICARE WELLNESS OBJECTIVES: Physical activity: Current Exercise Habits: Home exercise routine, Type of exercise: walking, Time (Minutes): 30, Frequency (Times/Week): 5, Weekly Exercise (Minutes/Week): 150, Intensity: Mild, Exercise limited by: None identified Cardiac risk factors: Cardiac Risk Factors include: advanced age (>52men, >6 women);dyslipidemia;smoking/ tobacco exposure Depression/mood screen:   Depression screen [facility] 2/9 04/06/2019  Decreased Interest 0  Down, Depressed, Hopeless 0  PHQ - 2 Score 0    ADLs:  In your present state of health, do you have any difficulty performing the following activities: 04/06/2019 12/29/2018  Hearing? N N  Vision?  N N  Difficulty concentrating or making decisions? N N  Walking or climbing stairs? N N  Dressing or bathing? N N  Doing errands, shopping? N N  Some recent data might be hidden     Cognitive Testing  Alert? Yes  Normal Appearance?Yes  Oriented to person? Yes  Place? Yes   Time? Yes  Recall of three objects?  Yes  Can perform simple calculations? Yes  Displays appropriate judgment?Yes  Can read the correct time from a watch face?Yes  EOL planning: Does Patient Have a Medical Advance Directive?: Yes Type of Advance Directive: Healthcare Power of Attorney, Living will Does patient want to make changes to medical advance  directive?: No - Patient declined Copy of Healthcare Power of Attorney in Chart?: Yes - validated most recent copy scanned in chart (See row information)  Review of Systems  Constitutional: Negative for malaise/fatigue and weight loss.  HENT: Negative for hearing loss and tinnitus.   Eyes: Negative for blurred vision and double vision.  Respiratory: Negative for cough, sputum production, shortness of breath and wheezing.   Cardiovascular: Negative for chest pain, palpitations, orthopnea, claudication, leg swelling and PND.  Gastrointestinal: Negative for abdominal pain, blood in stool, constipation, diarrhea, heartburn, melena, nausea and vomiting.  Genitourinary: Negative.   Musculoskeletal: Negative for falls, joint pain and myalgias.  Skin: Negative for rash.  Neurological: Negative for dizziness, tingling, sensory change, weakness and headaches.  Endo/Heme/Allergies: Negative for polydipsia.  Psychiatric/Behavioral: Negative.  Negative for depression, memory loss, substance abuse and suicidal ideas. The patient is not nervous/anxious and does not have insomnia.   All other systems reviewed and are negative.    Objective:     Today's Vitals   04/06/19 0959  BP: 126/78  Pulse: 96  Temp: (!) 97.5 F (36.4 C)  SpO2: 99%  Weight: 121 lb 3.2 oz (55 kg)  Height: 5\' 2"  (1.575 m)  PainSc: 8   PainLoc: Back   Body mass index is 22.17 kg/m.  General appearance: alert, no distress, WD/WN, female HEENT: normocephalic, sclerae anicteric, TMs pearly, nares patent, no discharge or erythema, pharynx normal Oral cavity: MMM, no lesions Neck: supple, no lymphadenopathy, no thyromegaly, no masses Heart: RRR, normal S1, S2, no murmurs Lungs: CTA bilaterally, no wheezes, rhonchi, or rales Abdomen: +bs, soft, non tender, non distended, no masses, no hepatomegaly, no splenomegaly Musculoskeletal: nontender, no swelling, no obvious deformity Extremities: no edema, no cyanosis, no  clubbing Pulses: 2+ symmetric, upper and lower extremities, normal cap refill Neurological: alert, oriented x 3, CN2-12 intact, strength normal upper extremities and lower extremities, sensation normal throughout, DTRs 2+ throughout, no cerebellar signs, gait normal Psychiatric: normal affect, behavior normal, pleasant   Medicare Attestation I have personally reviewed: The patient's medical and social history Their use of alcohol, tobacco or illicit drugs Their current medications and supplements The patient's functional ability including ADLs,fall risks, home safety risks, cognitive, and hearing and visual impairment Diet and physical activities Evidence for depression or mood disorders  The patient's weight, height, BMI, and visual acuity have been recorded in the chart.  I have made referrals, counseling, and provided education to the patient based on review of the above and I have provided the patient with a written personalized care plan for preventive services.     Dan Maker, NP   04/06/2019

## 2019-04-06 ENCOUNTER — Encounter: Payer: Self-pay | Admitting: Adult Health

## 2019-04-06 ENCOUNTER — Ambulatory Visit (INDEPENDENT_AMBULATORY_CARE_PROVIDER_SITE_OTHER): Payer: Medicare Other | Admitting: Adult Health

## 2019-04-06 ENCOUNTER — Other Ambulatory Visit: Payer: Self-pay

## 2019-04-06 VITALS — BP 126/78 | HR 96 | Temp 97.5°F | Ht 62.0 in | Wt 121.2 lb

## 2019-04-06 DIAGNOSIS — E559 Vitamin D deficiency, unspecified: Secondary | ICD-10-CM | POA: Diagnosis not present

## 2019-04-06 DIAGNOSIS — R7309 Other abnormal glucose: Secondary | ICD-10-CM

## 2019-04-06 DIAGNOSIS — R6889 Other general symptoms and signs: Secondary | ICD-10-CM

## 2019-04-06 DIAGNOSIS — Z0001 Encounter for general adult medical examination with abnormal findings: Secondary | ICD-10-CM

## 2019-04-06 DIAGNOSIS — Z6822 Body mass index (BMI) 22.0-22.9, adult: Secondary | ICD-10-CM | POA: Diagnosis not present

## 2019-04-06 DIAGNOSIS — I447 Left bundle-branch block, unspecified: Secondary | ICD-10-CM

## 2019-04-06 DIAGNOSIS — E782 Mixed hyperlipidemia: Secondary | ICD-10-CM | POA: Diagnosis not present

## 2019-04-06 DIAGNOSIS — Z0000 Encounter for general adult medical examination without abnormal findings: Secondary | ICD-10-CM

## 2019-04-06 DIAGNOSIS — Z87891 Personal history of nicotine dependence: Secondary | ICD-10-CM

## 2019-04-06 DIAGNOSIS — F419 Anxiety disorder, unspecified: Secondary | ICD-10-CM | POA: Diagnosis not present

## 2019-04-06 DIAGNOSIS — Z79899 Other long term (current) drug therapy: Secondary | ICD-10-CM | POA: Diagnosis not present

## 2019-04-06 DIAGNOSIS — R0989 Other specified symptoms and signs involving the circulatory and respiratory systems: Secondary | ICD-10-CM

## 2019-04-06 MED ORDER — LORAZEPAM 1 MG PO TABS: ORAL_TABLET | ORAL | 0 refills | Status: DC

## 2019-04-06 NOTE — Patient Instructions (Signed)
  Ms. Bach , Thank you for taking time to come for your Medicare Wellness Visit. I appreciate your ongoing commitment to your health goals. Please review the following plan we discussed and let me know if I can assist you in the future.   These are the goals we discussed: Goals    . Blood Pressure < 150/90       This is a list of the screening recommended for you and due dates:  Health Maintenance  Topic Date Due  . DEXA scan (bone density measurement)  07/16/1993  . Flu Shot  Discontinued  . Tetanus Vaccine  Discontinued  . Pneumonia vaccines  Discontinued

## 2019-07-05 ENCOUNTER — Other Ambulatory Visit: Payer: Self-pay

## 2019-07-05 NOTE — Telephone Encounter (Signed)
Lorazepam refill request. In que for your review.

## 2019-07-06 NOTE — Progress Notes (Signed)
History of Present Illness:      This very nice 85 y.o. WWF presents for 6 month follow up with HTN, HLD, Pre-Diabetes and Vitamin D Deficiency.       Patient is followed expectantly for mild labile  HTN  (1980) & BP has been controlled at home. Today's BP is at goal - 134/76. Patient has had no complaints of any cardiac type chest pain, palpitations, dyspnea / orthopnea / PND, dizziness, claudication, or dependent edema.      Hyperlipidemia is controlled with diet & Flaxseed supplements.  Last Lipids were not at goal, but not treated aggressively for age considerations:  Lab Results  Component Value Date   CHOL 184 12/30/2018   HDL 55 12/30/2018   LDLCALC 109 (H) 12/30/2018   TRIG 108 12/30/2018   CHOLHDL 3.3 12/30/2018        Also, the patient has history of PreDiabetes (A1c 5.7% / 2012) and has had no symptoms of reactive hypoglycemia, diabetic polys, paresthesias or visual blurring.  Last A1c was at goal:  Lab Results  Component Value Date   HGBA1C 5.5 12/30/2018        Further, the patient also has history of Vitamin D Deficiency and supplements vitamin D without any suspected side-effects. Last vitamin D was very low & not at goal:  Lab Results  Component Value Date   VD25OH 11 (L) 12/30/2018    Current Outpatient Medications on File Prior to Visit  Medication Sig  . cholecalciferol (VITAMIN D3) 25 MCG (1000 UT) tablet Take 3,000 Units by mouth daily.   Marland Kitchen gabapentin (NEURONTIN) 100 MG capsule Take 1 capsule (100 mg total) by mouth 3 (three) times daily. Take for pain.  Marland Kitchen latanoprost (XALATAN) 0.005 % ophthalmic solution    No current facility-administered medications on file prior to visit.    Allergies  Allergen Reactions  . Adhesive [Tape]   . Advil [Ibuprofen]   . Atenolol     PMHx:    Immunization History  Administered Date(s) Administered  . DTaP 05/06/2003  . Pneumococcal Polysaccharide-23 05/06/2003  . Zoster 07/16/2007   Past Surgical  History:  Procedure Laterality Date  . GLAUCOMA SURGERY  2013  . TONSILLECTOMY      FHx:    Reviewed / unchanged  SHx:    Reviewed / unchanged   Systems Review:  Constitutional: Denies fever, chills, wt changes, headaches, insomnia, fatigue, night sweats, change in appetite. Eyes: Denies redness, blurred vision, diplopia, discharge, itchy, watery eyes.  ENT: Denies discharge, congestion, post nasal drip, epistaxis, sore throat, earache, hearing loss, dental pain, tinnitus, vertigo, sinus pain, snoring.  CV: Denies chest pain, palpitations, irregular heartbeat, syncope, dyspnea, diaphoresis, orthopnea, PND, claudication or edema. Respiratory: denies cough, dyspnea, DOE, pleurisy, hoarseness, laryngitis, wheezing.  Gastrointestinal: Denies dysphagia, odynophagia, heartburn, reflux, water brash, abdominal pain or cramps, nausea, vomiting, bloating, diarrhea, constipation, hematemesis, melena, hematochezia  or hemorrhoids. Genitourinary: Denies dysuria, frequency, urgency, nocturia, hesitancy, discharge, hematuria or flank pain. Musculoskeletal: Denies arthralgias, myalgias, stiffness, jt. swelling, pain, limping or strain/sprain.  Skin: Denies pruritus, rash, hives, warts, acne, eczema or change in skin lesion(s). Neuro: No weakness, tremor, incoordination, spasms, paresthesia or pain. Psychiatric: Denies confusion, memory loss or sensory loss. Endo: Denies change in weight, skin or hair change.  Heme/Lymph: No excessive bleeding, bruising or enlarged lymph nodes.  Physical Exam  BP 134/76   Pulse 92   Temp 97.8 F (36.6 C)   Resp 16   Ht  5\' 2"  (1.575 m)   Wt 121 lb 6.4 oz (55.1 kg)   BMI 22.20 kg/m   Appears  well nourished, well groomed  and in no distress.  Eyes: PERRLA, EOMs, conjunctiva no swelling or erythema. Sinuses: No frontal/maxillary tenderness ENT/Mouth: EAC's clear, TM's nl w/o erythema, bulging. Nares clear w/o erythema, swelling, exudates. Oropharynx clear  without erythema or exudates. Oral hygiene is good. Tongue normal, non obstructing. Hearing intact.  Neck: Supple. Thyroid not palpable. Car 2+/2+ without bruits, nodes or JVD. Chest: Respirations nl with BS clear & equal w/o rales, rhonchi, wheezing or stridor.  Cor: Heart sounds normal w/ regular rate and rhythm without sig. murmurs, gallops, clicks or rubs. Peripheral pulses normal and equal  without edema.  Abdomen: Soft & bowel sounds normal. Non-tender w/o guarding, rebound, hernias, masses or organomegaly.  Lymphatics: Unremarkable.  Musculoskeletal: Full ROM all peripheral extremities, joint stability, 5/5 strength and normal gait.  Skin: Warm, dry without exposed rashes, lesions or ecchymosis apparent.  Neuro: Cranial nerves intact, reflexes equal bilaterally. Sensory-motor testing grossly intact. Tendon reflexes grossly intact.  Pysch: Alert & oriented x 3.  Insight and judgement nl & appropriate. No ideations.  Assessment and Plan:  1. Labile hypertension  - Continue medication, monitor blood pressure at home.  - Continue DASH diet.  Reminder to go to the ER if any CP,  SOB, nausea, dizziness, severe HA, changes vision/speech.  - CBC with Diff - COMPLETE METABOLIC PANEL WITH GFR - Magnesium - TSH  2. Hyperlipidemia, mixed  - Continue diet/meds, exercise,& lifestyle modifications.  - Continue monitor periodic cholesterol/liver & renal functions   - TSH  3. Abnormal glucose  - Continue diet, exercise  - Lifestyle modifications.  - Monitor appropriate labs.  - Hemoglobin A1c ([facility])  4. Vitamin D deficiency  - Continue supplementation.  - Vitamin D (25 hydroxy)  5. Medication management  - CBC with Diff - COMPLETE METABOLIC PANEL WITH GFR - Magnesium - TSH - Hemoglobin A1c ([facility]) - Vitamin D (25 hydroxy)        Discussed  regular exercise, BP monitoring, weight control to achieve/maintain BMI less than 25 and discussed med and SE's. Recommended labs  to assess and monitor clinical status with further disposition pending results of labs.  I discussed the assessment and treatment plan with the patient. The patient was provided an opportunity to ask questions and all were answered. The patient agreed with the plan and demonstrated an understanding of the instructions.  I provided over 30 minutes of exam, counseling, chart review and  complex critical decision making.  Kirtland Bouchard, MD

## 2019-07-07 ENCOUNTER — Other Ambulatory Visit: Payer: Self-pay

## 2019-07-07 ENCOUNTER — Ambulatory Visit (INDEPENDENT_AMBULATORY_CARE_PROVIDER_SITE_OTHER): Payer: Medicare Other | Admitting: Internal Medicine

## 2019-07-07 ENCOUNTER — Encounter: Payer: Self-pay | Admitting: Internal Medicine

## 2019-07-07 VITALS — BP 134/76 | HR 92 | Temp 97.8°F | Resp 16 | Ht 62.0 in | Wt 121.4 lb

## 2019-07-07 DIAGNOSIS — E559 Vitamin D deficiency, unspecified: Secondary | ICD-10-CM | POA: Diagnosis not present

## 2019-07-07 DIAGNOSIS — R7309 Other abnormal glucose: Secondary | ICD-10-CM

## 2019-07-07 DIAGNOSIS — R0989 Other specified symptoms and signs involving the circulatory and respiratory systems: Secondary | ICD-10-CM

## 2019-07-07 DIAGNOSIS — Z79899 Other long term (current) drug therapy: Secondary | ICD-10-CM

## 2019-07-07 DIAGNOSIS — E782 Mixed hyperlipidemia: Secondary | ICD-10-CM | POA: Diagnosis not present

## 2019-07-07 MED ORDER — LORAZEPAM 1 MG PO TABS: ORAL_TABLET | ORAL | 0 refills | Status: DC

## 2019-07-07 NOTE — Patient Instructions (Signed)

## 2019-07-08 LAB — COMPLETE METABOLIC PANEL WITH GFR
AG Ratio: 1.8 (calc) (ref 1.0–2.5)
ALT: 12 U/L (ref 6–29)
AST: 14 U/L (ref 10–35)
Albumin: 4.2 g/dL (ref 3.6–5.1)
Alkaline phosphatase (APISO): 42 U/L (ref 37–153)
BUN/Creatinine Ratio: 21 (calc) (ref 6–22)
BUN: 19 mg/dL (ref 7–25)
CO2: 27 mmol/L (ref 20–32)
Calcium: 10.1 mg/dL (ref 8.6–10.4)
Chloride: 103 mmol/L (ref 98–110)
Creat: 0.9 mg/dL — ABNORMAL HIGH (ref 0.60–0.88)
GFR, Est African American: 65 mL/min/{1.73_m2} (ref >=60)
GFR, Est Non African American: 56 mL/min/{1.73_m2} — ABNORMAL LOW (ref >=60)
Globulin: 2.3 g/dL (calc) (ref 1.9–3.7)
Glucose, Bld: 119 mg/dL — ABNORMAL HIGH (ref 65–99)
Potassium: 4.1 mmol/L (ref 3.5–5.3)
Sodium: 138 mmol/L (ref 135–146)
Total Bilirubin: 0.6 mg/dL (ref 0.2–1.2)
Total Protein: 6.5 g/dL (ref 6.1–8.1)

## 2019-07-08 LAB — HEMOGLOBIN A1C
Hgb A1c MFr Bld: 5.7 % of total Hgb — ABNORMAL HIGH (ref <5.7)
Mean Plasma Glucose: 117 (calc)
eAG (mmol/L): 6.5 (calc)

## 2019-07-08 LAB — CBC WITH DIFFERENTIAL/PLATELET
Absolute Monocytes: 554 cells/uL (ref 200–950)
Basophils Absolute: 72 cells/uL (ref 0–200)
Basophils Relative: 1 %
Eosinophils Absolute: 29 cells/uL (ref 15–500)
Eosinophils Relative: 0.4 %
HCT: 43.5 % (ref 35.0–45.0)
Hemoglobin: 14.6 g/dL (ref 11.7–15.5)
Lymphs Abs: 850 cells/uL (ref 850–3900)
MCH: 30 pg (ref 27.0–33.0)
MCHC: 33.6 g/dL (ref 32.0–36.0)
MCV: 89.3 fL (ref 80.0–100.0)
MPV: 10.1 fL (ref 7.5–12.5)
Monocytes Relative: 7.7 %
Neutro Abs: 5695 cells/uL (ref 1500–7800)
Neutrophils Relative %: 79.1 %
Platelets: 374 10*3/uL (ref 140–400)
RBC: 4.87 10*6/uL (ref 3.80–5.10)
RDW: 13.7 % (ref 11.0–15.0)
Total Lymphocyte: 11.8 %
WBC: 7.2 10*3/uL (ref 3.8–10.8)

## 2019-07-08 LAB — TSH: TSH: 1.84 mIU/L (ref 0.40–4.50)

## 2019-07-08 LAB — VITAMIN D 25 HYDROXY (VIT D DEFICIENCY, FRACTURES): Vit D, 25-Hydroxy: 39 ng/mL (ref 30–100)

## 2019-07-08 LAB — MAGNESIUM: Magnesium: 2.3 mg/dL (ref 1.5–2.5)

## 2019-08-05 DIAGNOSIS — H401431 Capsular glaucoma with pseudoexfoliation of lens, bilateral, mild stage: Secondary | ICD-10-CM | POA: Diagnosis not present

## 2019-10-04 ENCOUNTER — Other Ambulatory Visit: Payer: Self-pay | Admitting: Internal Medicine

## 2019-10-07 ENCOUNTER — Encounter: Payer: Self-pay | Admitting: Adult Health Nurse Practitioner

## 2019-10-07 ENCOUNTER — Ambulatory Visit (INDEPENDENT_AMBULATORY_CARE_PROVIDER_SITE_OTHER): Payer: Medicare Other | Admitting: Adult Health Nurse Practitioner

## 2019-10-07 ENCOUNTER — Other Ambulatory Visit: Payer: Self-pay

## 2019-10-07 VITALS — BP 130/80 | HR 104 | Temp 97.3°F | Ht 62.0 in | Wt 122.0 lb

## 2019-10-07 DIAGNOSIS — E559 Vitamin D deficiency, unspecified: Secondary | ICD-10-CM

## 2019-10-07 DIAGNOSIS — Z79899 Other long term (current) drug therapy: Secondary | ICD-10-CM | POA: Diagnosis not present

## 2019-10-07 DIAGNOSIS — E782 Mixed hyperlipidemia: Secondary | ICD-10-CM | POA: Diagnosis not present

## 2019-10-07 DIAGNOSIS — Z6822 Body mass index (BMI) 22.0-22.9, adult: Secondary | ICD-10-CM | POA: Diagnosis not present

## 2019-10-07 DIAGNOSIS — R0989 Other specified symptoms and signs involving the circulatory and respiratory systems: Secondary | ICD-10-CM

## 2019-10-07 DIAGNOSIS — R7309 Other abnormal glucose: Secondary | ICD-10-CM

## 2019-10-07 DIAGNOSIS — I447 Left bundle-branch block, unspecified: Secondary | ICD-10-CM | POA: Diagnosis not present

## 2019-10-07 DIAGNOSIS — M25531 Pain in right wrist: Secondary | ICD-10-CM | POA: Diagnosis not present

## 2019-10-07 DIAGNOSIS — F419 Anxiety disorder, unspecified: Secondary | ICD-10-CM | POA: Diagnosis not present

## 2019-10-07 NOTE — Progress Notes (Signed)
3 MONTH FOLLOW UP  Assessment:    Leslie [name] was seen today for follow-up.  Diagnoses and all orders for this visit:  Labile hypertension Controlled today - continue medications, DASH diet, exercise and monitor at home. Call if greater than 130/80.  -     CBC with Differential/Platelet -     COMPLETE METABOLIC PANEL WITH GFR  Vitamin D deficiency Vitamin D Def/ osteoporosis prevention Continue supplementation Check vitamin D level -     VITAMIN D 25 Hydroxy (Vit-D Deficiency, Fractures)  Medication management Continued  Hyperlipidemia, mixed Cholesterol No medication at this time Continue low cholesterol diet and exercise.  Check lipid panel.   Abnormal glucose - continue medications, stress management techniques discussed, increase water, good sleep hygiene discussed, increase exercise, and increase veggies.   LBBB (left bundle branch block) Avoid rate controlling drugs, monitor, annual EKG  BMI 22.0-22.9 Continue to recommend diet heavy in fruits and veggies and low in animal meats, cheeses, and dairy products, appropriate calorie intake Discuss exercise recommendations routinely Continue to monitor weight at each visit   Over 30 minutes of face to face interview, exam, counseling, chart review and critical decision making was performed. Future Appointments  Date Time Provider Department Center  02/02/2020 10:00 AM Lucky Cowboy, MD [facility] None  04/12/2020 10:00 AM Judd Gaudier, NP [facility] None     HPI:  Leslie [name] is a 86 y.o. female who presents for 3 month follow up on labile HTN, HLD, vitamin D deficiency, anxiety and weight.  She is currently prescribed ativan 1 mg for anxiety; she reports she typically takes at night for sleep if needed. Hasn't needed during the day.   She has also been prescribed tramadol for aches and pains in the past; she has been transitioned to tylenol and gabapentin due to opioid prescription but she reports she never  takes, has been going to the chiropractor every two weeks and has been able to tolerate residual pain.   BMI is Body mass index is 22.31 kg/m., she has been working on diet and exercise. Wt Readings from Last 3 Encounters:  10/07/19 122 lb (55.3 kg)  07/07/19 121 lb 6.4 oz (55.1 kg)  04/06/19 121 lb 3.2 oz (55 kg)    Her blood pressure has been controlled at home, today their BP is BP: 130/80 She does workout. She denies chest pain, shortness of breath, dizziness.   She is not on cholesterol medication and denies myalgias. Her cholesterol is at goal. The cholesterol last visit was:   Lab Results  Component Value Date   CHOL 184 12/30/2018   HDL 55 12/30/2018   LDLCALC 109 (H) 12/30/2018   TRIG 108 12/30/2018   CHOLHDL 3.3 12/30/2018    She has been working on diet and exercise for hx prediabetes (rarely eats bread, does have a sweet tooth), and denies foot ulcerations, increased appetite, nausea, paresthesia of the feet, polydipsia, polyuria, visual disturbances, vomiting and weight loss. Last A1C in the office was:  Lab Results  Component Value Date   HGBA1C 5.7 (H) 07/07/2019   Last GFR: Lab Results  Component Value Date   GFRNONAA 56 (L) 07/07/2019   Patient is on Vitamin D supplement, newly taking 3000 IU since last visit   Lab Results  Component Value Date   VD25OH 39 07/07/2019      Medication Review: Current Outpatient Medications on File Prior to Visit  Medication Sig Dispense Refill  . cholecalciferol (VITAMIN D3) 25 MCG (1000 UT) tablet  Take 3,000 Units by mouth daily.     Marland Kitchen latanoprost (XALATAN) 0.005 % ophthalmic solution     . LORazepam (ATIVAN) 1 MG tablet Take 1/2 to 1 tablet 2 to 3 x / day  ONLY if needed for Anxiety Attack & please try to limit to 5 days /week to avoid Addiction & Dementia 90 tablet 0  . gabapentin (NEURONTIN) 100 MG capsule Take 1 capsule (100 mg total) by mouth 3 (three) times daily. Take for pain. (Patient not taking: Reported on  10/07/2019) 90 capsule 1   No current facility-administered medications on file prior to visit.    Allergies  Allergen Reactions  . Adhesive [Tape]   . Advil [Ibuprofen]   . Atenolol     Current Problems (verified) Patient Active Problem List   Diagnosis Date Noted  . Labile hypertension 06/16/2018  . FHx: heart disease 06/16/2018  . Former smoker 06/16/2018  . Abnormal glucose 06/16/2018  . Anxiety 03/04/2018  . Medication management 04/08/2014  . Vitamin D deficiency 07/01/2013  . Hyperlipidemia, mixed 07/01/2013  . LBBB (left bundle branch block) 02/20/2010    Screening Tests Immunization History  Administered Date(s) Administered  . DTaP 05/06/2003  . Pneumococcal Polysaccharide-23 05/06/2003  . Zoster 07/16/2007   Preventative care: Last colonoscopy: never, declines, ordered hemoccult at CPE, denies further  Last mammogram: 2012  Last CXR: 2013, former smoker quit in 1981, declines repeat CXR today   Prior vaccinations: TD or Tdap: 2004, declines futher due to cost  Influenza: Declines Pneumococcal: 2004 Prevnar13: Declines Shingles/Zostavax: 2009  Names of Other Physician/Practitioners you currently use: 1. [facility]- here for primary care 2. Dr. Randon Goldsmith, eye doctor, last visit 2020, sees q63m, glaucoma well controlled with drops 3. Dr.  Melynda Ripple, dentist, last visit 2020- goes annually   Patient Care Team: Lucky Cowboy, MD as PCP - General (Internal Medicine)  SURGICAL HISTORY She  has a past surgical history that includes Glaucoma surgery (2013) and Tonsillectomy. FAMILY HISTORY Her family history includes Diabetes in her father; Heart attack in her brother, brother, and father; Heart disease in her brother and brother; Hypertension in her mother; Stroke in her mother. SOCIAL HISTORY She  reports that she quit smoking about 40 years ago. She has never used smokeless tobacco. She reports that she does not drink  alcohol or use drugs.   Review of Systems  Constitutional: Negative for malaise/fatigue and weight loss.  HENT: Negative for hearing loss and tinnitus.   Eyes: Negative for blurred vision and double vision.  Respiratory: Negative for cough, sputum production, shortness of breath and wheezing.   Cardiovascular: Negative for chest pain, palpitations, orthopnea, claudication, leg swelling and PND.  Gastrointestinal: Negative for abdominal pain, blood in stool, constipation, diarrhea, heartburn, melena, nausea and vomiting.  Genitourinary: Negative.   Musculoskeletal: Negative for falls, joint pain and myalgias.  Skin: Negative for rash.  Neurological: Negative for dizziness, tingling, sensory change, weakness and headaches.  Endo/Heme/Allergies: Negative for polydipsia.  Psychiatric/Behavioral: Negative.  Negative for depression, memory loss, substance abuse and suicidal ideas. The patient is not nervous/anxious and does not have insomnia.   All other systems reviewed and are negative.    Objective:     Today's Vitals   10/07/19 0946  BP: 130/80  Pulse: (!) 104  Temp: (!) 97.3 F (36.3 C)  SpO2: 96%  Weight: 122 lb (55.3 kg)  Height: 5\' 2"  (1.575 m)   Body mass index is 22.31 kg/m.  General appearance:  alert, no distress, WD/WN, female HEENT: normocephalic, sclerae anicteric, TMs pearly, nares patent, no discharge or erythema, pharynx normal Oral cavity: MMM, no lesions Neck: supple, no lymphadenopathy, no thyromegaly, no masses Heart: RRR, normal S1, S2, no murmurs Lungs: CTA bilaterally, no wheezes, rhonchi, or rales Abdomen: +bs, soft, non tender, non distended, no masses, no hepatomegaly, no splenomegaly Musculoskeletal: nontender, no swelling, no obvious deformity Extremities: no edema, no cyanosis, no clubbing Pulses: 2+ symmetric, upper and lower extremities, normal cap refill Neurological: alert, oriented x 3, CN2-12 intact, strength normal upper extremities and  lower extremities, sensation normal throughout, DTRs 2+ throughout, no cerebellar signs, gait normal Psychiatric: normal affect, behavior normal, pleasant     Garnet Sierras, NP   10/07/2019

## 2019-10-07 NOTE — Patient Instructions (Addendum)
Today your heart rate was elevated to 104.    Normal heart rate is 70-90.  Please check your heart rate at home.  Be sure you are sitting for 70mn prior to checking blood pressure.  Write this number down with the date and time.   When we contact you Monday with your lab results please let [place] what numbers you are getting at home for your heart rate.   Keep on watch on your right wrist.  If there is an increase in pain or swelling please contact the office we soul do an X-ray.   You may take tylenol acetaminophen (Tylenol) 1,0049mthree times a day if needed.   Vit D  & Vit C 1,000 mg   are recommended to help protect  against the Covid-19 and other Corona viruses.    Also it's recommended  to take  Zinc 50 mg  to help  protect against the Covid-19   and best place to get  is also on [employer]  and don't pay more than 6-8 cents /pill !  ================================ Coronavirus (COVID-19) Are you at risk?  Are you at risk for the Coronavirus (COVID-19)?  To be considered HIGH RISK for Coronavirus (COVID-19), you have to meet the following criteria:  . Traveled to [place] have fever, cough, and shortness of breath within the last 2 weeks of travel OR . Been in close contact with a person diagnosed with COVID-19 within the last 2 weeks and have  . fever, cough,and shortness of breath .  . IF YOU DO NOT MEET THESE CRITERIA, YOU ARE CONSIDERED LOW RISK FOR COVID-19.  What to do if you are HIGH RISK for COVID-19?  . Marland Kitchenf you are having a medical emergency, call 911. . Seek medical care right away. Before you go to a doctor's office, urgent care or emergency department, .  call ahead and tell them about your recent travel, contact with someone diagnosed with COVID-19  .  and your symptoms.  . You should receive instructions from your physician's office  regarding next steps of care.  . When you arrive at healthcare provider, tell the healthcare staff immediately you have returned from  . visiting [place] traveled [place] the last two weeks or you have been in close contact with a person diagnosed with  . COVID-19 in the last 2 weeks.   . Tell the health care staff about your symptoms: fever, cough and shortness of breath. . After you have been seen by a medical provider, you will be either: o Tested for (COVID-19) and discharged home on quarantine except to seek medical care if  o symptoms worsen, and asked to  - Stay home and avoid contact with others until you get your results (4-5 days)  - Avoid travel on public transportation if possible (such as bus, train, or airplane) or o Sent to the Emergency Department by EMS for evaluation, COVID-19 testing  and  o possible admission depending on your condition and test results.  What to do if you are LOW RISK for COVID-19?  Reduce your risk of any infection by using the same precautions used for avoiding the common cold or flu:  . Marland Kitchenash your hands often with soap and  warm water for at least 20 seconds.  If soap and water are not readily available,  . use an alcohol-based hand sanitizer with at least 60% alcohol.  . If coughing or sneezing, cover your mouth and nose by coughing or sneezing into the elbow areas of your shirt or coat, .  into a tissue or into your sleeve (not your hands). . Avoid shaking hands with others and consider head nods or verbal greetings only. . Avoid touching your eyes, nose, or mouth with unwashed hands.  . Avoid close contact with people who are sick. . Avoid places or events with large numbers of people in one location, like concerts or sporting events. . Carefully consider travel plans you have or are making. . If you are planning any travel outside or inside  the [place], visit the CDC's Travelers' Health webpage for the latest health notices. . If you have some symptoms but not all symptoms, continue to monitor at home and seek medical attention  . if your symptoms worsen. . If you are having a medical emergency, call 911.   . >>>>>>>>>>>>>>>>>>>>>>>>>>>>>>>>> . We Do NOT Approve of  [facility], [employer]  Our Patients  To Do Home Visits & We Do NOT Approve of [employer] > > > > > > > > > > > > > > > > > > > > > > > > > > > > > > > > > > > > > > >  Preventive Care for Adults  A healthy lifestyle and preventive care can promote health and wellness. Preventive health guidelines for women include the following key practices.  A routine yearly physical is a good way to check with your health care provider about your health and preventive screening. It is a chance to share any concerns and updates on your health and to receive a thorough exam.  Visit your dentist for a routine exam and preventive care every 6 months. Brush your teeth twice a day and floss once a day. Good oral hygiene prevents tooth decay and gum disease.  The frequency of eye exams is based on your age, health, family medical history, use of contact lenses, and other factors. Follow your health care provider's recommendations for frequency of eye exams.  Eat a healthy diet. Foods like vegetables, fruits, whole grains, low-fat dairy products, and lean protein foods contain the nutrients you need without too many calories. Decrease your intake of foods high in solid fats, added sugars, and salt. Eat the right amount of calories for you. Get information about a proper diet from your health care provider, if necessary.  Regular physical exercise is one of the most important things you can do for your health. Most adults should get at least 150 minutes of moderate-intensity exercise (any activity that increases your heart rate and causes you to sweat) each week.  In addition, most adults need muscle-strengthening exercises on 2 or more days a week.  Maintain a healthy weight. The body mass index (BMI) is a screening tool to identify possible weight problems. It provides an estimate of body fat based on height and weight. Your health care provider can find your BMI and can help you achieve or maintain a healthy weight. For adults 20 years and older:  A BMI below 18.5 is considered underweight.  A BMI of 18.5 to 24.9 is normal.  A BMI of 25 to 29.9 is considered overweight.  A BMI of 30 and above is considered obese.  Maintain normal blood lipids and cholesterol levels by exercising and minimizing your intake of saturated fat. Eat a balanced diet with plenty of fruit and vegetables. If your lipid or cholesterol levels are high, you are over 50, or you are at high risk for heart disease, you may need your cholesterol levels checked more frequently. Ongoing high lipid and cholesterol levels should be treated with medicines if diet and exercise are not working.  If you smoke, find out from your health care provider how to quit. If you do not use tobacco, do not start.  Lung cancer screening is recommended for adults aged 41-80 years who are at high risk for developing lung cancer because of a history of smoking. A yearly low-dose CT scan of the lungs is recommended for people who have at least a 30-pack-year history of smoking and are a current smoker or have quit within the past 15 years. A pack year of smoking is smoking an average of 1 pack of cigarettes a day for 1 year (for example: 1 pack a day for 30 years or 2 packs a day for 15 years). Yearly screening should continue until the smoker has stopped smoking for at least 15 years. Yearly screening should be stopped for people who develop a health problem that would prevent them from having lung cancer treatment.  Avoid use of street drugs. Do not share needles with anyone. Ask for help if you need support  or instructions about stopping the use of drugs.  High blood pressure causes heart disease and increases the risk of stroke.  Ongoing high blood pressure should be treated with medicines if weight loss and exercise do not work.  If you are 65-72 years old, ask your health care provider if you should take aspirin to prevent strokes.  Diabetes screening involves taking a blood sample to check your fasting blood sugar level. This should be done once every 3 years, after age 44, if you are within normal weight and without risk factors for diabetes. Testing should be considered at a younger age or be carried out more frequently if you are overweight and have at least 1 risk factor for diabetes.  Breast cancer screening is essential preventive care for women. You should practice "breast self-awareness." This means understanding the normal appearance and feel of your breasts and may include breast self-examination. Any changes detected, no matter how small, should be reported to a health care provider. Women in their 73s and 30s should have a clinical breast exam (CBE) by a health care provider as part of a regular health exam every 1 to 3 years. After age 64, women should have a CBE every year. Starting at age 73, women should consider having a mammogram (breast X-ray test) every year. Women who have a family history of breast cancer should talk to their health care provider about genetic screening. Women at a high risk of breast cancer should talk to their health care providers about having an MRI and a mammogram every year.  Breast cancer gene (BRCA)-related cancer risk assessment is recommended for women who have family members with BRCA-related cancers. BRCA-related cancers include breast, ovarian, tubal, and peritoneal cancers. Having family members with these cancers may be associated with an increased risk for harmful changes (mutations) in the breast cancer genes BRCA1 and BRCA2. Results of the  assessment will determine the need for genetic counseling and BRCA1 and BRCA2 testing.  Routine pelvic exams to screen for cancer are no longer recommended for  nonpregnant women who are considered low risk for cancer of the pelvic organs (ovaries, uterus, and vagina) and who do not have symptoms. Ask your health care provider if a screening pelvic exam is right for you.  If you have had past treatment for cervical cancer or a condition that could lead to cancer, you need Pap tests and screening for cancer for at least 20 years after your treatment. If Pap tests have been discontinued, your risk factors (such as having a new sexual partner) need to be reassessed to determine if screening should be resumed. Some women have medical problems that increase the chance of getting cervical cancer. In these cases, your health care provider may recommend more frequent screening and Pap tests.    Colorectal cancer can be detected and often prevented. Most routine colorectal cancer screening begins at the age of 44 years and continues through age 77 years. However, your health care provider may recommend screening at an earlier age if you have risk factors for colon cancer. On a yearly basis, your health care provider may provide home test kits to check for hidden blood in the stool. Use of a small camera at the end of a tube, to directly examine the colon (sigmoidoscopy or colonoscopy), can detect the earliest forms of colorectal cancer. Talk to your health care provider about this at age 78, when routine screening begins.  Direct exam of the colon should be repeated every 5-10 years through age 38 years, unless early forms of pre-cancerous polyps or small growths are found.  Osteoporosis is a disease in which the bones lose minerals and strength with aging. This can result in serious bone fractures or breaks. The risk of osteoporosis can be identified using a bone density scan. Women ages 34 years and over and women  at risk for fractures or osteoporosis should discuss screening with their health care providers. Ask your health care provider whether you should take a calcium supplement or vitamin D to reduce the rate of osteoporosis.  Menopause can be associated with physical symptoms and risks. Hormone replacement therapy is available to decrease symptoms and risks. You should talk to your health care provider about whether hormone replacement therapy is right for you.  Use sunscreen. Apply sunscreen liberally and repeatedly throughout the day. You should seek shade when your shadow is shorter than you. Protect yourself by wearing long sleeves, pants, a wide-brimmed hat, and sunglasses year round, whenever you are outdoors.  Once a month, do a whole body skin exam, using a mirror to look at the skin on your back. Tell your health care provider of new moles, moles that have irregular borders, moles that are larger than a pencil eraser, or moles that have changed in shape or color.  Stay current with required vaccines (immunizations).  Influenza vaccine. All adults should be immunized every year.  Tetanus, diphtheria, and acellular pertussis (Td, Tdap) vaccine. Pregnant women should receive 1 dose of Tdap vaccine during each pregnancy. The dose should be obtained regardless of the length of time since the last dose. Immunization is preferred during the 27th-36th week of gestation. An adult who has not previously received Tdap or who does not know her vaccine status should receive 1 dose of Tdap. This initial dose should be followed by tetanus and diphtheria toxoids (Td) booster doses every 10 years. Adults with an unknown or incomplete history of completing a 3-dose immunization series with Td-containing vaccines should begin or complete a primary immunization series including a  Tdap dose. Adults should receive a Td booster every 10 years.    Zoster vaccine. One dose is recommended for adults aged 68 years or  older unless certain conditions are present.    Pneumococcal 13-valent conjugate (PCV13) vaccine. When indicated, a person who is uncertain of her immunization history and has no record of immunization should receive the PCV13 vaccine. An adult aged 1 years or older who has certain medical conditions and has not been previously immunized should receive 1 dose of PCV13 vaccine. This PCV13 should be followed with a dose of pneumococcal polysaccharide (PPSV23) vaccine. The PPSV23 vaccine dose should be obtained at least 1 or more year(s) after the dose of PCV13 vaccine. An adult aged 23 years or older who has certain medical conditions and previously received 1 or more doses of PPSV23 vaccine should receive 1 dose of PCV13. The PCV13 vaccine dose should be obtained 1 or more years after the last PPSV23 vaccine dose.    Pneumococcal polysaccharide (PPSV23) vaccine. When PCV13 is also indicated, PCV13 should be obtained first. All adults aged 54 years and older should be immunized. An adult younger than age 43 years who has certain medical conditions should be immunized. Any person who resides in a nursing home or long-term care facility should be immunized. An adult smoker should be immunized. People with an immunocompromised condition and certain other conditions should receive both PCV13 and PPSV23 vaccines. People with human immunodeficiency virus (HIV) infection should be immunized as soon as possible after diagnosis. Immunization during chemotherapy or radiation therapy should be avoided. Routine use of PPSV23 vaccine is not recommended for American Indians, [place] Natives, or people younger than 65 years unless there are medical conditions that require PPSV23 vaccine. When indicated, people who have unknown immunization and have no record of immunization should receive PPSV23 vaccine. One-time revaccination 5 years after the first dose of PPSV23 is recommended for people aged 19-64 years who have chronic  kidney failure, nephrotic syndrome, asplenia, or immunocompromised conditions. People who received 1-2 doses of PPSV23 before age 1 years should receive another dose of PPSV23 vaccine at age 65 years or later if at least 5 years have passed since the previous dose. Doses of PPSV23 are not needed for people immunized with PPSV23 at or after age 59 years.   Preventive Services / Frequency  Ages 89 years and over  Blood pressure check.  Lipid and cholesterol check.  Lung cancer screening. / Every year if you are aged 60-80 years and have a 30-pack-year history of smoking and currently smoke or have quit within the past 15 years. Yearly screening is stopped once you have quit smoking for at least 15 years or develop a health problem that would prevent you from having lung cancer treatment.  Clinical breast exam.** / Every year after age 60 years.   BRCA-related cancer risk assessment.** / For women who have family members with a BRCA-related cancer (breast, ovarian, tubal, or peritoneal cancers).  Mammogram.** / Every year beginning at age 55 years and continuing for as long as you are in good health. Consult with your health care provider.  Pap test.** / Every 3 years starting at age 61 years through age 85 or 31 years with 3 consecutive normal Pap tests. Testing can be stopped between 65 and 70 years with 3 consecutive normal Pap tests and no abnormal Pap or HPV tests in the past 10 years.  Fecal occult blood test (FOBT) of stool. / Every year beginning at  age 55 years and continuing until age 26 years. You may not need to do this test if you get a colonoscopy every 10 years.  Flexible sigmoidoscopy or colonoscopy.** / Every 5 years for a flexible sigmoidoscopy or every 10 years for a colonoscopy beginning at age 38 years and continuing until age 95 years.  Hepatitis C blood test.** / For all people born from [birth year] through 1963 and any individual with known risks for hepatitis  C.  Osteoporosis screening.** / A one-time screening for women ages 98 years and over and women at risk for fractures or osteoporosis.  Skin self-exam. / Monthly.  Influenza vaccine. / Every year.  Tetanus, diphtheria, and acellular pertussis (Tdap/Td) vaccine.** / 1 dose of Td every 10 years.  Zoster vaccine.** / 1 dose for adults aged 50 years or older.  Pneumococcal 13-valent conjugate (PCV13) vaccine.** / Consult your health care provider.  Pneumococcal polysaccharide (PPSV23) vaccine.** / 1 dose for all adults aged 64 years and older. Screening for abdominal aortic aneurysm (AAA)  by ultrasound is recommended for people who have history of high blood pressure or who are current or former smokers. ++++++++++++++++++++ Recommend Adult Low Dose Aspirin or  coated  Aspirin 81 mg daily  To reduce risk of Colon Cancer 40 %,  Skin Cancer 26 % ,  Melanoma 46%  and  Pancreatic cancer 60% ++++++++++++++++++++ Vitamin D goal  is between 70-100.  Please make sure that you are taking your Vitamin D as directed.  It is very important as a natural anti-inflammatory  helping hair, skin, and nails, as well as reducing stroke and heart attack risk.  It helps your bones and helps with mood. It also decreases numerous cancer risks so please take it as directed.  Low Vit D is associated with a 200-300% higher risk for CANCER  and 200-300% higher risk for HEART   ATTACK  &  STROKE.   .....................................Marland Kitchen It is also associated with higher death rate at younger ages,  autoimmune diseases like Rheumatoid arthritis, Lupus, Multiple Sclerosis.    Also many other serious conditions, like depression, Alzheimer's Dementia, infertility, muscle aches, fatigue, fibromyalgia - just to name a few. ++++++++++++++++++ Recommend the book "The END of DIETING" by Dr Excell Seltzer  & the book "The END of DIABETES " by Dr Excell Seltzer At [email or web address] - get book & Audio CD's    Being diabetic has  a  300% increased risk for heart attack, stroke, cancer, and alzheimer- type vascular dementia. It is very important that you work harder with diet by avoiding all foods that are white. Avoid white rice (brown & wild rice is OK), white potatoes (sweetpotatoes in moderation is OK), White bread or wheat bread or anything made out of white flour like bagels, donuts, rolls, buns, biscuits, cakes, pastries, cookies, pizza crust, and pasta (made from white flour & egg whites) - vegetarian pasta or spinach or wheat pasta is OK. Multigrain breads like Arnold's or [employer], or multigrain sandwich thins or flatbreads.  Diet, exercise and weight loss can reverse and cure diabetes in the early stages.  Diet, exercise and weight loss is very important in the control and prevention of complications of diabetes which affects every system in your body, ie. Brain - dementia/stroke, eyes - glaucoma/blindness, heart - heart attack/heart failure, kidneys - dialysis, stomach - gastric paralysis, intestines - malabsorption, nerves - severe painful neuritis, circulation - gangrene & loss of a leg(s), and finally cancer and Alzheimers.  I recommend avoid fried & greasy foods,  sweets/candy, white rice (brown or wild rice or Quinoa is OK), white potatoes (sweet potatoes are OK) - anything made from white flour - bagels, doughnuts, rolls, buns, biscuits,white and wheat breads, pizza crust and traditional pasta made of white flour & egg white(vegetarian pasta or spinach or wheat pasta is OK).  Multi-grain bread is OK - like multi-grain flat bread or sandwich thins. Avoid alcohol in excess. Exercise is also important.    Eat all the vegetables you want - avoid meat, especially red meat and dairy - especially cheese.  Cheese is the most concentrated form of trans-fats which is the worst thing to clog up our arteries. Veggie cheese is OK which can be found in the fresh produce section at [employer] or [employer] or  [employer]  +++++++++++++++++++ DASH Eating Plan  DASH stands for "Dietary Approaches to Stop Hypertension."   The DASH eating plan is a healthy eating plan that has been shown to reduce high blood pressure (hypertension). Additional health benefits may include reducing the risk of type 2 diabetes mellitus, heart disease, and stroke. The DASH eating plan may also help with weight loss. WHAT DO I NEED TO KNOW ABOUT THE DASH EATING PLAN? For the DASH eating plan, you will follow these general guidelines:  Choose foods with a percent daily value for sodium of less than 5% (as listed on the food label).  Use salt-free seasonings or herbs instead of table salt or sea salt.  Check with your health care provider or pharmacist before using salt substitutes.  Eat lower-sodium products, often labeled as "lower sodium" or "no salt added."  Eat fresh foods.  Eat more vegetables, fruits, and low-fat dairy products.  Choose whole grains. Look for the word "whole" as the first word in the ingredient list.  Choose fish   Limit sweets, desserts, sugars, and sugary drinks.  Choose heart-healthy fats.  Eat veggie cheese   Eat more home-cooked food and less restaurant, buffet, and fast food.  Limit fried foods.  Cook foods using methods other than frying.  Limit canned vegetables. If you do use them, rinse them well to decrease the sodium.  When eating at a restaurant, ask that your food be prepared with less salt, or no salt if possible.                      WHAT FOODS CAN I EAT? Read Dr Fara Olden Fuhrman's books on The End of Dieting & The End of Diabetes  Grains Whole grain or whole wheat bread. Brown rice. Whole grain or whole wheat pasta. Quinoa, bulgur, and whole grain cereals. Low-sodium cereals. Corn or whole wheat flour tortillas. Whole grain cornbread. Whole grain crackers. Low-sodium crackers.  Vegetables Fresh or frozen vegetables (raw, steamed, roasted, or grilled). Low-sodium or  reduced-sodium tomato and vegetable juices. Low-sodium or reduced-sodium tomato sauce and paste. Low-sodium or reduced-sodium canned vegetables.   Fruits All fresh, canned (in natural juice), or frozen fruits.  Protein Products  All fish and seafood.  Dried beans, peas, or lentils. Unsalted nuts and seeds. Unsalted canned beans.  Dairy Low-fat dairy products, such as skim or 1% milk, 2% or reduced-fat cheeses, low-fat ricotta or cottage cheese, or plain low-fat yogurt. Low-sodium or reduced-sodium cheeses.  Fats and Oils Tub margarines without trans fats. Light or reduced-fat mayonnaise and salad dressings (reduced sodium). Avocado. Safflower, olive, or canola oils. Natural peanut or almond butter.  Other Unsalted popcorn and  pretzels. The items listed above may not be a complete list of recommended foods or beverages. Contact your dietitian for more options.  +++++++++++++++  WHAT FOODS ARE NOT RECOMMENDED? Grains/ White flour or wheat flour White bread. White pasta. White rice. Refined cornbread. Bagels and croissants. Crackers that contain trans fat.  Vegetables  Creamed or fried vegetables. Vegetables in a . Regular canned vegetables. Regular canned tomato sauce and paste. Regular tomato and vegetable juices.  Fruits Dried fruits. Canned fruit in light or heavy syrup. Fruit juice.  Meat and Other Protein Products Meat in general - RED meat & White meat.  Fatty cuts of meat. Ribs, chicken wings, all processed meats as bacon, sausage, bologna, salami, fatback, hot dogs, bratwurst and packaged luncheon meats.  Dairy Whole or 2% milk, cream, half-and-half, and cream cheese. Whole-fat or sweetened yogurt. Full-fat cheeses or blue cheese. Non-dairy creamers and whipped toppings. Processed cheese, cheese spreads, or cheese curds.  Condiments Onion and garlic salt, seasoned salt, table salt, and sea salt. Canned and packaged gravies. Worcestershire sauce. Tartar sauce. Barbecue  sauce. Teriyaki sauce. Soy sauce, including reduced sodium. Steak sauce. Fish sauce. Oyster sauce. Cocktail sauce. Horseradish. Ketchup and mustard. Meat flavorings and tenderizers. Bouillon cubes. Hot sauce. Tabasco sauce. Marinades. Taco seasonings. Relishes.  Fats and Oils Butter, stick margarine, lard, shortening and bacon fat. Coconut, palm kernel, or palm oils. Regular salad dressings.  Pickles and olives. Salted popcorn and pretzels.  The items listed above may not be a complete list of foods and beverages to avoid.

## 2019-10-08 LAB — COMPLETE METABOLIC PANEL WITH GFR
AG Ratio: 2 (calc) (ref 1.0–2.5)
ALT: 14 U/L (ref 6–29)
AST: 18 U/L (ref 10–35)
Albumin: 4.3 g/dL (ref 3.6–5.1)
Alkaline phosphatase (APISO): 44 U/L (ref 37–153)
BUN: 24 mg/dL (ref 7–25)
CO2: 28 mmol/L (ref 20–32)
Calcium: 10.1 mg/dL (ref 8.6–10.4)
Chloride: 106 mmol/L (ref 98–110)
Creat: 0.84 mg/dL (ref 0.60–0.88)
GFR, Est African American: 70 mL/min/{1.73_m2} (ref >=60)
GFR, Est Non African American: 61 mL/min/{1.73_m2} (ref >=60)
Globulin: 2.1 g/dL (calc) (ref 1.9–3.7)
Glucose, Bld: 102 mg/dL — ABNORMAL HIGH (ref 65–99)
Potassium: 4.3 mmol/L (ref 3.5–5.3)
Sodium: 140 mmol/L (ref 135–146)
Total Bilirubin: 0.6 mg/dL (ref 0.2–1.2)
Total Protein: 6.4 g/dL (ref 6.1–8.1)

## 2019-10-08 LAB — CBC WITH DIFFERENTIAL/PLATELET
Absolute Monocytes: 719 cells/uL (ref 200–950)
Basophils Absolute: 47 cells/uL (ref 0–200)
Basophils Relative: 0.6 %
Eosinophils Absolute: 63 cells/uL (ref 15–500)
Eosinophils Relative: 0.8 %
HCT: 42.1 % (ref 35.0–45.0)
Hemoglobin: 14 g/dL (ref 11.7–15.5)
Lymphs Abs: 830 cells/uL — ABNORMAL LOW (ref 850–3900)
MCH: 29.9 pg (ref 27.0–33.0)
MCHC: 33.3 g/dL (ref 32.0–36.0)
MCV: 89.8 fL (ref 80.0–100.0)
MPV: 10 fL (ref 7.5–12.5)
Monocytes Relative: 9.1 %
Neutro Abs: 6241 cells/uL (ref 1500–7800)
Neutrophils Relative %: 79 %
Platelets: 381 10*3/uL (ref 140–400)
RBC: 4.69 10*6/uL (ref 3.80–5.10)
RDW: 13.7 % (ref 11.0–15.0)
Total Lymphocyte: 10.5 %
WBC: 7.9 10*3/uL (ref 3.8–10.8)

## 2019-10-08 LAB — VITAMIN D 25 HYDROXY (VIT D DEFICIENCY, FRACTURES): Vit D, 25-Hydroxy: 41 ng/mL (ref 30–100)

## 2019-10-13 NOTE — Progress Notes (Signed)
Please let patient know labs are all in normal range.  Vitamin D is 41.  Increase Vitamin D to 5,000IU daily.   Thank you,              Bella Kennedy

## 2019-12-06 DIAGNOSIS — H401431 Capsular glaucoma with pseudoexfoliation of lens, bilateral, mild stage: Secondary | ICD-10-CM | POA: Diagnosis not present

## 2020-01-03 ENCOUNTER — Other Ambulatory Visit: Payer: Self-pay | Admitting: Internal Medicine

## 2020-01-03 MED ORDER — LORAZEPAM 1 MG PO TABS: ORAL_TABLET | ORAL | 0 refills | Status: DC

## 2020-02-01 ENCOUNTER — Encounter: Payer: Self-pay | Admitting: Internal Medicine

## 2020-02-01 NOTE — Progress Notes (Signed)
Comprehensive Evaluation &  Examination     This very nice 86 y.o.   WWF  presents for a  comprehensive evaluation and management of multiple medical co-morbidities.  Patient has been followed for HTN, HLD, Prediabetes  and Vitamin D Deficiency.      Labile HTN monitored expectantly  predates since 1980. Patient's BP has been controlled at home and patient denies any cardiac symptoms as chest pain, palpitations, shortness of breath, dizziness or ankle swelling. Today's BP is borderline high at 142/86.       Patient's hyperlipidemia is controlled with diet and medications. Patient denies myalgias or other medication SE's. Last lipids were not at goal with elevated LDL Chol 109:  Lab Results  Component Value Date   CHOL 184 12/30/2018   HDL 55 12/30/2018   LDLCALC 109 (H) 12/30/2018   TRIG 108 12/30/2018   CHOLHDL 3.3 12/30/2018       Patient has hx/o prediabetes  (A1c 5.7% / 2012) and patient denies reactive hypoglycemic symptoms, visual blurring, diabetic polys or paresthesias. Last A1c was near goal:  Lab Results  Component Value Date   HGBA1C 5.7 (H) 07/07/2019       Finally, patient has history of Vitamin D Deficiency and last Vitamin D was strill low (goal 70-100):  Lab Results  Component Value Date   VD25OH 41 10/07/2019    Current Outpatient Medications on File Prior to Visit  Medication Sig  . cholecalciferol (VITAMIN D3) 25 MCG (1000 UT) tablet Take 2,000 Units by mouth daily.   Marland Kitchen latanoprost (XALATAN) 0.005 % ophthalmic solution   . LORazepam (ATIVAN) 1 MG tablet Take 1/2 to 1 tablet 2 to 3 x / day  ONLY if needed for Anxiety Attack & please try to limit to 5 days /week to avoid Addiction & Dementia   No current facility-administered medications on file prior to visit.   Allergies  Allergen Reactions  . Adhesive [Tape]   . Advil [Ibuprofen]   . Atenolol    History reviewed. No pertinent past medical history.   Health Maintenance  Topic Date Due  . DEXA  SCAN  Never done  . COVID-19 Vaccine  Completed  . INFLUENZA VACCINE  Discontinued  . TETANUS/TDAP  Discontinued  . PNA vac Low Risk Adult  Discontinued   Immunization History  Administered Date(s) Administered  . DTaP 05/06/2003  . PFIZER SARS-COV-2 Vaccination 09/22/2019, 10/13/2019  . Pneumococcal Polysaccharide-23 05/06/2003  . Zoster 07/16/2007    Last Colon - patient refuses f/u  Last MGM - patient refuses f/u  Past Surgical History:  Procedure Laterality Date  . GLAUCOMA SURGERY  2013  . TONSILLECTOMY     Family History  Problem Relation Age of Onset  . Heart attack Brother   . Heart disease Brother   . Heart attack Brother   . Heart disease Brother   . Hypertension Mother   . Stroke Mother   . Heart attack Father   . Diabetes Father    Social History   Tobacco Use  . Smoking status: Former Smoker    Quit date: 07/16/1979    Years since quitting: 40.5  . Smokeless tobacco: Never Used  Substance Use Topics  . Alcohol use: No    Alcohol/week: 0.0 standard drinks  . Drug use: No    ROS Constitutional: Denies fever, chills, weight loss/gain, headaches, insomnia,  night sweats, and change in appetite. Does c/o fatigue. Eyes: Denies redness, blurred vision, diplopia, discharge, itchy, watery eyes.  ENT: Denies discharge, congestion, post nasal drip, epistaxis, sore throat, earache, hearing loss, dental pain, Tinnitus, Vertigo, Sinus pain, snoring.  Cardio: Denies chest pain, palpitations, irregular heartbeat, syncope, dyspnea, diaphoresis, orthopnea, PND, claudication, edema Respiratory: denies cough, dyspnea, DOE, pleurisy, hoarseness, laryngitis, wheezing.  Gastrointestinal: Denies dysphagia, heartburn, reflux, water brash, pain, cramps, nausea, vomiting, bloating, diarrhea, constipation, hematemesis, melena, hematochezia, jaundice, hemorrhoids Genitourinary: Denies dysuria, frequency, urgency, nocturia, hesitancy, discharge, hematuria, flank pain Breast: Breast  lumps, nipple discharge, bleeding.  Musculoskeletal: Denies arthralgia, myalgia, stiffness, Jt. Swelling, pain, limp, and strain/sprain. Denies falls. Skin: Denies puritis, rash, hives, warts, acne, eczema, changing in skin lesion Neuro: No weakness, tremor, incoordination, spasms, paresthesia, pain Psychiatric: Denies confusion, memory loss, sensory loss. Denies Depression. Endocrine: Denies change in weight, skin, hair change, nocturia, and paresthesia, diabetic polys, visual blurring, hyper / hypo glycemic episodes.  Heme/Lymph: No excessive bleeding, bruising, enlarged lymph nodes.  Physical Exam  BP (!) 142/86   Pulse 88   Temp (!) 97 F (36.1 C)   Resp 16   Ht 5' 1.5" (1.562 m)   Wt 121 lb (54.9 kg)   BMI 22.49 kg/m   General Appearance: Well nourished, well groomed and in no apparent distress.  Eyes: PERRLA, EOMs, conjunctiva no swelling or erythema, normal fundi and vessels. Sinuses: No frontal/maxillary tenderness ENT/Mouth: EACs patent / TMs  nl. Nares clear without erythema, swelling, mucoid exudates. Oral hygiene is good. No erythema, swelling, or exudate. Tongue normal, non-obstructing. Tonsils not swollen or erythematous. Hearing normal.  Neck: Supple, thyroid not palpable. No bruits, nodes or JVD. Respiratory: Respiratory effort normal.  BS equal and clear bilateral without rales, rhonci, wheezing or stridor. Cardio: Heart sounds are normal with regular rate and rhythm and no murmurs, rubs or gallops. Peripheral pulses are normal and equal bilaterally without edema. No aortic or femoral bruits. Chest: symmetric with normal excursions and percussion. Breasts: Symmetric, without lumps, nipple discharge, retractions, or fibrocystic changes.  Abdomen: Flat, soft with bowel sounds active. Nontender, no guarding, rebound, hernias, masses, or organomegaly.  Lymphatics: Non tender without lymphadenopathy.   Musculoskeletal: Full ROM all peripheral extremities, joint stability,  5/5 strength, and normal gait. Skin: Warm and dry without rashes, lesions, cyanosis, clubbing or  ecchymosis.  Neuro: Cranial nerves intact, reflexes equal bilaterally. Normal muscle tone, no cerebellar symptoms. Sensation intact.  Pysch: Alert and oriented X 3, normal affect, Insight and Judgment appropriate.   Assessment and Plan  1. Labile hypertension  - EKG 12-Lead - Urinalysis, Routine w reflex microscopic - Microalbumin / creatinine urine ratio - CBC with Differential/Platelet - COMPLETE METABOLIC PANEL WITH GFR - Magnesium - TSH  2. Hyperlipidemia, mixed  - EKG 12-Lead - Lipid panel - TSH  3. Abnormal glucose  - EKG 12-Lead - Hemoglobin A1c - Insulin, random  4. Vitamin D deficiency  - VITAMIN D 25 Hydroxy   5. Screening for colorectal cancer  - POC Hemoccult Bld/Stl   6. Screening for ischemic heart disease  - EKG 12-Lead  7. FHx: heart disease  - EKG 12-Lead  8. Former smoker  - EKG 12-Lead  9. Medication management  - Urinalysis, Routine w reflex microscopic - Microalbumin / creatinine urine ratio - CBC with Differential/Platelet - COMPLETE METABOLIC PANEL WITH GFR - Magnesium - Lipid panel - TSH - Hemoglobin A1c - Insulin, random - VITAMIN D 25 Hydroxy          Patient was counseled in prudent diet to achieve/maintain BMI less than 25 for weight control, BP  monitoring, regular exercise and medications. Discussed med's effects and SE's. Screening labs and tests as requested with regular follow-up as recommended. Over 40 minutes of exam, counseling, chart review and high complex critical decision making was performed.   Marinus Maw, MD

## 2020-02-01 NOTE — Patient Instructions (Signed)

## 2020-02-02 ENCOUNTER — Ambulatory Visit (INDEPENDENT_AMBULATORY_CARE_PROVIDER_SITE_OTHER): Payer: Medicare Other | Admitting: Internal Medicine

## 2020-02-02 ENCOUNTER — Other Ambulatory Visit: Payer: Self-pay

## 2020-02-02 VITALS — BP 142/86 | HR 88 | Temp 97.0°F | Resp 16 | Ht 61.5 in | Wt 121.0 lb

## 2020-02-02 DIAGNOSIS — R7309 Other abnormal glucose: Secondary | ICD-10-CM | POA: Diagnosis not present

## 2020-02-02 DIAGNOSIS — I447 Left bundle-branch block, unspecified: Secondary | ICD-10-CM | POA: Diagnosis not present

## 2020-02-02 DIAGNOSIS — Z79899 Other long term (current) drug therapy: Secondary | ICD-10-CM | POA: Diagnosis not present

## 2020-02-02 DIAGNOSIS — Z87891 Personal history of nicotine dependence: Secondary | ICD-10-CM | POA: Diagnosis not present

## 2020-02-02 DIAGNOSIS — E559 Vitamin D deficiency, unspecified: Secondary | ICD-10-CM

## 2020-02-02 DIAGNOSIS — R0989 Other specified symptoms and signs involving the circulatory and respiratory systems: Secondary | ICD-10-CM | POA: Diagnosis not present

## 2020-02-02 DIAGNOSIS — Z8249 Family history of ischemic heart disease and other diseases of the circulatory system: Secondary | ICD-10-CM

## 2020-02-02 DIAGNOSIS — E782 Mixed hyperlipidemia: Secondary | ICD-10-CM

## 2020-02-02 DIAGNOSIS — Z136 Encounter for screening for cardiovascular disorders: Secondary | ICD-10-CM | POA: Diagnosis not present

## 2020-02-02 DIAGNOSIS — Z1211 Encounter for screening for malignant neoplasm of colon: Secondary | ICD-10-CM

## 2020-02-03 ENCOUNTER — Other Ambulatory Visit: Payer: Self-pay | Admitting: Internal Medicine

## 2020-02-03 LAB — LIPID PANEL
Cholesterol: 170 mg/dL (ref <200)
HDL: 53 mg/dL (ref >=50)
LDL Cholesterol (Calc): 96 mg/dL (calc)
Non-HDL Cholesterol (Calc): 117 mg/dL (calc) (ref <130)
Total CHOL/HDL Ratio: 3.2 (calc) (ref <5.0)
Triglycerides: 113 mg/dL (ref <150)

## 2020-02-03 LAB — COMPLETE METABOLIC PANEL WITH GFR
AG Ratio: 2 (calc) (ref 1.0–2.5)
ALT: 13 U/L (ref 6–29)
AST: 15 U/L (ref 10–35)
Albumin: 4.3 g/dL (ref 3.6–5.1)
Alkaline phosphatase (APISO): 40 U/L (ref 37–153)
BUN/Creatinine Ratio: 18 (calc) (ref 6–22)
BUN: 19 mg/dL (ref 7–25)
CO2: 29 mmol/L (ref 20–32)
Calcium: 10 mg/dL (ref 8.6–10.4)
Chloride: 106 mmol/L (ref 98–110)
Creat: 1.07 mg/dL — ABNORMAL HIGH (ref 0.60–0.88)
GFR, Est African American: 53 mL/min/{1.73_m2} — ABNORMAL LOW (ref >=60)
GFR, Est Non African American: 45 mL/min/{1.73_m2} — ABNORMAL LOW (ref >=60)
Globulin: 2.2 g/dL (calc) (ref 1.9–3.7)
Glucose, Bld: 101 mg/dL — ABNORMAL HIGH (ref 65–99)
Potassium: 4.4 mmol/L (ref 3.5–5.3)
Sodium: 141 mmol/L (ref 135–146)
Total Bilirubin: 0.6 mg/dL (ref 0.2–1.2)
Total Protein: 6.5 g/dL (ref 6.1–8.1)

## 2020-02-03 LAB — HEMOGLOBIN A1C
Hgb A1c MFr Bld: 5.6 % of total Hgb (ref <5.7)
Mean Plasma Glucose: 114 (calc)
eAG (mmol/L): 6.3 (calc)

## 2020-02-03 LAB — CBC WITH DIFFERENTIAL/PLATELET
Absolute Monocytes: 626 cells/uL (ref 200–950)
Basophils Absolute: 58 cells/uL (ref 0–200)
Basophils Relative: 0.8 %
Eosinophils Absolute: 58 cells/uL (ref 15–500)
Eosinophils Relative: 0.8 %
HCT: 41.8 % (ref 35.0–45.0)
Hemoglobin: 13.8 g/dL (ref 11.7–15.5)
Lymphs Abs: 835 cells/uL — ABNORMAL LOW (ref 850–3900)
MCH: 29.9 pg (ref 27.0–33.0)
MCHC: 33 g/dL (ref 32.0–36.0)
MCV: 90.7 fL (ref 80.0–100.0)
MPV: 9.9 fL (ref 7.5–12.5)
Monocytes Relative: 8.7 %
Neutro Abs: 5623 cells/uL (ref 1500–7800)
Neutrophils Relative %: 78.1 %
Platelets: 380 10*3/uL (ref 140–400)
RBC: 4.61 10*6/uL (ref 3.80–5.10)
RDW: 14.2 % (ref 11.0–15.0)
Total Lymphocyte: 11.6 %
WBC: 7.2 10*3/uL (ref 3.8–10.8)

## 2020-02-03 LAB — URINALYSIS, ROUTINE W REFLEX MICROSCOPIC
Bacteria, UA: NONE SEEN /HPF
Bilirubin Urine: NEGATIVE
Glucose, UA: NEGATIVE
Hgb urine dipstick: NEGATIVE
Hyaline Cast: NONE SEEN /LPF
Ketones, ur: NEGATIVE
Nitrite: NEGATIVE
Protein, ur: NEGATIVE
RBC / HPF: NONE SEEN /HPF (ref 0–2)
Specific Gravity, Urine: 1.021 (ref 1.001–1.03)
pH: 6 (ref 5.0–8.0)

## 2020-02-03 LAB — VITAMIN D 25 HYDROXY (VIT D DEFICIENCY, FRACTURES): Vit D, 25-Hydroxy: 58 ng/mL (ref 30–100)

## 2020-02-03 LAB — MICROALBUMIN / CREATININE URINE RATIO
Creatinine, Urine: 147 mg/dL (ref 20–275)
Microalb Creat Ratio: 12 mcg/mg creat (ref <30)
Microalb, Ur: 1.8 mg/dL

## 2020-02-03 LAB — TSH: TSH: 2.01 mIU/L (ref 0.40–4.50)

## 2020-02-03 LAB — INSULIN, RANDOM: Insulin: 14.2 u[IU]/mL

## 2020-02-03 LAB — MAGNESIUM: Magnesium: 2.4 mg/dL (ref 1.5–2.5)

## 2020-02-03 MED ORDER — LORAZEPAM 1 MG PO TABS: ORAL_TABLET | ORAL | 0 refills | Status: DC

## 2020-03-01 ENCOUNTER — Other Ambulatory Visit: Payer: Self-pay | Admitting: Internal Medicine

## 2020-03-01 MED ORDER — LORAZEPAM 0.5 MG PO TABS: ORAL_TABLET | ORAL | 0 refills | Status: DC

## 2020-03-11 ENCOUNTER — Other Ambulatory Visit: Payer: Self-pay | Admitting: Internal Medicine

## 2020-04-12 ENCOUNTER — Ambulatory Visit: Payer: Medicare Other | Admitting: Adult Health

## 2020-05-03 ENCOUNTER — Other Ambulatory Visit: Payer: Self-pay | Admitting: Internal Medicine

## 2020-05-03 MED ORDER — LORAZEPAM 0.5 MG PO TABS: ORAL_TABLET | ORAL | 0 refills | Status: DC

## 2020-05-12 DIAGNOSIS — H401431 Capsular glaucoma with pseudoexfoliation of lens, bilateral, mild stage: Secondary | ICD-10-CM | POA: Diagnosis not present

## 2020-05-15 DIAGNOSIS — N183 Chronic kidney disease, stage 3 unspecified: Secondary | ICD-10-CM | POA: Insufficient documentation

## 2020-05-15 DIAGNOSIS — Z6822 Body mass index (BMI) 22.0-22.9, adult: Secondary | ICD-10-CM | POA: Insufficient documentation

## 2020-05-15 DIAGNOSIS — Z681 Body mass index (BMI) 19 or less, adult: Secondary | ICD-10-CM | POA: Insufficient documentation

## 2020-05-15 NOTE — Progress Notes (Signed)
MEDICARE ANNUAL WELLNESS VISIT AND FOLLOW UP  Assessment:    Diagnoses and all orders for this visit:  Encounter for Medicare annual wellness exam Declines colon cancer screening, mammogram, DEXA and influenza vaccine after discussion of risks and benefits  Prefers minimal interventions  LBBB (left bundle branch block) Avoid rate controlling drugs, monitor, annual EKG  Labile hypertension Currently off of medications and at goal Monitor blood pressure at home; call if consistently over 140/90 Continue DASH diet.   Reminder to go to the ER if any CP, SOB, nausea, dizziness, severe HA, changes vision/speech, left arm numbness and tingling and jaw pain.  CKD IIIa (HCC) Increase fluids, avoid NSAIDS, monitor sugars, will monitor - CMP/GFR  Vitamin D deficiency At goal at recent check; continue to recommend supplementation for goal of 60-100 Defer vitamin D level  Hx of Prediabetes Discussed disease and risks Discussed diet/exercise, weight management  A1C annually; monitor CMP/weight  Medication management CBC, CMP/GFR  Hyperlipidemia No longer treated secondary to age; check annually at CPE Continue lifestyle  Anxiety Takes ativan at night as needed for sleep; anxiety recently well controlled Continue lifestyle efforts  BMI 22 Continue to recommend diet heavy in fruits and veggies and low in animal meats, cheeses, and dairy products, appropriate calorie intake Discuss exercise recommendations routinely Continue to monitor weight at each visit   Over 40 minutes of exam, counseling, chart review and critical decision making was performed Future Appointments  Date Time Provider Department Center  08/29/2020 11:30 AM Lucky Cowboy, MD [facility] None  02/05/2021 11:00 AM Lucky Cowboy, MD [facility] None  05/17/2021 11:00 AM Judd Gaudier, NP [facility] None     Plan:   During the course of the visit the patient was educated and counseled about appropriate  screening and preventive services including:    Pneumococcal vaccine   Prevnar 13  Influenza vaccine  Td vaccine  Screening electrocardiogram  Bone densitometry screening  Colorectal cancer screening  Diabetes screening  Glaucoma screening  Nutrition counseling   Advanced directives: requested   Subjective:  Leslie [name] is a 86 y.o. female who presents for Medicare Annual Wellness Visit and 3 month follow up.   She is currently prescribed ativan 0.5 mg for anxiety (reduced from previous 1 mg); she reports she typically takes at night for sleep, taking 1/2 tab (0.25 mg). Hasn't needed during the day. 30 tabs typically last 60 days.  She has also been prescribed tramadol for aches and pains in the past; she has been transitioned to tylenol but she reports she never takes, has been going to the chiropractor every two weeks and uses home massage machine and doing well.  BMI is Body mass index is 22.31 kg/m., she has been working on diet and exercise. Walks 5 days a week..  Wt Readings from Last 3 Encounters:  05/17/20 120 lb (54.4 kg)  02/02/20 121 lb (54.9 kg)  10/07/19 122 lb (55.3 kg)    Her blood pressure has been controlled at home (100s-140/80s), today their BP is BP: 138/84 She does workout. She denies chest pain, shortness of breath, dizziness.   She is not on cholesterol medication and denies myalgias. Her cholesterol is at goal. The cholesterol last visit was:   Lab Results  Component Value Date   CHOL 170 02/02/2020   HDL 53 02/02/2020   LDLCALC 96 02/02/2020   TRIG 113 02/02/2020   CHOLHDL 3.2 02/02/2020    She has been working on diet and exercise for hx prediabetes (  rarely eats bread, does have a sweet tooth), and denies foot ulcerations, increased appetite, nausea, paresthesia of the feet, polydipsia, polyuria, visual disturbances, vomiting and weight loss. Last A1C in the office was:  Lab Results  Component Value Date   HGBA1C 5.6 02/02/2020    She does push water intake, at minimum 5 glasses of ? 16 oz. Last GFR: Lab Results  Component Value Date   GFRNONAA 45 (L) 02/02/2020   GFRNONAA 61 10/07/2019   GFRNONAA 56 (L) 07/07/2019   Patient is on Vitamin D supplement,  taking 3000 IU and at goal at recent check:    Lab Results  Component Value Date   VD25OH 58 02/02/2020      Medication Review: Current Outpatient Medications on File Prior to Visit  Medication Sig Dispense Refill  . cholecalciferol (VITAMIN D3) 25 MCG (1000 UT) tablet Take 2,000 Units by mouth daily.     Marland Kitchen latanoprost (XALATAN) 0.005 % ophthalmic solution     . LORazepam (ATIVAN) 0.5 MG tablet Take 1/2 to 1 tablet 2 to 3 x / day  ONLY if needed for Anxiety Attack & please try to limit to 5 days /week to avoid Addiction & Dementia 30 tablet 0  . SENNA PO Take by mouth daily.     No current facility-administered medications on file prior to visit.    Allergies  Allergen Reactions  . Adhesive [Tape]   . Advil [Ibuprofen]   . Atenolol     Current Problems (verified) Patient Active Problem List   Diagnosis Date Noted  . BMI 22.0-22.9, adult 05/15/2020  . CKD (chronic kidney disease) stage 3, GFR 30-59 ml/min (HCC) 05/15/2020  . Labile hypertension 06/16/2018  . FHx: heart disease 06/16/2018  . Former smoker 06/16/2018  . Abnormal glucose 06/16/2018  . Anxiety 03/04/2018  . Medication management 04/08/2014  . Vitamin D deficiency 07/01/2013  . Hyperlipidemia, mixed 07/01/2013  . LBBB (left bundle branch block) 02/20/2010    Screening Tests Immunization History  Administered Date(s) Administered  . DTaP 05/06/2003  . PFIZER SARS-COV-2 Vaccination 09/22/2019, 10/13/2019  . Pneumococcal Polysaccharide-23 05/06/2003  . Zoster 07/16/2007   Preventative care: Last colonoscopy: never, declines any screening  Last mammogram: 2012, declines further mammograms DEXA: declines Last CXR: 2013, former smoker quit in 1981, declines repeat CXR today,  denies concerning sx  Prior vaccinations: TD or Tdap: 2004, declines futher due to cost  Influenza: Declines Pneumococcal: 2004 Prevnar13: Declines Shingles/Zostavax: 2009 Covid 19: 2/2, 2021, pfizer   Names of Other Physician/Practitioners you currently use: 1. [facility]- here for primary care 2. Dr. Randon Goldsmith, eye doctor, last visit 2021, sees q54m, glaucoma well controlled with drops 3. Dr.  Melynda Ripple, dentist, last visit 2020- goes annually, plans to follow up  Patient Care Team: Lucky Cowboy, MD as PCP - General (Internal Medicine)  SURGICAL HISTORY She  has a past surgical history that includes Glaucoma surgery (2013) and Tonsillectomy. FAMILY HISTORY Her family history includes Diabetes in her father; Heart attack in her brother, brother, and father; Heart disease in her brother and brother; Hypertension in her mother; Stroke in her mother. SOCIAL HISTORY She  reports that she quit smoking about 40 years ago. She has never used smokeless tobacco. She reports that she does not drink alcohol and does not use drugs.   MEDICARE WELLNESS OBJECTIVES: Physical activity: Current Exercise Habits: Home exercise routine, Type of exercise: walking, Time (Minutes): 30, Frequency (Times/Week): 5, Weekly Exercise (Minutes/Week): 150, Intensity: Mild,  Exercise limited by: None identified Cardiac risk factors: Cardiac Risk Factors include: advanced age (>63men, >17 women);hypertension;smoking/ tobacco exposure Depression/mood screen:   Depression screen [facility] 2/9 05/17/2020  Decreased Interest 0  Down, Depressed, Hopeless 0  PHQ - 2 Score 0    ADLs:  In your present state of health, do you have any difficulty performing the following activities: 05/17/2020 02/01/2020  Hearing? N N  Vision? N N  Difficulty concentrating or making decisions? N N  Walking or climbing stairs? N N  Dressing or bathing? N N  Doing errands, shopping? N N  Some recent data might  be hidden     Cognitive Testing  Alert? Yes  Normal Appearance?Yes  Oriented to person? Yes  Place? Yes   Time? Yes  Recall of three objects?  Yes  Can perform simple calculations? Yes  Displays appropriate judgment?Yes  Can read the correct time from a watch face?Yes  EOL planning: Does Patient Have a Medical Advance Directive?: Yes Type of Advance Directive: Healthcare Power of Attorney, Living will Does patient want to make changes to medical advance directive?: No - Patient declined Copy of Healthcare Power of Attorney in Chart?: No - copy requested  Review of Systems  Constitutional: Negative for malaise/fatigue and weight loss.  HENT: Negative for hearing loss and tinnitus.   Eyes: Negative for blurred vision and double vision.  Respiratory: Negative for cough, sputum production, shortness of breath and wheezing.   Cardiovascular: Negative for chest pain, palpitations, orthopnea, claudication, leg swelling and PND.  Gastrointestinal: Negative for abdominal pain, blood in stool, constipation, diarrhea, heartburn, melena, nausea and vomiting.  Genitourinary: Negative.   Musculoskeletal: Positive for back pain. Negative for falls, joint pain and myalgias.  Skin: Negative for rash.  Neurological: Negative for dizziness, tingling, sensory change, weakness and headaches.  Endo/Heme/Allergies: Negative for polydipsia.  Psychiatric/Behavioral: Negative.  Negative for depression, memory loss, substance abuse and suicidal ideas. The patient is not nervous/anxious and does not have insomnia.   All other systems reviewed and are negative.    Objective:     Today's Vitals   05/17/20 1143 05/17/20 1227  BP: (!) 148/96 138/84  Pulse: 86   Temp: (!) 97.3 F (36.3 C)   SpO2: 98%   Weight: 120 lb (54.4 kg)    Body mass index is 22.31 kg/m.  General appearance: alert, no distress, WD/WN, female HEENT: normocephalic, sclerae anicteric, TMs pearly, nares patent, no discharge or  erythema, pharynx normal Oral cavity: MMM, no lesions Neck: supple, no lymphadenopathy, no thyromegaly, no masses Heart: RRR, normal S1, S2, no murmurs Lungs: CTA bilaterally, no wheezes, rhonchi, or rales Abdomen: +bs, soft, non tender, non distended, no masses, no hepatomegaly, no splenomegaly Musculoskeletal: nontender, no swelling, no obvious deformity Extremities: no edema, no cyanosis, no clubbing Pulses: 2+ symmetric, upper and lower extremities, normal cap refill Neurological: alert, oriented x 3, CN2-12 intact, strength normal upper extremities and lower extremities, sensation normal throughout, DTRs 2+ throughout, no cerebellar signs, gait normal Psychiatric: normal affect, behavior normal, pleasant   Medicare Attestation I have personally reviewed: The patient's medical and social history Their use of alcohol, tobacco or illicit drugs Their current medications and supplements The patient's functional ability including ADLs,fall risks, home safety risks, cognitive, and hearing and visual impairment Diet and physical activities Evidence for depression or mood disorders  The patient's weight, height, BMI, and visual acuity have been recorded in the chart.  I have made referrals, counseling, and provided education to the patient  based on review of the above and I have provided the patient with a written personalized care plan for preventive services.     Dan MakerAshley C Damontay Alred, NP   05/17/2020

## 2020-05-17 ENCOUNTER — Encounter: Payer: Self-pay | Admitting: Adult Health

## 2020-05-17 ENCOUNTER — Other Ambulatory Visit: Payer: Self-pay

## 2020-05-17 ENCOUNTER — Ambulatory Visit (INDEPENDENT_AMBULATORY_CARE_PROVIDER_SITE_OTHER): Payer: Medicare Other | Admitting: Adult Health

## 2020-05-17 VITALS — BP 138/84 | HR 86 | Temp 97.3°F | Wt 120.0 lb

## 2020-05-17 DIAGNOSIS — N1831 Chronic kidney disease, stage 3a: Secondary | ICD-10-CM

## 2020-05-17 DIAGNOSIS — F419 Anxiety disorder, unspecified: Secondary | ICD-10-CM

## 2020-05-17 DIAGNOSIS — Z0000 Encounter for general adult medical examination without abnormal findings: Secondary | ICD-10-CM

## 2020-05-17 DIAGNOSIS — R6889 Other general symptoms and signs: Secondary | ICD-10-CM | POA: Diagnosis not present

## 2020-05-17 DIAGNOSIS — R0989 Other specified symptoms and signs involving the circulatory and respiratory systems: Secondary | ICD-10-CM | POA: Diagnosis not present

## 2020-05-17 DIAGNOSIS — E782 Mixed hyperlipidemia: Secondary | ICD-10-CM | POA: Diagnosis not present

## 2020-05-17 DIAGNOSIS — R7309 Other abnormal glucose: Secondary | ICD-10-CM | POA: Diagnosis not present

## 2020-05-17 DIAGNOSIS — Z0001 Encounter for general adult medical examination with abnormal findings: Secondary | ICD-10-CM

## 2020-05-17 DIAGNOSIS — Z87891 Personal history of nicotine dependence: Secondary | ICD-10-CM

## 2020-05-17 DIAGNOSIS — Z6822 Body mass index (BMI) 22.0-22.9, adult: Secondary | ICD-10-CM

## 2020-05-17 DIAGNOSIS — E559 Vitamin D deficiency, unspecified: Secondary | ICD-10-CM

## 2020-05-17 DIAGNOSIS — Z79899 Other long term (current) drug therapy: Secondary | ICD-10-CM | POA: Diagnosis not present

## 2020-05-17 DIAGNOSIS — I447 Left bundle-branch block, unspecified: Secondary | ICD-10-CM

## 2020-05-17 NOTE — Patient Instructions (Addendum)
Leslie [name] , Thank you for taking time to come for your Medicare Wellness Visit. I appreciate your ongoing commitment to your health goals. Please review the following plan we discussed and let me know if I can assist you in the future.   These are the goals we discussed: Goals    . Blood Pressure < 140/90       This is a list of the screening recommended for you and due dates:  Health Maintenance  Topic Date Due  . DEXA scan (bone density measurement)  Never done  . COVID-19 Vaccine  Completed  . Flu Shot  Discontinued  . Tetanus Vaccine  Discontinued  . Pneumonia vaccines  Discontinued      Chronic Kidney Disease, Adult Chronic kidney disease (CKD) occurs when the kidneys become damaged slowly over a long period of time. The kidneys are a pair of organs that do many important jobs in the body, including:  Removing waste and extra fluid from the blood to make urine.  Making hormones that maintain the amount of fluid in tissues and blood vessels.  Maintaining the right amount of fluids and chemicals in the body. A small amount of kidney damage may not cause problems, but a large amount of damage may make it hard or impossible for the kidneys to work the way they should. If steps are not taken to slow down kidney damage or to stop it from getting worse, the kidneys may stop working permanently (end-stage renal disease or ESRD). Most of the time, CKD does not go away, but it can often be controlled. People who have CKD are usually able to live normal lives. What are the causes? The most common causes of this condition are diabetes and high blood pressure (hypertension). Other causes include:  Heart and blood vessel (cardiovascular) disease.  Kidney diseases, such as: ? Glomerulonephritis. ? Interstitial nephritis. ? Polycystic kidney disease. ? Renal vascular disease.  Diseases that affect the immune system.  Genetic diseases.  Medicines that damage the kidneys, such as  anti-inflammatory medicines.  Being around or being in contact with poisonous (toxic) substances.  A kidney or urinary infection that occurs again and again (recurs).  Vasculitis. This is swelling or inflammation of the blood vessels.  A problem with urine flow that may be caused by: ? Cancer. ? Having kidney stones more than one time. ? An enlarged prostate, in males. What increases the risk? You are more likely to develop this condition if you:  Are older than age 38.  Are female.  Are African-American, Hispanic, Asian, [place] Islander, or American [place].  Are a current or former smoker.  Are obese.  Have a family history of kidney disease or failure.  Often take medicines that are damaging to the kidneys. What are the signs or symptoms? Symptoms of this condition include:  Swelling (edema) of the face, legs, ankles, or feet.  Tiredness (lethargy) and having less energy.  Nausea or vomiting.  Confusion or trouble concentrating.  Problems with urination, such as: ? Painful or burning feeling during urination. ? Decreased urine production. ? Frequent urination, especially at night. ? Bloody urine.  Muscle twitches and cramps, especially in the legs.  Shortness of breath.  Weakness.  Loss of appetite.  Metallic taste in the mouth.  Trouble sleeping.  Dry, itchy skin.  A low blood count (anemia).  Pale lining of the eyelids and surface of the eye (conjunctiva). Symptoms develop slowly and may not be obvious until the kidney  damage becomes severe. It is possible to have kidney disease for years without having any symptoms. How is this diagnosed? This condition may be diagnosed based on:  Blood tests.  Urine tests.  Imaging tests, such as an ultrasound or CT scan.  A test in which a sample of tissue is removed from the kidneys to be examined under a microscope (kidney biopsy). These test results will help your health care provider determine how  serious the CKD is. How is this treated? There is no cure for most cases of this condition, but treatment usually relieves symptoms and prevents or slows the progression of the disease. Treatment may include:  Making diet changes, which may require you to avoid alcohol, salty foods (sodium), and foods that are high in potassium, calcium, and protein.  Medicines: ? To lower blood pressure. ? To control blood glucose. ? To relieve anemia. ? To relieve swelling. ? To protect your bones. ? To improve the balance of electrolytes in your blood.  Removing toxic waste from the body through types of dialysis, if the kidneys can no longer do their job (kidney failure).  Managing any other conditions that are causing your CKD or making it worse. Follow these instructions at home: Medicines  Take over-the-counter and prescription medicines only as told by your health care provider. The dose of some medicines that you take may need to be adjusted.  Do not take any new medicines unless approved by your health care provider. Many medicines can worsen your kidney damage.  Do not take any vitamin and mineral supplements unless approved by your health care provider. Many nutritional supplements can worsen your kidney damage. General instructions  Follow your prescribed diet as told by your health care provider.  Do not use any products that contain nicotine or tobacco, such as cigarettes and e-cigarettes. If you need help quitting, ask your health care provider.  Monitor and track your blood pressure at home. Report changes in your blood pressure as told by your health care provider.  If you are being treated for diabetes, monitor and track your blood sugar (blood glucose) levels as told by your health care provider.  Maintain a healthy weight. If you need help with this, ask your health care provider.  Start or continue an exercise plan. Exercise at least 30 minutes a day, 5 days a week.  Keep  your immunizations up to date as told by your health care provider.  Keep all follow-up visits as told by your health care provider. This is important. Where to find more information  American Association of Kidney Patients: ResidentialShow.is  [employer]: www.kidney.org  American Kidney Fund: FightingMatch.com.ee  [facility]: www.lifeoptions.org and www.kidneyschool.org Contact a health care provider if:  Your symptoms get worse.  You develop new symptoms. Get help right away if:  You develop symptoms of ESRD, which include: ? Headaches. ? Numbness in the hands or feet. ? Easy bruising. ? Frequent hiccups. ? Chest pain. ? Shortness of breath. ? Lack of menstruation, in women.  You have a fever.  You have decreased urine production.  You have pain or bleeding when you urinate. Summary  Chronic kidney disease (CKD) occurs when the kidneys become damaged slowly over a long period of time.  The most common causes of this condition are diabetes and high blood pressure (hypertension).  There is no cure for most cases of this condition, but treatment usually relieves symptoms and prevents or slows the progression of the  disease. Treatment may include a combination of medicines and lifestyle changes. This information is not intended to replace advice given to you by your health care provider. Make sure you discuss any questions you have with your health care provider. Document Revised: 06/13/2017 Document Reviewed: 08/08/2016 Elsevier Patient Education  2020 [employer].

## 2020-05-18 LAB — COMPLETE METABOLIC PANEL WITH GFR
AG Ratio: 1.7 (calc) (ref 1.0–2.5)
ALT: 11 U/L (ref 6–29)
AST: 15 U/L (ref 10–35)
Albumin: 4.2 g/dL (ref 3.6–5.1)
Alkaline phosphatase (APISO): 51 U/L (ref 37–153)
BUN: 20 mg/dL (ref 7–25)
CO2: 27 mmol/L (ref 20–32)
Calcium: 10.5 mg/dL — ABNORMAL HIGH (ref 8.6–10.4)
Chloride: 104 mmol/L (ref 98–110)
Creat: 0.78 mg/dL (ref 0.60–0.88)
GFR, Est African American: 77 mL/min/{1.73_m2} (ref >=60)
GFR, Est Non African American: 66 mL/min/{1.73_m2} (ref >=60)
Globulin: 2.5 g/dL (calc) (ref 1.9–3.7)
Glucose, Bld: 90 mg/dL (ref 65–99)
Potassium: 4.9 mmol/L (ref 3.5–5.3)
Sodium: 139 mmol/L (ref 135–146)
Total Bilirubin: 0.5 mg/dL (ref 0.2–1.2)
Total Protein: 6.7 g/dL (ref 6.1–8.1)

## 2020-05-18 LAB — CBC WITH DIFFERENTIAL/PLATELET
Absolute Monocytes: 880 cells/uL (ref 200–950)
Basophils Absolute: 88 cells/uL (ref 0–200)
Basophils Relative: 1 %
Eosinophils Absolute: 238 cells/uL (ref 15–500)
Eosinophils Relative: 2.7 %
HCT: 42.9 % (ref 35.0–45.0)
Hemoglobin: 14.3 g/dL (ref 11.7–15.5)
Lymphs Abs: 986 cells/uL (ref 850–3900)
MCH: 30 pg (ref 27.0–33.0)
MCHC: 33.3 g/dL (ref 32.0–36.0)
MCV: 89.9 fL (ref 80.0–100.0)
MPV: 10 fL (ref 7.5–12.5)
Monocytes Relative: 10 %
Neutro Abs: 6609 cells/uL (ref 1500–7800)
Neutrophils Relative %: 75.1 %
Platelets: 424 10*3/uL — ABNORMAL HIGH (ref 140–400)
RBC: 4.77 10*6/uL (ref 3.80–5.10)
RDW: 13.4 % (ref 11.0–15.0)
Total Lymphocyte: 11.2 %
WBC: 8.8 10*3/uL (ref 3.8–10.8)

## 2020-05-18 LAB — LIPID PANEL
Cholesterol: 171 mg/dL (ref <200)
HDL: 49 mg/dL — ABNORMAL LOW (ref >=50)
LDL Cholesterol (Calc): 100 mg/dL (calc) — ABNORMAL HIGH
Non-HDL Cholesterol (Calc): 122 mg/dL (calc) (ref <130)
Total CHOL/HDL Ratio: 3.5 (calc) (ref <5.0)
Triglycerides: 122 mg/dL (ref <150)

## 2020-05-18 LAB — TSH: TSH: 2.34 mIU/L (ref 0.40–4.50)

## 2020-07-03 ENCOUNTER — Other Ambulatory Visit: Payer: Self-pay | Admitting: Internal Medicine

## 2020-07-03 MED ORDER — LORAZEPAM 0.5 MG PO TABS
ORAL_TABLET | ORAL | 0 refills | Status: DC
Start: 2020-07-03 — End: 2020-09-04

## 2020-08-23 ENCOUNTER — Other Ambulatory Visit: Payer: Self-pay

## 2020-08-23 ENCOUNTER — Ambulatory Visit
Admission: RE | Admit: 2020-08-23 | Discharge: 2020-08-23 | Disposition: A | Discharge status: Home / Self Care | Payer: Medicare Other | Source: Ambulatory Visit | Attending: Emergency Medicine | Admitting: Emergency Medicine

## 2020-08-23 VITALS — BP 149/79 | HR 103 | Temp 98.2°F | Resp 17

## 2020-08-23 DIAGNOSIS — K649 Unspecified hemorrhoids: Secondary | ICD-10-CM

## 2020-08-23 DIAGNOSIS — K625 Hemorrhage of anus and rectum: Secondary | ICD-10-CM | POA: Diagnosis not present

## 2020-08-23 LAB — CBC
Hematocrit: 38.8 % (ref 34.0–46.6)
Hemoglobin: 13.3 g/dL (ref 11.1–15.9)
MCH: 29.4 pg (ref 26.6–33.0)
MCHC: 34.3 g/dL (ref 31.5–35.7)
MCV: 86 fL (ref 79–97)
Platelets: 471 10*3/uL — ABNORMAL HIGH (ref 150–450)
RBC: 4.53 x10E6/uL (ref 3.77–5.28)
RDW: 15.2 % (ref 11.7–15.4)
WBC: 10.7 10*3/uL (ref 3.4–10.8)

## 2020-08-23 MED ORDER — HYDROCORTISONE ACETATE 25 MG RE SUPP: 25.0000 mg | Freq: Two times a day (BID) | RECTAL | 0 refills | Status: DC

## 2020-08-23 MED ORDER — HYDROCORTISONE (PERIANAL) 2.5 % EX CREA: 1.0000 "application " | TOPICAL_CREAM | Freq: Two times a day (BID) | CUTANEOUS | 0 refills | Status: DC

## 2020-08-23 NOTE — ED Triage Notes (Signed)
Patient states she has had a hemorrhoid that has been worsening over the last 2 days. Pt is ao x 4 and ambulates at baseline.

## 2020-08-23 NOTE — Discharge Instructions (Signed)
Please use suppositories/cream twice daily over the next 5 days Continue sitz bath's twice daily Soak warm black/green tea to rectum 2-3 time daily x 10 min Follow up with surgery if persisting - contact belowe

## 2020-08-23 NOTE — ED Provider Notes (Signed)
[name]    CSN: [id number] Arrival date & time: 08/23/20  0940      History   Chief Complaint Chief Complaint  Patient presents with  . Hemorrhoids    Began 2 days ago    HPI Leslie [name] is a 87 y.o. female history of CKD presenting today for evaluation of hemorrhoids.  Reports severe hemorrhoids for approximately 1-2 months.  Reports hemorrhoids have been protruding from anus.  Denies significant pain, wrist feels uncomfortable, but niece reports that she has appeared uncomfortable and has been reporting pain.  Has had some bleeding intermittently over the past month.  Denies any lightheadedness or dizziness, but has had some fatigue.  Denies significant history of hemorrhoids.  Using over-the-counter Preparation H once or twice.  HPI  History reviewed. No pertinent past medical history.  Patient Active Problem List   Diagnosis Date Noted  . BMI 22.0-22.9, adult 05/15/2020  . CKD (chronic kidney disease) stage 3, GFR 30-59 ml/min (HCC) 05/15/2020  . Labile hypertension 06/16/2018  . FHx: heart disease 06/16/2018  . Former smoker 06/16/2018  . Abnormal glucose 06/16/2018  . Anxiety 03/04/2018  . Medication management 04/08/2014  . Vitamin D deficiency 07/01/2013  . Hyperlipidemia, mixed 07/01/2013  . LBBB (left bundle branch block) 02/20/2010    Past Surgical History:  Procedure Laterality Date  . GLAUCOMA SURGERY  2013  . TONSILLECTOMY      OB History   No obstetric history on file.      Home Medications    Prior to Admission medications   Medication Sig Start Date End Date Taking? Authorizing Provider  cholecalciferol (VITAMIN D3) 25 MCG (1000 UT) tablet Take 2,000 Units by mouth daily.    Yes [provider]  hydrocortisone (ANUSOL-HC) 2.5 % rectal cream Place 1 application rectally 2 (two) times daily. 08/23/20  Yes Apolonio Cutting C, PA-C  hydrocortisone (ANUSOL-HC) 25 MG suppository Place 1 suppository (25 mg total) rectally 2  (two) times daily. 08/23/20  Yes Breiona Couvillon C, PA-C  SENNA PO Take by mouth daily.   Yes [provider]  latanoprost (XALATAN) 0.005 % ophthalmic solution  08/05/16   [provider]  LORazepam (ATIVAN) 0.5 MG tablet Take 1/2 to 1 tablet      at Bedtime      ONLY       if needed for Sleep & please try to limit to 5 days /week to avoid Addiction & Dementia 07/03/20   Lucky Cowboy, MD    Family History Family History  Problem Relation Age of Onset  . Heart attack Brother   . Heart disease Brother   . Heart attack Brother   . Heart disease Brother   . Hypertension Mother   . Stroke Mother   . Heart attack Father   . Diabetes Father     Social History Social History   Tobacco Use  . Smoking status: Former Smoker    Quit date: 07/16/1979    Years since quitting: 41.1  . Smokeless tobacco: Never Used  Vaping Use  . Vaping Use: Never used  Substance Use Topics  . Alcohol use: No    Alcohol/week: 0.0 standard drinks  . Drug use: No     Allergies   Adhesive [tape], Advil [ibuprofen], and Atenolol   Review of Systems Review of Systems  Constitutional: Negative for fatigue and fever.  Eyes: Negative for visual disturbance.  Respiratory: Negative for shortness of breath.   Cardiovascular: Negative for chest  pain.  Gastrointestinal: Positive for rectal pain. Negative for abdominal pain, nausea and vomiting.  Musculoskeletal: Negative for arthralgias and joint swelling.  Skin: Negative for color change, rash and wound.  Neurological: Negative for dizziness, weakness, light-headedness and headaches.     Physical Exam Triage Vital Signs ED Triage Vitals  Enc Vitals Group     BP      Pulse      Resp      Temp      Temp src      SpO2      Weight      Height      Head Circumference      Peak Flow      Pain Score      Pain Loc      Pain Edu?      Excl. in GC?    No data found.  Updated Vital Signs BP (!) 149/79 (BP Location: Left Arm)    Pulse (!) 103   Temp 98.2 F (36.8 C) (Oral)   Resp 17   SpO2 96%   Visual Acuity Right Eye Distance:   Left Eye Distance:   Bilateral Distance:    Right Eye Near:   Left Eye Near:    Bilateral Near:     Physical Exam Vitals and nursing note reviewed.  Constitutional:      Appearance: She is well-developed and well-nourished.     Comments: No acute distress  HENT:     Head: Normocephalic and atraumatic.     Nose: Nose normal.  Eyes:     Conjunctiva/sclera: Conjunctivae normal.  Cardiovascular:     Rate and Rhythm: Normal rate.  Pulmonary:     Effort: Pulmonary effort is normal. No respiratory distress.  Abdominal:     General: There is no distension.  Genitourinary:    Comments: External rectum area of swelling without significant erythema or induration, nontender to palpation, discomfort on DRE, visible blood noted on glove, but no blood noted on exam with visualization Musculoskeletal:        General: Normal range of motion.     Cervical back: Neck supple.  Skin:    General: Skin is warm and dry.  Neurological:     Mental Status: She is alert and oriented to person, place, and time.  Psychiatric:        Mood and Affect: Mood and affect normal.      UC Treatments / Results  Labs (all labs ordered are listed, but only abnormal results are displayed) Labs Reviewed  CBC    EKG   Radiology No results found.  Procedures Procedures (including critical care time)  Medications Ordered in UC Medications - No data to display  Initial Impression / Assessment and Plan / UC Course  I have reviewed the triage vital signs and the nursing notes.  Pertinent labs & imaging results that were available during my care of the patient were reviewed by me and considered in my medical decision making (see chart for details).     Patient appears to have prior external hemorrhoids that are not actively engorged/inflammed, concern for internal hemorrhoids at this time,  providing Anusol suppositories and rectal cream continue sitz bath and warm compresses.  Checking hemoglobin given symptoms greater than 1 month to ensure stable, will call with results.  Monitor symptoms with recommendations today for the next 4 to 5 days, contact surgery Monday morning if not having any improvement or persistent discomfort.  Discussed strict return  precautions. Patient verbalized understanding and is agreeable with plan.  Final Clinical Impressions(s) / UC Diagnoses   Final diagnoses:  Hemorrhoids without complication  Rectal bleeding     Discharge Instructions     Please use suppositories/cream twice daily over the next 5 days Continue sitz bath's twice daily Soak warm black/green tea to rectum 2-3 time daily x 10 min Follow up with surgery if persisting - contact belowe     ED Prescriptions    Medication Sig Dispense Auth. Provider   hydrocortisone (ANUSOL-HC) 25 MG suppository Place 1 suppository (25 mg total) rectally 2 (two) times daily. 12 suppository Lizvet Chunn C, PA-C   hydrocortisone (ANUSOL-HC) 2.5 % rectal cream Place 1 application rectally 2 (two) times daily. 30 g Jazariah Teall, [place] C, PA-C     PDMP not reviewed this encounter.   Lew Dawes, [place] 08/23/20 [phone]

## 2020-08-29 ENCOUNTER — Other Ambulatory Visit: Payer: Self-pay

## 2020-08-29 ENCOUNTER — Ambulatory Visit (INDEPENDENT_AMBULATORY_CARE_PROVIDER_SITE_OTHER): Payer: Medicare Other | Admitting: Internal Medicine

## 2020-08-29 ENCOUNTER — Telehealth: Payer: Self-pay | Admitting: *Deleted

## 2020-08-29 ENCOUNTER — Encounter: Payer: Self-pay | Admitting: Internal Medicine

## 2020-08-29 VITALS — BP 106/74 | HR 60 | Temp 97.0°F | Resp 16 | Ht 62.0 in | Wt 106.0 lb

## 2020-08-29 DIAGNOSIS — R0989 Other specified symptoms and signs involving the circulatory and respiratory systems: Secondary | ICD-10-CM | POA: Diagnosis not present

## 2020-08-29 DIAGNOSIS — R7309 Other abnormal glucose: Secondary | ICD-10-CM

## 2020-08-29 DIAGNOSIS — E559 Vitamin D deficiency, unspecified: Secondary | ICD-10-CM | POA: Diagnosis not present

## 2020-08-29 DIAGNOSIS — E782 Mixed hyperlipidemia: Secondary | ICD-10-CM | POA: Diagnosis not present

## 2020-08-29 DIAGNOSIS — Z79899 Other long term (current) drug therapy: Secondary | ICD-10-CM | POA: Diagnosis not present

## 2020-08-29 NOTE — Telephone Encounter (Signed)
Returned call to son regarding the patient. He states the patient is having muscle and rib pain, but no fall. States she has been in bed x 2 days, except for being up to restroom. Patient declines any type of pain medication. She was seen in ED on 08/23/2020 for hemorrhoids and has suppositories. Dr Oneta Rack advised the patient take Tylenol 1000 mg 4 times a day. Son will encourage patient to take medication and patient has OV with Dr Oneta Rack 08/29/2020.

## 2020-08-29 NOTE — Patient Instructions (Signed)

## 2020-08-29 NOTE — Progress Notes (Signed)
History of Present Illness:       This very nice 87 y.o.  WWF presents for 3 month follow up with HTN, HLD, Pre-Diabetes and Vitamin D Deficiency.       Patient is treated for HTN (1980) & BP has been controlled at home. Today's  . Patient has had no complaints of any cardiac type chest pain, palpitations, dyspnea / orthopnea / PND, dizziness, claudication, or dependent edema.      Hyperlipidemia is controlled with diet & meds. Patient denies myalgias or other med SE's. Last Lipids were at goal:  Lab Results  Component Value Date   CHOL 171 05/17/2020   HDL 49 (L) 05/17/2020   LDLCALC 100 (H) 05/17/2020   TRIG 122 05/17/2020   CHOLHDL 3.5 05/17/2020    Also, the patient has history of PreDiabetes (A1c 5.7% /2012) and has had no symptoms of reactive hypoglycemia, diabetic polys, paresthesias or visual blurring.  Last A1c was at goal:  Lab Results  Component Value Date   HGBA1C 5.6 02/02/2020       Further, the patient also has history of Vitamin D Deficiency and supplements vitamin D without any suspected side-effects. Last vitamin D was near goal (&0-100):  Lab Results  Component Value Date   VD25OH 58 02/02/2020    Current Outpatient Medications on File Prior to Visit  Medication Sig  . VITAMIN D  2,000 Units  Take daily.   . ANUSOL-HC 2.5 % rectal cream Place 1 application rectally 2 times daily.  . ANUSOL-HC 25 MG suppository Place 1 suppository  rectally 2  times daily.  Marland Kitchen XALATAN  0.005 % ophth solution   . LORazepam  0.5 MG tablet Take 1/2 to 1 tablet  At bedtime    . SENNA PO Take by mouth daily.    Allergies  Allergen Reactions  . Adhesive [Tape]   . Advil [Ibuprofen]   . Atenolol     Immunization History  Administered Date(s) Administered  . DTaP 05/06/2003  . PFIZER(Purple Top)SARS-COV-2 Vaccination 09/22/2019, 10/13/2019  . Pneumococcal Polysaccharide-23 05/06/2003  . Zoster 07/16/2007    Past Surgical History:  Procedure Laterality Date   . GLAUCOMA SURGERY  2013  . TONSILLECTOMY      FHx:    Reviewed / unchanged  SHx:    Reviewed / unchanged   Systems Review:  Constitutional: Denies fever, chills, wt changes, headaches, insomnia, fatigue, night sweats, change in appetite. Eyes: Denies redness, blurred vision, diplopia, discharge, itchy, watery eyes.  ENT: Denies discharge, congestion, post nasal drip, epistaxis, sore throat, earache, hearing loss, dental pain, tinnitus, vertigo, sinus pain, snoring.  CV: Denies chest pain, palpitations, irregular heartbeat, syncope, dyspnea, diaphoresis, orthopnea, PND, claudication or edema. Respiratory: denies cough, dyspnea, DOE, pleurisy, hoarseness, laryngitis, wheezing.  Gastrointestinal: Denies dysphagia, odynophagia, heartburn, reflux, water brash, abdominal pain or cramps, nausea, vomiting, bloating, diarrhea, constipation, hematemesis, melena, hematochezia  or hemorrhoids. Genitourinary: Denies dysuria, frequency, urgency, nocturia, hesitancy, discharge, hematuria or flank pain. Musculoskeletal: Denies arthralgias, myalgias, stiffness, jt. swelling, pain, limping or strain/sprain.  Skin: Denies pruritus, rash, hives, warts, acne, eczema or change in skin lesion(s). Neuro: No weakness, tremor, incoordination, spasms, paresthesia or pain. Psychiatric: Denies confusion, memory loss or sensory loss. Endo: Denies change in weight, skin or hair change.  Heme/Lymph: No excessive bleeding, bruising or enlarged lymph nodes.  Physical Exam  There were no vitals taken for this visit.  Appears  well nourished, well groomed  and in no  distress.  Eyes: PERRLA, EOMs, conjunctiva no swelling or erythema. Sinuses: No frontal/maxillary tenderness ENT/Mouth: EAC's clear, TM's nl w/o erythema, bulging. Nares clear w/o erythema, swelling, exudates. Oropharynx clear without erythema or exudates. Oral hygiene is good. Tongue normal, non obstructing. Hearing intact.  Neck: Supple. Thyroid not  palpable. Car 2+/2+ without bruits, nodes or JVD. Chest: Respirations nl with BS clear & equal w/o rales, rhonchi, wheezing or stridor.  Cor: Heart sounds normal w/ regular rate and rhythm without sig. murmurs, gallops, clicks or rubs. Peripheral pulses normal and equal  without edema.  Abdomen: Soft & bowel sounds normal. Non-tender w/o guarding, rebound, hernias, masses or organomegaly.  Lymphatics: Unremarkable.  Musculoskeletal: Full ROM all peripheral extremities, joint stability, 5/5 strength and normal gait.  Skin: Warm, dry without exposed rashes, lesions or ecchymosis apparent.  Neuro: Cranial nerves intact, reflexes equal bilaterally. Sensory-motor testing grossly intact. Tendon reflexes grossly intact.  Pysch: Alert & oriented x 3.  Insight and judgement nl & appropriate. No ideations.  Assessment and Plan:  1. Labile hypertension  - Continue medication, monitor blood pressure at home.  - Continue DASH diet.  Reminder to go to the ER if any CP,  SOB, nausea, dizziness, severe HA, changes vision/speech.  - CBC with Differential/Platelet - COMPLETE METABOLIC PANEL WITH GFR - Magnesium - TSH  2. Hyperlipidemia, mixed  - Continue diet/meds, exercise,& lifestyle modifications.  - Continue monitor periodic cholesterol/liver & renal functions   - TSH  3. Abnormal glucose  - Continue diet, exercise  - Lifestyle modifications.  - Monitor appropriate labs.  - Hemoglobin A1c  4. Vitamin D deficiency  - Continue supplementation.  - VITAMIN D 25 Hydroxy   5. Medication management  - CBC with Differential/Platelet - COMPLETE METABOLIC PANEL WITH GFR - Magnesium  - TSH - Hemoglobin A1c - VITAMIN D 25 Hydroxy        Discussed  regular exercise, BP monitoring, weight control to achieve/maintain BMI less than 25 and discussed med and SE's. Recommended labs to assess and monitor clinical status with further disposition pending results of labs.  I discussed the assessment  and treatment plan with the patient. The patient was provided an opportunity to ask questions and all were answered. The patient agreed with the plan and demonstrated an understanding of the instructions.  I provided over 30 minutes of exam, counseling, chart review and  complex critical decision making.        The patient was advised to call back or seek an in-person evaluation if the symptoms worsen or if the condition fails to improve as anticipated.   Marinus Maw, MD

## 2020-08-30 LAB — CBC WITH DIFFERENTIAL/PLATELET
Absolute Monocytes: 691 cells/uL (ref 200–950)
Basophils Absolute: 19 cells/uL (ref 0–200)
Basophils Relative: 0.2 %
Eosinophils Absolute: 19 cells/uL (ref 15–500)
Eosinophils Relative: 0.2 %
HCT: 42.2 % (ref 35.0–45.0)
Hemoglobin: 14.4 g/dL (ref 11.7–15.5)
Lymphs Abs: 643 cells/uL — ABNORMAL LOW (ref 850–3900)
MCH: 29.6 pg (ref 27.0–33.0)
MCHC: 34.1 g/dL (ref 32.0–36.0)
MCV: 86.7 fL (ref 80.0–100.0)
MPV: 9.8 fL (ref 7.5–12.5)
Monocytes Relative: 7.2 %
Neutro Abs: 8227 cells/uL — ABNORMAL HIGH (ref 1500–7800)
Neutrophils Relative %: 85.7 %
Platelets: 495 10*3/uL — ABNORMAL HIGH (ref 140–400)
RBC: 4.87 10*6/uL (ref 3.80–5.10)
RDW: 13.7 % (ref 11.0–15.0)
Total Lymphocyte: 6.7 %
WBC: 9.6 10*3/uL (ref 3.8–10.8)

## 2020-08-30 LAB — HEMOGLOBIN A1C
Hgb A1c MFr Bld: 5.7 % of total Hgb — ABNORMAL HIGH (ref <5.7)
Mean Plasma Glucose: 117 mg/dL
eAG (mmol/L): 6.5 mmol/L

## 2020-08-30 LAB — COMPLETE METABOLIC PANEL WITH GFR
AG Ratio: 1.4 (calc) (ref 1.0–2.5)
ALT: 24 U/L (ref 6–29)
AST: 19 U/L (ref 10–35)
Albumin: 3.6 g/dL (ref 3.6–5.1)
Alkaline phosphatase (APISO): 66 U/L (ref 37–153)
BUN/Creatinine Ratio: 32 (calc) — ABNORMAL HIGH (ref 6–22)
BUN: 30 mg/dL — ABNORMAL HIGH (ref 7–25)
CO2: 30 mmol/L (ref 20–32)
Calcium: 9.8 mg/dL (ref 8.6–10.4)
Chloride: 99 mmol/L (ref 98–110)
Creat: 0.95 mg/dL — ABNORMAL HIGH (ref 0.60–0.88)
GFR, Est African American: 60 mL/min/{1.73_m2} (ref >=60)
GFR, Est Non African American: 52 mL/min/{1.73_m2} — ABNORMAL LOW (ref >=60)
Globulin: 2.6 g/dL (calc) (ref 1.9–3.7)
Glucose, Bld: 132 mg/dL — ABNORMAL HIGH (ref 65–99)
Potassium: 3.8 mmol/L (ref 3.5–5.3)
Sodium: 139 mmol/L (ref 135–146)
Total Bilirubin: 0.8 mg/dL (ref 0.2–1.2)
Total Protein: 6.2 g/dL (ref 6.1–8.1)

## 2020-08-30 LAB — MAGNESIUM: Magnesium: 2.2 mg/dL (ref 1.5–2.5)

## 2020-08-30 LAB — TSH: TSH: 2.42 mIU/L (ref 0.40–4.50)

## 2020-08-30 LAB — VITAMIN D 25 HYDROXY (VIT D DEFICIENCY, FRACTURES): Vit D, 25-Hydroxy: 48 ng/mL (ref 30–100)

## 2020-08-30 NOTE — Progress Notes (Signed)
========================================================== ==========================================================  -    Labs show Normal CBC   -  A1c is Normal - No Diabetes  - Vitamin D = 48 - sl Low - Goal is between 70-100,                                                          So Increase your Vit D 2,000 unit caps                                                           Up to 2 caps or 4,000 units  /day ========================================================== ==========================================================  - Blood Chemistries suggest mild Dehydration, so                                                         Very Important to drink lots more fluids   - All Else - Electrolytes - Liver - Magnesium & Thyroid    - all  Normal / OK ===========================================================  =========================================================== ===========================================================

## 2020-09-04 ENCOUNTER — Other Ambulatory Visit: Payer: Self-pay | Admitting: Internal Medicine

## 2020-09-04 MED ORDER — LORAZEPAM 0.5 MG PO TABS: ORAL_TABLET | ORAL | 0 refills | Status: DC

## 2020-10-01 ENCOUNTER — Observation Stay (HOSPITAL_COMMUNITY): Payer: Medicare Other

## 2020-10-01 ENCOUNTER — Inpatient Hospital Stay (HOSPITAL_COMMUNITY)
Admission: EM | Admit: 2020-10-01 | Discharge: 2020-10-03 | DRG: 064 | Disposition: A | Discharge status: Rehabilitation Facility | Payer: Medicare Other | Attending: Internal Medicine | Admitting: Internal Medicine

## 2020-10-01 ENCOUNTER — Emergency Department (HOSPITAL_COMMUNITY): Payer: Medicare Other

## 2020-10-01 ENCOUNTER — Encounter (HOSPITAL_COMMUNITY): Payer: Self-pay

## 2020-10-01 ENCOUNTER — Other Ambulatory Visit: Payer: Self-pay

## 2020-10-01 DIAGNOSIS — Z886 Allergy status to analgesic agent status: Secondary | ICD-10-CM

## 2020-10-01 DIAGNOSIS — R471 Dysarthria and anarthria: Secondary | ICD-10-CM | POA: Diagnosis present

## 2020-10-01 DIAGNOSIS — E871 Hypo-osmolality and hyponatremia: Secondary | ICD-10-CM | POA: Diagnosis present

## 2020-10-01 DIAGNOSIS — R7303 Prediabetes: Secondary | ICD-10-CM | POA: Diagnosis not present

## 2020-10-01 DIAGNOSIS — G510 Bell's palsy: Secondary | ICD-10-CM | POA: Diagnosis present

## 2020-10-01 DIAGNOSIS — I6340 Cerebral infarction due to embolism of unspecified cerebral artery: Secondary | ICD-10-CM | POA: Diagnosis not present

## 2020-10-01 DIAGNOSIS — Z888 Allergy status to other drugs, medicaments and biological substances status: Secondary | ICD-10-CM

## 2020-10-01 DIAGNOSIS — E782 Mixed hyperlipidemia: Secondary | ICD-10-CM | POA: Diagnosis present

## 2020-10-01 DIAGNOSIS — I10 Essential (primary) hypertension: Secondary | ICD-10-CM | POA: Diagnosis not present

## 2020-10-01 DIAGNOSIS — E1122 Type 2 diabetes mellitus with diabetic chronic kidney disease: Secondary | ICD-10-CM | POA: Diagnosis present

## 2020-10-01 DIAGNOSIS — G8194 Hemiplegia, unspecified affecting left nondominant side: Secondary | ICD-10-CM | POA: Diagnosis present

## 2020-10-01 DIAGNOSIS — I639 Cerebral infarction, unspecified: Principal | ICD-10-CM | POA: Diagnosis present

## 2020-10-01 DIAGNOSIS — I447 Left bundle-branch block, unspecified: Secondary | ICD-10-CM | POA: Diagnosis not present

## 2020-10-01 DIAGNOSIS — G319 Degenerative disease of nervous system, unspecified: Secondary | ICD-10-CM | POA: Diagnosis not present

## 2020-10-01 DIAGNOSIS — N390 Urinary tract infection, site not specified: Secondary | ICD-10-CM | POA: Diagnosis present

## 2020-10-01 DIAGNOSIS — E559 Vitamin D deficiency, unspecified: Secondary | ICD-10-CM | POA: Diagnosis present

## 2020-10-01 DIAGNOSIS — I130 Hypertensive heart and chronic kidney disease with heart failure and stage 1 through stage 4 chronic kidney disease, or unspecified chronic kidney disease: Secondary | ICD-10-CM | POA: Diagnosis present

## 2020-10-01 DIAGNOSIS — I429 Cardiomyopathy, unspecified: Secondary | ICD-10-CM | POA: Diagnosis present

## 2020-10-01 DIAGNOSIS — Z66 Do not resuscitate: Secondary | ICD-10-CM | POA: Diagnosis present

## 2020-10-01 DIAGNOSIS — D62 Acute posthemorrhagic anemia: Secondary | ICD-10-CM | POA: Diagnosis not present

## 2020-10-01 DIAGNOSIS — G459 Transient cerebral ischemic attack, unspecified: Secondary | ICD-10-CM

## 2020-10-01 DIAGNOSIS — H748X1 Other specified disorders of right middle ear and mastoid: Secondary | ICD-10-CM | POA: Diagnosis not present

## 2020-10-01 DIAGNOSIS — R531 Weakness: Secondary | ICD-10-CM | POA: Diagnosis not present

## 2020-10-01 DIAGNOSIS — Z20822 Contact with and (suspected) exposure to covid-19: Secondary | ICD-10-CM | POA: Diagnosis present

## 2020-10-01 DIAGNOSIS — F411 Generalized anxiety disorder: Secondary | ICD-10-CM | POA: Diagnosis not present

## 2020-10-01 DIAGNOSIS — J439 Emphysema, unspecified: Secondary | ICD-10-CM | POA: Diagnosis present

## 2020-10-01 DIAGNOSIS — I472 Ventricular tachycardia: Secondary | ICD-10-CM | POA: Diagnosis present

## 2020-10-01 DIAGNOSIS — N1831 Chronic kidney disease, stage 3a: Secondary | ICD-10-CM | POA: Diagnosis present

## 2020-10-01 DIAGNOSIS — I6782 Cerebral ischemia: Secondary | ICD-10-CM | POA: Diagnosis not present

## 2020-10-01 DIAGNOSIS — E785 Hyperlipidemia, unspecified: Secondary | ICD-10-CM | POA: Diagnosis present

## 2020-10-01 DIAGNOSIS — D75839 Thrombocytosis, unspecified: Secondary | ICD-10-CM | POA: Diagnosis present

## 2020-10-01 DIAGNOSIS — R29701 NIHSS score 1: Secondary | ICD-10-CM | POA: Diagnosis present

## 2020-10-01 DIAGNOSIS — E1165 Type 2 diabetes mellitus with hyperglycemia: Secondary | ICD-10-CM | POA: Diagnosis not present

## 2020-10-01 DIAGNOSIS — I6930 Unspecified sequelae of cerebral infarction: Secondary | ICD-10-CM | POA: Diagnosis not present

## 2020-10-01 DIAGNOSIS — A499 Bacterial infection, unspecified: Secondary | ICD-10-CM | POA: Diagnosis not present

## 2020-10-01 DIAGNOSIS — E042 Nontoxic multinodular goiter: Secondary | ICD-10-CM | POA: Diagnosis present

## 2020-10-01 DIAGNOSIS — I517 Cardiomegaly: Secondary | ICD-10-CM | POA: Diagnosis not present

## 2020-10-01 DIAGNOSIS — F419 Anxiety disorder, unspecified: Secondary | ICD-10-CM | POA: Diagnosis present

## 2020-10-01 DIAGNOSIS — Z833 Family history of diabetes mellitus: Secondary | ICD-10-CM

## 2020-10-01 DIAGNOSIS — E890 Postprocedural hypothyroidism: Secondary | ICD-10-CM | POA: Diagnosis present

## 2020-10-01 DIAGNOSIS — I5021 Acute systolic (congestive) heart failure: Secondary | ICD-10-CM | POA: Diagnosis present

## 2020-10-01 DIAGNOSIS — Z87891 Personal history of nicotine dependence: Secondary | ICD-10-CM

## 2020-10-01 DIAGNOSIS — B9689 Other specified bacterial agents as the cause of diseases classified elsewhere: Secondary | ICD-10-CM | POA: Diagnosis present

## 2020-10-01 DIAGNOSIS — G8929 Other chronic pain: Secondary | ICD-10-CM | POA: Diagnosis not present

## 2020-10-01 DIAGNOSIS — R29818 Other symptoms and signs involving the nervous system: Secondary | ICD-10-CM | POA: Diagnosis not present

## 2020-10-01 DIAGNOSIS — E46 Unspecified protein-calorie malnutrition: Secondary | ICD-10-CM | POA: Diagnosis not present

## 2020-10-01 DIAGNOSIS — I700 Atherosclerosis of aorta: Secondary | ICD-10-CM | POA: Diagnosis present

## 2020-10-01 DIAGNOSIS — I5042 Chronic combined systolic (congestive) and diastolic (congestive) heart failure: Secondary | ICD-10-CM | POA: Diagnosis not present

## 2020-10-01 DIAGNOSIS — E8809 Other disorders of plasma-protein metabolism, not elsewhere classified: Secondary | ICD-10-CM | POA: Diagnosis not present

## 2020-10-01 DIAGNOSIS — I6310 Cerebral infarction due to embolism of unspecified precerebral artery: Secondary | ICD-10-CM | POA: Diagnosis not present

## 2020-10-01 DIAGNOSIS — Z8249 Family history of ischemic heart disease and other diseases of the circulatory system: Secondary | ICD-10-CM | POA: Diagnosis not present

## 2020-10-01 DIAGNOSIS — R9431 Abnormal electrocardiogram [ECG] [EKG]: Secondary | ICD-10-CM | POA: Diagnosis not present

## 2020-10-01 DIAGNOSIS — Z823 Family history of stroke: Secondary | ICD-10-CM

## 2020-10-01 DIAGNOSIS — R0989 Other specified symptoms and signs involving the circulatory and respiratory systems: Secondary | ICD-10-CM | POA: Diagnosis not present

## 2020-10-01 DIAGNOSIS — H9190 Unspecified hearing loss, unspecified ear: Secondary | ICD-10-CM | POA: Diagnosis present

## 2020-10-01 DIAGNOSIS — H409 Unspecified glaucoma: Secondary | ICD-10-CM | POA: Diagnosis present

## 2020-10-01 DIAGNOSIS — K5901 Slow transit constipation: Secondary | ICD-10-CM | POA: Diagnosis not present

## 2020-10-01 DIAGNOSIS — N183 Chronic kidney disease, stage 3 unspecified: Secondary | ICD-10-CM | POA: Diagnosis present

## 2020-10-01 DIAGNOSIS — Z91048 Other nonmedicinal substance allergy status: Secondary | ICD-10-CM | POA: Diagnosis not present

## 2020-10-01 DIAGNOSIS — I6389 Other cerebral infarction: Secondary | ICD-10-CM | POA: Diagnosis not present

## 2020-10-01 DIAGNOSIS — R29810 Facial weakness: Secondary | ICD-10-CM | POA: Diagnosis not present

## 2020-10-01 DIAGNOSIS — R4781 Slurred speech: Secondary | ICD-10-CM | POA: Diagnosis not present

## 2020-10-01 DIAGNOSIS — D473 Essential (hemorrhagic) thrombocythemia: Secondary | ICD-10-CM | POA: Diagnosis present

## 2020-10-01 DIAGNOSIS — Z8673 Personal history of transient ischemic attack (TIA), and cerebral infarction without residual deficits: Secondary | ICD-10-CM | POA: Diagnosis present

## 2020-10-01 DIAGNOSIS — Z79899 Other long term (current) drug therapy: Secondary | ICD-10-CM

## 2020-10-01 DIAGNOSIS — I6523 Occlusion and stenosis of bilateral carotid arteries: Secondary | ICD-10-CM | POA: Diagnosis not present

## 2020-10-01 DIAGNOSIS — M545 Low back pain, unspecified: Secondary | ICD-10-CM | POA: Diagnosis not present

## 2020-10-01 HISTORY — DX: Hyperglycemia, unspecified: R73.9

## 2020-10-01 HISTORY — DX: Vitamin D deficiency, unspecified: E55.9

## 2020-10-01 HISTORY — DX: Unspecified hearing loss, unspecified ear: H91.90

## 2020-10-01 HISTORY — DX: Anxiety disorder, unspecified: F41.9

## 2020-10-01 HISTORY — DX: Essential (primary) hypertension: I10

## 2020-10-01 HISTORY — DX: Type 2 diabetes mellitus without complications: E11.9

## 2020-10-01 LAB — DIFFERENTIAL
Abs Immature Granulocytes: 0.05 10*3/uL (ref 0.00–0.07)
Basophils Absolute: 0.1 10*3/uL (ref 0.0–0.1)
Basophils Relative: 1 %
Eosinophils Absolute: 0.1 10*3/uL (ref 0.0–0.5)
Eosinophils Relative: 1 %
Immature Granulocytes: 1 %
Lymphocytes Relative: 9 %
Lymphs Abs: 0.8 10*3/uL (ref 0.7–4.0)
Monocytes Absolute: 1 10*3/uL (ref 0.1–1.0)
Monocytes Relative: 11 %
Neutro Abs: 7 10*3/uL (ref 1.7–7.7)
Neutrophils Relative %: 77 %

## 2020-10-01 LAB — CBC
HCT: 37.2 % (ref 36.0–46.0)
Hemoglobin: 12.3 g/dL (ref 12.0–15.0)
MCH: 29.1 pg (ref 26.0–34.0)
MCHC: 33.1 g/dL (ref 30.0–36.0)
MCV: 88.2 fL (ref 80.0–100.0)
Platelets: 444 10*3/uL — ABNORMAL HIGH (ref 150–400)
RBC: 4.22 MIL/uL (ref 3.87–5.11)
RDW: 15.4 % (ref 11.5–15.5)
WBC: 8.9 10*3/uL (ref 4.0–10.5)
nRBC: 0 % (ref 0.0–0.2)

## 2020-10-01 LAB — URINALYSIS, ROUTINE W REFLEX MICROSCOPIC
Bilirubin Urine: NEGATIVE
Glucose, UA: NEGATIVE mg/dL
Ketones, ur: NEGATIVE mg/dL
Nitrite: POSITIVE — AB
Protein, ur: NEGATIVE mg/dL
Specific Gravity, Urine: >1.046 — ABNORMAL HIGH (ref 1.005–1.030)
WBC, UA: >50 WBC/hpf — ABNORMAL HIGH (ref 0–5)
pH: 6 (ref 5.0–8.0)

## 2020-10-01 LAB — COMPREHENSIVE METABOLIC PANEL
ALT: 15 U/L (ref 0–44)
AST: 17 U/L (ref 15–41)
Albumin: 3.1 g/dL — ABNORMAL LOW (ref 3.5–5.0)
Alkaline Phosphatase: 60 U/L (ref 38–126)
Anion gap: 7 (ref 5–15)
BUN: 19 mg/dL (ref 8–23)
CO2: 25 mmol/L (ref 22–32)
Calcium: 9.5 mg/dL (ref 8.9–10.3)
Chloride: 100 mmol/L (ref 98–111)
Creatinine, Ser: 0.88 mg/dL (ref 0.44–1.00)
GFR, Estimated: >60 mL/min (ref >60)
Glucose, Bld: 108 mg/dL — ABNORMAL HIGH (ref 70–99)
Potassium: 3.8 mmol/L (ref 3.5–5.1)
Sodium: 132 mmol/L — ABNORMAL LOW (ref 135–145)
Total Bilirubin: 1.3 mg/dL — ABNORMAL HIGH (ref 0.3–1.2)
Total Protein: 6.6 g/dL (ref 6.5–8.1)

## 2020-10-01 LAB — CBG MONITORING, ED
Glucose-Capillary: 87 mg/dL (ref 70–99)
Glucose-Capillary: 105 mg/dL — ABNORMAL HIGH (ref 70–99)

## 2020-10-01 LAB — I-STAT CHEM 8, ED
BUN: 21 mg/dL (ref 8–23)
Calcium, Ion: 1.32 mmol/L (ref 1.15–1.40)
Chloride: 102 mmol/L (ref 98–111)
Creatinine, Ser: 0.8 mg/dL (ref 0.44–1.00)
Glucose, Bld: 107 mg/dL — ABNORMAL HIGH (ref 70–99)
HCT: 38 % (ref 36.0–46.0)
Hemoglobin: 12.9 g/dL (ref 12.0–15.0)
Potassium: 3.9 mmol/L (ref 3.5–5.1)
Sodium: 138 mmol/L (ref 135–145)
TCO2: 27 mmol/L (ref 22–32)

## 2020-10-01 LAB — CK: Total CK: 55 U/L (ref 38–234)

## 2020-10-01 LAB — LIPID PANEL
Cholesterol: 135 mg/dL (ref 0–200)
HDL: 47 mg/dL (ref >40)
LDL Cholesterol: 78 mg/dL (ref 0–99)
Total CHOL/HDL Ratio: 2.9 RATIO
Triglycerides: 51 mg/dL (ref <150)
VLDL: 10 mg/dL (ref 0–40)

## 2020-10-01 LAB — RESP PANEL BY RT-PCR (FLU A&B, COVID) ARPGX2
Influenza A by PCR: NEGATIVE
Influenza B by PCR: NEGATIVE
SARS Coronavirus 2 by RT PCR: NEGATIVE

## 2020-10-01 LAB — PROTIME-INR
INR: 1.1 (ref 0.8–1.2)
Prothrombin Time: 13.4 seconds (ref 11.4–15.2)

## 2020-10-01 LAB — LACTIC ACID, PLASMA: Lactic Acid, Venous: 0.9 mmol/L (ref 0.5–1.9)

## 2020-10-01 LAB — APTT: aPTT: 25 seconds (ref 24–36)

## 2020-10-01 MED ORDER — SENNOSIDES-DOCUSATE SODIUM 8.6-50 MG PO TABS: 1.0000 | ORAL_TABLET | Freq: Every evening | ORAL | Status: DC | PRN

## 2020-10-01 MED ORDER — ASPIRIN 81 MG PO CHEW: 81.0000 mg | CHEWABLE_TABLET | Freq: Every day | ORAL | Status: DC

## 2020-10-01 MED ORDER — ACETAMINOPHEN 325 MG PO TABS
650.0000 mg | ORAL_TABLET | ORAL | Status: DC | PRN
  Administered 2020-10-03: 650 mg via ORAL
  Filled 2020-10-01: qty 2

## 2020-10-01 MED ORDER — SODIUM CHLORIDE 0.9 % IV SOLN: Freq: Once | INTRAVENOUS | Status: AC

## 2020-10-01 MED ORDER — ENOXAPARIN SODIUM 40 MG/0.4ML ~~LOC~~ SOLN
40.0000 mg | SUBCUTANEOUS | Status: DC
  Administered 2020-10-01 – 2020-10-02 (×2): 40 mg via SUBCUTANEOUS
  Filled 2020-10-01 (×2): qty 0.4

## 2020-10-01 MED ORDER — LORAZEPAM 2 MG/ML IJ SOLN: 0.5000 mg | Freq: Once | INTRAMUSCULAR | Status: DC | PRN

## 2020-10-01 MED ORDER — CLOPIDOGREL BISULFATE 75 MG PO TABS
75.0000 mg | ORAL_TABLET | Freq: Every day | ORAL | Status: DC
  Administered 2020-10-02 – 2020-10-03 (×2): 75 mg via ORAL
  Filled 2020-10-01 (×2): qty 1

## 2020-10-01 MED ORDER — ASPIRIN 300 MG RE SUPP: 300.0000 mg | Freq: Every day | RECTAL | Status: DC

## 2020-10-01 MED ORDER — SODIUM CHLORIDE 0.9% FLUSH
3.0000 mL | Freq: Once | INTRAVENOUS | Status: AC
  Administered 2020-10-01: 3 mL via INTRAVENOUS

## 2020-10-01 MED ORDER — HYDRALAZINE HCL 20 MG/ML IJ SOLN: 10.0000 mg | INTRAMUSCULAR | Status: DC | PRN

## 2020-10-01 MED ORDER — ASPIRIN 300 MG RE SUPP
300.0000 mg | Freq: Once | RECTAL | Status: AC
  Administered 2020-10-01: 300 mg via RECTAL

## 2020-10-01 MED ORDER — ACETAMINOPHEN 650 MG RE SUPP: 650.0000 mg | RECTAL | Status: DC | PRN

## 2020-10-01 MED ORDER — IOHEXOL 350 MG/ML SOLN
75.0000 mL | Freq: Once | INTRAVENOUS | Status: AC | PRN
  Administered 2020-10-01: 75 mL via INTRAVENOUS

## 2020-10-01 MED ORDER — ACETAMINOPHEN 160 MG/5ML PO SOLN: 650.0000 mg | ORAL | Status: DC | PRN

## 2020-10-01 MED ORDER — LATANOPROST 0.005 % OP SOLN
1.0000 [drp] | Freq: Every day | OPHTHALMIC | Status: DC
  Administered 2020-10-01 – 2020-10-02 (×2): 1 [drp] via OPHTHALMIC
  Filled 2020-10-01 (×2): qty 2.5

## 2020-10-01 MED ORDER — STROKE: EARLY STAGES OF RECOVERY BOOK
Freq: Once | Status: AC
  Filled 2020-10-01: qty 1

## 2020-10-01 MED ORDER — ASPIRIN EC 81 MG PO TBEC
81.0000 mg | DELAYED_RELEASE_TABLET | Freq: Every day | ORAL | Status: DC
  Administered 2020-10-02 – 2020-10-03 (×2): 81 mg via ORAL
  Filled 2020-10-01 (×2): qty 1

## 2020-10-01 MED ORDER — ATORVASTATIN CALCIUM 10 MG PO TABS
20.0000 mg | ORAL_TABLET | Freq: Every day | ORAL | Status: DC
  Administered 2020-10-02 – 2020-10-03 (×2): 20 mg via ORAL
  Filled 2020-10-01 (×2): qty 2

## 2020-10-01 NOTE — ED Triage Notes (Signed)
Went to church at [employer] and at [employer] family and friends noticed slurred speech and facial droop, a/0 X4, lives alone

## 2020-10-01 NOTE — ED Notes (Signed)
Received call back from 3W, report given

## 2020-10-01 NOTE — ED Notes (Signed)
Returned from CT.

## 2020-10-01 NOTE — ED Notes (Signed)
NP Kirby-Graham at bedside, pt's left sided facial droop with improvement, only noticeable with smile

## 2020-10-01 NOTE — H&P (Addendum)
History and Physical    Leslie [name] UMP:[id number] DOB: 10-Oct-1926 DOA: 10/01/2020  Referring MD/NP/PA: Nelida Gores, MD PCP: Lucky Cowboy, MD  Patient coming from: Via EMS  Chief Complaint: Slurred speech and facial droop  I have personally briefly reviewed patient's old medical records in [facility]   HPI: Leslie [name] is a 87 y.o. female with medical history significant of hypertension, hyperlipidemia, prediabetes, and vitamin D deficiency who presented from church after being noticed to have slurred speech and left-sided facial droop.  Son is present at bedside and helps provide additional history.  At baseline patient still lives alone and is able to complete most of her ADLs.  She routinely follows with care provider, but does not require medications for blood pressure or cholesterol.  After patient started experiencing symptoms she still is able to ambulate without need of assistance and not any other symptoms or complaints at this time.  ED Course: Patient presented to the hospital as a code stroke.  CT brain was negative for any acute findings.  CT a of the head and neck did not note any large vessel occlusion.  NIHSS 2.  She was not a TPA candidate.  Labs significant for platelets 444, sodium 132, albumin 3.1, and total bilirubin 1.3.  Symptoms intermittently wax and wane she is in the emergency department.  TRH called to admit.  Review of Systems  Constitutional: Negative for chills and fever.  HENT: Negative for ear discharge and nosebleeds.   Eyes: Negative for photophobia and pain.  Respiratory: Negative for cough and shortness of breath.   Cardiovascular: Negative for chest pain and leg swelling.  Gastrointestinal: Negative for abdominal pain, nausea and vomiting.  Genitourinary: Negative for dysuria and hematuria.  Musculoskeletal: Negative for joint pain and myalgias.  Skin: Negative for rash.  Neurological: Positive for speech change and focal weakness.  Negative for loss of consciousness.  Psychiatric/Behavioral: Negative for memory loss and substance abuse.    History reviewed. No pertinent past medical history.  Past Surgical History:  Procedure Laterality Date   GLAUCOMA SURGERY  2013   TONSILLECTOMY       reports that she quit smoking about 41 years ago. She has never used smokeless tobacco. She reports that she does not drink alcohol and does not use drugs.  Allergies  Allergen Reactions   Adhesive [Tape]    Advil [Ibuprofen]    Atenolol     Family History  Problem Relation Age of Onset   Heart attack Brother    Heart disease Brother    Heart attack Brother    Heart disease Brother    Hypertension Mother    Stroke Mother    Heart attack Father    Diabetes Father     Prior to Admission medications   Medication Sig Start Date End Date Taking? Authorizing Provider  Cholecalciferol (VITAMIN D3) 125 MCG (5000 UT) CAPS Take 5,000 Units by mouth daily.   Yes [provider]  hydrocortisone (ANUSOL-HC) 2.5 % rectal cream Place 1 application rectally 2 (two) times daily. 08/23/20  Yes Wieters, Hallie C, PA-C  latanoprost (XALATAN) 0.005 % ophthalmic solution Place 1 drop into both eyes daily. 08/05/16  Yes [provider]  SENNA PO Take 1 tablet by mouth daily.   Yes [provider]  hydrocortisone (ANUSOL-HC) 25 MG suppository Place 1 suppository (25 mg total) rectally 2 (two) times daily. Patient not taking: No sig reported 08/23/20   Wieters, Junius Creamer, PA-C  Physical Exam:  Constitutional: Elderly female who appears to be in no acute distress Vitals:   10/01/20 1415 10/01/20 1430 10/01/20 1500 10/01/20 1530  BP: (!) 130/111 (!) 141/88 (!) 142/89 (!) 135/99  Pulse: 89 87 85 85  Resp: Temp:      TempSrc:      SpO2: 97% 98% 100% 97%  Weight:      Height:       Eyes: PERRL, lids and conjunctivae normal ENMT: Mucous membranes are moist. Posterior pharynx clear of  any exudate or lesions.left-sided facial droop present Neck: normal, supple, no masses, no thyromegaly Respiratory: clear to auscultation bilaterally, no wheezing, no crackles. Normal respiratory effort. No accessory muscle use.  Cardiovascular: Regular rate and rhythm, no murmurs / rubs / gallops. No extremity edema. 2+ pedal pulses. No carotid bruits.  Abdomen: no tenderness, no masses palpated. No hepatosplenomegaly. Bowel sounds positive.  Musculoskeletal: no clubbing / cyanosis. No joint deformity upper and lower extremities. Good ROM, no contractures. Normal muscle tone.  Skin: no rashes, lesions, ulcers. No induration Neurologic: CN 2-12 grossly intact.  Mild left-sided facial droop present at this time with some mild dysarthria. Psychiatric: Normal judgment and insight. Alert and oriented x 3. Normal mood.     Labs on Admission: I have personally reviewed following labs and imaging studies  CBC: Recent Labs  Lab 10/01/20 1313 10/01/20 1320  WBC 8.9  --   NEUTROABS 7.0  --   HGB 12.3 12.9  HCT 37.2 38.0  MCV 88.2  --   PLT 444*  --    Basic Metabolic Panel: Recent Labs  Lab 10/01/20 1313 10/01/20 1320  NA 132* 138  K 3.8 3.9  CL 100 102  CO2 25  --   GLUCOSE 108* 107*  BUN 19 21  CREATININE 0.88 0.80  CALCIUM 9.5  --    GFR: Estimated Creatinine Clearance: 34.2 mL/min (by C-G formula based on SCr of 0.8 mg/dL). Liver Function Tests: Recent Labs  Lab 10/01/20 1313  AST 17  ALT 15  ALKPHOS 60  BILITOT 1.3*  PROT 6.6  ALBUMIN 3.1*   No results for input(s): LIPASE, AMYLASE in the last 168 hours. No results for input(s): AMMONIA in the last 168 hours. Coagulation Profile: Recent Labs  Lab 10/01/20 1313  INR 1.1   Cardiac Enzymes: No results for input(s): CKTOTAL, CKMB, CKMBINDEX, TROPONINI in the last 168 hours. BNP (last 3 results) No results for input(s): PROBNP in the last 8760 hours. HbA1C: No results for input(s): HGBA1C in the last 72  hours. CBG: Recent Labs  Lab 10/01/20 1424  GLUCAP 87   Lipid Profile: No results for input(s): CHOL, HDL, LDLCALC, TRIG, CHOLHDL, LDLDIRECT in the last 72 hours. Thyroid Function Tests: No results for input(s): TSH, T4TOTAL, FREET4, T3FREE, THYROIDAB in the last 72 hours. Anemia Panel: No results for input(s): VITAMINB12, FOLATE, FERRITIN, TIBC, IRON, RETICCTPCT in the last 72 hours. Urine analysis:    Component Value Date/Time   COLORURINE YELLOW 02/02/2020 0959   APPEARANCEUR CLEAR 02/02/2020 0959   LABSPEC 1.021 02/02/2020 0959   PHURINE 6.0 02/02/2020 0959   GLUCOSEU NEGATIVE 02/02/2020 0959   HGBUR NEGATIVE 02/02/2020 0959   BILIRUBINUR NEGATIVE 10/21/2016 1034   KETONESUR NEGATIVE 02/02/2020 0959   PROTEINUR NEGATIVE 02/02/2020 0959   NITRITE NEGATIVE 02/02/2020 0959   LEUKOCYTESUR TRACE (A) 02/02/2020 0959   Sepsis Labs: No results found for this or any previous visit (from the past 240 hour(s)).  Radiological Exams on Admission: CT HEAD CODE STROKE WO CONTRAST  Result Date: 10/01/2020 CLINICAL DATA:  Code stroke. Acute stroke presentation. Deficit not described. EXAM: CT HEAD WITHOUT CONTRAST TECHNIQUE: Contiguous axial images were obtained from the base of the skull through the vertex without intravenous contrast. COMPARISON:  None. FINDINGS: Brain: Generalized age related atrophy. Chronic appearing small vessel ischemic changes of the hemispheric white matter. Old lacunar infarction of the left thalamus. Old small vessel infarctions of the cerebellum. No finding of acute infarction. No mass, hemorrhage, hydrocephalus or extra-axial collection. Vascular: There is atherosclerotic calcification of the major vessels at the base of the brain. Skull: Negative Sinuses/Orbits: Clear/normal Other: None ASPECTS (Alberta Stroke Program Early CT Score) - Ganglionic level infarction (caudate, lentiform nuclei, internal capsule, insula, M1-M3 cortex): 7 - Supraganglionic infarction  (M4-M6 cortex): 3 Total score (0-10 with 10 being normal): 10 IMPRESSION: 1. No acute finding by CT. Atrophy and chronic small-vessel ischemic changes. 2. ASPECTS is 10. 3. These results were communicated to Dr. Selina Cooley At 1:22 pmon 3/20/2022by text page via the [facility] messaging system. Electronically Signed   By: Paulina Fusi M.D.   On: 10/01/2020 13:22   CT ANGIO HEAD CODE STROKE  Result Date: 10/01/2020 CLINICAL DATA:  Slurred speech and facial droop. EXAM: CT ANGIOGRAPHY HEAD AND NECK TECHNIQUE: Multidetector CT imaging of the head and neck was performed using the standard protocol during bolus administration of intravenous contrast. Multiplanar CT image reconstructions and MIPs were obtained to evaluate the vascular anatomy. Carotid stenosis measurements (when applicable) are obtained utilizing NASCET criteria, using the distal internal carotid diameter as the denominator. CONTRAST:  69mL OMNIPAQUE IOHEXOL 350 MG/ML SOLN COMPARISON:  Head CT same day FINDINGS: CTA NECK FINDINGS Aortic arch: Aortic atherosclerosis and ectasia. Branching pattern is normal without origin stenosis. Right carotid system: Common carotid artery widely patent to the carotid bifurcation. Calcified plaque at the bifurcation and ICA bulb but no stenosis. Cervical ICA widely patent beyond that. Left carotid system: Common carotid artery widely patent to the bifurcation. Minimal calcified plaque at the carotid bifurcation. No stenosis. Cervical ICA widely patent. Vertebral arteries: Vertebral artery origins are widely patent. Vertebral arteries appear normal through the cervical region to the foramen magnum, though detail is somewhat limited due to extensive venous collateral flow adjacent to the vertebral arteries. Skeleton: Ordinary cervical spondylosis and facet osteoarthritis. Other neck: No mass or lymphadenopathy. Previous left thyroidectomy. Multiple nodules within the right lobe of the thyroid, the largest measuring 1.5 cm in  diameter. Upper chest: Minimal emphysema in the upper lobes. Apparent occlusion of the innominate vein, possibly due to extrinsic compression between the arterial structures and right clavicular head. This is responsible for the pattern of venous drainage described above. Review of the MIP images confirms the above findings CTA HEAD FINDINGS Anterior circulation: Both internal carotid arteries are patent through the skull base and siphon regions. Ordinary mild siphon atherosclerotic calcification without stenosis. The anterior and middle cerebral vessels are patent. No large or medium vessel occlusion. No proximal stenosis. No aneurysm or vascular malformation. Fetal origin of the right PCA. Posterior circulation: Both vertebral arteries are patent through the foramen magnum to the basilar. No basilar stenosis. Posterior circulation branch vessels are normal. Venous sinuses: Patent and normal. Anatomic variants: None other significant. Review of the MIP images confirms the above findings IMPRESSION: 1. Aortic atherosclerosis and ectasia. 2. Mild atherosclerotic change at both carotid bifurcations but without stenosis. 3. No intracranial large or medium vessel occlusion or correctable  proximal stenosis. 4. Apparent occlusion of the innominate vein, possibly due to extrinsic compression between the arterial structures and right clavicular head. This is responsible for the pattern of venous collateral drainage described in the body of the report. 5. Previous left thyroidectomy. Multiple nodules within the right lobe of the thyroid, the largest measuring 1.4 cm in diameter. Recommend thyroid US (ref: J Am Coll Radiol. 2015 Feb;12(2): 143-50). Aortic Atherosclerosis (ICD10-I70.0) and Emphysema (ICD10-J43.9). Electronically Signed   By: Paulina Fusi M.D.   On: 10/01/2020 15:03   CT ANGIO NECK CODE STROKE  Result Date: 10/01/2020 CLINICAL DATA:  Slurred speech and facial droop. EXAM: CT ANGIOGRAPHY HEAD AND NECK  TECHNIQUE: Multidetector CT imaging of the head and neck was performed using the standard protocol during bolus administration of intravenous contrast. Multiplanar CT image reconstructions and MIPs were obtained to evaluate the vascular anatomy. Carotid stenosis measurements (when applicable) are obtained utilizing NASCET criteria, using the distal internal carotid diameter as the denominator. CONTRAST:  5mL OMNIPAQUE IOHEXOL 350 MG/ML SOLN COMPARISON:  Head CT same day FINDINGS: CTA NECK FINDINGS Aortic arch: Aortic atherosclerosis and ectasia. Branching pattern is normal without origin stenosis. Right carotid system: Common carotid artery widely patent to the carotid bifurcation. Calcified plaque at the bifurcation and ICA bulb but no stenosis. Cervical ICA widely patent beyond that. Left carotid system: Common carotid artery widely patent to the bifurcation. Minimal calcified plaque at the carotid bifurcation. No stenosis. Cervical ICA widely patent. Vertebral arteries: Vertebral artery origins are widely patent. Vertebral arteries appear normal through the cervical region to the foramen magnum, though detail is somewhat limited due to extensive venous collateral flow adjacent to the vertebral arteries. Skeleton: Ordinary cervical spondylosis and facet osteoarthritis. Other neck: No mass or lymphadenopathy. Previous left thyroidectomy. Multiple nodules within the right lobe of the thyroid, the largest measuring 1.5 cm in diameter. Upper chest: Minimal emphysema in the upper lobes. Apparent occlusion of the innominate vein, possibly due to extrinsic compression between the arterial structures and right clavicular head. This is responsible for the pattern of venous drainage described above. Review of the MIP images confirms the above findings CTA HEAD FINDINGS Anterior circulation: Both internal carotid arteries are patent through the skull base and siphon regions. Ordinary mild siphon atherosclerotic  calcification without stenosis. The anterior and middle cerebral vessels are patent. No large or medium vessel occlusion. No proximal stenosis. No aneurysm or vascular malformation. Fetal origin of the right PCA. Posterior circulation: Both vertebral arteries are patent through the foramen magnum to the basilar. No basilar stenosis. Posterior circulation branch vessels are normal. Venous sinuses: Patent and normal. Anatomic variants: None other significant. Review of the MIP images confirms the above findings IMPRESSION: 1. Aortic atherosclerosis and ectasia. 2. Mild atherosclerotic change at both carotid bifurcations but without stenosis. 3. No intracranial large or medium vessel occlusion or correctable proximal stenosis. 4. Apparent occlusion of the innominate vein, possibly due to extrinsic compression between the arterial structures and right clavicular head. This is responsible for the pattern of venous collateral drainage described in the body of the report. 5. Previous left thyroidectomy. Multiple nodules within the right lobe of the thyroid, the largest measuring 1.4 cm in diameter. Recommend thyroid US (ref: J Am Coll Radiol. 2015 Feb;12(2): 143-50). Aortic Atherosclerosis (ICD10-I70.0) and Emphysema (ICD10-J43.9). Electronically Signed   By: Paulina Fusi M.D.   On: 10/01/2020 15:03    EKG: Independently reviewed.  Sinus rhythm at 90 bpm with first-degree heart block  Assessment/Plan CVA:  Patient presents with complaints of slurred speech and left-sided facial droop.  Initial work-up including CT and CTA of the head and neck were negative for any acute abnormality or signs of large vessel occlusion.  Hemoglobin A1c was last noted to be 5.7 on 08/29/2020 and most recent lipid panel revealed LDL of 100.  Risk factors also include hypertension, but patient currently not on medication for treatment -Admit to telemetry bed -Stroke order set initiated -Neuro checks -Check  MRI brain (Several  small/punctate acute infarctions at the right frontoparietal vertex. 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  These types of strokes give concern for embolic event from the either the aorta or heart.) -Check echocardiogram -PT/OT/Speech to eval and treat -Allow for permissive hypertension  -Aspirin -Appreciate neurology consultative services, will follow-up for further recommendation  Hypertension: Blood pressures currently 130/111-150/94.  Patient currently not on medication for treatment. -Allow for permissive hypertension keep systolic blood pressures less than 220/110  Hyponatremia: Acute.  Sodium 132 on admission.  Patient was eating and drinking appropriately. -Encourage fluids  Hyperlipidemia: Last LDL was 100 on 05/17/2020. -Question need to start statin given  Chronic kidney disease stage IIIa: Stable. -Continue to monitor  Prediabetes: Last hemoglobin A1c 5.7 on 08/29/2020.  Thyroid nodules: TSH was last noted to be 2.42 on 2/15. -Check TSH  Thrombocytosis: Acute.  Platelet count was 444.  Suspect reactive in nature secondary to stroke. -Continue to monitor  DNR: Present on admission   DVT prophylaxis: lovenox   Code Status: DNR Family Communication: Son updated at bedside Disposition Plan: To be determined Consults called: Neurology Admission status: Inpatient, require more than 2 midnight stay due to acute CVA  Clydie Braunondell A Tanajah Boulter MD [facility]   If 7PM-7AM, please contact night-coverage   10/01/2020, 4:18 PM

## 2020-10-01 NOTE — ED Notes (Signed)
Pt's CBG result was 87. Informed Cherylann Ratel - RN.

## 2020-10-01 NOTE — ED Notes (Signed)
Readings and picture recognition appropriate.  Pt's son states a couple of the words sound like she is a little slurred.  No obvious slurring recognized

## 2020-10-01 NOTE — Evaluation (Signed)
Speech Language Pathology Evaluation Patient Details Name: Leslie [name] MRN: [id number] DOB: 08-06-1926 Today's Date: 10/01/2020 Time: 6834-1962 SLP Time Calculation (min) (ACUTE ONLY): 20 min  Problem List:  Patient Active Problem List   Diagnosis Date Noted  . CVA (cerebral vascular accident) (HCC) 10/01/2020  . DNR no code (do not resuscitate) 10/01/2020  . Prediabetes 10/01/2020  . Thrombocytosis 10/01/2020  . BMI 22.0-22.9, adult 05/15/2020  . CKD (chronic kidney disease) stage 3, GFR 30-59 ml/min (HCC) 05/15/2020  . Labile hypertension 06/16/2018  . FHx: heart disease 06/16/2018  . Former smoker 06/16/2018  . Abnormal glucose 06/16/2018  . Anxiety 03/04/2018  . Medication management 04/08/2014  . Vitamin D deficiency 07/01/2013  . Hyperlipidemia, mixed 07/01/2013  . LBBB (left bundle branch block) 02/20/2010   Past Medical History: History reviewed. No pertinent past medical history. Past Surgical History:  Past Surgical History:  Procedure Laterality Date  . GLAUCOMA SURGERY  2013  . TONSILLECTOMY     HPI:  Patient is a 87 y.o. female with PMH: HTN, HLD, prediabetes and vitamin d deficiency who presented to ED from church after being noticed with slurred speech and left sided facial droop. At baseline, she lives alone and still drives, only takes vitamin d supplement but no other medications. 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   Assessment / Plan / Recommendation Clinical Impression  Patient presents with a mild cognitive impairment impacting her memory, awareness and question higher level problem solving abilities. Patient was oriented x4 but was not able to recall circumstances around her admission and she deferred to son when asked why she was in the hospital. Her awareness is also impacted by the fact that her symptoms are very mild.  Language abilities appeared WNL. As she lives by herself, she would benefit from further SLP intervention to assess higher level cognitive skills.    SLP Assessment  SLP Recommendation/Assessment: Patient needs continued Speech Lanaguage Pathology Services SLP Visit Diagnosis: Cognitive communication deficit (R41.841)    Follow Up Recommendations  None    Frequency and Duration min 1 x/week  1 week      SLP Evaluation Cognition  Overall Cognitive Status: Impaired/Different from baseline Arousal/Alertness: Awake/alert Orientation Level: Oriented to person;Oriented to place;Oriented to time Attention: Sustained Sustained Attention: Appears intact Memory: Impaired Memory Impairment: Decreased recall of new information Awareness: Impaired Awareness Impairment: Intellectual impairment Problem Solving: Appears intact (intact at basic level) Safety/Judgment: Appears intact       Comprehension  Auditory Comprehension Overall Auditory Comprehension: Appears within functional limits for tasks assessed    Expression Expression Primary Mode of Expression: Verbal Verbal Expression Overall Verbal Expression: Appears within functional limits for tasks assessed Initiation: No impairment   Oral / Motor  Oral Motor/Sensory Function Overall Oral Motor/Sensory Function: Mild impairment Facial ROM: Reduced left Facial Symmetry: Abnormal symmetry left Facial Strength: Reduced left Lingual ROM: Within Functional Limits Lingual Symmetry: Within Functional Limits Lingual Strength: Within Functional Limits Velum: Within Functional Limits Mandible: Within Functional Limits Motor Speech Overall Motor Speech: Impaired Respiration: Within functional limits Phonation: Normal Resonance: Within functional limits Articulation: Impaired Level of Impairment: Orlene Erm (son reports her speech slurring has been coming and going but SLP did not notice any significant dysarthria during this  assessment) Intelligibility: Intelligible Motor Planning: Witnin functional limits Motor Speech Errors: Not applicable   GO  Angela Nevin, MA, CCC-SLP Speech Therapy [facility]

## 2020-10-01 NOTE — ED Notes (Signed)
Returned from MRI 

## 2020-10-01 NOTE — ED Notes (Signed)
DNR arm band placed on right wrist

## 2020-10-01 NOTE — Consult Note (Addendum)
Neurology consult H&P  CC: code stroke  History is obtained from: EMS, son, and patient  HPI: Leslie [name] is a 87 y.o. female with a PMHx of HTN, HLD, pre DMII, glaucoma, and Vit D deficiency. Per son, she only takes a Vitamin D supplement daily. Patient was attending church this am and after the service, her friends noticed slurred speech with a left facial droop that began at 0930. She was able to ambulate and had no other stroke symptoms. 911 was called and patient was brought to [facility] ED as a code stroke. CBG and BP infield, 250 and 130/70, respectively.    Upon arrival, she is alert, talkative with no dysarthria and minor facial droop. After brief exam in ED bridge, patient was taken emergently to CT suite. CTH was negative for acute finding. NIHSS was 1. Patient not a candidate for tPA.   Patient was returned to ED room. Son at bedside and was updated on findings and plan. Exam completed by NP.   Later, RN paged because patient's speech was noticeably slurred and her facial droop was more noticable. No other neurological symptoms. NP and Neuro MD in room. Her speech was slightly slurred and her left facial droop was more astute. NIHSS 2. Stat CTA head and neck was accomplished with no LVO.   Patient was given ASA in ED and we will continue that at 81mg  po qd, enteric coated because patient states ASA makes her stomach hurt, but no history of GIB because of ASA.   In review of chart, she last saw her PCP 08/29/2020 for regular check up. Per note, her last LDL was 100, A1C 5.7% in 2021, on no BP meds. Also, on eye drop and low dose of Lorazepam for sleep (.25mg ). Labs were done at that visit. TSH 2.42 and Hgb A1c was 5.7.    Patient lives alone and is completely independent. She was driving up until a few months ago. Now her family takes her where she needs to go.   Patient will be admitted by medicine for complete stroke workup.   LKW: 9030 tpa given?: No, not a candidate and NIHSS 1.  IR  Thrombectomy? No, no LVO MRS 0  NIHSS:  1a Level of Conscious: 0 1b LOC Questions: 0 1c LOC Commands: 00 2 Best Gaze:  3 Visual: 0 4 Facial Palsy: 1 5a Motor Arm - left: 0 5b Motor Arm - Right: 0 6a Motor Leg - Left: 0 6b Motor Leg - Right: 0 7 Limb Ataxia: 0 8 Sensory: 0 9 Best Language: 0 10 Dysarthria: 0 11 Extinct. and Inatten: 0 TOTAL: 1  ROS: A complete ROS was performed and is negative except as noted in the HPI.  No HA, n/v, dizziness, vision changes.   PMHx: labile HTN not on medications, Vit D deficiency, HLD controlled with diet, hemorrhoids, and glaucoma.    Family History  Problem Relation Age of Onset  . Heart attack Brother   . Heart disease Brother   . Heart attack Brother   . Heart disease Brother   . Hypertension Mother   . Stroke Mother   . Heart attack Father   . Diabetes Father     Social History:  reports that she quit smoking about 41 years ago. She has never used smokeless tobacco. She reports that she does not drink alcohol and does not use drugs.   Prior to Admission medications   Medication Sig Start Date End Date Taking? Authorizing Provider  Cholecalciferol (VITAMIN D3) 125 MCG (5000 UT) CAPS Take 5,000 Units by mouth daily.   Yes [provider]  hydrocortisone (ANUSOL-HC) 2.5 % rectal cream Place 1 application rectally 2 (two) times daily. 08/23/20  Yes Wieters, Hallie C, PA-C  latanoprost (XALATAN) 0.005 % ophthalmic solution Place 1 drop into both eyes daily. 08/05/16  Yes [provider]  SENNA PO Take 1 tablet by mouth daily.   Yes [provider]  hydrocortisone (ANUSOL-HC) 25 MG suppository Place 1 suppository (25 mg total) rectally 2 (two) times daily. Patient not taking: Reported on 10/01/2020 08/23/20   Lew Dawes, PA-C    Exam: Current vital signs: BP (!) 135/99   Pulse 85   Temp 97.8 F (36.6 C) (Oral)   Resp 19   Ht 5\' 2"  (1.575 m)   Wt 48.3 kg   SpO2 97%   BMI 19.48 kg/m   Physical  Exam  Constitutional: Appears well-developed and well-nourished.  Psych: Affect light. Eyes: No scleral injection HENT: No OP obstrucion Head: Normocephalic.  Cardiovascular: Normal rate and regular rhythm.  Respiratory: Effort normal  GI: Soft.  No distension. There is no tenderness.  Skin: WDI  Neuro: Mental Status: Patient is awake, alert, oriented to person, place, month, year, and situation. Patient is able to give a clear and coherent history. She does not think her speech was slurred today and says she is speaking normally at time of arrival.  No signs of aphasia or neglect Speech/Language: Cranial Nerves: II: Visual Fields are full. Pupils are equal, round, and reactive to light.  III,IV, VI: EOMI without ptosis or diploplia.  V: Facial sensation is symmetric to light touch VII: left facial droop VIII: hearing is intact to voice X: Uvula elevates symmetrically XI: Shoulder shrug is symmetric. XII: tongue is midline without atrophy or fasciculations.  Motor: Tone is normal. Bulk is normal. 5/5 strength was present in all four extremities.  Sensory: Sensation is symmetric to light touch in the arms and legs.  Deep Tendon Reflexes: 1+ BUEs, 0 patella  Plantars: Toes are downgoing bilaterally.  Cerebellar: FNF and HKS are intact bilaterally.   I have reviewed labs in epic and the pertinent results are: INR 1.1  APTT 25  Glucose 107      MD reviewed the images obtained:  NCT head  1. No acute finding by CT. Atrophy and chronic small-vessel ischemic changes. 2. ASPECTS is 10. CTA head and neck  1. Aortic atherosclerosis and ectasia. 2. Mild atherosclerotic change at both carotid bifurcations but without stenosis. 3. No intracranial large or medium vessel occlusion or correctable proximal stenosis. 4. Apparent occlusion of the innominate vein, possibly due to extrinsic compression between the arterial structures and right clavicular head. This is responsible for  the pattern of venous collateral drainage described in the body of the report. 5. Previous left thyroidectomy. Multiple nodules within the right lobe of the thyroid, the largest measuring 1.4 cm in diameter. Recommend thyroid (ref: J Am Coll Radiol. 2015 Feb;12(2): 143-50).  Aortic Atherosclerosis (ICD10-I70.0) and Emphysema (ICD10-J43.9).  Assessment: 87 yo female who presented as a code stroke after friends thought she had a facial droop and slurred speech after church ended. Her NIHSS was 1, later 2 as per HPI. CTH was negative for acute finding. Patient was not a candidate for tPA. As per HPI, patient had return of slurred speech and more prominent facial droop which warranted a CTA head and neck which showed no LVO. Patient's risk  factors for stroke are pre DM (although HbA1c is normal), and HTN, and HLD. Patient will be admitted by medicine for complete stroke workup.   Impression: TIA vs small ischemic stroke  Plan: -medicine admit - MRI brain without contrast. - Recommend TTE. - Recommend labs: had recent labs at PCP as above. Even though LDL was 100, do not feel statin in this 87 yo woman is necessary.  - Aspirin 81mg  daily. - SBP goal - Permissive hypertension first 24 h < 220/110. Hold home medications for now. - Telemetry monitoring for arrhythmia. - Recommend bedside Swallow screen. - Recommend Stroke education. - Recommend PT/OT/SLP consult. -stroke education. Risk factor modification.  - Recommend metabolic/infectious workup with CBC, CMP, UA with UCx, CXR, CK, serum lactate. -defer to medicine workup for thyroid nodules on CTA.    Electronically signed by: , MSN, APN-BC, nurse practitioner and by MD.  Pager: [phone]   I examined the patient, reviewed relevant imaging, and made medical decisions during the stroke code. Above note was written by NP [id number] and edited by me to reflect my findings and recommendations.  LKN 0930 w/ friends after church  developed L facial droop and dysarthria. Dysarthria resolved en route. NIHSS = 1 @ 1258 on arrival for L facial droop only. CTH NAICP. tPA not administered due to mild non-disabling sx (facial droop only). CTA not initially performed 2/2 exam not c/w LVO. She did experience a transient worsening of mild dysarthria and facial droop at which point we did order a CTA H&N which was negative. Admitted for TIA/stroke w/u.     This patient is critically ill and at significant risk of neurological worsening, death and care requires constant monitoring of vital signs, hemodynamics,respiratory and cardiac monitoring, neurological assessment, discussion with family, other specialists and medical decision making of high complexity. I spent 75 minutes of neurocritical care time  in the care of  this patient. This was time spent independent of any time provided by nurse practitioner or PA.  Craige Cotta, MD [facility] [phone]  If 7pm- 7am, please page neurology on call as listed in AMION. '

## 2020-10-01 NOTE — ED Provider Notes (Addendum)
[facility] EMERGENCY DEPARTMENT Provider Note   CSN: [id number] Arrival date & time: 10/01/20  1304     History Chief Complaint  Patient presents with  . Code Stroke    Last time known well at 0930, had slurred speech that was noticed by friends, now resolved, Nihss of 1, slight left side facial droop    Leslie [name] is a 87 y.o. female.  HPI At baseline patient is active and living independently.  She went to church this morning at 9:30 AM.  She was with family and friends and at 11:30 AM they noticed that her speech was slurred and that the left side of her face had a droop to it.  No other deficits.  No focal extremity weakness or gait dysfunction.  No headache.  No visual changes.  Patient reports she has been well recently.    History reviewed. No pertinent past medical history.  Patient Active Problem List   Diagnosis Date Noted  . BMI 22.0-22.9, adult 05/15/2020  . CKD (chronic kidney disease) stage 3, GFR 30-59 ml/min (HCC) 05/15/2020  . Labile hypertension 06/16/2018  . FHx: heart disease 06/16/2018  . Former smoker 06/16/2018  . Abnormal glucose 06/16/2018  . Anxiety 03/04/2018  . Medication management 04/08/2014  . Vitamin D deficiency 07/01/2013  . Hyperlipidemia, mixed 07/01/2013  . LBBB (left bundle branch block) 02/20/2010    Past Surgical History:  Procedure Laterality Date  . GLAUCOMA SURGERY  2013  . TONSILLECTOMY       OB History   No obstetric history on file.     Family History  Problem Relation Age of Onset  . Heart attack Brother   . Heart disease Brother   . Heart attack Brother   . Heart disease Brother   . Hypertension Mother   . Stroke Mother   . Heart attack Father   . Diabetes Father     Social History   Tobacco Use  . Smoking status: Former Smoker    Quit date: 07/16/1979    Years since quitting: 41.2  . Smokeless tobacco: Never Used  Vaping Use  . Vaping Use: Never used  Substance Use Topics  .  Alcohol use: No    Alcohol/week: 0.0 standard drinks  . Drug use: No    Home Medications Prior to Admission medications   Medication Sig Start Date End Date Taking? Authorizing Provider  Cholecalciferol (VITAMIN D3) 125 MCG (5000 UT) CAPS Take 5,000 Units by mouth daily.   Yes [provider]  hydrocortisone (ANUSOL-HC) 2.5 % rectal cream Place 1 application rectally 2 (two) times daily. 08/23/20  Yes Wieters, Hallie C, PA-C  latanoprost (XALATAN) 0.005 % ophthalmic solution Place 1 drop into both eyes daily. 08/05/16  Yes [provider]  SENNA PO Take 1 tablet by mouth daily.   Yes [provider]  hydrocortisone (ANUSOL-HC) 25 MG suppository Place 1 suppository (25 mg total) rectally 2 (two) times daily. Patient not taking: No sig reported 08/23/20   Wieters, Hallie C, PA-C    Allergies    Adhesive [tape], Advil [ibuprofen], and Atenolol  Review of Systems   Review of Systems 10 systems reviewed negative except as per HPI Physical Exam Updated Vital Signs BP (!) 135/99   Pulse 85   Temp 97.8 F (36.6 C) (Oral)   Resp 19   Ht 5\' 2"  (1.575 m)   Wt 48.3 kg   SpO2 97%   BMI 19.48 kg/m   Physical  Exam Constitutional:      Comments: Alert.  Clear mental status.  GCS 15.  No respiratory distress  HENT:     Head: Normocephalic and atraumatic.     Nose: Nose normal.     Mouth/Throat:     Mouth: Mucous membranes are moist.     Pharynx: Oropharynx is clear.  Eyes:     Extraocular Movements: Extraocular movements intact.     Pupils: Pupils are equal, round, and reactive to light.  Cardiovascular:     Rate and Rhythm: Normal rate and regular rhythm.  Pulmonary:     Effort: Pulmonary effort is normal.     Breath sounds: Normal breath sounds.  Abdominal:     General: There is no distension.     Palpations: Abdomen is soft.     Tenderness: There is no abdominal tenderness. There is no guarding.  Musculoskeletal:        General: No swelling or  tenderness. Normal range of motion.     Right lower leg: No edema.     Left lower leg: No edema.     Comments: Extremities are in very good condition.  No peripheral edema.  Musculature symmetric and full.  Skin:    General: Skin is warm and dry.  Neurological:     Comments: Patient is alert.  GCS is 15.  She can state her full name and address.  Subtle left mouth droop.  Slight subtle splurred speech but no aphasia.  Grip strength 5\5 bilateral upper extremities.  Lower extremities patient can elevate and hold against resistance without weakness.  Psychiatric:        Mood and Affect: Mood normal.     ED Results / Procedures / Treatments   Labs (all labs ordered are listed, but only abnormal results are displayed) Labs Reviewed  CBC - Abnormal; Notable for the following components:      Result Value   Platelets 444 (*)    All other components within normal limits  COMPREHENSIVE METABOLIC PANEL - Abnormal; Notable for the following components:   Sodium 132 (*)    Glucose, Bld 108 (*)    Albumin 3.1 (*)    Total Bilirubin 1.3 (*)    All other components within normal limits  I-STAT CHEM 8, ED - Abnormal; Notable for the following components:   Glucose, Bld 107 (*)    All other components within normal limits  PROTIME-INR  APTT  DIFFERENTIAL  CBG MONITORING, ED    EKG EKG Interpretation  Date/Time:  Sunday October 01 2020 13:28:27 EDT Ventricular Rate:  90 PR Interval:    QRS Duration: 146 QT Interval:  407 QTC Calculation: 498 R Axis:   -85 Text Interpretation: Sinus rhythm Left bundle branch block agree, no old comparison Confirmed by Arby BarrettePfeiffer, Ceaira Ernster [phone]([id number]) on 10/01/2020 4:15:05 PM   Radiology CT HEAD CODE STROKE WO CONTRAST  Result Date: 10/01/2020 CLINICAL DATA:  Code stroke. Acute stroke presentation. Deficit not described. EXAM: CT HEAD WITHOUT CONTRAST TECHNIQUE: Contiguous axial images were obtained from the base of the skull through the vertex without  intravenous contrast. COMPARISON:  None. FINDINGS: Brain: Generalized age related atrophy. Chronic appearing small vessel ischemic changes of the hemispheric white matter. Old lacunar infarction of the left thalamus. Old small vessel infarctions of the cerebellum. No finding of acute infarction. No mass, hemorrhage, hydrocephalus or extra-axial collection. Vascular: There is atherosclerotic calcification of the major vessels at the base of the brain. Skull: Negative Sinuses/Orbits: Clear/normal Other: None  ASPECTS [facility] Stroke Program Early CT Score) - Ganglionic level infarction (caudate, lentiform nuclei, internal capsule, insula, M1-M3 cortex): 7 - Supraganglionic infarction (M4-M6 cortex): 3 Total score (0-10 with 10 being normal): 10 IMPRESSION: 1. No acute finding by CT. Atrophy and chronic small-vessel ischemic changes. 2. ASPECTS is 10. 3. These results were communicated to Dr. Selina Cooley At 1:22 pmon 3/20/2022by text page via the [facility] messaging system. Electronically Signed   By: Paulina Fusi M.D.   On: 10/01/2020 13:22   CT ANGIO HEAD CODE STROKE  Result Date: 10/01/2020 CLINICAL DATA:  Slurred speech and facial droop. EXAM: CT ANGIOGRAPHY HEAD AND NECK TECHNIQUE: Multidetector CT imaging of the head and neck was performed using the standard protocol during bolus administration of intravenous contrast. Multiplanar CT image reconstructions and MIPs were obtained to evaluate the vascular anatomy. Carotid stenosis measurements (when applicable) are obtained utilizing NASCET criteria, using the distal internal carotid diameter as the denominator. CONTRAST:  35mL OMNIPAQUE IOHEXOL 350 MG/ML SOLN COMPARISON:  Head CT same day FINDINGS: CTA NECK FINDINGS Aortic arch: Aortic atherosclerosis and ectasia. Branching pattern is normal without origin stenosis. Right carotid system: Common carotid artery widely patent to the carotid bifurcation. Calcified plaque at the bifurcation and ICA bulb but no stenosis.  Cervical ICA widely patent beyond that. Left carotid system: Common carotid artery widely patent to the bifurcation. Minimal calcified plaque at the carotid bifurcation. No stenosis. Cervical ICA widely patent. Vertebral arteries: Vertebral artery origins are widely patent. Vertebral arteries appear normal through the cervical region to the foramen magnum, though detail is somewhat limited due to extensive venous collateral flow adjacent to the vertebral arteries. Skeleton: Ordinary cervical spondylosis and facet osteoarthritis. Other neck: No mass or lymphadenopathy. Previous left thyroidectomy. Multiple nodules within the right lobe of the thyroid, the largest measuring 1.5 cm in diameter. Upper chest: Minimal emphysema in the upper lobes. Apparent occlusion of the innominate vein, possibly due to extrinsic compression between the arterial structures and right clavicular head. This is responsible for the pattern of venous drainage described above. Review of the MIP images confirms the above findings CTA HEAD FINDINGS Anterior circulation: Both internal carotid arteries are patent through the skull base and siphon regions. Ordinary mild siphon atherosclerotic calcification without stenosis. The anterior and middle cerebral vessels are patent. No large or medium vessel occlusion. No proximal stenosis. No aneurysm or vascular malformation. Fetal origin of the right PCA. Posterior circulation: Both vertebral arteries are patent through the foramen magnum to the basilar. No basilar stenosis. Posterior circulation branch vessels are normal. Venous sinuses: Patent and normal. Anatomic variants: None other significant. Review of the MIP images confirms the above findings IMPRESSION: 1. Aortic atherosclerosis and ectasia. 2. Mild atherosclerotic change at both carotid bifurcations but without stenosis. 3. No intracranial large or medium vessel occlusion or correctable proximal stenosis. 4. Apparent occlusion of the  innominate vein, possibly due to extrinsic compression between the arterial structures and right clavicular head. This is responsible for the pattern of venous collateral drainage described in the body of the report. 5. Previous left thyroidectomy. Multiple nodules within the right lobe of the thyroid, the largest measuring 1.4 cm in diameter. Recommend thyroid US (ref: J Am Coll Radiol. 2015 Feb;12(2): 143-50). Aortic Atherosclerosis (ICD10-I70.0) and Emphysema (ICD10-J43.9). Electronically Signed   By: Paulina Fusi M.D.   On: 10/01/2020 15:03   CT ANGIO NECK CODE STROKE  Result Date: 10/01/2020 CLINICAL DATA:  Slurred speech and facial droop. EXAM: CT ANGIOGRAPHY HEAD AND NECK  TECHNIQUE: Multidetector CT imaging of the head and neck was performed using the standard protocol during bolus administration of intravenous contrast. Multiplanar CT image reconstructions and MIPs were obtained to evaluate the vascular anatomy. Carotid stenosis measurements (when applicable) are obtained utilizing NASCET criteria, using the distal internal carotid diameter as the denominator. CONTRAST:  75mL OMNIPAQUE IOHEXOL 350 MG/ML SOLN COMPARISON:  Head CT same day FINDINGS: CTA NECK FINDINGS Aortic arch: Aortic atherosclerosis and ectasia. Branching pattern is normal without origin stenosis. Right carotid system: Common carotid artery widely patent to the carotid bifurcation. Calcified plaque at the bifurcation and ICA bulb but no stenosis. Cervical ICA widely patent beyond that. Left carotid system: Common carotid artery widely patent to the bifurcation. Minimal calcified plaque at the carotid bifurcation. No stenosis. Cervical ICA widely patent. Vertebral arteries: Vertebral artery origins are widely patent. Vertebral arteries appear normal through the cervical region to the foramen magnum, though detail is somewhat limited due to extensive venous collateral flow adjacent to the vertebral arteries. Skeleton: Ordinary cervical  spondylosis and facet osteoarthritis. Other neck: No mass or lymphadenopathy. Previous left thyroidectomy. Multiple nodules within the right lobe of the thyroid, the largest measuring 1.5 cm in diameter. Upper chest: Minimal emphysema in the upper lobes. Apparent occlusion of the innominate vein, possibly due to extrinsic compression between the arterial structures and right clavicular head. This is responsible for the pattern of venous drainage described above. Review of the MIP images confirms the above findings CTA HEAD FINDINGS Anterior circulation: Both internal carotid arteries are patent through the skull base and siphon regions. Ordinary mild siphon atherosclerotic calcification without stenosis. The anterior and middle cerebral vessels are patent. No large or medium vessel occlusion. No proximal stenosis. No aneurysm or vascular malformation. Fetal origin of the right PCA. Posterior circulation: Both vertebral arteries are patent through the foramen magnum to the basilar. No basilar stenosis. Posterior circulation branch vessels are normal. Venous sinuses: Patent and normal. Anatomic variants: None other significant. Review of the MIP images confirms the above findings IMPRESSION: 1. Aortic atherosclerosis and ectasia. 2. Mild atherosclerotic change at both carotid bifurcations but without stenosis. 3. No intracranial large or medium vessel occlusion or correctable proximal stenosis. 4. Apparent occlusion of the innominate vein, possibly due to extrinsic compression between the arterial structures and right clavicular head. This is responsible for the pattern of venous collateral drainage described in the body of the report. 5. Previous left thyroidectomy. Multiple nodules within the right lobe of the thyroid, the largest measuring 1.4 cm in diameter. Recommend thyroid US (ref: J Am Coll Radiol. 2015 Feb;12(2): 143-50). Aortic Atherosclerosis (ICD10-I70.0) and Emphysema (ICD10-J43.9). Electronically Signed    By: Paulina Fusi M.D.   On: 10/01/2020 15:03    Procedures Procedures  No critical care Medications Ordered in ED Medications  LORazepam (ATIVAN) injection 0.5 mg (has no administration in time range)  sodium chloride flush (NS) 0.9 % injection 3 mL (3 mLs Intravenous Given 10/01/20 1328)  iohexol (OMNIPAQUE) 350 MG/ML injection 75 mL (75 mLs Intravenous Contrast Given 10/01/20 1449)  aspirin suppository 300 mg (300 mg Rectal Given 10/01/20 1419)    ED Course  I have reviewed the triage vital signs and the nursing notes.  Pertinent labs & imaging results that were available during my care of the patient were reviewed by me and considered in my medical decision making (see chart for details).    MDM Rules/Calculators/A&P  Consult: Patient was seen as code stroke by neurology team. Consult: Dr. Katrinka Blazing [facility] hospitalist for admission.  Patient is a healthy active 87 year old female with minimal medical problems.  She was at church today when she developed a sudden slight left facial droop and slurred speech noted by family members and friends.  Patient is evaluated as code stroke by neurology.  No acute findings on initial CT scan.  Patient seemed to have some stuttering of severity of slurred speech during her course in the emergency department.  Initially it was nearly resolved but then was noted to have recurred, patient was then taken by neurology to CT angios which did not show critical occlusions.  I have rechecked patient.  She remained stable.  She is alert with limited symptoms.  We will proceed with MRI and admission per neurology recommendations. Final Clinical Impression(s) / ED Diagnoses Final diagnoses:  Cerebrovascular accident (CVA), unspecified mechanism [facility])    Rx / DC Orders ED Discharge Orders    None       Arby Barrette, MD 10/01/20 1616    Arby Barrette, MD 10/01/20 [id number]

## 2020-10-01 NOTE — ED Notes (Signed)
Report to 3W.

## 2020-10-01 NOTE — ED Notes (Signed)
Call back to 3W to give report

## 2020-10-01 NOTE — ED Notes (Signed)
3W to call me back for report.

## 2020-10-01 NOTE — Evaluation (Signed)
Clinical/Bedside Swallow Evaluation Patient Details  Name: Leslie [name] MRN: [id number] Date of Birth: 02-27-1927  Today's Date: 10/01/2020 Time: SLP Start Time (ACUTE ONLY): 1755 SLP Stop Time (ACUTE ONLY): 1815 SLP Time Calculation (min) (ACUTE ONLY): 20 min  Past Medical History: History reviewed. No pertinent past medical history. Past Surgical History:  Past Surgical History:  Procedure Laterality Date  . GLAUCOMA SURGERY  2013  . TONSILLECTOMY     HPI:  Patient is a 87 y.o. female with PMH: HTN, HLD, prediabetes and vitamin d deficiency who presented to ED from church after being noticed with slurred speech and left sided facial droop. At baseline, she lives alone and still drives, only takes vitamin d supplement but no other medications. 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   Assessment / Plan / Recommendation Clinical Impression  Patient presents with a mild pharyngeal phase dysphagia secondary to overt dry coughing after initial sip of thin liquids (water) via straw sips. RN reported that patient was exhibiting some coughing during initial [facility] swallow screen. Patient took small bites of regular solids but did not exhibit any significant difficulty or delay with mastication or managing of regular texture solids. Plan to initiate regular solids, thin liquids diet and SLP to monitor for toleration. SLP Visit Diagnosis: Dysphagia, unspecified (R13.10)    Aspiration Risk  Mild aspiration risk    Diet Recommendation Thin liquid;Regular   Liquid Administration via: Cup;Straw Medication Administration: Whole meds with liquid Supervision: Patient able to self feed Compensations: Slow rate;Small sips/bites Postural Changes: Seated upright at 90 degrees    Other  Recommendations Oral Care Recommendations: Oral care BID   Follow up Recommendations None       Frequency and Duration min 1 x/week  1 week       Prognosis Prognosis for Safe Diet Advancement: Good      Swallow Study   General Date of Onset: 10/01/20 HPI: Patient is a 87 y.o. female with PMH: HTN, HLD, prediabetes and vitamin d deficiency who presented to ED from church after being noticed with slurred speech and left sided facial droop. At baseline, she lives alone and still drives, only takes vitamin d supplement but no other medications. 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 Type of Study: Bedside Swallow Evaluation Previous Swallow Assessment: none found Diet Prior to this Study: NPO Temperature Spikes Noted: No Respiratory Status: Room air History of Recent Intubation: No Behavior/Cognition: Alert;Cooperative;Pleasant mood Oral Cavity Assessment: Within Functional Limits Oral Care Completed by SLP: Recent completion by staff Oral Cavity - Dentition: Adequate natural dentition Vision: Functional for self-feeding Self-Feeding Abilities: Able to feed self Patient Positioning: Upright in bed Baseline Vocal Quality: Normal Volitional Cough: Strong Volitional Swallow: Able to elicit    Oral/Motor/Sensory Function Overall Oral Motor/Sensory Function: Mild impairment Facial ROM: Reduced left Facial Symmetry: Abnormal symmetry left Facial Strength: Reduced left Lingual ROM: Within Functional Limits Lingual Symmetry: Within Functional Limits Lingual Strength: Within Functional Limits Velum: Within Functional Limits Mandible: Within Functional Limits   Ice Chips     Thin Liquid Thin Liquid: Impaired Presentation: Straw Pharyngeal  Phase Impairments: Cough - Immediate Other Comments: mild dry cough with first straw sip of water    Nectar Thick     Honey Thick     Puree Puree: Not tested  Solid     Solid: Within functional limits       Angela Nevin, MA, CCC-SLP Speech Therapy [facility]

## 2020-10-01 NOTE — ED Notes (Signed)
Speech obviously slurred now, neuro paged, son at bedside

## 2020-10-01 NOTE — ED Notes (Signed)
Speech more clear, facial droop improving, no complaints, son remains at bedside

## 2020-10-02 ENCOUNTER — Inpatient Hospital Stay (HOSPITAL_COMMUNITY): Payer: Medicare Other

## 2020-10-02 ENCOUNTER — Encounter (HOSPITAL_COMMUNITY): Payer: Self-pay | Admitting: Internal Medicine

## 2020-10-02 DIAGNOSIS — I639 Cerebral infarction, unspecified: Secondary | ICD-10-CM | POA: Diagnosis not present

## 2020-10-02 DIAGNOSIS — I6389 Other cerebral infarction: Secondary | ICD-10-CM

## 2020-10-02 DIAGNOSIS — I6340 Cerebral infarction due to embolism of unspecified cerebral artery: Secondary | ICD-10-CM

## 2020-10-02 DIAGNOSIS — I5021 Acute systolic (congestive) heart failure: Secondary | ICD-10-CM

## 2020-10-02 LAB — ECHOCARDIOGRAM COMPLETE
AR max vel: 1.43 cm2
AV Area VTI: 1.43 cm2
AV Area mean vel: 1.28 cm2
AV Mean grad: 6 mmHg
AV Peak grad: 11.3 mmHg
Ao pk vel: 1.68 m/s
Height: 62 in
S' Lateral: 4.9 cm
Weight: 1619.06 oz

## 2020-10-02 LAB — HEMOGLOBIN A1C
Hgb A1c MFr Bld: 5.8 % — ABNORMAL HIGH (ref 4.8–5.6)
Mean Plasma Glucose: 119.76 mg/dL

## 2020-10-02 LAB — TSH: TSH: 2.076 u[IU]/mL (ref 0.350–4.500)

## 2020-10-02 LAB — POTASSIUM: Potassium: 3.5 mmol/L (ref 3.5–5.1)

## 2020-10-02 LAB — MAGNESIUM: Magnesium: 1.9 mg/dL (ref 1.7–2.4)

## 2020-10-02 MED ORDER — FOSFOMYCIN TROMETHAMINE 3 G PO PACK
3.0000 g | PACK | Freq: Once | ORAL | Status: AC
  Administered 2020-10-02: 3 g via ORAL
  Filled 2020-10-02: qty 3

## 2020-10-02 NOTE — Progress Notes (Signed)
Lower extremity venous has been completed.   Preliminary results in CV Proc.   Blanch Media 10/02/2020 10:45 AM

## 2020-10-02 NOTE — Consult Note (Signed)
Cardiology Consultation:   Patient ID: Leslie [name] MRN: [id number]; DOB: 1927-02-23  Admit date: 10/01/2020 Date of Consult: 10/02/2020  PCP:  Lucky Cowboy, MD   [facility]  Cardiologist:  Donato Schultz, MD  Advanced Practice Provider:  No care team member to display Electrophysiologist:  None        Patient Profile:   Leslie [name] is a 87 y.o. female with stroke who is being seen today for the evaluation of newly discovered cardiomyopathy at the request of Dr. Elvera Lennox.  History of Present Illness:   Leslie [name] is a 87 year old female admitted with stroke.  CT angiogram does not show any evidence of large or medium vessel occlusion.  She is independent at baseline with history of hypertension hyperlipidemia.  Her symptoms were slurred speech and left-sided facial droop after church.  MRI showed small acute infarctions of the right frontoparietal vertex in the left lateral cerebellum suggestive of embolic disease.  During the work-up, an echocardiogram was performed that showed severely depressed left ventricular ejection fraction, EF 25%.  Right ventricle appears normal.  About 15 years ago she was sent to the cardiologist because of "heart failure "but remembers that she did not have any evidence of this on prior work-up.  She does have left bundle branch block.  This is old.  Currently she is not having any symptoms.  When asked if she has any chest pain shortness of breath syncope swelling or other related heart failure type symptoms she says no.  She does tell me though she is "87 years old ".  Currently she is on aspirin, atorvastatin, clopidogrel.  Blood pressure running from 10 4-1 31 systolic.  Creatinine 0.8    History reviewed. No pertinent past medical history.  Past Surgical History:  Procedure Laterality Date  . GLAUCOMA SURGERY  2013  . TONSILLECTOMY       Home Medications:  Prior to Admission medications   Medication Sig Start Date  End Date Taking? Authorizing Provider  Cholecalciferol (VITAMIN D3) 125 MCG (5000 UT) CAPS Take 5,000 Units by mouth daily.   Yes [provider]  hydrocortisone (ANUSOL-HC) 2.5 % rectal cream Place 1 application rectally 2 (two) times daily. 08/23/20  Yes Wieters, Hallie C, PA-C  latanoprost (XALATAN) 0.005 % ophthalmic solution Place 1 drop into both eyes daily. 08/05/16  Yes [provider]  SENNA PO Take 1 tablet by mouth daily.   Yes [provider]  hydrocortisone (ANUSOL-HC) 25 MG suppository Place 1 suppository (25 mg total) rectally 2 (two) times daily. Patient not taking: No sig reported 08/23/20   Lew Dawes, PA-C    Inpatient Medications: Scheduled Meds: . aspirin EC  81 mg Oral Daily  . atorvastatin  20 mg Oral Daily  . clopidogrel  75 mg Oral Daily  . enoxaparin (LOVENOX) injection  40 mg Subcutaneous Q24H  . latanoprost  1 drop Both Eyes QHS   Continuous Infusions:  PRN Meds: acetaminophen **OR** acetaminophen (TYLENOL) oral liquid 160 mg/5 mL **OR** acetaminophen, hydrALAZINE, LORazepam, senna-docusate  Allergies:    Allergies  Allergen Reactions  . Adhesive [Tape]   . Advil [Ibuprofen]   . Atenolol     Social History:   Social History   Socioeconomic History  . Marital status: Married    Spouse name: Not on file  . Number of children: Not on file  . Years of education: Not on file  . Highest education level: Not on file  Occupational  History  . Not on file  Tobacco Use  . Smoking status: Former Smoker    Quit date: 07/16/1979    Years since quitting: 41.2  . Smokeless tobacco: Never Used  Vaping Use  . Vaping Use: Never used  Substance and Sexual Activity  . Alcohol use: No    Alcohol/week: 0.0 standard drinks  . Drug use: No  . Sexual activity: Not Currently  Other Topics Concern  . Not on file  Social History Narrative  . Not on file   Social Determinants of Health   Financial Resource Strain: Not on file  Food  Insecurity: Not on file  Transportation Needs: Not on file  Physical Activity: Not on file  Stress: Not on file  Social Connections: Not on file  Intimate Partner Violence: Not on file    Family History:    Family History  Problem Relation Age of Onset  . Heart attack Brother   . Heart disease Brother   . Heart attack Brother   . Heart disease Brother   . Hypertension Mother   . Stroke Mother   . Heart attack Father   . Diabetes Father      ROS:  Please see the history of present illness.   All other ROS reviewed and negative.     Physical Exam/Data:   Vitals:   10/02/20 0453 10/02/20 0700 10/02/20 1100 10/02/20 1500  BP: 107/71 123/75 131/84 123/78  Pulse: 86 77 89 85  Resp: 18 18  16   Temp: 98.4 F (36.9 C) 98.2 F (36.8 C) 98.2 F (36.8 C) 98 F (36.7 C)  TempSrc: Oral Oral Oral Oral  SpO2: 97% 95% 95% 96%  Weight:      Height:        Intake/Output Summary (Last 24 hours) at 10/02/2020 1707 Last data filed at 10/01/2020 1832 Gross per 24 hour  Intake 150 ml  Output --  Net 150 ml   Last 3 Weights 10/01/2020 10/01/2020 08/29/2020  Weight (lbs) 101 lb 3.1 oz 106 lb 7.7 oz 106 lb  Weight (kg) 45.9 kg 48.3 kg 48.081 kg     Body mass index is 18.51 kg/m.  General: Thin, in no acute distress, elderly HEENT: normal Lymph: no adenopathy Neck: no JVD Endocrine:  No thryomegaly Vascular: No carotid bruits; Cardiac:  normal S1, S2; RRR; no murmur  Lungs:  clear to auscultation bilaterally, no wheezing, rhonchi or rales  Abd: soft, nontender, no hepatomegaly  Ext: no edema Musculoskeletal:  No deformities, BUE and BLE strength normal and equal Skin: warm and dry  Neuro: Mild left-sided facial droop noted. Psych:  Normal affect   EKG:  The EKG was personally reviewed and demonstrates: This rhythm, 90, left bundle branch block  Telemetry:  Telemetry was personally reviewed and demonstrates: Sinus rhythm, left bundle branch block  Relevant CV  Studies: Echocardiogram this admit:  1. LVEF is severely depressed with akinesis of the septum, inferior and  apical walls; hypokinesis elsewhere. Left ventricular ejection fraction,  by estimation, is 25%. The left ventricular internal cavity size was  moderately dilated. Indeterminate  diastolic filling due to E-A fusion.  2. Right ventricular systolic function is normal. The right ventricular  size is normal.  3. Left atrial size was mildly dilated.  4. The mitral valve is abnormal. Mild mitral valve regurgitation.  5. The aortic valve is normal in structure. Aortic valve regurgitation is  trivial.  6. The inferior vena cava is normal in size with greater  than 50%  respiratory variability, suggesting right atrial pressure of 3 mmHg.   Laboratory Data:  High Sensitivity Troponin:  No results for input(s): TROPONINIHS in the last 720 hours.   Chemistry Recent Labs  Lab 10/01/20 1313 10/01/20 1320  NA 132* 138  K 3.8 3.9  CL 100 102  CO2 25  --   GLUCOSE 108* 107*  BUN 19 21  CREATININE 0.88 0.80  CALCIUM 9.5  --   GFRNONAA >60  --   ANIONGAP 7  --     Recent Labs  Lab 10/01/20 1313  PROT 6.6  ALBUMIN 3.1*  AST 17  ALT 15  ALKPHOS 60  BILITOT 1.3*   Hematology Recent Labs  Lab 10/01/20 1313 10/01/20 1320  WBC 8.9  --   RBC 4.22  --   HGB 12.3 12.9  HCT 37.2 38.0  MCV 88.2  --   MCH 29.1  --   MCHC 33.1  --   RDW 15.4  --   PLT 444*  --    BNPNo results for input(s): BNP, PROBNP in the last 168 hours.  DDimer No results for input(s): DDIMER in the last 168 hours.   Radiology/Studies:  MR BRAIN WO CONTRAST  Result Date: 10/01/2020 CLINICAL DATA:  Slurred speech and left face droop beginning today. EXAM: MRI HEAD WITHOUT CONTRAST TECHNIQUE: Multiplanar, multiecho pulse sequences of the brain and surrounding structures were obtained without intravenous contrast. COMPARISON:  CT studies same day. FINDINGS: Brain: Small acute infarction in the left  lateral cerebellum. Several punctate acute infarctions at the right frontoparietal vertex affecting the cortex and subcortical white matter. No confluent large vessel infarction. Generalized age related atrophy. Chronic small-vessel ischemic changes of the pons. Numerous old small vessel cerebellar infarctions. Old small vessel infarctions of the thalami. Moderate chronic small-vessel ischemic changes of the white matter. No hydrocephalus, hemorrhage or extra-axial collection. Vascular: Major vessels at the base of the brain show flow. Skull and upper cervical spine: Negative Sinuses/Orbits: Sinuses are clear.  Orbits are negative. Other: Small right mastoid effusion. IMPRESSION: 1. Several small/punctate acute infarctions at the right frontoparietal vertex. 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 This distribution would suggest embolic disease from the heart or ascending aorta. 2. Numerous old small vessel ischemic changes throughout the brain as outlined above. Electronically Signed   By: Paulina Fusi M.D.   On: 10/01/2020 17:32   DG CHEST PORT 1 VIEW  Result Date: 10/01/2020 CLINICAL DATA:  TIA EXAM: PORTABLE CHEST 1 VIEW COMPARISON:  02/12/2012 FINDINGS: Cardiomegaly. No confluent opacities or effusions. No acute bony abnormality. IMPRESSION: Cardiomegaly.  No active disease. Electronically Signed   By: Charlett Nose M.D.   On: 10/01/2020 17:54   ECHOCARDIOGRAM COMPLETE  Result Date: 10/02/2020    ECHOCARDIOGRAM REPORT   Patient Name:   Leslie [name] Date of Exam: 10/02/2020 Medical Rec #:  [id number]          Height:       62.0 in Accession #:    [id number]         Weight:       101.2 lb Date of Birth:  08/03/1926           BSA:          1.431 m Patient Age:    89 years           BP:           123/75 mmHg Patient Gender: F  HR:           78 bpm. Exam Location:  Inpatient Procedure: Color Doppler and Cardiac Doppler                            MODIFIED REPORT: This report  was modified by Dietrich Pates MD on 10/02/2020 due to Complete.  Indications:     Stroke  History:         Patient has no prior history of Echocardiogram examinations.  Sonographer:     Roosvelt Maser RDCS Referring Phys:  [id number] [facility] A SMITH Diagnosing Phys: Dietrich Pates MD IMPRESSIONS  1. LVEF is severely depressed with akinesis of the septum, inferior and apical walls; hypokinesis elsewhere. Left ventricular ejection fraction, by estimation, is 25%. The left ventricular internal cavity size was moderately dilated. Indeterminate diastolic filling due to E-A fusion.  2. Right ventricular systolic function is normal. The right ventricular size is normal.  3. Left atrial size was mildly dilated.  4. The mitral valve is abnormal. Mild mitral valve regurgitation.  5. The aortic valve is normal in structure. Aortic valve regurgitation is trivial.  6. The inferior vena cava is normal in size with greater than 50% respiratory variability, suggesting right atrial pressure of 3 mmHg. FINDINGS  Left Ventricle: LVEF is severely depressed with akinesis of the septum, inferior and apical walls; hypokinesis elsewhere. Left ventricular ejection fraction, by estimation, is 25%%. The left ventricle demonstrates regional wall motion abnormalities. 3D left ventricular ejection fraction analysis performed but not reported based on interpreter judgement due to suboptimal quality. The left ventricular internal cavity size was moderately dilated. There is no left ventricular hypertrophy. Indeterminate diastolic filling due to E-A fusion. Right Ventricle: The right ventricular size is normal. No increase in right ventricular wall thickness. Right ventricular systolic function is normal. Left Atrium: Left atrial size was mildly dilated. Right Atrium: Right atrial size was normal in size. Pericardium: Trivial pericardial effusion is present. Mitral Valve: The mitral valve is abnormal. There is mild thickening of the mitral valve leaflet(s). Mild  mitral annular calcification. Mild mitral valve regurgitation. Tricuspid Valve: The tricuspid valve is normal in structure. Tricuspid valve regurgitation is mild. Aortic Valve: The aortic valve is normal in structure. Aortic valve regurgitation is trivial. Aortic valve mean gradient measures 6.0 mmHg. Aortic valve peak gradient measures 11.3 mmHg. Aortic valve area, by VTI measures 1.43 cm. Pulmonic Valve: The pulmonic valve was normal in structure. Pulmonic valve regurgitation is trivial. Aorta: The aortic root is normal in size and structure. Venous: The inferior vena cava is normal in size with greater than 50% respiratory variability, suggesting right atrial pressure of 3 mmHg. IAS/Shunts: The interatrial septum was not assessed.  LEFT VENTRICLE PLAX 2D LVIDd:         5.60 cm LVIDs:         4.90 cm LV PW:         0.90 cm LV IVS:        0.90 cm LVOT diam:     1.80 cm  3D Volume EF: LV SV:         41       3D EF:        43 % LV SV Index:   28       LV EDV:       168 ml LVOT Area:     2.54 cm LV ESV:       96  ml                         LV SV:        72 ml RIGHT VENTRICLE          IVC RV Basal diam:  3.60 cm  IVC diam: 1.30 cm LEFT ATRIUM             Index       RIGHT ATRIUM           Index LA diam:        3.60 cm 2.52 cm/m  RA Area:     12.50 cm LA Vol (A2C):   70.1 ml 48.98 ml/m RA Volume:   27.50 ml  19.22 ml/m LA Vol (A4C):   47.7 ml 33.33 ml/m LA Biplane Vol: 57.9 ml 40.46 ml/m  AORTIC VALVE AV Area (Vmax):    1.43 cm AV Area (Vmean):   1.28 cm AV Area (VTI):     1.43 cm AV Vmax:           168.00 cm/s AV Vmean:          112.000 cm/s AV VTI:            0.285 m AV Peak Grad:      11.3 mmHg AV Mean Grad:      6.0 mmHg LVOT Vmax:         94.60 cm/s LVOT Vmean:        56.400 cm/s LVOT VTI:          0.160 m LVOT/AV VTI ratio: 0.56  AORTA Ao Root diam: 2.90 cm Ao Asc diam:  3.20 cm  SHUNTS Systemic VTI:  0.16 m Systemic Diam: 1.80 cm Dietrich Pates MD Electronically signed by Dietrich Pates MD Signature Date/Time:  10/02/2020/12:23:41 PM    Final (Updated)    CT HEAD CODE STROKE WO CONTRAST  Result Date: 10/01/2020 CLINICAL DATA:  Code stroke. Acute stroke presentation. Deficit not described. EXAM: CT HEAD WITHOUT CONTRAST TECHNIQUE: Contiguous axial images were obtained from the base of the skull through the vertex without intravenous contrast. COMPARISON:  None. FINDINGS: Brain: Generalized age related atrophy. Chronic appearing small vessel ischemic changes of the hemispheric white matter. Old lacunar infarction of the left thalamus. Old small vessel infarctions of the cerebellum. No finding of acute infarction. No mass, hemorrhage, hydrocephalus or extra-axial collection. Vascular: There is atherosclerotic calcification of the major vessels at the base of the brain. Skull: Negative Sinuses/Orbits: Clear/normal Other: None ASPECTS ([place]ber[place] Stroke Program Early CT Score) - Ganglionic level infarction (caudate, lentiform nuclei, internal capsule, insula, M1-M3 cortex): 7 - Supraganglionic infarction (M4-M6 cortex): 3 Total score (0-10 with 10 being normal): 10 IMPRESSION: 1. No acute finding by CT. Atrophy and chronic small-vessel ischemic changes. 2. ASPECTS is 10. 3. These results were communicated to Dr. Selina Cooley At 1:22 pmon 3/20/2022by text page via the [facility] messaging system. Electronically Signed   By: Paulina Fusi M.D.   On: 10/01/2020 13:22   VAS [place] LOWER EXTREMITY VENOUS (DVT)  Result Date: 10/02/2020  Lower Venous DVT Study Indications: Stroke.  Comparison Study: no prior Performing Technologist: Blanch Media RVS  Examination Guidelines: A complete evaluation includes B-mode imaging, spectral Doppler, color Doppler, and power Doppler as needed of all accessible portions of each vessel. Bilateral testing is considered an integral part of a complete examination. Limited examinations for reoccurring indications may be performed as noted. The reflux portion of the exam  is performed with the patient in reverse  Trendelenburg.  +---------+---------------+---------+-----------+----------+--------------+ RIGHT    CompressibilityPhasicitySpontaneityPropertiesThrombus Aging +---------+---------------+---------+-----------+----------+--------------+ CFV      Full           Yes      Yes                                 +---------+---------------+---------+-----------+----------+--------------+ SFJ      Full                                                        +---------+---------------+---------+-----------+----------+--------------+ FV Prox  Full                                                        +---------+---------------+---------+-----------+----------+--------------+ FV Mid   Full                                                        +---------+---------------+---------+-----------+----------+--------------+ FV DistalFull                                                        +---------+---------------+---------+-----------+----------+--------------+ PFV      Full                                                        +---------+---------------+---------+-----------+----------+--------------+ POP      Full           Yes      Yes                                 +---------+---------------+---------+-----------+----------+--------------+ PTV      Full                                                        +---------+---------------+---------+-----------+----------+--------------+ PERO     Full                                                        +---------+---------------+---------+-----------+----------+--------------+   +---------+---------------+---------+-----------+----------+--------------+ LEFT     CompressibilityPhasicitySpontaneityPropertiesThrombus Aging +---------+---------------+---------+-----------+----------+--------------+ CFV      Full           Yes      Yes                                  +---------+---------------+---------+-----------+----------+--------------+  SFJ      Full                                                        +---------+---------------+---------+-----------+----------+--------------+ FV Prox  Full                                                        +---------+---------------+---------+-----------+----------+--------------+ FV Mid   Full                                                        +---------+---------------+---------+-----------+----------+--------------+ FV DistalFull                                                        +---------+---------------+---------+-----------+----------+--------------+ PFV      Full                                                        +---------+---------------+---------+-----------+----------+--------------+ POP      Full           Yes      Yes                                 +---------+---------------+---------+-----------+----------+--------------+ PTV      Full                                                        +---------+---------------+---------+-----------+----------+--------------+ PERO     Full                                                        +---------+---------------+---------+-----------+----------+--------------+     Summary: BILATERAL: - No evidence of deep vein thrombosis seen in the lower extremities, bilaterally. - No evidence of superficial venous thrombosis in the lower extremities, bilaterally. -No evidence of popliteal cyst, bilaterally.   *See table(s) above for measurements and observations. Electronically signed by Lemar Livings MD on 10/02/2020 at 11:20:48 AM.    Final    CT ANGIO HEAD CODE STROKE  Result Date: 10/01/2020 CLINICAL DATA:  Slurred speech and facial droop. EXAM: CT ANGIOGRAPHY HEAD AND NECK TECHNIQUE: Multidetector CT imaging of the head and neck was performed using the standard protocol during bolus administration of intravenous  contrast. Multiplanar CT image reconstructions  and MIPs were obtained to evaluate the vascular anatomy. Carotid stenosis measurements (when applicable) are obtained utilizing NASCET criteria, using the distal internal carotid diameter as the denominator. CONTRAST:  26mL OMNIPAQUE IOHEXOL 350 MG/ML SOLN COMPARISON:  Head CT same day FINDINGS: CTA NECK FINDINGS Aortic arch: Aortic atherosclerosis and ectasia. Branching pattern is normal without origin stenosis. Right carotid system: Common carotid artery widely patent to the carotid bifurcation. Calcified plaque at the bifurcation and ICA bulb but no stenosis. Cervical ICA widely patent beyond that. Left carotid system: Common carotid artery widely patent to the bifurcation. Minimal calcified plaque at the carotid bifurcation. No stenosis. Cervical ICA widely patent. Vertebral arteries: Vertebral artery origins are widely patent. Vertebral arteries appear normal through the cervical region to the foramen magnum, though detail is somewhat limited due to extensive venous collateral flow adjacent to the vertebral arteries. Skeleton: Ordinary cervical spondylosis and facet osteoarthritis. Other neck: No mass or lymphadenopathy. Previous left thyroidectomy. Multiple nodules within the right lobe of the thyroid, the largest measuring 1.5 cm in diameter. Upper chest: Minimal emphysema in the upper lobes. Apparent occlusion of the innominate vein, possibly due to extrinsic compression between the arterial structures and right clavicular head. This is responsible for the pattern of venous drainage described above. Review of the MIP images confirms the above findings CTA HEAD FINDINGS Anterior circulation: Both internal carotid arteries are patent through the skull base and siphon regions. Ordinary mild siphon atherosclerotic calcification without stenosis. The anterior and middle cerebral vessels are patent. No large or medium vessel occlusion. No proximal stenosis. No  aneurysm or vascular malformation. Fetal origin of the right PCA. Posterior circulation: Both vertebral arteries are patent through the foramen magnum to the basilar. No basilar stenosis. Posterior circulation branch vessels are normal. Venous sinuses: Patent and normal. Anatomic variants: None other significant. Review of the MIP images confirms the above findings IMPRESSION: 1. Aortic atherosclerosis and ectasia. 2. Mild atherosclerotic change at both carotid bifurcations but without stenosis. 3. No intracranial large or medium vessel occlusion or correctable proximal stenosis. 4. Apparent occlusion of the innominate vein, possibly due to extrinsic compression between the arterial structures and right clavicular head. This is responsible for the pattern of venous collateral drainage described in the body of the report. 5. Previous left thyroidectomy. Multiple nodules within the right lobe of the thyroid, the largest measuring 1.4 cm in diameter. Recommend thyroid US (ref: J Am Coll Radiol. 2015 Feb;12(2): 143-50). Aortic Atherosclerosis (ICD10-I70.0) and Emphysema (ICD10-J43.9). Electronically Signed   By: Paulina Fusi M.D.   On: 10/01/2020 15:03   CT ANGIO NECK CODE STROKE  Result Date: 10/01/2020 CLINICAL DATA:  Slurred speech and facial droop. EXAM: CT ANGIOGRAPHY HEAD AND NECK TECHNIQUE: Multidetector CT imaging of the head and neck was performed using the standard protocol during bolus administration of intravenous contrast. Multiplanar CT image reconstructions and MIPs were obtained to evaluate the vascular anatomy. Carotid stenosis measurements (when applicable) are obtained utilizing NASCET criteria, using the distal internal carotid diameter as the denominator. CONTRAST:  80mL OMNIPAQUE IOHEXOL 350 MG/ML SOLN COMPARISON:  Head CT same day FINDINGS: CTA NECK FINDINGS Aortic arch: Aortic atherosclerosis and ectasia. Branching pattern is normal without origin stenosis. Right carotid system: Common  carotid artery widely patent to the carotid bifurcation. Calcified plaque at the bifurcation and ICA bulb but no stenosis. Cervical ICA widely patent beyond that. Left carotid system: Common carotid artery widely patent to the bifurcation. Minimal calcified plaque at the carotid bifurcation. No stenosis. Cervical  ICA widely patent. Vertebral arteries: Vertebral artery origins are widely patent. Vertebral arteries appear normal through the cervical region to the foramen magnum, though detail is somewhat limited due to extensive venous collateral flow adjacent to the vertebral arteries. Skeleton: Ordinary cervical spondylosis and facet osteoarthritis. Other neck: No mass or lymphadenopathy. Previous left thyroidectomy. Multiple nodules within the right lobe of the thyroid, the largest measuring 1.5 cm in diameter. Upper chest: Minimal emphysema in the upper lobes. Apparent occlusion of the innominate vein, possibly due to extrinsic compression between the arterial structures and right clavicular head. This is responsible for the pattern of venous drainage described above. Review of the MIP images confirms the above findings CTA HEAD FINDINGS Anterior circulation: Both internal carotid arteries are patent through the skull base and siphon regions. Ordinary mild siphon atherosclerotic calcification without stenosis. The anterior and middle cerebral vessels are patent. No large or medium vessel occlusion. No proximal stenosis. No aneurysm or vascular malformation. Fetal origin of the right PCA. Posterior circulation: Both vertebral arteries are patent through the foramen magnum to the basilar. No basilar stenosis. Posterior circulation branch vessels are normal. Venous sinuses: Patent and normal. Anatomic variants: None other significant. Review of the MIP images confirms the above findings IMPRESSION: 1. Aortic atherosclerosis and ectasia. 2. Mild atherosclerotic change at both carotid bifurcations but without stenosis.  3. No intracranial large or medium vessel occlusion or correctable proximal stenosis. 4. Apparent occlusion of the innominate vein, possibly due to extrinsic compression between the arterial structures and right clavicular head. This is responsible for the pattern of venous collateral drainage described in the body of the report. 5. Previous left thyroidectomy. Multiple nodules within the right lobe of the thyroid, the largest measuring 1.4 cm in diameter. Recommend thyroid US (ref: J Am Coll Radiol. 2015 Feb;12(2): 143-50). Aortic Atherosclerosis (ICD10-I70.0) and Emphysema (ICD10-J43.9). Electronically Signed   By: Paulina Fusi M.D.   On: 10/01/2020 15:03     Assessment and Plan:   87 year old female with acute stroke, multiple small embolic in appearance, with newly discovered ejection fraction of 25% in the setting of underlying left bundle branch block, chronic.  Acute systolic heart failure/cardiomyopathy -Unknown etiology, aortic atherosclerosis noted, patent anterior and posterior circulation. -Agree with Dr. Elvera Lennox that we are currently trying to allow for permissive hypertension in the setting of recent stroke.  Because of this, medications such as Entresto which may cause hypotension is not permissible at this time. -She does not appear to be fluid overloaded.  Small frame.  Thin. -At this time I would recommend holding off of any typical goal-directed medical therapy such as beta-blocker, angiotensin receptor blocker, spironolactone, SGLT2 inhibitor given the risk of worsening hypotension. -As an outpatient, continue to assess blood pressure and when she is out of the acute stroke zone in the next several days, perhaps addition of low-dose ARB and beta-blocker will be applicable. -She is not an invasive candidate.  Risk outweigh benefits given her acute stroke.  DNR status. -Agree with aspirin, clopidogrel, atorvastatin 20 (seems very reasonable given her advanced age and body weight) as  well as last LDL of 78.  Discussed with Dr. Elvera Lennox.  Risk Assessment/Risk Scores:       New York Heart Association (NYHA) Functional Class NYHA Class I     For questions or updates, please contact [facility] Please consult [email or web address] for contact info under    Signed, Donato Schultz, MD  10/02/2020 5:07 PM

## 2020-10-02 NOTE — Progress Notes (Addendum)
STROKE TEAM PROGRESS NOTE   HPI Leslie [name] is a 87 y.o. female with medical history significant of hypertension, hyperlipidemia, prediabetes, and vitamin D deficiency who developed slurred speech and left-sided facial droop while at church yesterday. Patient presented to the hospital as a code stroke.  CT brain was negative for any acute findings.  CTA head and neck did not note any large vessel occlusion.  NIHSS 2.  She was not a TPA candidate.  MRI brain revealed several small/punctate acute infarctions at the right frontoparietal vertex. 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  At baseline patient still lives alone and is able to complete most of her ADLs.  She routinely follows with primary care provider.    INTERVAL HISTORY Her son is at bedside.  She is awake, alert and oriented to person, place, and time. Afebrile. VSS. PERLA. EOMI, left facial droop.  FC x4, 4+/5 strength throughout.    Vitals:   10/02/20 0232 10/02/20 0453 10/02/20 0700 10/02/20 1100  BP: 104/61 107/71 123/75 131/84  Pulse: 71 86 77 89  Resp: 20 18 18    Temp: 98.1 F (36.7 C) 98.4 F (36.9 C) 98.2 F (36.8 C) 98.2 F (36.8 C)  TempSrc: Oral Oral Oral Oral  SpO2: 99% 97% 95% 95%  Weight:      Height:       CBC:  Recent Labs  Lab 10/01/20 1313 10/01/20 1320  WBC 8.9  --   NEUTROABS 7.0  --   HGB 12.3 12.9  HCT 37.2 38.0  MCV 88.2  --   PLT 444*  --    Basic Metabolic Panel:  Recent Labs  Lab 10/01/20 1313 10/01/20 1320  NA 132* 138  K 3.8 3.9  CL 100 102  CO2 25  --   GLUCOSE 108* 107*  BUN 19 21  CREATININE 0.88 0.80  CALCIUM 9.5  --    Lipid Panel:  Recent Labs  Lab 10/01/20 1801  CHOL 135  TRIG 51  HDL 47  CHOLHDL 2.9  VLDL 10  LDLCALC 78   HgbA1c:  Recent Labs  Lab 10/02/20 0454  HGBA1C 5.8*   Urine Drug Screen: No results for input(s): LABOPIA, COCAINSCRNUR, LABBENZ, AMPHETMU, THCU, LABBARB in the last 168 hours.  Alcohol Level No results for input(s):  ETH in the last 168 hours.  IMAGING past 24 hours MR BRAIN WO CONTRAST  Result Date: 10/01/2020  IMPRESSION:  1. Several small/punctate acute infarctions at the right frontoparietal vertex. 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 This distribution would suggest embolic disease from the heart or ascending aorta.  2. Numerous old small vessel ischemic changes throughout the brain as outlined above.   DG CHEST PORT 1 VIEW  Result Date: 10/01/2020 CLINICAL DATA:  TIA EXAM: PORTABLE CHEST 1 VIEW COMPARISON:  02/12/2012 FINDINGS: Cardiomegaly. No confluent opacities or effusions. No acute bony abnormality. IMPRESSION: Cardiomegaly.  No active disease. Electronically Signed   By: 02/14/2012 M.D.   On: 10/01/2020 17:54   ECHOCARDIOGRAM COMPLETE  Result Date: 10/02/2020 IMPRESSIONS   1. LVEF is severely depressed with akinesis of the septum, inferior and apical walls; hypokinesis elsewhere. Left ventricular ejection fraction, by estimation, is 25%. The left ventricular internal cavity size was moderately dilated. Indeterminate diastolic filling due to E-A fusion.   2. Right ventricular systolic function is normal. The right ventricular size is normal.   3. Left atrial size was mildly dilated.   4. The mitral valve is abnormal.  Mild mitral valve regurgitation.   5. The aortic valve is normal in structure. Aortic valve regurgitation is trivial.   6. The inferior vena cava is normal in size with greater than 50% respiratory variability, suggesting right atrial pressure of 3 mmHg.  VAS [place] LOWER EXTREMITY VENOUS (DVT)  Result Date: 10/02/2020 Summary: BILATERAL: - No evidence of deep vein thrombosis seen in the lower extremities, bilaterally. - No evidence of superficial venous thrombosis in the lower extremities, bilaterally. -No evidence of popliteal cyst, bilaterally.      CT ANGIO Head/ neck CODE STROKE  Result Date: 10/01/2020 IMPRESSION:  1. Aortic atherosclerosis and ectasia.   2. Mild atherosclerotic change at both carotid bifurcations but without stenosis.  3. No intracranial large or medium vessel occlusion or correctable proximal stenosis.  4. Apparent occlusion of the innominate vein, possibly due to extrinsic compression between the arterial structures and right clavicular head. This is responsible for the pattern of venous collateral drainage described in the body of the report.  5. Previous left thyroidectomy. Multiple nodules within the right lobe of the thyroid, the largest measuring 1.4 cm in diameter. Recommend thyroid US   PHYSICAL EXAM Constitutional: Appears well-developed and well-nourished.  Eyes: No scleral injection HENT: No OP obstrucion Head: Normocephalic.  Cardiovascular: Normal rate and regular rhythm.  Respiratory: Effort normal  GI: Soft.  No distension. There is no tenderness.  Skin: WDI  Neuro: Mental Status: Patient is awake, alert, oriented to person, place, month, year, and situation. Patient is able to give a clear and coherent history. Mild dysarthria No signs of aphasia or neglect Cranial Nerves: II: Visual Fields are full. Pupils are equal, round, and reactive to light.  III,IV, VI: EOMI without ptosis or diploplia.  V: Facial sensation is symmetric to light touch VII: left facial droop VIII: hearing is intact to voice X: Uvula elevates symmetrically XI: Shoulder shrug is symmetric. XII: tongue is midline without atrophy or fasciculations.  Motor: Tone is normal. Bulk is normal. 4/5 strength was present in all four extremities.  Sensory: Sensation is symmetric to light touch in the arms and legs.  Plantars: Toes are downgoing bilaterally.  Cerebellar: FNF and HKS are intact bilaterally.    ASSESSMENT/PLAN [name] is a 87 y.o. female with history of HTN, HLD, pre DMII, glaucoma, and Vit D deficiency presenting with slurred speech with a left facial droop.   Stroke: Small left cerebellum and several  punctate right frontoparietal vertex infarcts, etiology likely due to cardiomyopathy with low EF.  Occult A. fib also possible.  Code Stroke CT head No acute abnormality. Atrophy and chronic small-vessel ischemic changes. ASPECTS 10.   CTA head & neck Aortic atherosclerosis and ectasia.  No intracranial large vessel occlusion. Occlusion of the innominate vein  MRI  Several small/punctate acute infarctions at the right frontoparietal vertex. 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  LE venous dopplers: negative for DVT  2D Echo EF 25%, severely depressed LVEF with akinesis of the septum, inferior and apical walls, hypokinesis elsewhere.   LDL 78  HgbA1c 5.8  VTE prophylaxis - Lovenox  Diet: heart healthy  No antithrombotic prior to admission, now on aspirin 81 mg daily and clopidogrel 75 mg daily.  Given embolic stroke in the setting of low EF, recommend anticoagulation with DOAC for stroke prevention. Repeat TTE every 3 months, once EF > 35%, will d/c DOAC and start ASA  Therapy recommendations:  CIR  Disposition:  pending  Cardiomyopathy  EF  25%, severely depressed LVEF with akinesis of the septum, inferior and apical walls, hypokinesis elsewhere.  Cardiology on board  Given embolic stroke in the setting of low EF, recommend anticoagulation with DOAC for stroke prevention. Repeat TTE every 3 months, once EF > 35%, will d/c DOAC and start ASA  Hypertension  Home meds:  none  Stable  Long-term BP goal normotensive  Hyperlipidemia  Home meds:  none  LDL 78, goal < 70  Add atorvastatin 20mg  daily   Continue statin at discharge  UTI  UA (3/20): large LEs, positive nitrite, WBC >50  Culture: pending  Fosfomycin 3g oral given today  Other Stroke Risk Factors  Advanced Age >/= 2   Other Active Problems   Previous left thyroidectomy: multiple nodules within the right lobe of the thyroid.  Will need follow-up with PCP for further eval and  management  Hospital day # 1   ATTENDING NOTE: I reviewed above note and agree with the assessment and plan. Pt was seen and examined.   87 year old female with history of hypertension, hyperlipidemia, pre-diabetes admitted for waxing and weaning left facial droop and slurred speech.  CT no acute abnormality.  CTA head and neck no LVO.  MRI showed right MCA/PCA punctate infarcts as well as left cerebellum small infarct.  LE venous Doppler negative for DVT.  A1c 5.8, LDL 78.  2D echo showed EF 25% with hypokinesis.  UA WBC more than 50.  PT/OT recommend CIR.  Son at bedside, on exam, patient awake alert orientated x3, no aphasia, follows simple commands and slight dysarthria due to left facial droop.  Moving all extremities symmetrically, sensation symmetrical, finger-to-nose intact bilaterally.  Etiology for patient stroke likely due to cardiomyopathy with low EF.  Occult A. fib also possible.  Given stroke in the setting of low EF, recommend anticoagulation with DOAC for stroke prevention.  Repeat 2D echo every 3 months, once EF more than 75%, may discontinue DOAC and start aspirin.  Will further discuss with cardiology regarding this plan.  Agree with Lipitor 20.  For detailed assessment and plan, please refer to above as I have made changes wherever appropriate.   99, MD PhD Stroke Neurology 10/02/2020 6:23 PM     To contact Stroke Continuity provider, please refer to 10/04/2020. After hours, contact General Neurology

## 2020-10-02 NOTE — Progress Notes (Signed)
SLP Cancellation Note  Patient Details Name: Leslie [name] MRN: [id number] DOB: 06-18-1927   Cancelled treatment:       Reason Eval/Treat Not Completed: Other (comment) Attempted to see pt during her lunch meal, but she was too full from a late breakfast. She took a few small bites of her food and took sips of her water (without overt difficulties) but did not want to participate in full therapy session at this time. Will continue to f/u as able.    Mahala Menghini., M.A. CCC-SLP Acute Rehabilitation Services Pager [phone] Office [phone]  10/02/2020, 1:44 PM

## 2020-10-02 NOTE — Progress Notes (Signed)
MD on-call was notified of pt UA results and confusion with pt pulling telemetry and trying to get oob but no response from MD. Pt urine continue to be cloudy with strong odor. Will continue to closely monitor pt. Dionne Bucy RN

## 2020-10-02 NOTE — Progress Notes (Signed)
  Echocardiogram 2D Echocardiogram has been performed.  Roosvelt Maser F 10/02/2020, 8:32 AM

## 2020-10-02 NOTE — Progress Notes (Signed)
Inpatient Rehab Admissions Coordinator Note:   Per PT/OT recommendations, pt was screened for CIR candidacy by Wolfgang Phoenix, MS, CCC-SLP.  At this time we are recommending an inpatient rehab consult. AC will place consult.  Please contact me with questions.    Wolfgang Phoenix, MS, CCC-SLP Admissions Coordinator [phone] 10/02/20 3:36 PM

## 2020-10-02 NOTE — Evaluation (Signed)
Physical Therapy Evaluation Patient Details Name: Leslie [name] MRN: [id number] DOB: Nov 25, 1926 Today's Date: 10/02/2020   History of Present Illness  Patient is a 87 y/o female who [place] ED from church 3/20 after being noticed with slurred speech and left sided facial droop.CT brain negative but MRI showed several small/punctate acute infarctions at the right frontoparietal vertex. 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 PMH: HTN, HLD, prediabetes and vitamin d deficiency    Clinical Impression  Pt admitted with above diagnosis. Pt currently with functional limitations due to the deficits listed below (see PT Problem List). At the time of PT eval pt was able to perform transfers and ambulation with gross min assist for balance support and safety. Pt endorses dizziness with head turns during gait training. Noted some cognitive deficits as well, and does not seem to be fully aware of safety and her current deficits. Reports she is completely back to normal, including her speech, even though she still has a very notable slur. Son present and reports they will likely be able to work out 24 hour assistance at d/c if needed. As pt was independent PTA, recommending CIR level rehab to maximize functional independence and safety. Feel this pt will likely be able to reach mod I level from a physical standpoint with a short stay in rehab. Acutely, pt will benefit from skilled PT to increase their independence and safety with mobility to allow discharge to the venue listed below.       Follow Up Recommendations CIR    Equipment Recommendations  None recommended by PT    Recommendations for Other Services Rehab consult     Precautions / Restrictions Precautions Precautions: Fall Restrictions Weight Bearing Restrictions: No      Mobility  Bed Mobility Overal bed mobility: Needs Assistance Bed Mobility: Supine to Sit     Supine to sit: Supervision;HOB elevated     General  bed mobility comments: Increased time to complete. Noted increased processing time to figure out how to get feet out from under covers. Arms tangled in lines under the blankets. Gross supervision overall.    Transfers Overall transfer level: Needs assistance Equipment used: 1 person hand held assist Transfers: Sit to/from Stand Sit to Stand: Min assist         General transfer comment: Assist required to gain/maintain standing balance EOB.  Ambulation/Gait Ambulation/Gait assistance: Min assist Gait Distance (Feet): 175 Feet Assistive device: 1 person hand held assist Gait Pattern/deviations: Step-through pattern;Decreased stride length;Drifts right/left;Trunk flexed Gait velocity: Decreased Gait velocity interpretation: <1.8 ft/sec, indicate of risk for recurrent falls General Gait Details: Pt reporting she is off balance because she is walking in hospital socks instead of shoes. With head turns, pt reports dizziness and balance deficits more prominent. Min assist throughout gait training at gait belt.  Stairs            Wheelchair Mobility    Modified Rankin (Stroke Patients Only)       Balance Overall balance assessment: Needs assistance Sitting-balance support: No upper extremity supported;Feet supported Sitting balance-Leahy Scale: Good     Standing balance support: No upper extremity supported;During functional activity Standing balance-Leahy Scale: Fair Standing balance comment: min guard for safety/balance                             Pertinent Vitals/Pain Pain Assessment: No/denies pain    Home Living Family/patient expects to be discharged to:: Private  residence Living Arrangements: Alone Available Help at Discharge: Family;Neighbor;Friend(s);Available PRN/intermittently Type of Home: House Home Access: Stairs to enter Entrance Stairs-Rails: Right Entrance Stairs-Number of Steps: 2 Home Layout: Two level;Able to live on main level with  bedroom/bathroom Home Equipment: Shower seat;Walker - 2 wheels;Grab bars - toilet Additional Comments: reports usually takes baths in the tub    Prior Function Level of Independence: Independent         Comments: Neighbors bring food in a lot for her, and she warms it up in the microwave. Goes out to lunch almost daily. Drives.     Hand Dominance   Dominant Hand: Right    Extremity/Trunk Assessment   Upper Extremity Assessment Upper Extremity Assessment: Defer to OT evaluation LUE Deficits / Details: mild dysmetria (fine and gross motor), decreased sesnsation distally LUE Sensation: decreased light touch LUE Coordination: decreased fine motor;decreased gross motor    Lower Extremity Assessment Lower Extremity Assessment: Generalized weakness    Cervical / Trunk Assessment Cervical / Trunk Assessment: Kyphotic  Communication   Communication: Expressive difficulties (slurred speech)  Cognition Arousal/Alertness: Awake/alert Behavior During Therapy: WFL for tasks assessed/performed Overall Cognitive Status: Impaired/Different from baseline Area of Impairment: Memory;Problem solving;Safety/judgement                 Orientation Level: Disoriented to;Place;Time (Cannot recall name of hospital but knows she is in one; does not know day of the week or day of the month)   Memory: Decreased short-term memory   Safety/Judgement: Decreased awareness of deficits;Decreased awareness of safety   Problem Solving: Slow processing;Requires verbal cues General Comments: some slow processing and decreased problem sovling, decreased memory but able to self correct month (originally voicing january)      General Comments General comments (skin integrity, edema, etc.): son present and supportive, pt will have 24/7 support at home if needed    Exercises     Assessment/Plan    PT Assessment Patient needs continued PT services  PT Problem List Decreased strength;Decreased  activity tolerance;Decreased balance;Decreased mobility;Decreased coordination;Decreased cognition;Decreased knowledge of use of DME;Decreased safety awareness;Decreased knowledge of precautions       PT Treatment Interventions DME instruction;Gait training;Stair training;Functional mobility training;Therapeutic activities;Therapeutic exercise;Neuromuscular re-education;Cognitive remediation;Patient/family education;Balance training    PT Goals (Current goals can be found in the Care Plan section)  Acute Rehab PT Goals Patient Stated Goal: to get home PT Goal Formulation: With patient/family Time For Goal Achievement: 10/16/20 Potential to Achieve Goals: Good    Frequency Min 4X/week   Barriers to discharge        Co-evaluation               AM-PAC PT "6 Clicks" Mobility  Outcome Measure Help needed turning from your back to your side while in a flat bed without using bedrails?: None Help needed moving from lying on your back to sitting on the side of a flat bed without using bedrails?: A Little Help needed moving to and from a bed to a chair (including a wheelchair)?: A Little Help needed standing up from a chair using your arms (e.g., wheelchair or bedside chair)?: A Little Help needed to walk in hospital room?: A Little Help needed climbing 3-5 steps with a railing? : A Little 6 Click Score: 19    End of Session Equipment Utilized During Treatment: Gait belt Activity Tolerance: Patient tolerated treatment well Patient left: in chair;with call bell/phone within reach;with family/visitor present (OT entering to begin session) Nurse Communication: Mobility status PT Visit  Diagnosis: Unsteadiness on feet (R26.81);Difficulty in walking, not elsewhere classified (R26.2)    Time: 7209-4709 PT Time Calculation (min) (ACUTE ONLY): 34 min   Charges:   PT Evaluation $PT Eval Low Complexity: 1 Low PT Treatments $Gait Training: 8-22 mins        Conni Slipper, PT,  DPT Acute Rehabilitation Services Pager: [phone] Office: [phone]   Marylynn Pearson 10/02/2020, 1:47 PM

## 2020-10-02 NOTE — Consult Note (Shared)
Physical Medicine and Rehabilitation Consult   Reason for Consult: Stroke with functional deficits.  Referring Physician: Dr. Wyonia Hough   HPI: Leslie [name] is a 87 y.o. RH-female with history of glaucoma, T2DM, Vitamin D deficiency, chronic back/neck pain (gets manipulation twice a month) who was admitted on 10/01/20 with slurred speech and facial droop after church service.   CTA head/neck showed apparent occlusion of innominate vein possibly due to extensive compression and multiple right thyroid nodules.  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  2D echo showed severely depressed LVEF with EF 25% and akinesis of septum, inferior and apical wall and hypokinesis elsewhere. BLE Dopplers were negative for DVT.  Neurology recommended DAPT and patient treated with fosfomycin due to evidence of UTI. Therapy evaluations done revealing dizziness with head turns as well as cognitive deficits with poor safety awareness and decreased posture.  CIR recommended due to functional decline.   Review of Systems  Constitutional: Negative for chills and fever.  HENT: Positive for hearing loss.   Eyes: Negative for blurred vision and double vision.  Respiratory: Negative for cough and sputum production.   Cardiovascular: Negative for chest pain and palpitations.  Gastrointestinal: Negative for constipation and heartburn.  Genitourinary: Negative for dysuria and urgency.  Musculoskeletal: Positive for back pain. Negative for myalgias and neck pain.  Skin: Negative for rash.  Neurological: Positive for speech change.  Psychiatric/Behavioral: Positive for memory loss.     Past Medical History:  Diagnosis Date  . Diabetes (HCC)   . Glaucoma   . HTN (hypertension)   . Hyperlipidemia   . Vitamin D deficiency      Past Surgical History:  Procedure Laterality  Date  . GLAUCOMA SURGERY  2013  . TONSILLECTOMY      Family History  Problem Relation Age of Onset  . Heart attack Brother   . Heart disease Brother   . Heart attack Brother   . Heart disease Brother   . Hypertension Mother   . Stroke Mother   . Heart attack Father   . Diabetes Father     Social History:  Lives alone. Independent without AD. Has 2 sons who live OOT--visits on holidays or special occasions. She smoked for 30 years and she reports that she quit smoking about 41 years ago. She has never used smokeless tobacco. She reports that she does not drink alcohol and does not use drugs.     Allergies  Allergen Reactions  . Adhesive [Tape]   . Advil [Ibuprofen]   . Atenolol     Medications Prior to Admission  Medication Sig Dispense Refill  . Cholecalciferol (VITAMIN D3) 125 MCG (5000 UT) CAPS Take 5,000 Units by mouth daily.    . hydrocortisone (ANUSOL-HC) 2.5 % rectal cream Place 1 application rectally 2 (two) times daily. 30 g 0  . latanoprost (XALATAN) 0.005 % ophthalmic solution Place 1 drop into both eyes daily.    . SENNA PO Take 1 tablet by mouth daily.    . hydrocortisone (ANUSOL-HC) 25 MG suppository Place 1 suppository (25 mg total) rectally 2 (two) times daily. (Patient not taking: No sig reported) 12 suppository 0    Home: Home Living Family/patient expects to be discharged to:: Private residence Living Arrangements: Alone Available Help at Discharge: Family,Neighbor,Friend(s),Available PRN/intermittently Type of Home: House Home Access: Stairs to enter [employer] of Steps: 2 Entrance Stairs-Rails:  Right Home Layout: Two level,Able to live on main level with bedroom/bathroom Alternate Level Stairs-Number of Steps: flight Bathroom Shower/Tub: Walk-in shower,Tub/shower unit [employer]: Handicapped height Home Equipment: Engineer, production - 2 wheels,Grab bars - toilet Additional Comments: reports usually takes baths in the tub  Lives  With: Alone  Functional History: Prior Function Level of Independence: Independent Comments: Neighbors bring food in a lot for her, and she warms it up in the microwave. Goes out to lunch almost daily. Drives. Functional Status:  Mobility: Bed Mobility Overal bed mobility: Needs Assistance Bed Mobility: Supine to Sit Supine to sit: Supervision,HOB elevated General bed mobility comments: Increased time to complete. Noted increased processing time to figure out how to get feet out from under covers. Arms tangled in lines under the blankets. Gross supervision overall. Transfers Overall transfer level: Needs assistance Equipment used: 1 person hand held assist Transfers: Sit to/from Stand Sit to Stand: Min assist General transfer comment: Assist required to gain/maintain standing balance EOB. Ambulation/Gait Ambulation/Gait assistance: Min assist Gait Distance (Feet): 175 Feet Assistive device: 1 person hand held assist Gait Pattern/deviations: Step-through pattern,Decreased stride length,Drifts right/left,Trunk flexed General Gait Details: Pt reporting she is off balance because she is walking in hospital socks instead of shoes. With head turns, pt reports dizziness and balance deficits more prominent. Min assist throughout gait training at gait belt. Gait velocity: Decreased Gait velocity interpretation: <1.8 ft/sec, indicate of risk for recurrent falls    ADL: ADL Overall ADL's : Needs assistance/impaired Grooming: Min guard,Standing Upper Body Bathing: Set up,Sitting Lower Body Bathing: Min guard,Sit to/from stand Upper Body Dressing : Set up,Sitting Lower Body Dressing: Minimal assistance,Sit to/from stand Toilet Transfer: Min guard,Ambulation,Grab bars,Regular Toilet Toileting- Clothing Manipulation and Hygiene: Minimal assistance,Sit to/from stand Toileting - Clothing Manipulation Details (indicate cue type and reason): clothing mgmt Functional mobility during ADLs: Min  guard General ADL Comments: patient limited by generalized weakness, balance  Cognition: Cognition Overall Cognitive Status: Impaired/Different from baseline Arousal/Alertness: Awake/alert Orientation Level: Oriented to person Attention: Sustained Sustained Attention: Appears intact Memory: Impaired Memory Impairment: Decreased recall of new information Awareness: Impaired Awareness Impairment: Intellectual impairment Problem Solving: Appears intact (intact at basic level) Safety/Judgment: Appears intact Cognition Arousal/Alertness: Awake/alert Behavior During Therapy: WFL for tasks assessed/performed Overall Cognitive Status: Impaired/Different from baseline Area of Impairment: Memory,Problem solving,Safety/judgement Orientation Level: Disoriented to,Place,Time (Cannot recall name of hospital but knows she is in one; does not know day of the week or day of the month) Memory: Decreased short-term memory Safety/Judgement: Decreased awareness of deficits,Decreased awareness of safety Problem Solving: Slow processing,Requires verbal cues General Comments: some slow processing and decreased problem sovling, decreased memory but able to self correct month (originally voicing january)   Blood pressure 123/78, pulse 85, temperature 98 F (36.7 C), temperature source Oral, resp. rate 16, height  (1.575 m), weight 45.9 kg, SpO2 96 %. Physical Exam Vitals and nursing note reviewed.  Skin:    Comments: Dry flaky skin.   Neurological:     Mental Status: She is alert.     Comments: HOH. Left facial weakness and dysarthria noted with increase in verbal output. Needed minimal cues for situation. Mild dysarthria.     Results for orders placed or performed during the hospital encounter of 10/01/20 (from the past 24 hour(s))  Resp Panel by RT-PCR (Flu A&B, Covid) Nasopharyngeal Swab     Status: None   Collection Time: 10/01/20  5:54 PM   Specimen: Nasopharyngeal Swab; Nasopharyngeal(NP)  swabs in vial transport medium  Result Value Ref Range   SARS Coronavirus 2 by RT PCR NEGATIVE NEGATIVE   Influenza A by PCR NEGATIVE NEGATIVE   Influenza B by PCR NEGATIVE NEGATIVE  Lipid panel     Status: None   Collection Time: 10/01/20  6:01 PM  Result Value Ref Range   Cholesterol 135 0 - 200 mg/dL   Triglycerides 51 <542 mg/dL   HDL 47 >70 mg/dL   Total CHOL/HDL Ratio 2.9 RATIO   VLDL 10 0 - 40 mg/dL   LDL Cholesterol 78 0 - 99 mg/dL  Lactic acid, plasma     Status: None   Collection Time: 10/01/20  6:01 PM  Result Value Ref Range   Lactic Acid, Venous 0.9 0.5 - 1.9 mmol/L  CK     Status: None   Collection Time: 10/01/20  6:01 PM  Result Value Ref Range   Total CK 55 38 - 234 U/L  CBG monitoring, ED     Status: Abnormal   Collection Time: 10/01/20  6:53 PM  Result Value Ref Range   Glucose-Capillary 105 (H) 70 - 99 mg/dL   Comment 1 Notify RN   Urinalysis, Routine w reflex microscopic Urine, Clean Catch     Status: Abnormal   Collection Time: 10/01/20  8:40 PM  Result Value Ref Range   Color, Urine YELLOW YELLOW   APPearance HAZY (A) CLEAR   Specific Gravity, Urine >1.046 (H) 1.005 - 1.030   pH 6.0 5.0 - 8.0   Glucose, UA NEGATIVE NEGATIVE mg/dL   Hgb urine dipstick SMALL (A) NEGATIVE   Bilirubin Urine NEGATIVE NEGATIVE   Ketones, ur NEGATIVE NEGATIVE mg/dL   Protein, ur NEGATIVE NEGATIVE mg/dL   Nitrite POSITIVE (A) NEGATIVE   Leukocytes,Ua LARGE (A) NEGATIVE   RBC / HPF 11-20 0 - 5 RBC/hpf   WBC, UA >50 (H) 0 - 5 WBC/hpf   Bacteria, UA RARE (A) NONE SEEN   Squamous Epithelial / LPF 0-5 0 - 5  Hemoglobin A1c     Status: Abnormal   Collection Time: 10/02/20  4:54 AM  Result Value Ref Range   Hgb A1c MFr Bld 5.8 (H) 4.8 - 5.6 %   Mean Plasma Glucose 119.76 mg/dL  TSH     Status: None   Collection Time: 10/02/20  4:54 AM  Result Value Ref Range   TSH 2.076 0.350 - 4.500 uIU/mL   MR BRAIN WO CONTRAST  Result Date: 10/01/2020 CLINICAL DATA:  Slurred speech  and left face droop beginning today. EXAM: MRI HEAD WITHOUT CONTRAST TECHNIQUE: Multiplanar, multiecho pulse sequences of the brain and surrounding structures were obtained without intravenous contrast. COMPARISON:  CT studies same day. FINDINGS: Brain: Small acute infarction in the left lateral cerebellum. Several punctate acute infarctions at the right frontoparietal vertex affecting the cortex and subcortical white matter. No confluent large vessel infarction. Generalized age related atrophy. Chronic small-vessel ischemic changes of the pons. Numerous old small vessel cerebellar infarctions. Old small vessel infarctions of the thalami. Moderate chronic small-vessel ischemic changes of the white matter. No hydrocephalus, hemorrhage or extra-axial collection. Vascular: Major vessels at the base of the brain show flow. Skull and upper cervical spine: Negative Sinuses/Orbits: Sinuses are clear.  Orbits are negative. Other: Small right mastoid effusion. IMPRESSION: 1. Several small/punctate acute infarctions at the right frontoparietal vertex. 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 This distribution would suggest embolic disease from the heart or ascending aorta. 2. Numerous old small vessel ischemic changes throughout the brain  as outlined above. Electronically Signed   By: Paulina Fusi M.D.   On: 10/01/2020 17:32   DG CHEST PORT 1 VIEW  Result Date: 10/01/2020 CLINICAL DATA:  TIA EXAM: PORTABLE CHEST 1 VIEW COMPARISON:  02/12/2012 FINDINGS: Cardiomegaly. No confluent opacities or effusions. No acute bony abnormality. IMPRESSION: Cardiomegaly.  No active disease. Electronically Signed   By: Charlett Nose M.D.   On: 10/01/2020 17:54   ECHOCARDIOGRAM COMPLETE  Result Date: 10/02/2020    ECHOCARDIOGRAM REPORT   Patient Name:   [name] Date of Exam: 10/02/2020 Medical Rec #:  [id number]          Height:       62.0 in Accession #:    [id number]         Weight:       101.2 lb Date of Birth:   July 22, 1926           BSA:          1.431 m Patient Age:    89 years           BP:           123/75 mmHg Patient Gender: F                  HR:           78 bpm. Exam Location:  Inpatient Procedure: Color Doppler and Cardiac Doppler                            MODIFIED REPORT: This report was modified by Dietrich Pates MD on 10/02/2020 due to Complete.  Indications:     Stroke  History:         Patient has no prior history of Echocardiogram examinations.  Sonographer:     Roosvelt Maser RDCS Referring Phys:  [id number] [facility] A SMITH Diagnosing Phys: Dietrich Pates MD IMPRESSIONS  1. LVEF is severely depressed with akinesis of the septum, inferior and apical walls; hypokinesis elsewhere. Left ventricular ejection fraction, by estimation, is 25%. The left ventricular internal cavity size was moderately dilated. Indeterminate diastolic filling due to E-A fusion.  2. Right ventricular systolic function is normal. The right ventricular size is normal.  3. Left atrial size was mildly dilated.  4. The mitral valve is abnormal. Mild mitral valve regurgitation.  5. The aortic valve is normal in structure. Aortic valve regurgitation is trivial.  6. The inferior vena cava is normal in size with greater than 50% respiratory variability, suggesting right atrial pressure of 3 mmHg. FINDINGS  Left Ventricle: LVEF is severely depressed with akinesis of the septum, inferior and apical walls; hypokinesis elsewhere. Left ventricular ejection fraction, by estimation, is 25%%. The left ventricle demonstrates regional wall motion abnormalities. 3D left ventricular ejection fraction analysis performed but not reported based on interpreter judgement due to suboptimal quality. The left ventricular internal cavity size was moderately dilated. There is no left ventricular hypertrophy. Indeterminate diastolic filling due to E-A fusion. Right Ventricle: The right ventricular size is normal. No increase in right ventricular wall thickness. Right ventricular  systolic function is normal. Left Atrium: Left atrial size was mildly dilated. Right Atrium: Right atrial size was normal in size. Pericardium: Trivial pericardial effusion is present. Mitral Valve: The mitral valve is abnormal. There is mild thickening of the mitral valve leaflet(s). Mild mitral annular calcification. Mild mitral valve regurgitation. Tricuspid Valve: The tricuspid valve is normal in structure. Tricuspid valve  regurgitation is mild. Aortic Valve: The aortic valve is normal in structure. Aortic valve regurgitation is trivial. Aortic valve mean gradient measures 6.0 mmHg. Aortic valve peak gradient measures 11.3 mmHg. Aortic valve area, by VTI measures 1.43 cm. Pulmonic Valve: The pulmonic valve was normal in structure. Pulmonic valve regurgitation is trivial. Aorta: The aortic root is normal in size and structure. Venous: The inferior vena cava is normal in size with greater than 50% respiratory variability, suggesting right atrial pressure of 3 mmHg. IAS/Shunts: The interatrial septum was not assessed.  LEFT VENTRICLE PLAX 2D LVIDd:         5.60 cm LVIDs:         4.90 cm LV PW:         0.90 cm LV IVS:        0.90 cm LVOT diam:     1.80 cm  3D Volume EF: LV SV:         41       3D EF:        43 % LV SV Index:   28       LV EDV:       168 ml LVOT Area:     2.54 cm LV ESV:       96 ml                         LV SV:        72 ml RIGHT VENTRICLE          IVC RV Basal diam:  3.60 cm  IVC diam: 1.30 cm LEFT ATRIUM             Index       RIGHT ATRIUM           Index LA diam:        3.60 cm 2.52 cm/m  RA Area:     12.50 cm LA Vol (A2C):   70.1 ml 48.98 ml/m RA Volume:   27.50 ml  19.22 ml/m LA Vol (A4C):   47.7 ml 33.33 ml/m LA Biplane Vol: 57.9 ml 40.46 ml/m  AORTIC VALVE AV Area (Vmax):    1.43 cm AV Area (Vmean):   1.28 cm AV Area (VTI):     1.43 cm AV Vmax:           168.00 cm/s AV Vmean:          112.000 cm/s AV VTI:            0.285 m AV Peak Grad:      11.3 mmHg AV Mean Grad:      6.0 mmHg  LVOT Vmax:         94.60 cm/s LVOT Vmean:        56.400 cm/s LVOT VTI:          0.160 m LVOT/AV VTI ratio: 0.56  AORTA Ao Root diam: 2.90 cm Ao Asc diam:  3.20 cm  SHUNTS Systemic VTI:  0.16 m Systemic Diam: 1.80 cm Dietrich Pates MD Electronically signed by Dietrich Pates MD Signature Date/Time: 10/02/2020/12:23:41 PM    Final (Updated)    CT HEAD CODE STROKE WO CONTRAST  Result Date: 10/01/2020 CLINICAL DATA:  Code stroke. Acute stroke presentation. Deficit not described. EXAM: CT HEAD WITHOUT CONTRAST TECHNIQUE: Contiguous axial images were obtained from the base of the skull through the vertex without intravenous contrast. COMPARISON:  None. FINDINGS: Brain: Generalized age related atrophy. Chronic appearing  small vessel ischemic changes of the hemispheric white matter. Old lacunar infarction of the left thalamus. Old small vessel infarctions of the cerebellum. No finding of acute infarction. No mass, hemorrhage, hydrocephalus or extra-axial collection. Vascular: There is atherosclerotic calcification of the major vessels at the base of the brain. Skull: Negative Sinuses/Orbits: Clear/normal Other: None ASPECTS ([place]berta Stroke Program Early CT Score) - Ganglionic level infarction (caudate, lentiform nuclei, internal capsule, insula, M1-M3 cortex): 7 - Supraganglionic infarction (M4-M6 cortex): 3 Total score (0-10 with 10 being normal): 10 IMPRESSION: 1. No acute finding by CT. Atrophy and chronic small-vessel ischemic changes. 2. ASPECTS is 10. 3. These results were communicated to Dr. Selina CooleyStack At 1:22 pmon 3/20/2022by text page via the [facility]. Electronically Signed   By: Paulina FusiMark  Shogry M.D.   On: 10/01/2020 13:22   VAS US LOWER EXTREMITY VENOUS (DVT)  Result Date: 10/02/2020  Lower Venous DVT Study Indications: Stroke.  Comparison Study: no prior Performing Technologist: Blanch MediaMegan Riddle RVS  Examination Guidelines: A complete evaluation includes B-mode imaging, spectral Doppler, color Doppler, and power  Doppler as needed of all accessible portions of each vessel. Bilateral testing is considered an integral part of a complete examination. Limited examinations for reoccurring indications may be performed as noted. The reflux portion of the exam is performed with the patient in reverse Trendelenburg.  +---------+---------------+---------+-----------+----------+--------------+ RIGHT    CompressibilityPhasicitySpontaneityPropertiesThrombus Aging +---------+---------------+---------+-----------+----------+--------------+ CFV      Full           Yes      Yes                                 +---------+---------------+---------+-----------+----------+--------------+ SFJ      Full                                                        +---------+---------------+---------+-----------+----------+--------------+ FV Prox  Full                                                        +---------+---------------+---------+-----------+----------+--------------+ FV Mid   Full                                                        +---------+---------------+---------+-----------+----------+--------------+ FV DistalFull                                                        +---------+---------------+---------+-----------+----------+--------------+ PFV      Full                                                        +---------+---------------+---------+-----------+----------+--------------+  POP      Full           Yes      Yes                                 +---------+---------------+---------+-----------+----------+--------------+ PTV      Full                                                        +---------+---------------+---------+-----------+----------+--------------+ PERO     Full                                                        +---------+---------------+---------+-----------+----------+--------------+    +---------+---------------+---------+-----------+----------+--------------+ LEFT     CompressibilityPhasicitySpontaneityPropertiesThrombus Aging +---------+---------------+---------+-----------+----------+--------------+ CFV      Full           Yes      Yes                                 +---------+---------------+---------+-----------+----------+--------------+ SFJ      Full                                                        +---------+---------------+---------+-----------+----------+--------------+ FV Prox  Full                                                        +---------+---------------+---------+-----------+----------+--------------+ FV Mid   Full                                                        +---------+---------------+---------+-----------+----------+--------------+ FV DistalFull                                                        +---------+---------------+---------+-----------+----------+--------------+ PFV      Full                                                        +---------+---------------+---------+-----------+----------+--------------+ POP      Full           Yes      Yes                                 +---------+---------------+---------+-----------+----------+--------------+  PTV      Full                                                        +---------+---------------+---------+-----------+----------+--------------+ PERO     Full                                                        +---------+---------------+---------+-----------+----------+--------------+     Summary: BILATERAL: - No evidence of deep vein thrombosis seen in the lower extremities, bilaterally. - No evidence of superficial venous thrombosis in the lower extremities, bilaterally. -No evidence of popliteal cyst, bilaterally.   *See table(s) above for measurements and observations. Electronically signed by Lemar Livings MD on 10/02/2020 at  11:20:48 AM.    Final    CT ANGIO HEAD CODE STROKE  Result Date: 10/01/2020 CLINICAL DATA:  Slurred speech and facial droop. EXAM: CT ANGIOGRAPHY HEAD AND NECK TECHNIQUE: Multidetector CT imaging of the head and neck was performed using the standard protocol during bolus administration of intravenous contrast. Multiplanar CT image reconstructions and MIPs were obtained to evaluate the vascular anatomy. Carotid stenosis measurements (when applicable) are obtained utilizing NASCET criteria, using the distal internal carotid diameter as the denominator. CONTRAST:  75mL OMNIPAQUE IOHEXOL 350 MG/ML SOLN COMPARISON:  Head CT same day FINDINGS: CTA NECK FINDINGS Aortic arch: Aortic atherosclerosis and ectasia. Branching pattern is normal without origin stenosis. Right carotid system: Common carotid artery widely patent to the carotid bifurcation. Calcified plaque at the bifurcation and ICA bulb but no stenosis. Cervical ICA widely patent beyond that. Left carotid system: Common carotid artery widely patent to the bifurcation. Minimal calcified plaque at the carotid bifurcation. No stenosis. Cervical ICA widely patent. Vertebral arteries: Vertebral artery origins are widely patent. Vertebral arteries appear normal through the cervical region to the foramen magnum, though detail is somewhat limited due to extensive venous collateral flow adjacent to the vertebral arteries. Skeleton: Ordinary cervical spondylosis and facet osteoarthritis. Other neck: No mass or lymphadenopathy. Previous left thyroidectomy. Multiple nodules within the right lobe of the thyroid, the largest measuring 1.5 cm in diameter. Upper chest: Minimal emphysema in the upper lobes. Apparent occlusion of the innominate vein, possibly due to extrinsic compression between the arterial structures and right clavicular head. This is responsible for the pattern of venous drainage described above. Review of the MIP images confirms the above findings CTA HEAD  FINDINGS Anterior circulation: Both internal carotid arteries are patent through the skull base and siphon regions. Ordinary mild siphon atherosclerotic calcification without stenosis. The anterior and middle cerebral vessels are patent. No large or medium vessel occlusion. No proximal stenosis. No aneurysm or vascular malformation. Fetal origin of the right PCA. Posterior circulation: Both vertebral arteries are patent through the foramen magnum to the basilar. No basilar stenosis. Posterior circulation branch vessels are normal. Venous sinuses: Patent and normal. Anatomic variants: None other significant. Review of the MIP images confirms the above findings IMPRESSION: 1. Aortic atherosclerosis and ectasia. 2. Mild atherosclerotic change at both carotid bifurcations but without stenosis. 3. No intracranial large or medium vessel occlusion or correctable proximal stenosis. 4. Apparent occlusion of the innominate vein, possibly due to  extrinsic compression between the arterial structures and right clavicular head. This is responsible for the pattern of venous collateral drainage described in the body of the report. 5. Previous left thyroidectomy. Multiple nodules within the right lobe of the thyroid, the largest measuring 1.4 cm in diameter. Recommend thyroid US (ref: J Am Coll Radiol. 2015 Feb;12(2): 143-50). Aortic Atherosclerosis (ICD10-I70.0) and Emphysema (ICD10-J43.9). Electronically Signed   By: Paulina Fusi M.D.   On: 10/01/2020 15:03   CT ANGIO NECK CODE STROKE  Result Date: 10/01/2020 CLINICAL DATA:  Slurred speech and facial droop. EXAM: CT ANGIOGRAPHY HEAD AND NECK TECHNIQUE: Multidetector CT imaging of the head and neck was performed using the standard protocol during bolus administration of intravenous contrast. Multiplanar CT image reconstructions and MIPs were obtained to evaluate the vascular anatomy. Carotid stenosis measurements (when applicable) are obtained utilizing NASCET criteria, using  the distal internal carotid diameter as the denominator. CONTRAST:  50mL OMNIPAQUE IOHEXOL 350 MG/ML SOLN COMPARISON:  Head CT same day FINDINGS: CTA NECK FINDINGS Aortic arch: Aortic atherosclerosis and ectasia. Branching pattern is normal without origin stenosis. Right carotid system: Common carotid artery widely patent to the carotid bifurcation. Calcified plaque at the bifurcation and ICA bulb but no stenosis. Cervical ICA widely patent beyond that. Left carotid system: Common carotid artery widely patent to the bifurcation. Minimal calcified plaque at the carotid bifurcation. No stenosis. Cervical ICA widely patent. Vertebral arteries: Vertebral artery origins are widely patent. Vertebral arteries appear normal through the cervical region to the foramen magnum, though detail is somewhat limited due to extensive venous collateral flow adjacent to the vertebral arteries. Skeleton: Ordinary cervical spondylosis and facet osteoarthritis. Other neck: No mass or lymphadenopathy. Previous left thyroidectomy. Multiple nodules within the right lobe of the thyroid, the largest measuring 1.5 cm in diameter. Upper chest: Minimal emphysema in the upper lobes. Apparent occlusion of the innominate vein, possibly due to extrinsic compression between the arterial structures and right clavicular head. This is responsible for the pattern of venous drainage described above. Review of the MIP images confirms the above findings CTA HEAD FINDINGS Anterior circulation: Both internal carotid arteries are patent through the skull base and siphon regions. Ordinary mild siphon atherosclerotic calcification without stenosis. The anterior and middle cerebral vessels are patent. No large or medium vessel occlusion. No proximal stenosis. No aneurysm or vascular malformation. Fetal origin of the right PCA. Posterior circulation: Both vertebral arteries are patent through the foramen magnum to the basilar. No basilar stenosis. Posterior  circulation branch vessels are normal. Venous sinuses: Patent and normal. Anatomic variants: None other significant. Review of the MIP images confirms the above findings IMPRESSION: 1. Aortic atherosclerosis and ectasia. 2. Mild atherosclerotic change at both carotid bifurcations but without stenosis. 3. No intracranial large or medium vessel occlusion or correctable proximal stenosis. 4. Apparent occlusion of the innominate vein, possibly due to extrinsic compression between the arterial structures and right clavicular head. This is responsible for the pattern of venous collateral drainage described in the body of the report. 5. Previous left thyroidectomy. Multiple nodules within the right lobe of the thyroid, the largest measuring 1.4 cm in diameter. Recommend thyroid US (ref: J Am Coll Radiol. 2015 Feb;12(2): 143-50). Aortic Atherosclerosis (ICD10-I70.0) and Emphysema (ICD10-J43.9). Electronically Signed   By: Paulina Fusi M.D.   On: 10/01/2020 15:03    ***  Jacquelynn Cree, PA-C 10/02/2020

## 2020-10-02 NOTE — Evaluation (Signed)
Occupational Therapy Evaluation Patient Details Name: Leslie [name] MRN: [id number] DOB: [birth year] Today's Date: 10/02/2020    History of Present Illness Patient is a 87 y.o. 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   Clinical Impression   PTA patient independent and driving, limited IADLs.  Admitted for above and limited by problem list below, including mild L UE dysmetria and decreased distal sensation, impaired balance, generalized weakness, and impaired cognition. Patient able to self correct orientation to month, but unable to state day of the week; she requires cueing for problem solving and decreased recall.  She is able to complete ADLs with up to min assist, min guard for in room mobility and transfers.  She will benefit from further OT services acutely and after dc at CIR level to optimize independence and safety with ADLs, mobility in order return to PLOF.  Recommend further functional cognitive assessment as well.  IF patient goes home, she will need 24/7 support at this time and [facility] services.    Follow Up Recommendations  CIR;Supervision/Assistance - 24 hour    Equipment Recommendations  None recommended by OT    Recommendations for Other Services       Precautions / Restrictions Precautions Precautions: Fall Restrictions Weight Bearing Restrictions: No      Mobility Bed Mobility               General bed mobility comments: OOB upon entry    Transfers Overall transfer level: Needs assistance   Transfers: Sit to/from Stand Sit to Stand: Min guard         General transfer comment: min guard to steady upon standing    Balance Overall balance assessment: Needs  assistance Sitting-balance support: No upper extremity supported;Feet supported Sitting balance-Leahy Scale: Good     Standing balance support: No upper extremity supported;During functional activity Standing balance-Leahy Scale: Fair Standing balance comment: min guard for safety/balance                           ADL either performed or assessed with clinical judgement   ADL Overall ADL's : Needs assistance/impaired     Grooming: Min guard;Standing   Upper Body Bathing: Set up;Sitting   Lower Body Bathing: Min guard;Sit to/from stand   Upper Body Dressing : Set up;Sitting   Lower Body Dressing: Minimal assistance;Sit to/from stand   Toilet Transfer: Min guard;Ambulation;Grab bars;Regular Toilet   Toileting- Clothing Manipulation and Hygiene: Minimal assistance;Sit to/from stand Toileting - Clothing Manipulation Details (indicate cue type and reason): clothing mgmt     Functional mobility during ADLs: Min guard General ADL Comments: patient limited by generalized weakness, balance     Vision Baseline Vision/History: Wears glasses Wears Glasses: At all times Patient Visual Report: No change from baseline Vision Assessment?: No apparent visual deficits Additional Comments: mild periphreal vision deficit bilaterally     Perception     Praxis      Pertinent Vitals/Pain Pain Assessment: No/denies pain     Hand Dominance Right   Extremity/Trunk Assessment Upper Extremity Assessment Upper Extremity Assessment: LUE deficits/detail;Generalized weakness LUE Deficits / Details: mild dysmetria (fine and gross motor), decreased sesnsation distally LUE Sensation: decreased light touch LUE Coordination: decreased fine motor;decreased gross  motor   Lower Extremity Assessment Lower Extremity Assessment: Defer to PT evaluation       Communication Communication Communication: Expressive difficulties (slurred speech)   Cognition Arousal/Alertness:  Awake/alert Behavior During Therapy: WFL for tasks assessed/performed Overall Cognitive Status: Impaired/Different from baseline Area of Impairment: Memory;Problem solving                 Orientation Level:  (self corrects month, unable to state day of week; did not ask about place)   Memory: Decreased short-term memory       Problem Solving: Slow processing;Requires verbal cues General Comments: some slow processing and decreased problem sovling, decreased memory but able to self correct month (originally voicing january)   General Comments  son present and supportive, pt will have 24/7 support at home if needed    Exercises     Shoulder Instructions      Home Living Family/patient expects to be discharged to:: Private residence Living Arrangements: Alone Available Help at Discharge: Family;Neighbor;Friend(s);Available PRN/intermittently Type of Home: House Home Access: Stairs to enter [employer] of Steps: 2 Entrance Stairs-Rails: Right Home Layout: Two level;Able to live on main level with bedroom/bathroom Alternate Level Stairs-Number of Steps: flight   Bathroom Shower/Tub: Walk-in shower;Tub/shower unit   Bathroom Toilet: Handicapped height     Home Equipment: Emergency planning/management officer - 2 wheels;Grab bars - toilet   Additional Comments: reports usually takes baths in the tub      Prior Functioning/Environment Level of Independence: Independent        Comments: Neighbors bring food in a lot for her, and she warms it up in the microwave. Goes out to lunch almost daily. Drives.        OT Problem List: Decreased strength;Impaired balance (sitting and/or standing);Decreased activity tolerance;Decreased coordination;Decreased cognition;Decreased safety awareness      OT Treatment/Interventions: Self-care/ADL training;DME and/or AE instruction;Therapeutic activities;Cognitive remediation/compensation;Balance training;Patient/family  education;Neuromuscular education    OT Goals(Current goals can be found in the care plan section) Acute Rehab OT Goals Patient Stated Goal: to get home OT Goal Formulation: With patient Time For Goal Achievement: 10/16/20 Potential to Achieve Goals: Good  OT Frequency: Min 2X/week   Barriers to D/C:            Co-evaluation              AM-PAC OT "6 Clicks" Daily Activity     Outcome Measure Help from another person eating meals?: None Help from another person taking care of personal grooming?: A Little Help from another person toileting, which includes using toliet, bedpan, or urinal?: A Little Help from another person bathing (including washing, rinsing, drying)?: A Little Help from another person to put on and taking off regular upper body clothing?: A Little Help from another person to put on and taking off regular lower body clothing?: A Little 6 Click Score: 19   End of Session Equipment Utilized During Treatment: Gait belt Nurse Communication: Mobility status  Activity Tolerance: Patient tolerated treatment well Patient left: in chair;with call bell/phone within reach;with chair alarm set;with nursing/sitter in room;with family/visitor present  OT Visit Diagnosis: Other abnormalities of gait and mobility (R26.89);Muscle weakness (generalized) (M62.81)                Time: 5053-9767 OT Time Calculation (min): 19 min Charges:  OT General Charges $OT Visit: 1 Visit OT Evaluation $OT Eval Moderate Complexity: 1 Mod  Barry Brunner, OT Acute Rehabilitation Services Pager [phone] Office [phone]   Chancy Milroy  10/02/2020, 12:50 PM

## 2020-10-02 NOTE — Progress Notes (Addendum)
PROGRESS NOTE  Leslie [name] XMI:[id number] DOB: January 14, 1927 DOA: 10/01/2020 PCP: Lucky Cowboy, MD   LOS: 1 day   Brief Narrative / Interim history: 87 year old female, independent at baseline, history of hypertension, hyperlipidemia, prediabetes, vitamin D deficiency she comes into the hospital from her church after having slurred speech and left-sided facial droop.  At baseline she lives alone, able to complete her ADLs, walks independently without a cane or a walker.  Other than the slurred speech and the facial droop she has no other focal deficits.  MRI on admission showed small acute infarctions at the right frontoparietal vertex in the left lateral cerebellum, suggesting embolic disease  Subjective / 24h Interval events: She is doing well this morning, eating her breakfast.  She denies and does not appreciate any slurring of her speech today.  Assessment & Plan: Principal Problem Acute CVA-neurology consulted, appreciate input.  Discussed with Dr. Roda Shutters.  Underwent work-up for stroke, A1c 5.8, LDL 78.  CT angiogram without large or medium vessel occlusion or correctable proximal stenosis.  OT recommending CIR, consult pending.  Currently on dual antiplatelet therapy with aspirin and Plavix.  She is on statin.  Active Problems Systolic CHF, unknown chronicity -a 2D echo done as part of the stroke work-up showed severely depressed LVEF with akinesis of the septum, inferior and apical walls, hypokinesis elsewhere.  LVEF is 25%.  RV is normal.  Patient tells me that she was sent to her cardiologist about 15 years ago from her PCP due to "heart failure", but the cardiologist said she does not have it.  She has a left bundle branch block which is old.  Not sure her candidacy for aggressive invasive work-up, I have consulted cardiology to assist with further evaluation and medical management.  For now cannot start Entresto or other medications since we allow permissive hypertension  Thyroid  nodules-we will need to be followed up as an outpatient.  She has a history of left thyroidectomy  Prediabetes-dietary measures  Scheduled Meds: . aspirin EC  81 mg Oral Daily  . atorvastatin  20 mg Oral Daily  . clopidogrel  75 mg Oral Daily  . enoxaparin (LOVENOX) injection  40 mg Subcutaneous Q24H  . latanoprost  1 drop Both Eyes QHS   Continuous Infusions: PRN Meds:.acetaminophen **OR** acetaminophen (TYLENOL) oral liquid 160 mg/5 mL **OR** acetaminophen, hydrALAZINE, LORazepam, senna-docusate  Diet Orders (From admission, onward)    Start     Ordered   10/01/20 1814  Diet Heart Room service appropriate? Yes; Fluid consistency: Thin  Diet effective now       Question Answer Comment  Room service appropriate? Yes   Fluid consistency: Thin      10/01/20 1813          DVT prophylaxis: enoxaparin (LOVENOX) injection 40 mg Start: 10/01/20 1800     Code Status: DNR  Family Communication: Son present at bedside  Status is: Inpatient  Remains inpatient appropriate because:Inpatient level of care appropriate due to severity of illness   Dispo: The patient is from: Home              Anticipated d/c is to: CIR              Patient currently is not medically stable to d/c.   Difficult to place patient No  Level of care: Telemetry Medical  Consultants:  Neurology Cardiology  Procedures:  2D echo  Microbiology  none  Antimicrobials: none    Objective: Vitals:   10/02/20  1610 10/02/20 0453 10/02/20 0700 10/02/20 1100  BP: 104/61 107/71 123/75 131/84  Pulse: 71 86 77 89  Resp: Temp: 98.1 F (36.7 C) 98.4 F (36.9 C) 98.2 F (36.8 C) 98.2 F (36.8 C)  TempSrc: Oral Oral Oral Oral  SpO2: 99% 97% 95% 95%  Weight:      Height:        Intake/Output Summary (Last 24 hours) at 10/02/2020 1402 Last data filed at 10/01/2020 1832 Gross per 24 hour  Intake 150 ml  Output --  Net 150 ml   Filed Weights   10/01/20 1323 10/01/20 2125  Weight: 48.3  kg 45.9 kg    Examination:  Constitutional: NAD Eyes: no scleral icterus ENMT: Mucous membranes are moist.  Neck: normal, supple Respiratory: clear to auscultation bilaterally, no wheezing, no crackles. Normal respiratory effort. Cardiovascular: Regular rate and rhythm. No LE edema. Good peripheral pulses Abdomen: non distended, no tenderness. Bowel sounds positive.  Musculoskeletal: no clubbing / cyanosis.  Skin: no rashes Neurologic:  Strength 5/5 in all 4.  Facial droop noted Psychiatric: Normal judgment and insight. Alert and oriented x 3. Normal mood.    Data Reviewed: I have independently reviewed following labs and imaging studies   CBC: Recent Labs  Lab 10/01/20 1313 10/01/20 1320  WBC 8.9  --   NEUTROABS 7.0  --   HGB 12.3 12.9  HCT 37.2 38.0  MCV 88.2  --   PLT 444*  --    Basic Metabolic Panel: Recent Labs  Lab 10/01/20 1313 10/01/20 1320  NA 132* 138  K 3.8 3.9  CL 100 102  CO2 25  --   GLUCOSE 108* 107*  BUN 19 21  CREATININE 0.88 0.80  CALCIUM 9.5  --    Liver Function Tests: Recent Labs  Lab 10/01/20 1313  AST 17  ALT 15  ALKPHOS 60  BILITOT 1.3*  PROT 6.6  ALBUMIN 3.1*   Coagulation Profile: Recent Labs  Lab 10/01/20 1313  INR 1.1   HbA1C: Recent Labs    10/02/20 0454  HGBA1C 5.8*   CBG: Recent Labs  Lab 10/01/20 1424 10/01/20 1853  GLUCAP 87 105*    Recent Results (from the past 240 hour(s))  Resp Panel by RT-PCR (Flu A&B, Covid) Nasopharyngeal Swab     Status: None   Collection Time: 10/01/20  5:54 PM   Specimen: Nasopharyngeal Swab; Nasopharyngeal(NP) swabs in vial transport medium  Result Value Ref Range Status   SARS Coronavirus 2 by RT PCR NEGATIVE NEGATIVE Final    Comment: (NOTE) SARS-CoV-2 target nucleic acids are NOT DETECTED.  The SARS-CoV-2 RNA is generally detectable in upper respiratory specimens during the acute phase of infection. The lowest concentration of SARS-CoV-2 viral copies this assay can  detect is 138 copies/mL. A negative result does not preclude SARS-Cov-2 infection and should not be used as the sole basis for treatment or other patient management decisions. A negative result may occur with  improper specimen collection/handling, submission of specimen other than nasopharyngeal swab, presence of viral mutation(s) within the areas targeted by this assay, and inadequate number of viral copies(<138 copies/mL). A negative result must be combined with clinical observations, patient history, and epidemiological information. The expected result is Negative.  Fact Sheet for Patients:  BloggerCourse.com  Fact Sheet for Healthcare Providers:  SeriousBroker.it  This test is no t yet approved or cleared by the [place] FDA and  has been authorized for detection and/or diagnosis of  SARS-CoV-2 by FDA under an Emergency Use Authorization (EUA). This EUA will remain  in effect (meaning this test can be used) for the duration of the COVID-19 declaration under Section 564(b)(1) of the Act, 21 U.S.C.section 360bbb-3(b)(1), unless the authorization is terminated  or revoked sooner.       Influenza A by PCR NEGATIVE NEGATIVE Final   Influenza B by PCR NEGATIVE NEGATIVE Final    Comment: (NOTE) The Xpert Xpress SARS-CoV-2/FLU/RSV plus assay is intended as an aid in the diagnosis of influenza from Nasopharyngeal swab specimens and should not be used as a sole basis for treatment. Nasal washings and aspirates are unacceptable for Xpert Xpress SARS-CoV-2/FLU/RSV testing.  Fact Sheet for Patients: BloggerCourse.com  Fact Sheet for Healthcare Providers: SeriousBroker.it  This test is not yet approved or cleared by the [place] FDA and has been authorized for detection and/or diagnosis of SARS-CoV-2 by FDA under an Emergency Use Authorization (EUA). This EUA will remain in  effect (meaning this test can be used) for the duration of the COVID-19 declaration under Section 564(b)(1) of the Act, 21 U.S.C. section 360bbb-3(b)(1), unless the authorization is terminated or revoked.  Performed at [facility], [address], [place], [place] [address]      Radiology Studies: MR BRAIN WO CONTRAST  Result Date: 10/01/2020 CLINICAL DATA:  Slurred speech and left face droop beginning today. EXAM: MRI HEAD WITHOUT CONTRAST TECHNIQUE: Multiplanar, multiecho pulse sequences of the brain and surrounding structures were obtained without intravenous contrast. COMPARISON:  CT studies same day. FINDINGS: Brain: Small acute infarction in the left lateral cerebellum. Several punctate acute infarctions at the right frontoparietal vertex affecting the cortex and subcortical white matter. No confluent large vessel infarction. Generalized age related atrophy. Chronic small-vessel ischemic changes of the pons. Numerous old small vessel cerebellar infarctions. Old small vessel infarctions of the thalami. Moderate chronic small-vessel ischemic changes of the white matter. No hydrocephalus, hemorrhage or extra-axial collection. Vascular: Major vessels at the base of the brain show flow. Skull and upper cervical spine: Negative Sinuses/Orbits: Sinuses are clear.  Orbits are negative. Other: Small right mastoid effusion. IMPRESSION: 1. Several small/punctate acute infarctions at the right frontoparietal vertex. 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 This distribution would suggest embolic disease from the heart or ascending aorta. 2. Numerous old small vessel ischemic changes throughout the brain as outlined above. Electronically Signed   By: Paulina Fusi M.D.   On: 10/01/2020 17:32   DG CHEST PORT 1 VIEW  Result Date: 10/01/2020 CLINICAL DATA:  TIA EXAM: PORTABLE CHEST 1 VIEW COMPARISON:  02/12/2012 FINDINGS: Cardiomegaly. No confluent opacities or effusions. No acute bony  abnormality. IMPRESSION: Cardiomegaly.  No active disease. Electronically Signed   By: Charlett Nose M.D.   On: 10/01/2020 17:54   ECHOCARDIOGRAM COMPLETE  Result Date: 10/02/2020    ECHOCARDIOGRAM REPORT   Patient Name:   [name] Date of Exam: 10/02/2020 Medical Rec #:  [id number]          Height:       62.0 in Accession #:    [id number]         Weight:       101.2 lb Date of Birth:  October 18, 1926           BSA:          1.431 m Patient Age:    89 years           BP:  123/75 mmHg Patient Gender: F                  HR:           78 bpm. Exam Location:  Inpatient Procedure: Color Doppler and Cardiac Doppler                            MODIFIED REPORT: This report was modified by Dietrich Pates MD on 10/02/2020 due to Complete.  Indications:     Stroke  History:         Patient has no prior history of Echocardiogram examinations.  Sonographer:     Roosvelt Maser RDCS Referring Phys:  [id number] [facility] Diagnosing Phys: Dietrich Pates MD IMPRESSIONS  1. LVEF is severely depressed with akinesis of the septum, inferior and apical walls; hypokinesis elsewhere. Left ventricular ejection fraction, by estimation, is 25%. The left ventricular internal cavity size was moderately dilated. Indeterminate diastolic filling due to E-A fusion.  2. Right ventricular systolic function is normal. The right ventricular size is normal.  3. Left atrial size was mildly dilated.  4. The mitral valve is abnormal. Mild mitral valve regurgitation.  5. The aortic valve is normal in structure. Aortic valve regurgitation is trivial.  6. The inferior vena cava is normal in size with greater than 50% respiratory variability, suggesting right atrial pressure of 3 mmHg. FINDINGS  Left Ventricle: LVEF is severely depressed with akinesis of the septum, inferior and apical walls; hypokinesis elsewhere. Left ventricular ejection fraction, by estimation, is 25%%. The left ventricle demonstrates regional wall motion abnormalities. 3D left  ventricular ejection fraction analysis performed but not reported based on interpreter judgement due to suboptimal quality. The left ventricular internal cavity size was moderately dilated. There is no left ventricular hypertrophy. Indeterminate diastolic filling due to E-A fusion. Right Ventricle: The right ventricular size is normal. No increase in right ventricular wall thickness. Right ventricular systolic function is normal. Left Atrium: Left atrial size was mildly dilated. Right Atrium: Right atrial size was normal in size. Pericardium: Trivial pericardial effusion is present. Mitral Valve: The mitral valve is abnormal. There is mild thickening of the mitral valve leaflet(s). Mild mitral annular calcification. Mild mitral valve regurgitation. Tricuspid Valve: The tricuspid valve is normal in structure. Tricuspid valve regurgitation is mild. Aortic Valve: The aortic valve is normal in structure. Aortic valve regurgitation is trivial. Aortic valve mean gradient measures 6.0 mmHg. Aortic valve peak gradient measures 11.3 mmHg. Aortic valve area, by VTI measures 1.43 cm. Pulmonic Valve: The pulmonic valve was normal in structure. Pulmonic valve regurgitation is trivial. Aorta: The aortic root is normal in size and structure. Venous: The inferior vena cava is normal in size with greater than 50% respiratory variability, suggesting right atrial pressure of 3 mmHg. IAS/Shunts: The interatrial septum was not assessed.  LEFT VENTRICLE PLAX 2D LVIDd:         5.60 cm LVIDs:         4.90 cm LV PW:         0.90 cm LV IVS:        0.90 cm LVOT diam:     1.80 cm  3D Volume EF: LV SV:         41       3D EF:        43 % LV SV Index:   28       LV EDV:  168 ml LVOT Area:     2.54 cm LV ESV:       96 ml                         LV SV:        72 ml RIGHT VENTRICLE          IVC RV Basal diam:  3.60 cm  IVC diam: 1.30 cm LEFT ATRIUM             Index       RIGHT ATRIUM           Index LA diam:        3.60 cm 2.52 cm/m  RA  Area:     12.50 cm LA Vol (A2C):   70.1 ml 48.98 ml/m RA Volume:   27.50 ml  19.22 ml/m LA Vol (A4C):   47.7 ml 33.33 ml/m LA Biplane Vol: 57.9 ml 40.46 ml/m  AORTIC VALVE AV Area (Vmax):    1.43 cm AV Area (Vmean):   1.28 cm AV Area (VTI):     1.43 cm AV Vmax:           168.00 cm/s AV Vmean:          112.000 cm/s AV VTI:            0.285 m AV Peak Grad:      11.3 mmHg AV Mean Grad:      6.0 mmHg LVOT Vmax:         94.60 cm/s LVOT Vmean:        56.400 cm/s LVOT VTI:          0.160 m LVOT/AV VTI ratio: 0.56  AORTA Ao Root diam: 2.90 cm Ao Asc diam:  3.20 cm  SHUNTS Systemic VTI:  0.16 m Systemic Diam: 1.80 cm Dietrich Pates MD Electronically signed by Dietrich Pates MD Signature Date/Time: 10/02/2020/12:23:41 PM    Final (Updated)    VAS [place] LOWER EXTREMITY VENOUS (DVT)  Result Date: 10/02/2020  Lower Venous DVT Study Indications: Stroke.  Comparison Study: no prior Performing Technologist: Blanch Media RVS  Examination Guidelines: A complete evaluation includes B-mode imaging, spectral Doppler, color Doppler, and power Doppler as needed of all accessible portions of each vessel. Bilateral testing is considered an integral part of a complete examination. Limited examinations for reoccurring indications may be performed as noted. The reflux portion of the exam is performed with the patient in reverse Trendelenburg.  +---------+---------------+---------+-----------+----------+--------------+ RIGHT    CompressibilityPhasicitySpontaneityPropertiesThrombus Aging +---------+---------------+---------+-----------+----------+--------------+ CFV      Full           Yes      Yes                                 +---------+---------------+---------+-----------+----------+--------------+ SFJ      Full                                                        +---------+---------------+---------+-----------+----------+--------------+ FV Prox  Full                                                         +---------+---------------+---------+-----------+----------+--------------+  FV Mid   Full                                                        +---------+---------------+---------+-----------+----------+--------------+ FV DistalFull                                                        +---------+---------------+---------+-----------+----------+--------------+ PFV      Full                                                        +---------+---------------+---------+-----------+----------+--------------+ POP      Full           Yes      Yes                                 +---------+---------------+---------+-----------+----------+--------------+ PTV      Full                                                        +---------+---------------+---------+-----------+----------+--------------+ PERO     Full                                                        +---------+---------------+---------+-----------+----------+--------------+   +---------+---------------+---------+-----------+----------+--------------+ LEFT     CompressibilityPhasicitySpontaneityPropertiesThrombus Aging +---------+---------------+---------+-----------+----------+--------------+ CFV      Full           Yes      Yes                                 +---------+---------------+---------+-----------+----------+--------------+ SFJ      Full                                                        +---------+---------------+---------+-----------+----------+--------------+ FV Prox  Full                                                        +---------+---------------+---------+-----------+----------+--------------+ FV Mid   Full                                                        +---------+---------------+---------+-----------+----------+--------------+  FV DistalFull                                                         +---------+---------------+---------+-----------+----------+--------------+ PFV      Full                                                        +---------+---------------+---------+-----------+----------+--------------+ POP      Full           Yes      Yes                                 +---------+---------------+---------+-----------+----------+--------------+ PTV      Full                                                        +---------+---------------+---------+-----------+----------+--------------+ PERO     Full                                                        +---------+---------------+---------+-----------+----------+--------------+     Summary: BILATERAL: - No evidence of deep vein thrombosis seen in the lower extremities, bilaterally. - No evidence of superficial venous thrombosis in the lower extremities, bilaterally. -No evidence of popliteal cyst, bilaterally.   *See table(s) above for measurements and observations. Electronically signed by Lemar Livings MD on 10/02/2020 at 11:20:48 AM.    Final    CT ANGIO HEAD CODE STROKE  Result Date: 10/01/2020 CLINICAL DATA:  Slurred speech and facial droop. EXAM: CT ANGIOGRAPHY HEAD AND NECK TECHNIQUE: Multidetector CT imaging of the head and neck was performed using the standard protocol during bolus administration of intravenous contrast. Multiplanar CT image reconstructions and MIPs were obtained to evaluate the vascular anatomy. Carotid stenosis measurements (when applicable) are obtained utilizing NASCET criteria, using the distal internal carotid diameter as the denominator. CONTRAST:  56mL OMNIPAQUE IOHEXOL 350 MG/ML SOLN COMPARISON:  Head CT same day FINDINGS: CTA NECK FINDINGS Aortic arch: Aortic atherosclerosis and ectasia. Branching pattern is normal without origin stenosis. Right carotid system: Common carotid artery widely patent to the carotid bifurcation. Calcified plaque at the bifurcation and ICA bulb but no  stenosis. Cervical ICA widely patent beyond that. Left carotid system: Common carotid artery widely patent to the bifurcation. Minimal calcified plaque at the carotid bifurcation. No stenosis. Cervical ICA widely patent. Vertebral arteries: Vertebral artery origins are widely patent. Vertebral arteries appear normal through the cervical region to the foramen magnum, though detail is somewhat limited due to extensive venous collateral flow adjacent to the vertebral arteries. Skeleton: Ordinary cervical spondylosis and facet osteoarthritis. Other neck: No mass or lymphadenopathy. Previous left thyroidectomy. Multiple nodules within the right lobe of the thyroid, the largest measuring 1.5 cm in diameter. Upper chest: Minimal  emphysema in the upper lobes. Apparent occlusion of the innominate vein, possibly due to extrinsic compression between the arterial structures and right clavicular head. This is responsible for the pattern of venous drainage described above. Review of the MIP images confirms the above findings CTA HEAD FINDINGS Anterior circulation: Both internal carotid arteries are patent through the skull base and siphon regions. Ordinary mild siphon atherosclerotic calcification without stenosis. The anterior and middle cerebral vessels are patent. No large or medium vessel occlusion. No proximal stenosis. No aneurysm or vascular malformation. Fetal origin of the right PCA. Posterior circulation: Both vertebral arteries are patent through the foramen magnum to the basilar. No basilar stenosis. Posterior circulation branch vessels are normal. Venous sinuses: Patent and normal. Anatomic variants: None other significant. Review of the MIP images confirms the above findings IMPRESSION: 1. Aortic atherosclerosis and ectasia. 2. Mild atherosclerotic change at both carotid bifurcations but without stenosis. 3. No intracranial large or medium vessel occlusion or correctable proximal stenosis. 4. Apparent occlusion of  the innominate vein, possibly due to extrinsic compression between the arterial structures and right clavicular head. This is responsible for the pattern of venous collateral drainage described in the body of the report. 5. Previous left thyroidectomy. Multiple nodules within the right lobe of the thyroid, the largest measuring 1.4 cm in diameter. Recommend thyroid US (ref: J Am Coll Radiol. 2015 Feb;12(2): 143-50). Aortic Atherosclerosis (ICD10-I70.0) and Emphysema (ICD10-J43.9). Electronically Signed   By: Paulina Fusi M.D.   On: 10/01/2020 15:03   CT ANGIO NECK CODE STROKE  Result Date: 10/01/2020 CLINICAL DATA:  Slurred speech and facial droop. EXAM: CT ANGIOGRAPHY HEAD AND NECK TECHNIQUE: Multidetector CT imaging of the head and neck was performed using the standard protocol during bolus administration of intravenous contrast. Multiplanar CT image reconstructions and MIPs were obtained to evaluate the vascular anatomy. Carotid stenosis measurements (when applicable) are obtained utilizing NASCET criteria, using the distal internal carotid diameter as the denominator. CONTRAST:  75mL OMNIPAQUE IOHEXOL 350 MG/ML SOLN COMPARISON:  Head CT same day FINDINGS: CTA NECK FINDINGS Aortic arch: Aortic atherosclerosis and ectasia. Branching pattern is normal without origin stenosis. Right carotid system: Common carotid artery widely patent to the carotid bifurcation. Calcified plaque at the bifurcation and ICA bulb but no stenosis. Cervical ICA widely patent beyond that. Left carotid system: Common carotid artery widely patent to the bifurcation. Minimal calcified plaque at the carotid bifurcation. No stenosis. Cervical ICA widely patent. Vertebral arteries: Vertebral artery origins are widely patent. Vertebral arteries appear normal through the cervical region to the foramen magnum, though detail is somewhat limited due to extensive venous collateral flow adjacent to the vertebral arteries. Skeleton: Ordinary  cervical spondylosis and facet osteoarthritis. Other neck: No mass or lymphadenopathy. Previous left thyroidectomy. Multiple nodules within the right lobe of the thyroid, the largest measuring 1.5 cm in diameter. Upper chest: Minimal emphysema in the upper lobes. Apparent occlusion of the innominate vein, possibly due to extrinsic compression between the arterial structures and right clavicular head. This is responsible for the pattern of venous drainage described above. Review of the MIP images confirms the above findings CTA HEAD FINDINGS Anterior circulation: Both internal carotid arteries are patent through the skull base and siphon regions. Ordinary mild siphon atherosclerotic calcification without stenosis. The anterior and middle cerebral vessels are patent. No large or medium vessel occlusion. No proximal stenosis. No aneurysm or vascular malformation. Fetal origin of the right PCA. Posterior circulation: Both vertebral arteries are patent through the foramen magnum to  the basilar. No basilar stenosis. Posterior circulation branch vessels are normal. Venous sinuses: Patent and normal. Anatomic variants: None other significant. Review of the MIP images confirms the above findings IMPRESSION: 1. Aortic atherosclerosis and ectasia. 2. Mild atherosclerotic change at both carotid bifurcations but without stenosis. 3. No intracranial large or medium vessel occlusion or correctable proximal stenosis. 4. Apparent occlusion of the innominate vein, possibly due to extrinsic compression between the arterial structures and right clavicular head. This is responsible for the pattern of venous collateral drainage described in the body of the report. 5. Previous left thyroidectomy. Multiple nodules within the right lobe of the thyroid, the largest measuring 1.4 cm in diameter. Recommend thyroid US (ref: J Am Coll Radiol. 2015 Feb;12(2): 143-50). Aortic Atherosclerosis (ICD10-I70.0) and Emphysema (ICD10-J43.9).  Electronically Signed   By: Paulina FusiMark  Shogry M.D.   On: 10/01/2020 15:03    Pamella Pertostin Shaelee Forni, MD, PhD [facility]  Between 7 am - 7 pm I am available, please contact me via Amion or Securechat  Between 7 pm - 7 am I am not available, please contact night coverage MD/APP via Amion

## 2020-10-03 ENCOUNTER — Encounter (HOSPITAL_COMMUNITY): Payer: Self-pay | Admitting: Physical Medicine & Rehabilitation

## 2020-10-03 ENCOUNTER — Inpatient Hospital Stay (HOSPITAL_COMMUNITY)
Admission: RE | Admit: 2020-10-03 | Discharge: 2020-10-10 | DRG: 057 | Disposition: A | Discharge status: Home / Self Care | Payer: Medicare Other | Source: Intra-hospital | Attending: Physical Medicine & Rehabilitation | Admitting: Physical Medicine & Rehabilitation

## 2020-10-03 ENCOUNTER — Encounter (HOSPITAL_COMMUNITY): Payer: Self-pay | Admitting: Internal Medicine

## 2020-10-03 ENCOUNTER — Other Ambulatory Visit: Payer: Self-pay

## 2020-10-03 ENCOUNTER — Other Ambulatory Visit: Payer: Self-pay | Admitting: *Deleted

## 2020-10-03 DIAGNOSIS — I6389 Other cerebral infarction: Secondary | ICD-10-CM

## 2020-10-03 DIAGNOSIS — M542 Cervicalgia: Secondary | ICD-10-CM | POA: Diagnosis present

## 2020-10-03 DIAGNOSIS — I10 Essential (primary) hypertension: Secondary | ICD-10-CM

## 2020-10-03 DIAGNOSIS — R7303 Prediabetes: Secondary | ICD-10-CM | POA: Diagnosis present

## 2020-10-03 DIAGNOSIS — M545 Low back pain, unspecified: Secondary | ICD-10-CM | POA: Diagnosis present

## 2020-10-03 DIAGNOSIS — I69354 Hemiplegia and hemiparesis following cerebral infarction affecting left non-dominant side: Secondary | ICD-10-CM | POA: Diagnosis not present

## 2020-10-03 DIAGNOSIS — G8929 Other chronic pain: Secondary | ICD-10-CM | POA: Diagnosis present

## 2020-10-03 DIAGNOSIS — K5901 Slow transit constipation: Secondary | ICD-10-CM

## 2020-10-03 DIAGNOSIS — I472 Ventricular tachycardia: Secondary | ICD-10-CM | POA: Diagnosis present

## 2020-10-03 DIAGNOSIS — I6310 Cerebral infarction due to embolism of unspecified precerebral artery: Secondary | ICD-10-CM | POA: Diagnosis not present

## 2020-10-03 DIAGNOSIS — Z833 Family history of diabetes mellitus: Secondary | ICD-10-CM

## 2020-10-03 DIAGNOSIS — F419 Anxiety disorder, unspecified: Secondary | ICD-10-CM | POA: Diagnosis present

## 2020-10-03 DIAGNOSIS — D62 Acute posthemorrhagic anemia: Secondary | ICD-10-CM

## 2020-10-03 DIAGNOSIS — I6930 Unspecified sequelae of cerebral infarction: Secondary | ICD-10-CM | POA: Diagnosis not present

## 2020-10-03 DIAGNOSIS — E785 Hyperlipidemia, unspecified: Secondary | ICD-10-CM | POA: Diagnosis present

## 2020-10-03 DIAGNOSIS — I5042 Chronic combined systolic (congestive) and diastolic (congestive) heart failure: Secondary | ICD-10-CM

## 2020-10-03 DIAGNOSIS — A499 Bacterial infection, unspecified: Secondary | ICD-10-CM

## 2020-10-03 DIAGNOSIS — F411 Generalized anxiety disorder: Secondary | ICD-10-CM | POA: Diagnosis not present

## 2020-10-03 DIAGNOSIS — R7301 Impaired fasting glucose: Secondary | ICD-10-CM | POA: Diagnosis present

## 2020-10-03 DIAGNOSIS — M549 Dorsalgia, unspecified: Secondary | ICD-10-CM | POA: Diagnosis present

## 2020-10-03 DIAGNOSIS — E440 Moderate protein-calorie malnutrition: Secondary | ICD-10-CM | POA: Diagnosis present

## 2020-10-03 DIAGNOSIS — N390 Urinary tract infection, site not specified: Secondary | ICD-10-CM | POA: Diagnosis present

## 2020-10-03 DIAGNOSIS — E46 Unspecified protein-calorie malnutrition: Secondary | ICD-10-CM | POA: Diagnosis not present

## 2020-10-03 DIAGNOSIS — I639 Cerebral infarction, unspecified: Secondary | ICD-10-CM | POA: Diagnosis present

## 2020-10-03 DIAGNOSIS — Z823 Family history of stroke: Secondary | ICD-10-CM

## 2020-10-03 DIAGNOSIS — Z8249 Family history of ischemic heart disease and other diseases of the circulatory system: Secondary | ICD-10-CM

## 2020-10-03 DIAGNOSIS — I110 Hypertensive heart disease with heart failure: Secondary | ICD-10-CM | POA: Diagnosis present

## 2020-10-03 DIAGNOSIS — Z681 Body mass index (BMI) 19 or less, adult: Secondary | ICD-10-CM

## 2020-10-03 DIAGNOSIS — Z79899 Other long term (current) drug therapy: Secondary | ICD-10-CM

## 2020-10-03 DIAGNOSIS — B9689 Other specified bacterial agents as the cause of diseases classified elsewhere: Secondary | ICD-10-CM | POA: Diagnosis present

## 2020-10-03 DIAGNOSIS — Z87891 Personal history of nicotine dependence: Secondary | ICD-10-CM | POA: Diagnosis not present

## 2020-10-03 DIAGNOSIS — H9190 Unspecified hearing loss, unspecified ear: Secondary | ICD-10-CM | POA: Diagnosis present

## 2020-10-03 DIAGNOSIS — E8809 Other disorders of plasma-protein metabolism, not elsewhere classified: Secondary | ICD-10-CM | POA: Diagnosis present

## 2020-10-03 DIAGNOSIS — Z888 Allergy status to other drugs, medicaments and biological substances status: Secondary | ICD-10-CM

## 2020-10-03 DIAGNOSIS — I429 Cardiomyopathy, unspecified: Secondary | ICD-10-CM | POA: Diagnosis present

## 2020-10-03 DIAGNOSIS — E559 Vitamin D deficiency, unspecified: Secondary | ICD-10-CM | POA: Diagnosis present

## 2020-10-03 LAB — CBC
HCT: 34 % — ABNORMAL LOW (ref 36.0–46.0)
Hemoglobin: 11.3 g/dL — ABNORMAL LOW (ref 12.0–15.0)
MCH: 28.9 pg (ref 26.0–34.0)
MCHC: 33.2 g/dL (ref 30.0–36.0)
MCV: 87 fL (ref 80.0–100.0)
Platelets: 409 10*3/uL — ABNORMAL HIGH (ref 150–400)
RBC: 3.91 MIL/uL (ref 3.87–5.11)
RDW: 15.3 % (ref 11.5–15.5)
WBC: 5.3 10*3/uL (ref 4.0–10.5)
nRBC: 0 % (ref 0.0–0.2)

## 2020-10-03 LAB — BASIC METABOLIC PANEL
Anion gap: 6 (ref 5–15)
BUN: 17 mg/dL (ref 8–23)
CO2: 27 mmol/L (ref 22–32)
Calcium: 9.3 mg/dL (ref 8.9–10.3)
Chloride: 104 mmol/L (ref 98–111)
Creatinine, Ser: 0.84 mg/dL (ref 0.44–1.00)
GFR, Estimated: >60 mL/min (ref >60)
Glucose, Bld: 93 mg/dL (ref 70–99)
Potassium: 3.5 mmol/L (ref 3.5–5.1)
Sodium: 137 mmol/L (ref 135–145)

## 2020-10-03 MED ORDER — MELATONIN 3 MG PO TABS: 3.0000 mg | ORAL_TABLET | Freq: Every evening | ORAL | Status: DC | PRN

## 2020-10-03 MED ORDER — GUAIFENESIN-DM 100-10 MG/5ML PO SYRP: 5.0000 mL | ORAL_SOLUTION | Freq: Four times a day (QID) | ORAL | Status: DC | PRN

## 2020-10-03 MED ORDER — ATORVASTATIN CALCIUM 10 MG PO TABS
20.0000 mg | ORAL_TABLET | Freq: Every day | ORAL | Status: DC
  Administered 2020-10-04 – 2020-10-10 (×7): 20 mg via ORAL
  Filled 2020-10-03 (×7): qty 2

## 2020-10-03 MED ORDER — POLYETHYLENE GLYCOL 3350 17 G PO PACK
17.0000 g | PACK | Freq: Every day | ORAL | Status: DC | PRN
  Administered 2020-10-04: 17 g via ORAL
  Filled 2020-10-03: qty 1

## 2020-10-03 MED ORDER — POTASSIUM CHLORIDE CRYS ER 20 MEQ PO TBCR
40.0000 meq | EXTENDED_RELEASE_TABLET | Freq: Once | ORAL | Status: AC
  Administered 2020-10-03: 40 meq via ORAL
  Filled 2020-10-03: qty 2

## 2020-10-03 MED ORDER — BLOOD PRESSURE CONTROL BOOK
Freq: Once | Status: AC
  Filled 2020-10-03: qty 1

## 2020-10-03 MED ORDER — ATORVASTATIN CALCIUM 20 MG PO TABS: 20.0000 mg | ORAL_TABLET | Freq: Every day | ORAL | Status: DC

## 2020-10-03 MED ORDER — FLEET ENEMA 7-19 GM/118ML RE ENEM: 1.0000 | ENEMA | Freq: Once | RECTAL | Status: DC | PRN

## 2020-10-03 MED ORDER — METOPROLOL SUCCINATE ER 25 MG PO TB24
25.0000 mg | ORAL_TABLET | Freq: Every day | ORAL | Status: DC
  Administered 2020-10-04 – 2020-10-10 (×7): 25 mg via ORAL
  Filled 2020-10-03 (×7): qty 1

## 2020-10-03 MED ORDER — PROCHLORPERAZINE MALEATE 5 MG PO TABS: 5.0000 mg | ORAL_TABLET | Freq: Four times a day (QID) | ORAL | Status: DC | PRN

## 2020-10-03 MED ORDER — APIXABAN 2.5 MG PO TABS
2.5000 mg | ORAL_TABLET | Freq: Two times a day (BID) | ORAL | Status: DC
  Administered 2020-10-04 – 2020-10-10 (×13): 2.5 mg via ORAL
  Filled 2020-10-03 (×13): qty 1

## 2020-10-03 MED ORDER — DIPHENHYDRAMINE HCL 12.5 MG/5ML PO ELIX: 12.5000 mg | ORAL_SOLUTION | Freq: Four times a day (QID) | ORAL | Status: DC | PRN

## 2020-10-03 MED ORDER — METOPROLOL SUCCINATE ER 25 MG PO TB24
25.0000 mg | ORAL_TABLET | Freq: Every day | ORAL | Status: DC
  Administered 2020-10-03: 25 mg via ORAL
  Filled 2020-10-03: qty 1

## 2020-10-03 MED ORDER — ALUM & MAG HYDROXIDE-SIMETH 200-200-20 MG/5ML PO SUSP: 30.0000 mL | ORAL | Status: DC | PRN

## 2020-10-03 MED ORDER — METOPROLOL SUCCINATE ER 25 MG PO TB24: 25.0000 mg | ORAL_TABLET | Freq: Every day | ORAL | Status: DC

## 2020-10-03 MED ORDER — APIXABAN 2.5 MG PO TABS: 2.5000 mg | ORAL_TABLET | Freq: Two times a day (BID) | ORAL | Status: DC

## 2020-10-03 MED ORDER — ENOXAPARIN SODIUM 30 MG/0.3ML ~~LOC~~ SOLN: 30.0000 mg | SUBCUTANEOUS | Status: DC

## 2020-10-03 MED ORDER — BISACODYL 10 MG RE SUPP: 10.0000 mg | Freq: Every day | RECTAL | Status: DC | PRN

## 2020-10-03 MED ORDER — SENNOSIDES-DOCUSATE SODIUM 8.6-50 MG PO TABS: 1.0000 | ORAL_TABLET | Freq: Every evening | ORAL | Status: DC | PRN

## 2020-10-03 MED ORDER — PROCHLORPERAZINE EDISYLATE 10 MG/2ML IJ SOLN: 5.0000 mg | Freq: Four times a day (QID) | INTRAMUSCULAR | Status: DC | PRN

## 2020-10-03 MED ORDER — PROCHLORPERAZINE 25 MG RE SUPP: 12.5000 mg | Freq: Four times a day (QID) | RECTAL | Status: DC | PRN

## 2020-10-03 MED ORDER — LATANOPROST 0.005 % OP SOLN
1.0000 [drp] | Freq: Every day | OPHTHALMIC | Status: DC
  Administered 2020-10-03 – 2020-10-09 (×7): 1 [drp] via OPHTHALMIC

## 2020-10-03 MED ORDER — TRAZODONE HCL 50 MG PO TABS: 25.0000 mg | ORAL_TABLET | Freq: Every evening | ORAL | Status: DC | PRN

## 2020-10-03 MED ORDER — ACETAMINOPHEN 325 MG PO TABS: 325.0000 mg | ORAL_TABLET | ORAL | Status: DC | PRN

## 2020-10-03 NOTE — Progress Notes (Signed)
Telemetry called to report pt having 17 beats of V-tach, pt assessed to to asymptomatic resting in bed, pt denies any or discomfort. MD on-call paged and notified, new orders received. Will continue to closely monitor pt. Dionne Bucy RN

## 2020-10-03 NOTE — Progress Notes (Addendum)
Progress Note  Patient Name: Leslie [name] Date of Encounter: 10/03/2020  [facility] Cardiologist: Donato Schultz, MD   Subjective   Feels well, no complaints  Inpatient Medications    Scheduled Meds:  aspirin EC  81 mg Oral Daily   atorvastatin  20 mg Oral Daily   clopidogrel  75 mg Oral Daily   enoxaparin (LOVENOX) injection  40 mg Subcutaneous Q24H   latanoprost  1 drop Both Eyes QHS   Continuous Infusions:  PRN Meds: acetaminophen **OR** acetaminophen (TYLENOL) oral liquid 160 mg/5 mL **OR** acetaminophen, hydrALAZINE, LORazepam, senna-docusate, traZODone   Vital Signs    Vitals:   10/02/20 2051 10/02/20 2315 10/03/20 0326 10/03/20 0700  BP: 127/80 103/62 134/84 135/83  Pulse: 85 68 78 77  Resp: Temp: 98.3 F (36.8 C) 98.2 F (36.8 C) 97.6 F (36.4 C) 97.6 F (36.4 C)  TempSrc: Oral Oral Oral Oral  SpO2: 97% 98% 99% 97%  Weight:      Height:       No intake or output data in the 24 hours ending 10/03/20 0948 Last 3 Weights 10/01/2020 10/01/2020 08/29/2020  Weight (lbs) 101 lb 3.1 oz 106 lb 7.7 oz 106 lb  Weight (kg) 45.9 kg 48.3 kg 48.081 kg      Telemetry    At 14 beat run of ventricular tachycardia, asymptomatic-I went back and looked at telemetry, I do not see any ventricular tachycardia present.  She does have left bundle branch block present which could be confused for VT perhaps personally Reviewed  ECG    No new- Personally Reviewed  Physical Exam   GEN: No acute distress.   Neck: No JVD Cardiac: RRR, no murmurs, rubs, or gallops.  Respiratory: Clear to auscultation bilaterally. GI: Soft, nontender, non-distended  MS: No edema; No deformity. Neuro:   Mild left-sided facial droop Psych: Normal affect   Labs    High Sensitivity Troponin:  No results for input(s): TROPONINIHS in the last 720 hours.    Chemistry Recent Labs  Lab 10/01/20 1313 10/01/20 1320 10/02/20 1957 10/03/20 0353  NA 132* 138  --  137  K 3.8  3.9 3.5 3.5  CL 100 102  --  104  CO2 25  --   --  27  GLUCOSE 108* 107*  --  93  BUN 19 21  --  17  CREATININE 0.88 0.80  --  0.84  CALCIUM 9.5  --   --  9.3  PROT 6.6  --   --   --   ALBUMIN 3.1*  --   --   --   AST 17  --   --   --   ALT 15  --   --   --   ALKPHOS 60  --   --   --   BILITOT 1.3*  --   --   --   GFRNONAA >60  --   --  >60  ANIONGAP 7  --   --  6     Hematology Recent Labs  Lab 10/01/20 1313 10/01/20 1320 10/03/20 0353  WBC 8.9  --  5.3  RBC 4.22  --  3.91  HGB 12.3 12.9 11.3*  HCT 37.2 38.0 34.0*  MCV 88.2  --  87.0  MCH 29.1  --  28.9  MCHC 33.1  --  33.2  RDW 15.4  --  15.3  PLT 444*  --  409*    BNPNo  results for input(s): BNP, PROBNP in the last 168 hours.   DDimer No results for input(s): DDIMER in the last 168 hours.   Radiology    MR BRAIN WO CONTRAST  Result Date: 10/01/2020 CLINICAL DATA:  Slurred speech and left face droop beginning today. EXAM: MRI HEAD WITHOUT CONTRAST TECHNIQUE: Multiplanar, multiecho pulse sequences of the brain and surrounding structures were obtained without intravenous contrast. COMPARISON:  CT studies same day. FINDINGS: Brain: Small acute infarction in the left lateral cerebellum. Several punctate acute infarctions at the right frontoparietal vertex affecting the cortex and subcortical white matter. No confluent large vessel infarction. Generalized age related atrophy. Chronic small-vessel ischemic changes of the pons. Numerous old small vessel cerebellar infarctions. Old small vessel infarctions of the thalami. Moderate chronic small-vessel ischemic changes of the white matter. No hydrocephalus, hemorrhage or extra-axial collection. Vascular: Major vessels at the base of the brain show flow. Skull and upper cervical spine: Negative Sinuses/Orbits: Sinuses are clear.  Orbits are negative. Other: Small right mastoid effusion. IMPRESSION: 1. Several small/punctate acute infarctions at the right frontoparietal vertex. Small  acute infarction at the left lateral cerebellum. This distribution would suggest embolic disease from the heart or ascending aorta. 2. Numerous old small vessel ischemic changes throughout the brain as outlined above. Electronically Signed   By: Paulina Fusi M.D.   On: 10/01/2020 17:32   DG CHEST PORT 1 VIEW  Result Date: 10/01/2020 CLINICAL DATA:  TIA EXAM: PORTABLE CHEST 1 VIEW COMPARISON:  02/12/2012 FINDINGS: Cardiomegaly. No confluent opacities or effusions. No acute bony abnormality. IMPRESSION: Cardiomegaly.  No active disease. Electronically Signed   By: Charlett Nose M.D.   On: 10/01/2020 17:54   ECHOCARDIOGRAM COMPLETE  Result Date: 10/02/2020    ECHOCARDIOGRAM REPORT   Patient Name:   Leslie [name] Date of Exam: 10/02/2020 Medical Rec #:  [id number]          Height:       62.0 in Accession #:    [id number]         Weight:       101.2 lb Date of Birth:  [birth year]           BSA:          1.431 m Patient Age:    87 years           BP:           123/75 mmHg Patient Gender: F                  HR:           78 bpm. Exam Location:  Inpatient Procedure: Color Doppler and Cardiac Doppler                            MODIFIED REPORT: This report was modified by Dietrich Pates MD on 10/02/2020 due to Complete.  Indications:     Stroke  History:         Patient has no prior history of Echocardiogram examinations.  Sonographer:     Roosvelt Maser RDCS Referring Phys:  [id number] [facility] SMITH Diagnosing Phys: Dietrich Pates MD IMPRESSIONS  1. LVEF is severely depressed with akinesis of the septum, inferior and apical walls; hypokinesis elsewhere. Left ventricular ejection fraction, by estimation, is 25%. The left ventricular internal cavity size was moderately dilated. Indeterminate diastolic filling due to E-A fusion.  2. Right ventricular systolic function is normal. The  right ventricular size is normal.  3. Left atrial size was mildly dilated.  4. The mitral valve is abnormal. Mild mitral valve regurgitation.  5. The  aortic valve is normal in structure. Aortic valve regurgitation is trivial.  6. The inferior vena cava is normal in size with greater than 50% respiratory variability, suggesting right atrial pressure of 3 mmHg. FINDINGS  Left Ventricle: LVEF is severely depressed with akinesis of the septum, inferior and apical walls; hypokinesis elsewhere. Left ventricular ejection fraction, by estimation, is 25%%. The left ventricle demonstrates regional wall motion abnormalities. 3D left ventricular ejection fraction analysis performed but not reported based on interpreter judgement due to suboptimal quality. The left ventricular internal cavity size was moderately dilated. There is no left ventricular hypertrophy. Indeterminate diastolic filling due to E-A fusion. Right Ventricle: The right ventricular size is normal. No increase in right ventricular wall thickness. Right ventricular systolic function is normal. Left Atrium: Left atrial size was mildly dilated. Right Atrium: Right atrial size was normal in size. Pericardium: Trivial pericardial effusion is present. Mitral Valve: The mitral valve is abnormal. There is mild thickening of the mitral valve leaflet(s). Mild mitral annular calcification. Mild mitral valve regurgitation. Tricuspid Valve: The tricuspid valve is normal in structure. Tricuspid valve regurgitation is mild. Aortic Valve: The aortic valve is normal in structure. Aortic valve regurgitation is trivial. Aortic valve mean gradient measures 6.0 mmHg. Aortic valve peak gradient measures 11.3 mmHg. Aortic valve area, by VTI measures 1.43 cm. Pulmonic Valve: The pulmonic valve was normal in structure. Pulmonic valve regurgitation is trivial. Aorta: The aortic root is normal in size and structure. Venous: The inferior vena cava is normal in size with greater than 50% respiratory variability, suggesting right atrial pressure of 3 mmHg. IAS/Shunts: The interatrial septum was not assessed.  LEFT VENTRICLE PLAX 2D  LVIDd:         5.60 cm LVIDs:         4.90 cm LV PW:         0.90 cm LV IVS:        0.90 cm LVOT diam:     1.80 cm  3D Volume EF: LV SV:         41       3D EF:        43 % LV SV Index:   28       LV EDV:       168 ml LVOT Area:     2.54 cm LV ESV:       96 ml                         LV SV:        72 ml RIGHT VENTRICLE          IVC RV Basal diam:  3.60 cm  IVC diam: 1.30 cm LEFT ATRIUM             Index       RIGHT ATRIUM           Index LA diam:        3.60 cm 2.52 cm/m  RA Area:     12.50 cm LA Vol (A2C):   70.1 ml 48.98 ml/m RA Volume:   27.50 ml  19.22 ml/m LA Vol (A4C):   47.7 ml 33.33 ml/m LA Biplane Vol: 57.9 ml 40.46 ml/m  AORTIC VALVE AV Area (Vmax):    1.43 cm AV Area (  Vmean):   1.28 cm AV Area (VTI):     1.43 cm AV Vmax:           168.00 cm/s AV Vmean:          112.000 cm/s AV VTI:            0.285 m AV Peak Grad:      11.3 mmHg AV Mean Grad:      6.0 mmHg LVOT Vmax:         94.60 cm/s LVOT Vmean:        56.400 cm/s LVOT VTI:          0.160 m LVOT/AV VTI ratio: 0.56  AORTA Ao Root diam: 2.90 cm Ao Asc diam:  3.20 cm  SHUNTS Systemic VTI:  0.16 m Systemic Diam: 1.80 cm Dietrich Pates MD Electronically signed by Dietrich Pates MD Signature Date/Time: 10/02/2020/12:23:41 PM    Final (Updated)    CT HEAD CODE STROKE WO CONTRAST  Result Date: 10/01/2020 CLINICAL DATA:  Code stroke. Acute stroke presentation. Deficit not described. EXAM: CT HEAD WITHOUT CONTRAST TECHNIQUE: Contiguous axial images were obtained from the base of the skull through the vertex without intravenous contrast. COMPARISON:  None. FINDINGS: Brain: Generalized age related atrophy. Chronic appearing small vessel ischemic changes of the hemispheric white matter. Old lacunar infarction of the left thalamus. Old small vessel infarctions of the cerebellum. No finding of acute infarction. No mass, hemorrhage, hydrocephalus or extra-axial collection. Vascular: There is atherosclerotic calcification of the major vessels at the base of the  brain. Skull: Negative Sinuses/Orbits: Clear/normal Other: None ASPECTS ([place]berta Stroke Program Early CT Score) - Ganglionic level infarction (caudate, lentiform nuclei, internal capsule, insula, M1-M3 cortex): 7 - Supraganglionic infarction (M4-M6 cortex): 3 Total score (0-10 with 10 being normal): 10 IMPRESSION: 1. No acute finding by CT. Atrophy and chronic small-vessel ischemic changes. 2. ASPECTS is 10. 3. These results were communicated to Dr. Selina Cooley At 1:22 pmon 3/20/2022by text page via the [facility] messaging system. Electronically Signed   By: Paulina Fusi M.D.   On: 10/01/2020 13:22   VAS [place] LOWER EXTREMITY VENOUS (DVT)  Result Date: 10/02/2020  Lower Venous DVT Study Indications: Stroke.  Comparison Study: no prior Performing Technologist: Blanch Media RVS  Examination Guidelines: A complete evaluation includes B-mode imaging, spectral Doppler, color Doppler, and power Doppler as needed of all accessible portions of each vessel. Bilateral testing is considered an integral part of a complete examination. Limited examinations for reoccurring indications may be performed as noted. The reflux portion of the exam is performed with the patient in reverse Trendelenburg.  +---------+---------------+---------+-----------+----------+--------------+  RIGHT     Compressibility Phasicity Spontaneity Properties Thrombus Aging  +---------+---------------+---------+-----------+----------+--------------+  CFV       Full            Yes       Yes                                    +---------+---------------+---------+-----------+----------+--------------+  SFJ       Full                                                             +---------+---------------+---------+-----------+----------+--------------+  FV Prox  Full                                                             +---------+---------------+---------+-----------+----------+--------------+  FV Mid    Full                                                              +---------+---------------+---------+-----------+----------+--------------+  FV Distal Full                                                             +---------+---------------+---------+-----------+----------+--------------+  PFV       Full                                                             +---------+---------------+---------+-----------+----------+--------------+  POP       Full            Yes       Yes                                    +---------+---------------+---------+-----------+----------+--------------+  PTV       Full                                                             +---------+---------------+---------+-----------+----------+--------------+  PERO      Full                                                             +---------+---------------+---------+-----------+----------+--------------+   +---------+---------------+---------+-----------+----------+--------------+  LEFT      Compressibility Phasicity Spontaneity Properties Thrombus Aging  +---------+---------------+---------+-----------+----------+--------------+  CFV       Full            Yes       Yes                                    +---------+---------------+---------+-----------+----------+--------------+  SFJ       Full                                                             +---------+---------------+---------+-----------+----------+--------------+  FV Prox   Full                                                             +---------+---------------+---------+-----------+----------+--------------+  FV Mid    Full                                                             +---------+---------------+---------+-----------+----------+--------------+  FV Distal Full                                                             +---------+---------------+---------+-----------+----------+--------------+  PFV       Full                                                              +---------+---------------+---------+-----------+----------+--------------+  POP       Full            Yes       Yes                                    +---------+---------------+---------+-----------+----------+--------------+  PTV       Full                                                             +---------+---------------+---------+-----------+----------+--------------+  PERO      Full                                                             +---------+---------------+---------+-----------+----------+--------------+     Summary: BILATERAL: - No evidence of deep vein thrombosis seen in the lower extremities, bilaterally. - No evidence of superficial venous thrombosis in the lower extremities, bilaterally. -No evidence of popliteal cyst, bilaterally.   *See table(s) above for measurements and observations. Electronically signed by Lemar Livings MD on 10/02/2020 at 11:20:48 AM.    Final    CT ANGIO HEAD CODE STROKE  Result Date: 10/01/2020 CLINICAL DATA:  Slurred speech and facial droop. EXAM: CT ANGIOGRAPHY HEAD AND NECK TECHNIQUE: Multidetector CT imaging of the head and neck was performed using the standard protocol during bolus administration of intravenous contrast. Multiplanar CT image reconstructions and MIPs were obtained to evaluate the vascular anatomy. Carotid stenosis measurements (when applicable) are  obtained utilizing NASCET criteria, using the distal internal carotid diameter as the denominator. CONTRAST:  53mL OMNIPAQUE IOHEXOL 350 MG/ML SOLN COMPARISON:  Head CT same day FINDINGS: CTA NECK FINDINGS Aortic arch: Aortic atherosclerosis and ectasia. Branching pattern is normal without origin stenosis. Right carotid system: Common carotid artery widely patent to the carotid bifurcation. Calcified plaque at the bifurcation and ICA bulb but no stenosis. Cervical ICA widely patent beyond that. Left carotid system: Common carotid artery widely patent to the bifurcation. Minimal calcified plaque at  the carotid bifurcation. No stenosis. Cervical ICA widely patent. Vertebral arteries: Vertebral artery origins are widely patent. Vertebral arteries appear normal through the cervical region to the foramen magnum, though detail is somewhat limited due to extensive venous collateral flow adjacent to the vertebral arteries. Skeleton: Ordinary cervical spondylosis and facet osteoarthritis. Other neck: No mass or lymphadenopathy. Previous left thyroidectomy. Multiple nodules within the right lobe of the thyroid, the largest measuring 1.5 cm in diameter. Upper chest: Minimal emphysema in the upper lobes. Apparent occlusion of the innominate vein, possibly due to extrinsic compression between the arterial structures and right clavicular head. This is responsible for the pattern of venous drainage described above. Review of the MIP images confirms the above findings CTA HEAD FINDINGS Anterior circulation: Both internal carotid arteries are patent through the skull base and siphon regions. Ordinary mild siphon atherosclerotic calcification without stenosis. The anterior and middle cerebral vessels are patent. No large or medium vessel occlusion. No proximal stenosis. No aneurysm or vascular malformation. Fetal origin of the right PCA. Posterior circulation: Both vertebral arteries are patent through the foramen magnum to the basilar. No basilar stenosis. Posterior circulation branch vessels are normal. Venous sinuses: Patent and normal. Anatomic variants: None other significant. Review of the MIP images confirms the above findings IMPRESSION: 1. Aortic atherosclerosis and ectasia. 2. Mild atherosclerotic change at both carotid bifurcations but without stenosis. 3. No intracranial large or medium vessel occlusion or correctable proximal stenosis. 4. Apparent occlusion of the innominate vein, possibly due to extrinsic compression between the arterial structures and right clavicular head. This is responsible for the pattern of  venous collateral drainage described in the body of the report. 5. Previous left thyroidectomy. Multiple nodules within the right lobe of the thyroid, the largest measuring 1.4 cm in diameter. Recommend thyroid US (ref: J Am Coll Radiol. 2015 Feb;12(2): 143-50). Aortic Atherosclerosis (ICD10-I70.0) and Emphysema (ICD10-J43.9). Electronically Signed   By: Paulina Fusi M.D.   On: 10/01/2020 15:03   CT ANGIO NECK CODE STROKE  Result Date: 10/01/2020 CLINICAL DATA:  Slurred speech and facial droop. EXAM: CT ANGIOGRAPHY HEAD AND NECK TECHNIQUE: Multidetector CT imaging of the head and neck was performed using the standard protocol during bolus administration of intravenous contrast. Multiplanar CT image reconstructions and MIPs were obtained to evaluate the vascular anatomy. Carotid stenosis measurements (when applicable) are obtained utilizing NASCET criteria, using the distal internal carotid diameter as the denominator. CONTRAST:  74mL OMNIPAQUE IOHEXOL 350 MG/ML SOLN COMPARISON:  Head CT same day FINDINGS: CTA NECK FINDINGS Aortic arch: Aortic atherosclerosis and ectasia. Branching pattern is normal without origin stenosis. Right carotid system: Common carotid artery widely patent to the carotid bifurcation. Calcified plaque at the bifurcation and ICA bulb but no stenosis. Cervical ICA widely patent beyond that. Left carotid system: Common carotid artery widely patent to the bifurcation. Minimal calcified plaque at the carotid bifurcation. No stenosis. Cervical ICA widely patent. Vertebral arteries: Vertebral artery origins are widely patent. Vertebral arteries appear normal  through the cervical region to the foramen magnum, though detail is somewhat limited due to extensive venous collateral flow adjacent to the vertebral arteries. Skeleton: Ordinary cervical spondylosis and facet osteoarthritis. Other neck: No mass or lymphadenopathy. Previous left thyroidectomy. Multiple nodules within the right lobe of the  thyroid, the largest measuring 1.5 cm in diameter. Upper chest: Minimal emphysema in the upper lobes. Apparent occlusion of the innominate vein, possibly due to extrinsic compression between the arterial structures and right clavicular head. This is responsible for the pattern of venous drainage described above. Review of the MIP images confirms the above findings CTA HEAD FINDINGS Anterior circulation: Both internal carotid arteries are patent through the skull base and siphon regions. Ordinary mild siphon atherosclerotic calcification without stenosis. The anterior and middle cerebral vessels are patent. No large or medium vessel occlusion. No proximal stenosis. No aneurysm or vascular malformation. Fetal origin of the right PCA. Posterior circulation: Both vertebral arteries are patent through the foramen magnum to the basilar. No basilar stenosis. Posterior circulation branch vessels are normal. Venous sinuses: Patent and normal. Anatomic variants: None other significant. Review of the MIP images confirms the above findings IMPRESSION: 1. Aortic atherosclerosis and ectasia. 2. Mild atherosclerotic change at both carotid bifurcations but without stenosis. 3. No intracranial large or medium vessel occlusion or correctable proximal stenosis. 4. Apparent occlusion of the innominate vein, possibly due to extrinsic compression between the arterial structures and right clavicular head. This is responsible for the pattern of venous collateral drainage described in the body of the report. 5. Previous left thyroidectomy. Multiple nodules within the right lobe of the thyroid, the largest measuring 1.4 cm in diameter. Recommend thyroid US (ref: J Am Coll Radiol. 2015 Feb;12(2): 143-50). Aortic Atherosclerosis (ICD10-I70.0) and Emphysema (ICD10-J43.9). Electronically Signed   By: Paulina Fusi M.D.   On: 10/01/2020 15:03    Cardiac Studies   EF 25%  Patient Profile     87 y.o. female here with acute stroke with newly  discovered cardiomyopathy EF 25%  Assessment & Plan   Acute systolic heart failure/cardiomyopathy -Continue with conservative management.  Given recent stroke, permissive hypertension, holding off from  angiotensin receptor blocker spironolactone or SGLT 2 inhibitor -I will add low-dose Toprol-XL 25 mg a day which should not affect blood pressure significantly.  She did have nonsustained VT reported however I do not see this on telemetry review.  Left bundle branch block is present may be this was confused for VT. -Perhaps as an outpatient,  ARB would be permissible. -For now on aspirin clopidogrel and atorvastatin 20 mg.  Seems reasonable. -Had discussion with neurology, Dr.Xu.  They would like to utilize Eliquis 2.5 mg twice a day given her cardiomyopathy and recent stroke.  In this situation we would stop the aspirin and clopidogrel.  This makes sense.  If her EF improves after 3 months, switch to antiplatelet.  Nonsustained ventricular tachycardia -I will add very low-dose beta-blocker, metoprolol succinate 25 mg a day.  Although, perhaps her left bundle branch block was confused for VT.  Left bundle branch block -Chronic.  Okay for rehab from a cardiac perspective when bed is available.  DNR    For questions or updates, please contact [facility] Please consult [email or web address] for contact info under        Signed, Donato Schultz, MD  10/03/2020, 9:48 AM

## 2020-10-03 NOTE — PMR Pre-admission (Signed)
PMR Admission Coordinator Pre-Admission Assessment  Patient: Leslie [name] is an 87 y.o., female MRN: [id number] DOB: 01-29-1927 Height: 5\' 2"  (157.5 cm) Weight: 45.9 kg  Insurance Information HMO:     PPO:      PCP:      IPA:      80/20:      OTHER:  PRIMARY: Medicare a and b      Policy#: [id number]      Subscriber: pt Benefits:  Phone #: online     Name: 3/22 Eff. Date: 07/15/1993     Deduct: $1556      Out of Pocket Max: none      Life Max: none CIR: 100%      SNF: 20 full days Outpatient: 80%     Co-Pay: 20% Home Health: 100%      Co-Pay: none DME: 80%     Co-Pay: 20% Providers: pt choice  SECONDARY: Combined      Policy#: [id number]  Financial Counselor:       Phone#:   The "Data Collection Information Summary" for patients in Inpatient Rehabilitation Facilities with attached "Privacy Act [place] Records" was provided and verbally reviewed with: Patient  Emergency Contact Information Contact Information    Name Relation Home Work [address]   Tora Kindred Son   [phone]   Vira Blanco   [phone]      Current Medical History  Patient Admitting Diagnosis: CVA  History of Present Illness: 87 y.o. RH-female with history of glaucoma, T2DM, Vitamin D deficiency, chronic back/neck pain (gets manipulation twice a month) who was admitted on 10/01/20 with slurred speech and facial droop after church service.   CTA head/neck showed apparent occlusion of innominate vein possibly due to extensive compression and multiple right thyroid nodules.  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  2D echo showed severely depressed LVEF with EF 25% and akinesis of septum, inferior and apical wall and hypokinesis elsewhere. BLE Dopplers were negative for DVT.  Neurology recommended DOAC give embolic stroke in setting of low EF  with follow up TTE in 3 months and transition to ASA once EF>35%. Cardiology consulted for input on CM and recommended holding off any goal directed therapy to allow for adequate perfusion and follow up on outpatient basis. She was treated with fosfomycin due to evidence of UTI. Therapy evaluations done revealing dizziness with head turns as well as cognitive deficits with poor safety awareness and decreased posture.   Complete NIHSS TOTAL: 1  Patient's medical record  has been reviewed by the rehabilitation admission coordinator and physician.  Past Medical History  Past Medical History:  Diagnosis Date  . Anxiety disorder   . Glaucoma   . HTN (hypertension)   . Hyperglycemia   . Hyperlipidemia   . Vitamin D deficiency     Family History   family history includes Diabetes in her father; Heart attack in her brother, brother, and father; Heart disease in her brother and brother; Hypertension in her mother; Stroke in her mother.  Prior Rehab/Hospitalizations Has the patient had prior rehab or hospitalizations prior to admission? Yes  Has the patient had major surgery during 100 days prior to admission? No   Current Medications  Current Facility-Administered Medications:  .  acetaminophen (TYLENOL) tablet 650 mg, 650 mg, Oral, Q4H PRN, 650 mg at 10/03/20 0320 **OR** acetaminophen (TYLENOL) 160 MG/5ML solution 650  mg, 650 mg, Per Tube, Q4H PRN **OR** acetaminophen (TYLENOL) suppository 650 mg, 650 mg, Rectal, Q4H PRN, Smith, Rondell A, MD .  aspirin EC tablet 81 mg, 81 mg, Oral, Daily, Tamala Julian, Rondell A, MD, 81 mg at 10/03/20 0953 .  atorvastatin (LIPITOR) tablet 20 mg, 20 mg, Oral, Daily, Rosalin Hawking, MD, 20 mg at 10/03/20 0953 .  clopidogrel (PLAVIX) tablet 75 mg, 75 mg, Oral, Daily, Rosalin Hawking, MD, 75 mg at 10/03/20 0953 .  enoxaparin (LOVENOX) injection 40 mg, 40 mg, Subcutaneous, Q24H, Smith, Rondell A, MD, 40 mg at 10/02/20 1725 .  hydrALAZINE (APRESOLINE) injection 10 mg, 10 mg,  Intravenous, Q4H PRN, Smith, Rondell A, MD .  latanoprost (XALATAN) 0.005 % ophthalmic solution 1 drop, 1 drop, Both Eyes, QHS, Smith, Rondell A, MD, 1 drop at 10/02/20 2109 .  LORazepam (ATIVAN) injection 0.5 mg, 0.5 mg, Intravenous, Once PRN, Tamala Julian, Rondell A, MD .  metoprolol succinate (TOPROL-XL) 24 hr tablet 25 mg, 25 mg, Oral, Daily, Jerline Pain, MD, 25 mg at 10/03/20 0959 .  senna-docusate (Senokot-S) tablet 1 tablet, 1 tablet, Oral, QHS PRN, Tamala Julian, Rondell A, MD .  traZODone (DESYREL) tablet 25 mg, 25 mg, Oral, QHS PRN, Mansy, Arvella Merles, MD  Patients Current Diet:  Diet Order            Diet Heart Room service appropriate? Yes; Fluid consistency: Thin  Diet effective now                 Precautions / Restrictions Precautions Precautions: Fall Restrictions Weight Bearing Restrictions: No   Has the patient had 2 or more falls or a fall with injury in the past year? No  Prior Activity Level Community (5-7x/wk): Independent without AD; drives, gets out daily in the community  Prior Functional Level Self Care: Did the patient need help bathing, dressing, using the toilet or eating? Independent  Indoor Mobility: Did the patient need assistance with walking from room to room (with or without device)? Independent  Stairs: Did the patient need assistance with internal or external stairs (with or without device)? Independent  Functional Cognition: Did the patient need help planning regular tasks such as shopping or remembering to take medications? Independent  Home Assistive Devices / Equipment Home Equipment: Shower seat,Walker - 2 wheels,Grab bars - toilet  Prior Device Use: Indicate devices/aids used by the patient prior to current illness, exacerbation or injury? None of the above  Current Functional Level Cognition  Arousal/Alertness: Awake/alert Overall Cognitive Status: Impaired/Different from baseline Orientation Level: Oriented to person,Oriented to place,Oriented  to time,Disoriented to situation Safety/Judgement: Decreased awareness of deficits,Decreased awareness of safety General Comments: some slow processing and decreased problem sovling, decreased memory but able to self correct month (originally voicing january) Attention: Sustained Sustained Attention: Appears intact Memory: Impaired Memory Impairment: Decreased recall of new information Awareness: Impaired Awareness Impairment: Intellectual impairment Problem Solving: Appears intact (intact at basic level) Safety/Judgment: Appears intact    Extremity Assessment (includes Sensation/Coordination)  Upper Extremity Assessment: Defer to OT evaluation LUE Deficits / Details: mild dysmetria (fine and gross motor), decreased sesnsation distally LUE Sensation: decreased light touch LUE Coordination: decreased fine motor,decreased gross motor  Lower Extremity Assessment: Generalized weakness    ADLs  Overall ADL's : Needs assistance/impaired Grooming: Min guard,Standing Upper Body Bathing: Set up,Sitting Lower Body Bathing: Min guard,Sit to/from stand Upper Body Dressing : Set up,Sitting Lower Body Dressing: Minimal assistance,Sit to/from stand Toilet Transfer: Min guard,Ambulation,Grab bars,Regular Toilet Toileting- Clothing Manipulation and Hygiene: Minimal  assistance,Sit to/from stand Toileting - Water quality scientist Details (indicate cue type and reason): clothing mgmt Functional mobility during ADLs: Min guard General ADL Comments: patient limited by generalized weakness, balance    Mobility  Overal bed mobility: Needs Assistance Bed Mobility: Supine to Sit Supine to sit: Supervision,HOB elevated General bed mobility comments: Increased time to complete. Noted increased processing time to figure out how to get feet out from under covers. Arms tangled in lines under the blankets. Gross supervision overall.    Transfers  Overall transfer level: Needs assistance Equipment used: 1  person hand held assist Transfers: Sit to/from Stand Sit to Stand: Min assist General transfer comment: Assist required to gain/maintain standing balance EOB.    Ambulation / Gait / Stairs / Wheelchair Mobility  Ambulation/Gait Ambulation/Gait assistance: Herbalist (Feet): 175 Feet Assistive device: 1 person hand held assist Gait Pattern/deviations: Step-through pattern,Decreased stride length,Drifts right/left,Trunk flexed General Gait Details: Pt reporting she is off balance because she is walking in hospital socks instead of shoes. With head turns, pt reports dizziness and balance deficits more prominent. Min assist throughout gait training at gait belt. Gait velocity: Decreased Gait velocity interpretation: <1.8 ft/sec, indicate of risk for recurrent falls    Posture / Balance Balance Overall balance assessment: Needs assistance Sitting-balance support: No upper extremity supported,Feet supported Sitting balance-Leahy Scale: Good Standing balance support: No upper extremity supported,During functional activity Standing balance-Leahy Scale: Fair Standing balance comment: min guard for safety/balance    Special needs/care consideration    Previous Home Environment  Living Arrangements: Alone  Lives With: Alone Available Help at Discharge: Family,Neighbor,Friend(s),Available PRN/intermittently Type of Home: House Home Layout: Two level,Able to live on main level with bedroom/bathroom Alternate Level Stairs-Number of Steps: flight Home Access: Stairs to enter Entrance Stairs-Rails: Right Entrance Stairs-Number of Steps: 2 Bathroom Shower/Tub: Ambulance person: Handicapped height Bathroom Accessibility: Yes How Accessible: Accessible via walker Home Care Services: No Additional Comments: reports usually takes baths in the tub  Discharge Living Setting Plans for Discharge Living Setting: Patient's home,Alone,House Type of Home at  Discharge: House Discharge Home Layout: Two level,Able to live on main level with bedroom/bathroom Alternate Level Stairs-Number of Steps: flight Discharge Home Access: Stairs to enter Entrance Stairs-Rails: Right Entrance Stairs-Number of Steps: 2 Discharge Bathroom Shower/Tub: Tub/shower unit,Walk-in shower Discharge Bathroom Toilet: Handicapped height Discharge Bathroom Accessibility: Yes How Accessible: Accessible via walker Does the patient have any problems obtaining your medications?: No  Social/Family/Support Systems Contact Information: [name], Son Anticipated Caregiver: [name] and friends Anticipated Ambulance person Information: see above Ability/Limitations of Caregiver: [name] retired and lives in [place] and can assist. Pt very independent Caregiver Availability: Intermittent Discharge Plan Discussed with Primary Caregiver: Yes Is Caregiver In Agreement with Plan?: Yes Does Caregiver/Family have Issues with Lodging/Transportation while Pt is in Rehab?: No  Goals Patient/Family Goal for Rehab: Mod I to supervision with PT, OT and SLP Expected length of stay: [facility]OS 7 to 10 days Pt/Family Agrees to Admission and willing to participate: Yes Program Orientation Provided & Reviewed with Pt/Caregiver Including Roles  & Responsibilities: Yes  Decrease burden of Care through IP rehab admission: n/a  Possible need for SNF placement upon discharge: not anticipated  Patient Condition: I have reviewed medical records from [facility] , spoken with CM, and patient and son. I met with patient at the bedside for inpatient rehabilitation assessment.  Patient will benefit from ongoing PT, OT and SLP, can actively participate in 3 hours of therapy a  day 5 days of the week, and can make measurable gains during the admission.  Patient will also benefit from the coordinated team approach during an Inpatient Acute Rehabilitation admission.  The patient will receive intensive therapy as  well as Rehabilitation physician, nursing, social worker, and care management interventions.  Due to bladder management, bowel management, safety, skin/wound care, disease management, medication administration, pain management and patient education the patient requires 24 hour a day rehabilitation nursing.  The patient is currently min assist overall with mobility and basic ADLs.  Discharge setting and therapy post discharge at home with home health is anticipated.  Patient has agreed to participate in the Acute Inpatient Rehabilitation Program and will admit today.  Preadmission Screen Completed By:  Cleatrice Burke, 10/03/2020 11:52 AM ______________________________________________________________________   Discussed status with Dr. Naaman Plummer  on  10/03/2020 at  1158 and received approval for admission today.  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 Does the need for close, 24 hr/day Medical supervision in concert with the patient's rehab needs make it unreasonable for this patient to be served in a less intensive setting? Yes 2. Co-Morbidities requiring supervision/potential complications: DM2, chronic LBP, UTI 3. Due to bladder management, bowel management, safety, skin/wound care, disease management, medication administration, pain management and patient education, does the patient require 24 hr/day rehab nursing? Yes 4. Does the patient require coordinated care of a physician, rehab nurse, PT, OT, and SLP to address physical and functional deficits in the context of the above medical diagnosis(es)? Yes Addressing deficits in the following areas: balance, endurance, locomotion, strength, transferring, bowel/bladder control, bathing, dressing, feeding, grooming, toileting, cognition, speech, swallowing and psychosocial support 5. Can the patient  actively participate in an intensive therapy program of at least 3 hrs of therapy 5 days a week? Yes 6. The potential for patient to make measurable gains while on inpatient rehab is excellent 7. Anticipated functional outcomes upon discharge from inpatient rehab: modified independent and supervision PT, modified independent and supervision OT, modified independent and supervision SLP 8. Estimated rehab length of stay to reach the above functional goals is: 7-10 days 9. Anticipated discharge destination: Home 10. Overall Rehab/Functional Prognosis: excellent   MD Signature: [name], MD, [facility] 10/03/2020

## 2020-10-03 NOTE — H&P (Signed)
Physical Medicine and Rehabilitation Admission H&P        Chief Complaint  Patient presents with  . Functional  deficits due to Stroke      HPI: Leslie [name] is a 87 year old female with history of glaucoma, anxiety d/o, Vitamin D deficiency, chronic neck/back pain who was admitted on 10/01/2020 with slurred speech and facial droop after attending a church service.  CTA head/neck showed apparent occlusion of the innominate vein possibly due to extensive compression and multiple right thyroid nodules.  MRI brain showed several small/punctate infarcts in right frontoparietal vertex and small acute infarct left lateral cerebellum suggestive of embolic disease from heart or ascending aorta.  2D echo showed severely depressed LVEF with EF of 25% with akinesis of septum, inferior and apical wall and hypokinesis elsewhere.  BLE Dopplers were negative for DVT.  She was found to have evidence of UTI and was treated with fosfomycin. UCS ordered and showed >100,000 gram negative rods and pending.     Cardiology was consulted for input on cardiomyopathy and recommended holding off on any goal-directed therapy however she did have episode of 17 beats asymptomatic NSVT last p.m. and "very low dose metoprolol" added per per Dr. Anne Fu. Dr. Roda Shutters recommended DOAC given embolic stroke in setting of low EF with follow-up TTE in 3 months and transition to ASA once EF greater than 35%.   Therapy evaluations completed revealing balance deficits with awareness of deficits.  CIR recommended due to functional decline.   Review of Systems  Constitutional: Negative for chills and fever.  HENT: Positive for hearing loss. Negative for ear pain.   Eyes: Negative for blurred vision and double vision.  Respiratory: Negative for cough.   Cardiovascular: Negative for chest pain and palpitations.  Gastrointestinal: Positive for constipation. Negative for abdominal pain, nausea and vomiting.  Genitourinary: Negative for  dysuria and urgency.  Musculoskeletal: Negative for myalgias and neck pain.  Skin: Negative for itching.  Neurological: Positive for speech change, focal weakness and weakness. Negative for dizziness.  Endo/Heme/Allergies: Does not bruise/bleed easily.  Psychiatric/Behavioral: Negative for depression and suicidal ideas.            Past Medical History:  Diagnosis Date  . Anxiety disorder    . Glaucoma    . Hearing loss    . HTN (hypertension)    . Hyperglycemia    . Hyperlipidemia    . Vitamin D deficiency        Past Surgical History:  Procedure Laterality Date  . GLAUCOMA SURGERY   2013  . TONSILLECTOMY               Family History  Problem Relation Age of Onset  . Heart attack Brother    . Heart disease Brother    . Heart attack Brother    . Heart disease Brother    . Hypertension Mother    . Stroke Mother    . Heart attack Father    . Diabetes Father        Social History:  Lives alone --independent without AD and drives. Has two sons who live out of town-- worked for 10 years then stayed at home to raise her children. She smoked for about 30 years  reports that she quit smoking about 41 years ago. She has never used smokeless tobacco. She reports that she does not drink alcohol and does not use drugs.         Allergies  Allergen Reactions  . Adhesive [Tape]    . Advil [Ibuprofen]    . Atenolol              Medications Prior to Admission  Medication Sig Dispense Refill  . Cholecalciferol (VITAMIN D3) 125 MCG (5000 UT) CAPS Take 5,000 Units by mouth daily.      . hydrocortisone (ANUSOL-HC) 2.5 % rectal cream Place 1 application rectally 2 (two) times daily. 30 g 0  . latanoprost (XALATAN) 0.005 % ophthalmic solution Place 1 drop into both eyes daily.      . SENNA PO Take 1 tablet by mouth daily.      . hydrocortisone (ANUSOL-HC) 25 MG suppository Place 1 suppository (25 mg total) rectally 2 (two) times daily. (Patient not taking: No sig reported) 12  suppository 0      Drug Regimen Review  Drug regimen was reviewed and remains appropriate with no significant issues identified   Home: Home Living Family/patient expects to be discharged to:: Private residence Living Arrangements: Alone Available Help at Discharge: Family,Neighbor,Friend(s),Available PRN/intermittently Type of Home: House Home Access: Stairs to enter [employer] of Steps: 2 Entrance Stairs-Rails: Right Home Layout: Two level,Able to live on main level with bedroom/bathroom Alternate Level Stairs-Number of Steps: flight Bathroom Shower/Tub: Walk-in shower,Tub/shower unit [employer]: Handicapped height Bathroom Accessibility: Yes Home Equipment: Engineer, production - 2 wheels,Grab bars - toilet Additional Comments: reports usually takes baths in the tub  Lives With: Alone   Functional History: Prior Function Level of Independence: Independent Comments: Neighbors bring food in a lot for her, and she warms it up in the microwave. Goes out to lunch almost daily. Drives.   Functional Status:  Mobility: Bed Mobility Overal bed mobility: Needs Assistance Bed Mobility: Supine to Sit Supine to sit: Supervision,HOB elevated General bed mobility comments: Increased time to complete. Noted increased processing time to figure out how to get feet out from under covers. Arms tangled in lines under the blankets. Gross supervision overall. Transfers Overall transfer level: Needs assistance Equipment used: 1 person hand held assist Transfers: Sit to/from Stand Sit to Stand: Min assist General transfer comment: Assist required to gain/maintain standing balance EOB. Ambulation/Gait Ambulation/Gait assistance: Min assist Gait Distance (Feet): 175 Feet Assistive device: 1 person hand held assist Gait Pattern/deviations: Step-through pattern,Decreased stride length,Drifts right/left,Trunk flexed General Gait Details: Pt reporting she is off balance because she  is walking in hospital socks instead of shoes. With head turns, pt reports dizziness and balance deficits more prominent. Min assist throughout gait training at gait belt. Gait velocity: Decreased Gait velocity interpretation: <1.8 ft/sec, indicate of risk for recurrent falls   ADL: ADL Overall ADL's : Needs assistance/impaired Grooming: Min guard,Standing Upper Body Bathing: Set up,Sitting Lower Body Bathing: Min guard,Sit to/from stand Upper Body Dressing : Set up,Sitting Lower Body Dressing: Minimal assistance,Sit to/from stand Toilet Transfer: Min guard,Ambulation,Grab bars,Regular Toilet Toileting- Clothing Manipulation and Hygiene: Minimal assistance,Sit to/from stand Toileting - Clothing Manipulation Details (indicate cue type and reason): clothing mgmt Functional mobility during ADLs: Min guard General ADL Comments: patient limited by generalized weakness, balance   Cognition: Cognition Overall Cognitive Status: Impaired/Different from baseline Arousal/Alertness: Awake/alert Orientation Level: Oriented to person,Oriented to place,Oriented to time,Disoriented to situation Attention: Sustained Sustained Attention: Appears intact Memory: Impaired Memory Impairment: Decreased recall of new information Awareness: Impaired Awareness Impairment: Intellectual impairment Problem Solving: Appears intact (intact at basic level) Safety/Judgment: Appears intact Cognition Arousal/Alertness: Awake/alert Behavior During Therapy: WFL for tasks assessed/performed Overall Cognitive Status: Impaired/Different from  baseline Area of Impairment: Memory,Problem solving,Safety/judgement Orientation Level: Disoriented to,Place,Time (Cannot recall name of hospital but knows she is in one; does not know day of the week or day of the month) Memory: Decreased short-term memory Safety/Judgement: Decreased awareness of deficits,Decreased awareness of safety Problem Solving: Slow processing,Requires  verbal cues General Comments: some slow processing and decreased problem sovling, decreased memory but able to self correct month (originally voicing january)   Physical Exam: Blood pressure 121/83, pulse 75, temperature 98.1 F (36.7 C), temperature source Oral, resp. rate 15, height 5\' 2"  (1.575 m), weight 45.9 kg, SpO2 97 %. Physical Exam Constitutional:      General: She is not in acute distress.    Appearance: Normal appearance. She is normal weight.  HENT:     Head: Normocephalic and atraumatic.     Right Ear: External ear normal.     Left Ear: External ear normal.     Nose: Nose normal.     Mouth/Throat:     Mouth: Mucous membranes are moist.     Pharynx: Oropharynx is clear.  Eyes:     Pupils: Pupils are equal, round, and reactive to light.  Cardiovascular:     Rate and Rhythm: Normal rate and regular rhythm.     Heart sounds: No murmur heard. No gallop.   Pulmonary:     Effort: Pulmonary effort is normal. No respiratory distress.     Breath sounds: Normal breath sounds. No wheezing.  Abdominal:     General: Abdomen is flat. There is no distension.     Palpations: Abdomen is soft.  Musculoskeletal:        General: No swelling or deformity.     Cervical back: Normal range of motion and neck supple.  Skin:    General: Skin is warm and dry.  Neurological:     Mental Status: She is alert.     Comments: Alert and oriented x 3. Normal insight and awareness. Intact Memory. Normal language and speech. Cranial nerve exam unremarkable except for mild left C7. Is slightly HOH also. Mild pronator drift the left. RUE 5/5. LUE 4 to 4+/5 prox to distaly. RLE 5/5. LLE 4+/5. Senses pain and light touch in all 4's. DTR's 2+.   Psychiatric:        Mood and Affect: Mood normal.        Behavior: Behavior normal.        Thought Content: Thought content normal.        Lab Results Last 48 Hours        Results for orders placed or performed during the hospital encounter of 10/01/20  (from the past 48 hour(s))  Protime-INR     Status: None    Collection Time: 10/01/20  1:13 PM  Result Value Ref Range    Prothrombin Time 13.4 11.4 - 15.2 seconds    INR 1.1 0.8 - 1.2      Comment: (NOTE) INR goal varies based on device and disease states. Performed at [facility], [address] [place], [place] [address]    APTT     Status: None    Collection Time: 10/01/20  1:13 PM  Result Value Ref Range    aPTT 25 24 - 36 seconds      Comment: Performed at [facility], [address] [place], [place] [address]  CBC     Status: Abnormal    Collection Time: 10/01/20  1:13 PM  Result Value Ref Range  WBC 8.9 4.0 - 10.5 K/uL    RBC 4.22 3.87 - 5.11 MIL/uL    Hemoglobin 12.3 12.0 - 15.0 g/dL    HCT 16.1 09.6 - 04.5 %    MCV 88.2 80.0 - 100.0 fL    MCH 29.1 26.0 - 34.0 pg    MCHC 33.1 30.0 - 36.0 g/dL    RDW 40.9 81.1 - 91.4 %    Platelets 444 (H) 150 - 400 K/uL    nRBC 0.0 0.0 - 0.2 %      Comment: Performed at [facility], [address], [place], [place] [address]  Differential     Status: None    Collection Time: 10/01/20  1:13 PM  Result Value Ref Range    Neutrophils Relative % 77 %    Neutro Abs 7.0 1.7 - 7.7 K/uL    Lymphocytes Relative 9 %    Lymphs Abs 0.8 0.7 - 4.0 K/uL    Monocytes Relative 11 %    Monocytes Absolute 1.0 0.1 - 1.0 K/uL    Eosinophils Relative 1 %    Eosinophils Absolute 0.1 0.0 - 0.5 K/uL    Basophils Relative 1 %    Basophils Absolute 0.1 0.0 - 0.1 K/uL    Immature Granulocytes 1 %    Abs Immature Granulocytes 0.05 0.00 - 0.07 K/uL      Comment: Performed at [facility], [address], [place], [place] [address]  Comprehensive metabolic panel     Status: Abnormal    Collection Time: 10/01/20  1:13 PM  Result Value Ref Range    Sodium 132 (L) 135 - 145 mmol/L    Potassium 3.8 3.5 - 5.1 mmol/L    Chloride 100 98 - 111 mmol/L    CO2 25 22 - 32 mmol/L    Glucose, Bld 108 (H) 70 - 99 mg/dL      Comment:  Glucose reference range applies only to samples taken after fasting for at least 8 hours.    BUN 19 8 - 23 mg/dL    Creatinine, Ser 8.65 0.44 - 1.00 mg/dL    Calcium 9.5 8.9 - 78.4 mg/dL    Total Protein 6.6 6.5 - 8.1 g/dL    Albumin 3.1 (L) 3.5 - 5.0 g/dL    AST 17 15 - 41 U/L    ALT 15 0 - 44 U/L    Alkaline Phosphatase 60 38 - 126 U/L    Total Bilirubin 1.3 (H) 0.3 - 1.2 mg/dL    GFR, Estimated >69 >62 mL/min      Comment: (NOTE) Calculated using the CKD-EPI Creatinine Equation (2021)      Anion gap 7 5 - 15      Comment: Performed at [facility], [address], [place], [place] [address]  I-stat chem 8, ED     Status: Abnormal    Collection Time: 10/01/20  1:20 PM  Result Value Ref Range    Sodium 138 135 - 145 mmol/L    Potassium 3.9 3.5 - 5.1 mmol/L    Chloride 102 98 - 111 mmol/L    BUN 21 8 - 23 mg/dL    Creatinine, Ser 1.32 0.44 - 1.00 mg/dL    Glucose, Bld 440 (H) 70 - 99 mg/dL      Comment: Glucose reference range applies only to samples taken after fasting for at least 8 hours.    Calcium, Ion 1.32 1.15 - 1.40 mmol/L  TCO2 27 22 - 32 mmol/L    Hemoglobin 12.9 12.0 - 15.0 g/dL    HCT 16.1 09.6 - 04.5 %  CBG monitoring, ED     Status: None    Collection Time: 10/01/20  2:24 PM  Result Value Ref Range    Glucose-Capillary 87 70 - 99 mg/dL      Comment: Glucose reference range applies only to samples taken after fasting for at least 8 hours.    Comment 1 Notify RN      Comment 2 Document in Chart    Resp Panel by RT-PCR (Flu A&B, Covid) Nasopharyngeal Swab     Status: None    Collection Time: 10/01/20  5:54 PM    Specimen: Nasopharyngeal Swab; Nasopharyngeal(NP) swabs in vial transport medium  Result Value Ref Range    SARS Coronavirus 2 by RT PCR NEGATIVE NEGATIVE      Comment: (NOTE) SARS-CoV-2 target nucleic acids are NOT DETECTED.   The SARS-CoV-2 RNA is generally detectable in upper respiratory specimens during the acute phase of infection. The  lowest concentration of SARS-CoV-2 viral copies this assay can detect is 138 copies/mL. A negative result does not preclude SARS-Cov-2 infection and should not be used as the sole basis for treatment or other patient management decisions. A negative result may occur with  improper specimen collection/handling, submission of specimen other than nasopharyngeal swab, presence of viral mutation(s) within the areas targeted by this assay, and inadequate number of viral copies(<138 copies/mL). A negative result must be combined with clinical observations, patient history, and epidemiological information. The expected result is Negative.   Fact Sheet for Patients:  BloggerCourse.com   Fact Sheet for Healthcare Providers:  SeriousBroker.it   This test is no t yet approved or cleared by the [place] FDA and  has been authorized for detection and/or diagnosis of SARS-CoV-2 by FDA under an Emergency Use Authorization (EUA). This EUA will remain  in effect (meaning this test can be used) for the duration of the COVID-19 declaration under Section 564(b)(1) of the Act, 21 U.S.C.section 360bbb-3(b)(1), unless the authorization is terminated  or revoked sooner.           Influenza A by PCR NEGATIVE NEGATIVE    Influenza B by PCR NEGATIVE NEGATIVE      Comment: (NOTE) The Xpert Xpress SARS-CoV-2/FLU/RSV plus assay is intended as an aid in the diagnosis of influenza from Nasopharyngeal swab specimens and should not be used as a sole basis for treatment. Nasal washings and aspirates are unacceptable for Xpert Xpress SARS-CoV-2/FLU/RSV testing.   Fact Sheet for Patients: BloggerCourse.com   Fact Sheet for Healthcare Providers: SeriousBroker.it   This test is not yet approved or cleared by the [place] FDA and has been authorized for detection and/or diagnosis of SARS-CoV-2 by FDA under  an Emergency Use Authorization (EUA). This EUA will remain in effect (meaning this test can be used) for the duration of the COVID-19 declaration under Section 564(b)(1) of the Act, 21 U.S.C. section 360bbb-3(b)(1), unless the authorization is terminated or revoked.   Performed at [facility], [address], [place], [place] [address]    Lipid panel     Status: None    Collection Time: 10/01/20  6:01 PM  Result Value Ref Range    Cholesterol 135 0 - 200 mg/dL    Triglycerides 51 <191 mg/dL    HDL 47 >47 mg/dL    Total CHOL/HDL Ratio 2.9 RATIO    VLDL 10  0 - 40 mg/dL    LDL Cholesterol 78 0 - 99 mg/dL      Comment:        Total Cholesterol/HDL:CHD Risk Coronary Heart Disease Risk Table                     Men   Women  1/2 Average Risk   3.4   3.3  Average Risk       5.0   4.4  2 X Average Risk   9.6   7.1  3 X Average Risk  23.4   11.0        Use the calculated Patient Ratio above and the CHD Risk Table to determine the patient's CHD Risk.        ATP III CLASSIFICATION (LDL):  <100     mg/dL   Optimal  474-259  mg/dL   Near or Above                    Optimal  130-159  mg/dL   Borderline  563-875  mg/dL   High  >643     mg/dL   Very High Performed at [facility], [address], [place], [place] [address]    Lactic acid, plasma     Status: None    Collection Time: 10/01/20  6:01 PM  Result Value Ref Range    Lactic Acid, Venous 0.9 0.5 - 1.9 mmol/L      Comment: Performed at [facility], [address], [place], [place] [address]  CK     Status: None    Collection Time: 10/01/20  6:01 PM  Result Value Ref Range    Total CK 55 38 - 234 U/L      Comment: Performed at [facility], [address], [place], [place] [address]  CBG monitoring, ED     Status: Abnormal    Collection Time: 10/01/20  6:53 PM  Result Value Ref Range    Glucose-Capillary 105 (H) 70 - 99 mg/dL      Comment: Glucose reference range applies only to samples taken after  fasting for at least 8 hours.    Comment 1 Notify RN    Urinalysis, Routine w reflex microscopic Urine, Clean Catch     Status: Abnormal    Collection Time: 10/01/20  8:40 PM  Result Value Ref Range    Color, Urine YELLOW YELLOW    APPearance HAZY (A) CLEAR    Specific Gravity, Urine >1.046 (H) 1.005 - 1.030    pH 6.0 5.0 - 8.0    Glucose, UA NEGATIVE NEGATIVE mg/dL    Hgb urine dipstick SMALL (A) NEGATIVE    Bilirubin Urine NEGATIVE NEGATIVE    Ketones, ur NEGATIVE NEGATIVE mg/dL    Protein, ur NEGATIVE NEGATIVE mg/dL    Nitrite POSITIVE (A) NEGATIVE    Leukocytes,Ua LARGE (A) NEGATIVE    RBC / HPF 11-20 0 - 5 RBC/hpf    WBC, UA >50 (H) 0 - 5 WBC/hpf    Bacteria, UA RARE (A) NONE SEEN    Squamous Epithelial / LPF 0-5 0 - 5      Comment: Performed at [facility], [address] [place], [place] [address]  Culture, Urine     Status: Abnormal (Preliminary result)    Collection Time: 10/01/20  8:47 PM    Specimen: Urine, Clean Catch  Result Value Ref Range    Specimen  Description URINE, CLEAN CATCH      Special Requests NONE      Culture (A)        >=100,000 COLONIES/mL ESCHERICHIA COLI SUSCEPTIBILITIES TO FOLLOW Performed at [facility], [address], [place], [place] [address]      Report Status PENDING    Hemoglobin A1c     Status: Abnormal    Collection Time: 10/02/20  4:54 AM  Result Value Ref Range    Hgb A1c MFr Bld 5.8 (H) 4.8 - 5.6 %      Comment: (NOTE) Pre diabetes:          5.7%-6.4%   Diabetes:              >6.4%   Glycemic control for   <7.0% adults with diabetes      Mean Plasma Glucose 119.76 mg/dL      Comment: Performed at [facility], [address], [place], [place] [address]  TSH     Status: None    Collection Time: 10/02/20  4:54 AM  Result Value Ref Range    TSH 2.076 0.350 - 4.500 uIU/mL      Comment: Performed by a 3rd Generation assay with a functional sensitivity of <=0.01 uIU/mL. Performed at [facility],  [address] [place], [place] [address]    Potassium     Status: None    Collection Time: 10/02/20  7:57 PM  Result Value Ref Range    Potassium 3.5 3.5 - 5.1 mmol/L      Comment: Performed at [facility], [address], [place], [place] [address]  Magnesium     Status: None    Collection Time: 10/02/20  7:57 PM  Result Value Ref Range    Magnesium 1.9 1.7 - 2.4 mg/dL      Comment: Performed at [facility], [address] [place], [place] [address]  Basic metabolic panel     Status: None    Collection Time: 10/03/20  3:53 AM  Result Value Ref Range    Sodium 137 135 - 145 mmol/L    Potassium 3.5 3.5 - 5.1 mmol/L    Chloride 104 98 - 111 mmol/L    CO2 27 22 - 32 mmol/L    Glucose, Bld 93 70 - 99 mg/dL      Comment: Glucose reference range applies only to samples taken after fasting for at least 8 hours.    BUN 17 8 - 23 mg/dL    Creatinine, Ser 6.38 0.44 - 1.00 mg/dL    Calcium 9.3 8.9 - 46.6 mg/dL    GFR, Estimated >59 >93 mL/min      Comment: (NOTE) Calculated using the CKD-EPI Creatinine Equation (2021)      Anion gap 6 5 - 15      Comment: Performed at [facility], [address], [place], [place] [address]  CBC     Status: Abnormal    Collection Time: 10/03/20  3:53 AM  Result Value Ref Range    WBC 5.3 4.0 - 10.5 K/uL    RBC 3.91 3.87 - 5.11 MIL/uL    Hemoglobin 11.3 (L) 12.0 - 15.0 g/dL    HCT 79.3 (L) 90.3 - 46.0 %    MCV 87.0 80.0 - 100.0 fL    MCH 28.9 26.0 - 34.0 pg    MCHC 33.2 30.0 - 36.0 g/dL    RDW 00.9 23.3 - 00.7 %  Platelets 409 (H) 150 - 400 K/uL    nRBC 0.0 0.0 - 0.2 %      Comment: Performed at [facility], [address] [place], [place] [address]       Imaging Results (Last 48 hours)  MR BRAIN WO CONTRAST   Result Date: 10/01/2020 CLINICAL DATA:  Slurred speech and left face droop beginning today. EXAM: MRI HEAD WITHOUT CONTRAST TECHNIQUE: Multiplanar, multiecho pulse sequences of the brain and surrounding  structures were obtained without intravenous contrast. COMPARISON:  CT studies same day. FINDINGS: Brain: Small acute infarction in the left lateral cerebellum. Several punctate acute infarctions at the right frontoparietal vertex affecting the cortex and subcortical white matter. No confluent large vessel infarction. Generalized age related atrophy. Chronic small-vessel ischemic changes of the pons. Numerous old small vessel cerebellar infarctions. Old small vessel infarctions of the thalami. Moderate chronic small-vessel ischemic changes of the white matter. No hydrocephalus, hemorrhage or extra-axial collection. Vascular: Major vessels at the base of the brain show flow. Skull and upper cervical spine: Negative Sinuses/Orbits: Sinuses are clear.  Orbits are negative. Other: Small right mastoid effusion. IMPRESSION: 1. Several small/punctate acute infarctions at the right frontoparietal vertex. 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 This distribution would suggest embolic disease from the heart or ascending aorta. 2. Numerous old small vessel ischemic changes throughout the brain as outlined above. Electronically Signed   By: Paulina FusiMark  Shogry M.D.   On: 10/01/2020 17:32    DG CHEST PORT 1 VIEW   Result Date: 10/01/2020 CLINICAL DATA:  TIA EXAM: PORTABLE CHEST 1 VIEW COMPARISON:  02/12/2012 FINDINGS: Cardiomegaly. No confluent opacities or effusions. No acute bony abnormality. IMPRESSION: Cardiomegaly.  No active disease. Electronically Signed   By: Charlett NoseKevin  Dover M.D.   On: 10/01/2020 17:54    ECHOCARDIOGRAM COMPLETE   Result Date: 10/02/2020    ECHOCARDIOGRAM REPORT   Patient Name:   MRS. Fransico MichaelWILMA N Trillo Date of Exam: 10/02/2020 Medical Rec #:  [id number]          Height:       62.0 in Accession #:    [id number]         Weight:       101.2 lb Date of Birth:  05/26/1927           BSA:          1.431 m Patient Age:    89 years           BP:           123/75 mmHg Patient Gender: F                  HR:            78 bpm. Exam Location:  Inpatient Procedure: Color Doppler and Cardiac Doppler                            MODIFIED REPORT: This report was modified by Dietrich PatesPaula Ross MD on 10/02/2020 due to Complete.  Indications:     Stroke  History:         Patient has no prior history of Echocardiogram examinations.  Sonographer:     Roosvelt Maserachel Lane RDCS Referring Phys:  [id number] [facility] Diagnosing Phys: Dietrich PatesPaula Ross MD IMPRESSIONS  1. LVEF is severely depressed with akinesis of the septum, inferior and apical walls; hypokinesis elsewhere. Left ventricular ejection fraction, by estimation, is 25%. The  left ventricular internal cavity size was moderately dilated. Indeterminate diastolic filling due to E-A fusion.  2. Right ventricular systolic function is normal. The right ventricular size is normal.  3. Left atrial size was mildly dilated.  4. The mitral valve is abnormal. Mild mitral valve regurgitation.  5. The aortic valve is normal in structure. Aortic valve regurgitation is trivial.  6. The inferior vena cava is normal in size with greater than 50% respiratory variability, suggesting right atrial pressure of 3 mmHg. FINDINGS  Left Ventricle: LVEF is severely depressed with akinesis of the septum, inferior and apical walls; hypokinesis elsewhere. Left ventricular ejection fraction, by estimation, is 25%%. The left ventricle demonstrates regional wall motion abnormalities. 3D left ventricular ejection fraction analysis performed but not reported based on interpreter judgement due to suboptimal quality. The left ventricular internal cavity size was moderately dilated. There is no left ventricular hypertrophy. Indeterminate diastolic filling due to E-A fusion. Right Ventricle: The right ventricular size is normal. No increase in right ventricular wall thickness. Right ventricular systolic function is normal. Left Atrium: Left atrial size was mildly dilated. Right Atrium: Right atrial size was normal in size. Pericardium:  Trivial pericardial effusion is present. Mitral Valve: The mitral valve is abnormal. There is mild thickening of the mitral valve leaflet(s). Mild mitral annular calcification. Mild mitral valve regurgitation. Tricuspid Valve: The tricuspid valve is normal in structure. Tricuspid valve regurgitation is mild. Aortic Valve: The aortic valve is normal in structure. Aortic valve regurgitation is trivial. Aortic valve mean gradient measures 6.0 mmHg. Aortic valve peak gradient measures 11.3 mmHg. Aortic valve area, by VTI measures 1.43 cm. Pulmonic Valve: The pulmonic valve was normal in structure. Pulmonic valve regurgitation is trivial. Aorta: The aortic root is normal in size and structure. Venous: The inferior vena cava is normal in size with greater than 50% respiratory variability, suggesting right atrial pressure of 3 mmHg. IAS/Shunts: The interatrial septum was not assessed.  LEFT VENTRICLE PLAX 2D LVIDd:         5.60 cm LVIDs:         4.90 cm LV PW:         0.90 cm LV IVS:        0.90 cm LVOT diam:     1.80 cm  3D Volume EF: LV SV:         41       3D EF:        43 % LV SV Index:   28       LV EDV:       168 ml LVOT Area:     2.54 cm LV ESV:       96 ml                         LV SV:        72 ml RIGHT VENTRICLE          IVC RV Basal diam:  3.60 cm  IVC diam: 1.30 cm LEFT ATRIUM             Index       RIGHT ATRIUM           Index LA diam:        3.60 cm 2.52 cm/m  RA Area:     12.50 cm LA Vol (A2C):   70.1 ml 48.98 ml/m RA Volume:   27.50 ml  19.22 ml/m LA Vol (A4C):   47.7  ml 33.33 ml/m LA Biplane Vol: 57.9 ml 40.46 ml/m  AORTIC VALVE AV Area (Vmax):    1.43 cm AV Area (Vmean):   1.28 cm AV Area (VTI):     1.43 cm AV Vmax:           168.00 cm/s AV Vmean:          112.000 cm/s AV VTI:            0.285 m AV Peak Grad:      11.3 mmHg AV Mean Grad:      6.0 mmHg LVOT Vmax:         94.60 cm/s LVOT Vmean:        56.400 cm/s LVOT VTI:          0.160 m LVOT/AV VTI ratio: 0.56  AORTA Ao Root diam: 2.90 cm Ao  Asc diam:  3.20 cm  SHUNTS Systemic VTI:  0.16 m Systemic Diam: 1.80 cm Dietrich Pates MD Electronically signed by Dietrich Pates MD Signature Date/Time: 10/02/2020/12:23:41 PM    Final (Updated)     CT HEAD CODE STROKE WO CONTRAST   Result Date: 10/01/2020 CLINICAL DATA:  Code stroke. Acute stroke presentation. Deficit not described. EXAM: CT HEAD WITHOUT CONTRAST TECHNIQUE: Contiguous axial images were obtained from the base of the skull through the vertex without intravenous contrast. COMPARISON:  None. FINDINGS: Brain: Generalized age related atrophy. Chronic appearing small vessel ischemic changes of the hemispheric white matter. Old lacunar infarction of the left thalamus. Old small vessel infarctions of the cerebellum. No finding of acute infarction. No mass, hemorrhage, hydrocephalus or extra-axial collection. Vascular: There is atherosclerotic calcification of the major vessels at the base of the brain. Skull: Negative Sinuses/Orbits: Clear/normal Other: None ASPECTS ([place] Stroke Program Early CT Score) - Ganglionic level infarction (caudate, lentiform nuclei, internal capsule, insula, M1-M3 cortex): 7 - Supraganglionic infarction (M4-M6 cortex): 3 Total score (0-10 with 10 being normal): 10 IMPRESSION: 1. No acute finding by CT. Atrophy and chronic small-vessel ischemic changes. 2. ASPECTS is 10. 3. These results were communicated to Dr. Selina Cooley At 1:22 pmon 3/20/2022by text page via the [facility] messaging system. Electronically Signed   By: Paulina Fusi M.D.   On: 10/01/2020 13:22    VAS [place] LOWER EXTREMITY VENOUS (DVT)   Result Date: 10/02/2020  Lower Venous DVT Study Indications: Stroke.  Comparison Study: no prior Performing Technologist: Blanch Media RVS  Examination Guidelines: A complete evaluation includes B-mode imaging, spectral Doppler, color Doppler, and power Doppler as needed of all accessible portions of each vessel. Bilateral testing is considered an integral part of a complete examination.  Limited examinations for reoccurring indications may be performed as noted. The reflux portion of the exam is performed with the patient in reverse Trendelenburg.  +---------+---------------+---------+-----------+----------+--------------+ RIGHT    CompressibilityPhasicitySpontaneityPropertiesThrombus Aging +---------+---------------+---------+-----------+----------+--------------+ CFV      Full           Yes      Yes                                 +---------+---------------+---------+-----------+----------+--------------+ SFJ      Full                                                        +---------+---------------+---------+-----------+----------+--------------+  FV Prox  Full                                                        +---------+---------------+---------+-----------+----------+--------------+ FV Mid   Full                                                        +---------+---------------+---------+-----------+----------+--------------+ FV DistalFull                                                        +---------+---------------+---------+-----------+----------+--------------+ PFV      Full                                                        +---------+---------------+---------+-----------+----------+--------------+ POP      Full           Yes      Yes                                 +---------+---------------+---------+-----------+----------+--------------+ PTV      Full                                                        +---------+---------------+---------+-----------+----------+--------------+ PERO     Full                                                        +---------+---------------+---------+-----------+----------+--------------+   +---------+---------------+---------+-----------+----------+--------------+ LEFT     CompressibilityPhasicitySpontaneityPropertiesThrombus Aging  +---------+---------------+---------+-----------+----------+--------------+ CFV      Full           Yes      Yes                                 +---------+---------------+---------+-----------+----------+--------------+ SFJ      Full                                                        +---------+---------------+---------+-----------+----------+--------------+ FV Prox  Full                                                        +---------+---------------+---------+-----------+----------+--------------+  FV Mid   Full                                                        +---------+---------------+---------+-----------+----------+--------------+ FV DistalFull                                                        +---------+---------------+---------+-----------+----------+--------------+ PFV      Full                                                        +---------+---------------+---------+-----------+----------+--------------+ POP      Full           Yes      Yes                                 +---------+---------------+---------+-----------+----------+--------------+ PTV      Full                                                        +---------+---------------+---------+-----------+----------+--------------+ PERO     Full                                                        +---------+---------------+---------+-----------+----------+--------------+     Summary: BILATERAL: - No evidence of deep vein thrombosis seen in the lower extremities, bilaterally. - No evidence of superficial venous thrombosis in the lower extremities, bilaterally. -No evidence of popliteal cyst, bilaterally.   *See table(s) above for measurements and observations. Electronically signed by Lemar Livings MD on 10/02/2020 at 11:20:48 AM.    Final     CT ANGIO HEAD CODE STROKE   Result Date: 10/01/2020 CLINICAL DATA:  Slurred speech and facial droop. EXAM: CT ANGIOGRAPHY  HEAD AND NECK TECHNIQUE: Multidetector CT imaging of the head and neck was performed using the standard protocol during bolus administration of intravenous contrast. Multiplanar CT image reconstructions and MIPs were obtained to evaluate the vascular anatomy. Carotid stenosis measurements (when applicable) are obtained utilizing NASCET criteria, using the distal internal carotid diameter as the denominator. CONTRAST:  75mL OMNIPAQUE IOHEXOL 350 MG/ML SOLN COMPARISON:  Head CT same day FINDINGS: CTA NECK FINDINGS Aortic arch: Aortic atherosclerosis and ectasia. Branching pattern is normal without origin stenosis. Right carotid system: Common carotid artery widely patent to the carotid bifurcation. Calcified plaque at the bifurcation and ICA bulb but no stenosis. Cervical ICA widely patent beyond that. Left carotid system: Common carotid artery widely patent to the bifurcation. Minimal calcified plaque at the carotid bifurcation. No stenosis. Cervical ICA widely patent. Vertebral arteries: Vertebral artery origins are widely patent. Vertebral  arteries appear normal through the cervical region to the foramen magnum, though detail is somewhat limited due to extensive venous collateral flow adjacent to the vertebral arteries. Skeleton: Ordinary cervical spondylosis and facet osteoarthritis. Other neck: No mass or lymphadenopathy. Previous left thyroidectomy. Multiple nodules within the right lobe of the thyroid, the largest measuring 1.5 cm in diameter. Upper chest: Minimal emphysema in the upper lobes. Apparent occlusion of the innominate vein, possibly due to extrinsic compression between the arterial structures and right clavicular head. This is responsible for the pattern of venous drainage described above. Review of the MIP images confirms the above findings CTA HEAD FINDINGS Anterior circulation: Both internal carotid arteries are patent through the skull base and siphon regions. Ordinary mild siphon  atherosclerotic calcification without stenosis. The anterior and middle cerebral vessels are patent. No large or medium vessel occlusion. No proximal stenosis. No aneurysm or vascular malformation. Fetal origin of the right PCA. Posterior circulation: Both vertebral arteries are patent through the foramen magnum to the basilar. No basilar stenosis. Posterior circulation branch vessels are normal. Venous sinuses: Patent and normal. Anatomic variants: None other significant. Review of the MIP images confirms the above findings IMPRESSION: 1. Aortic atherosclerosis and ectasia. 2. Mild atherosclerotic change at both carotid bifurcations but without stenosis. 3. No intracranial large or medium vessel occlusion or correctable proximal stenosis. 4. Apparent occlusion of the innominate vein, possibly due to extrinsic compression between the arterial structures and right clavicular head. This is responsible for the pattern of venous collateral drainage described in the body of the report. 5. Previous left thyroidectomy. Multiple nodules within the right lobe of the thyroid, the largest measuring 1.4 cm in diameter. Recommend thyroid US (ref: J Am Coll Radiol. 2015 Feb;12(2): 143-50). Aortic Atherosclerosis (ICD10-I70.0) and Emphysema (ICD10-J43.9). Electronically Signed   By: Paulina Fusi M.D.   On: 10/01/2020 15:03    CT ANGIO NECK CODE STROKE   Result Date: 10/01/2020 CLINICAL DATA:  Slurred speech and facial droop. EXAM: CT ANGIOGRAPHY HEAD AND NECK TECHNIQUE: Multidetector CT imaging of the head and neck was performed using the standard protocol during bolus administration of intravenous contrast. Multiplanar CT image reconstructions and MIPs were obtained to evaluate the vascular anatomy. Carotid stenosis measurements (when applicable) are obtained utilizing NASCET criteria, using the distal internal carotid diameter as the denominator. CONTRAST:  75mL OMNIPAQUE IOHEXOL 350 MG/ML SOLN COMPARISON:  Head CT same day  FINDINGS: CTA NECK FINDINGS Aortic arch: Aortic atherosclerosis and ectasia. Branching pattern is normal without origin stenosis. Right carotid system: Common carotid artery widely patent to the carotid bifurcation. Calcified plaque at the bifurcation and ICA bulb but no stenosis. Cervical ICA widely patent beyond that. Left carotid system: Common carotid artery widely patent to the bifurcation. Minimal calcified plaque at the carotid bifurcation. No stenosis. Cervical ICA widely patent. Vertebral arteries: Vertebral artery origins are widely patent. Vertebral arteries appear normal through the cervical region to the foramen magnum, though detail is somewhat limited due to extensive venous collateral flow adjacent to the vertebral arteries. Skeleton: Ordinary cervical spondylosis and facet osteoarthritis. Other neck: No mass or lymphadenopathy. Previous left thyroidectomy. Multiple nodules within the right lobe of the thyroid, the largest measuring 1.5 cm in diameter. Upper chest: Minimal emphysema in the upper lobes. Apparent occlusion of the innominate vein, possibly due to extrinsic compression between the arterial structures and right clavicular head. This is responsible for the pattern of venous drainage described above. Review of the MIP images confirms the above findings CTA  HEAD FINDINGS Anterior circulation: Both internal carotid arteries are patent through the skull base and siphon regions. Ordinary mild siphon atherosclerotic calcification without stenosis. The anterior and middle cerebral vessels are patent. No large or medium vessel occlusion. No proximal stenosis. No aneurysm or vascular malformation. Fetal origin of the right PCA. Posterior circulation: Both vertebral arteries are patent through the foramen magnum to the basilar. No basilar stenosis. Posterior circulation branch vessels are normal. Venous sinuses: Patent and normal. Anatomic variants: None other significant. Review of the MIP images  confirms the above findings IMPRESSION: 1. Aortic atherosclerosis and ectasia. 2. Mild atherosclerotic change at both carotid bifurcations but without stenosis. 3. No intracranial large or medium vessel occlusion or correctable proximal stenosis. 4. Apparent occlusion of the innominate vein, possibly due to extrinsic compression between the arterial structures and right clavicular head. This is responsible for the pattern of venous collateral drainage described in the body of the report. 5. Previous left thyroidectomy. Multiple nodules within the right lobe of the thyroid, the largest measuring 1.4 cm in diameter. Recommend thyroid US (ref: J Am Coll Radiol. 2015 Feb;12(2): 143-50). Aortic Atherosclerosis (ICD10-I70.0) and Emphysema (ICD10-J43.9). Electronically Signed   By: Paulina Fusi M.D.   On: 10/01/2020 15:03             Medical Problem List and Plan: 1.  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  Antithrombotics: -DVT/anticoagulation:  Pharmaceutical:  Eliquis to start in am.              -antiplatelet therapy:  N/A  3. Chronic neck/back painPain Management: --managed with manipulation every 2 weeks.   -denies pain at present 4. Mood: LCSW to follow for evaluation and support.              -antipsychotic agents: N/A 5. Neuropsych: This patient is capable of making decisions on her own behalf. 6. Skin/Wound Care: Routine pressure relief measures.  7. Fluids/Electrolytes/Nutrition: Monitor I/O. Check lytes in am.  8. HTN: Monitor BP tid--avoid hypoperfusion.  -currently normotensive 9. Cardiomyopathy/Asymptomatic episode NSVT: Low dose BB added on 03/22 per Dr. Alveta Heimlich.             --monitor for symptoms with therapy activities.              --conservative management and follow up on outpatient basis.              -check daily weights 10.  Impaired Fasting glucose: Hgb A1C-5.8--stable  11. Gram negative UTI: Treated with dose of fosfomycin on 03/21  -pt states she's asymptomatic at present 12. Anxiety d/o: Used Xanax prn at nights for sleep.        Jacquelynn Cree, PA-C 10/03/2020  I have personally performed a face to face diagnostic evaluation of this patient and formulated the key components of the plan.  Additionally, I have personally reviewed laboratory data, imaging studies, as well as relevant notes and concur with the physician assistant's documentation above.  The patient's status has not changed from the original H&P.  Any changes in documentation from the acute care chart have been noted above.  Ranelle Oyster, MD, [place] [facility]

## 2020-10-03 NOTE — TOC Transition Note (Signed)
CM Transition of Care [facility]) - CM/SW Discharge Note   Patient Details  Name: KAMAILE ZACHOW MRN: [id number] Date of Birth: [birth year]  [facility] of [facility] CM/SW Contact:  Kermit Balo, RN Phone Number: 10/03/2020, 2:02 PM   Clinical Narrative:    Patient is discharging to CIR today. TOC signing off.   Final next level of care: IP Rehab Facility Barriers to Discharge: No Barriers Identified   Patient Goals and CMS Choice     Choice offered to / list presented to : Patient  Discharge Placement                       Discharge Plan and Services                                     Social Determinants of Health (SDOH) Interventions     Readmission Risk Interventions No flowsheet data found.

## 2020-10-03 NOTE — Progress Notes (Signed)
  Speech Language Pathology Treatment: Dysphagia  Patient Details Name: KYNNADI DICENSO MRN: [id number] DOB: [birth year] Today's Date: 10/03/2020 Time: 6967-8938 SLP Time Calculation (min) (ACUTE ONLY): 18 min  Assessment / Plan / Recommendation Clinical Impression  Pt seen with breakfast tray in which frequent throat clearing was noted with thin liquids especially when challenged with consecutive sips of larger volumes of water. Pt denies any changes in her swallowing and feels that it is the same as PTA. Pt is afebrile, her lung sounds are clear, and on previous date her son denied any coughing during mealtimes so recommend continuation of her current diet. SLP will continue to follow to assess her oropharyngeal swallow given these potential signs of dysphagia noted today.    HPI HPI: Patient is a 87 y.o. female with PMH: HTN, HLD, prediabetes and vitamin d deficiency who [place] [facility] after being noticed with slurred speech and left sided facial droop. At baseline, she lives alone and still drives, only takes vitamin d supplement but no other medications. 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      SLP Plan  Continue with current plan of care       Recommendations  Diet recommendations: Regular;Thin liquid Liquids provided via: Cup;Straw Medication Administration: Whole meds with liquid Supervision: Patient able to self feed;Intermittent supervision to cue for compensatory strategies Compensations: Slow rate;Small sips/bites Postural Changes and/or Swallow Maneuvers: Seated upright 90 degrees                Oral Care Recommendations: Oral care BID Follow up Recommendations: Inpatient Rehab SLP Visit Diagnosis: Dysphagia, unspecified (R13.10) Plan: Continue with current plan of care       GO                Zettie Cooley., SLP  Student 10/03/2020, 9:56 AM

## 2020-10-03 NOTE — Progress Notes (Signed)
Inpatient Rehabilitation Admissions Coordinator  I met with patient and her son, [name], at bedside. We discussed goals and expectations of a possible CIR admit as well as her other rehab venue options. They prefer CIR admit. I will contact Dr. Cruzita Lederer, acute team and TOC to make the arrangements to admit today.  Danne Baxter, RN, MSN Rehab Admissions Coordinator [phone] 10/03/2020 11:09 AM

## 2020-10-03 NOTE — Discharge Summary (Addendum)
Physician Discharge Summary  Leslie [name] ZOX:[id number] DOB: 18-Oct-1926 DOA: 10/01/2020  PCP: Leslie Cowboy, MD  Admit date: 10/01/2020 Discharge date: 10/03/2020  Admitted From: home Disposition:  CIR  Recommendations at discharge Start Eliquis 2.5 mg twice daily, stop aspirin and Plavix  Home Health: None Equipment/Devices: None  Discharge Condition: Stable CODE STATUS: DNR Diet recommendation: Heart healthy  HPI: Per admitting MD, Leslie [name] is a 87 y.o. female with medical history significant of hypertension, hyperlipidemia, prediabetes, and vitamin D deficiency who presented from church after being noticed to have slurred speech and left-sided facial droop.  Son is present at bedside and helps provide additional history.  At baseline patient still lives alone and is able to complete most of her ADLs.  She routinely follows with care provider, but does not require medications for blood pressure or cholesterol.  After patient started experiencing symptoms she still is able to ambulate without need of assistance and not any other symptoms or complaints at this time.  Hospital Course / Discharge diagnoses: Principal Problem Acute CVA-neurology consulted, appreciate input.  Discussed with Dr. Roda Shutters.  Underwent work-up for stroke, A1c 5.8, LDL 78.  CT angiogram without large or medium vessel occlusion or correctable proximal stenosis.   She was initially placed on dual antiplatelet therapy with aspirin and Plavix, however upon discovery of her depressed EF, neurology recommends Eliquis.  Discussed with cardiology as well as neurology on the day of discharge, given age patient will be placed on 2.5 mg Eliquis twice daily and aspirin and Plavix will be stopped.  Active Problems Systolic CHF, unknown chronicity -a 2D echo done as part of the stroke work-up showed severely depressed LVEF with akinesis of the septum, inferior and apical walls, hypokinesis elsewhere.  LVEF is 25%.  RV is  normal.  Cardiology has been consulted and followed patient while hospitalized.  She is normotensive and could not be started on Entresto or aggressive regimen in the setting of acute stroke.  She did have NSVT on telemetry and was started on low-dose metoprolol on discharge.  Will need outpatient follow-up.  She is not a candidate for aggressive invasive work-up. Thyroid nodules-we will need to be followed up as an outpatient.  She has a history of left thyroidectomy Prediabetes-dietary measures CKD 3A-creatinine at baseline  Sepsis ruled out   Discharge Instructions   Allergies as of 10/03/2020      Reactions   Adhesive [tape]    Advil [ibuprofen]    Atenolol       Medication List    TAKE these medications   apixaban 2.5 MG Tabs tablet Commonly known as: Eliquis Take 1 tablet (2.5 mg total) by mouth 2 (two) times daily. Start taking on: October 04, 2020   atorvastatin 20 MG tablet Commonly known as: LIPITOR Take 1 tablet (20 mg total) by mouth daily. Start taking on: October 04, 2020   hydrocortisone 2.5 % rectal cream Commonly known as: ANUSOL-HC Place 1 application rectally 2 (two) times daily.   hydrocortisone 25 MG suppository Commonly known as: ANUSOL-HC Place 1 suppository (25 mg total) rectally 2 (two) times daily.   latanoprost 0.005 % ophthalmic solution Commonly known as: XALATAN Place 1 drop into both eyes daily.   metoprolol succinate 25 MG 24 hr tablet Commonly known as: TOPROL-XL Take 1 tablet (25 mg total) by mouth daily. Start taking on: October 04, 2020   SENNA PO Take 1 tablet by mouth daily.   Vitamin D3 125 MCG (5000 UT)  Caps Take 5,000 Units by mouth daily.        Consultations:  Neurology   Cardiology   Procedures/Studies:  MR BRAIN WO CONTRAST  Result Date: 10/01/2020 CLINICAL DATA:  Slurred speech and left face droop beginning today. EXAM: MRI HEAD WITHOUT CONTRAST TECHNIQUE: Multiplanar, multiecho pulse sequences of the brain and  surrounding structures were obtained without intravenous contrast. COMPARISON:  CT studies same day. FINDINGS: Brain: Small acute infarction in the left lateral cerebellum. Several punctate acute infarctions at the right frontoparietal vertex affecting the cortex and subcortical white matter. No confluent large vessel infarction. Generalized age related atrophy. Chronic small-vessel ischemic changes of the pons. Numerous old small vessel cerebellar infarctions. Old small vessel infarctions of the thalami. Moderate chronic small-vessel ischemic changes of the white matter. No hydrocephalus, hemorrhage or extra-axial collection. Vascular: Major vessels at the base of the brain show flow. Skull and upper cervical spine: Negative Sinuses/Orbits: Sinuses are clear.  Orbits are negative. Other: Small right mastoid effusion. IMPRESSION: 1. Several small/punctate acute infarctions at the right frontoparietal vertex. 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 This distribution would suggest embolic disease from the heart or ascending aorta. 2. Numerous old small vessel ischemic changes throughout the brain as outlined above. Electronically Signed   By: Leslie [name] M.D.   On: 10/01/2020 17:32   DG CHEST PORT 1 VIEW  Result Date: 10/01/2020 CLINICAL DATA:  TIA EXAM: PORTABLE CHEST 1 VIEW COMPARISON:  02/12/2012 FINDINGS: Cardiomegaly. No confluent opacities or effusions. No acute bony abnormality. IMPRESSION: Cardiomegaly.  No active disease. Electronically Signed   By: Leslie [name] M.D.   On: 10/01/2020 17:54   ECHOCARDIOGRAM COMPLETE  Result Date: 10/02/2020    ECHOCARDIOGRAM REPORT   Patient Name:   Leslie [name] Date of Exam: 10/02/2020 Medical Rec #:  [id number]          Height:       62.0 in Accession #:    [id number]         Weight:       101.2 lb Date of Birth:  Mar 20, 1927           BSA:          1.431 m Patient Age:    87 years           BP:           123/75 mmHg Patient Gender: F                   HR:           78 bpm. Exam Location:  Inpatient Procedure: Color Doppler and Cardiac Doppler                            MODIFIED REPORT: This report was modified by Leslie Pates MD on 10/02/2020 due to Complete.  Indications:     Stroke  History:         Patient has no prior history of Echocardiogram examinations.  Sonographer:     Roosvelt Maser RDCS Referring Phys:  [id number] [facility] A SMITH Diagnosing Phys: Leslie Pates MD IMPRESSIONS  1. LVEF is severely depressed with akinesis of the septum, inferior and apical walls; hypokinesis elsewhere. Left ventricular ejection fraction, by estimation, is 25%. The left ventricular internal cavity size was moderately dilated. Indeterminate diastolic filling due to E-A fusion.  2. Right ventricular systolic function is normal. The right ventricular size is  normal.  3. Left atrial size was mildly dilated.  4. The mitral valve is abnormal. Mild mitral valve regurgitation.  5. The aortic valve is normal in structure. Aortic valve regurgitation is trivial.  6. The inferior vena cava is normal in size with greater than 50% respiratory variability, suggesting right atrial pressure of 3 mmHg. FINDINGS  Left Ventricle: LVEF is severely depressed with akinesis of the septum, inferior and apical walls; hypokinesis elsewhere. Left ventricular ejection fraction, by estimation, is 25%%. The left ventricle demonstrates regional wall motion abnormalities. 3D left ventricular ejection fraction analysis performed but not reported based on interpreter judgement due to suboptimal quality. The left ventricular internal cavity size was moderately dilated. There is no left ventricular hypertrophy. Indeterminate diastolic filling due to E-A fusion. Right Ventricle: The right ventricular size is normal. No increase in right ventricular wall thickness. Right ventricular systolic function is normal. Left Atrium: Left atrial size was mildly dilated. Right Atrium: Right atrial size was normal in size.  Pericardium: Trivial pericardial effusion is present. Mitral Valve: The mitral valve is abnormal. There is mild thickening of the mitral valve leaflet(s). Mild mitral annular calcification. Mild mitral valve regurgitation. Tricuspid Valve: The tricuspid valve is normal in structure. Tricuspid valve regurgitation is mild. Aortic Valve: The aortic valve is normal in structure. Aortic valve regurgitation is trivial. Aortic valve mean gradient measures 6.0 mmHg. Aortic valve peak gradient measures 11.3 mmHg. Aortic valve area, by VTI measures 1.43 cm. Pulmonic Valve: The pulmonic valve was normal in structure. Pulmonic valve regurgitation is trivial. Aorta: The aortic root is normal in size and structure. Venous: The inferior vena cava is normal in size with greater than 50% respiratory variability, suggesting right atrial pressure of 3 mmHg. IAS/Shunts: The interatrial septum was not assessed.  LEFT VENTRICLE PLAX 2D LVIDd:         5.60 cm LVIDs:         4.90 cm LV PW:         0.90 cm LV IVS:        0.90 cm LVOT diam:     1.80 cm  3D Volume EF: LV SV:         41       3D EF:        43 % LV SV Index:   28       LV EDV:       168 ml LVOT Area:     2.54 cm LV ESV:       96 ml                         LV SV:        72 ml RIGHT VENTRICLE          IVC RV Basal diam:  3.60 cm  IVC diam: 1.30 cm LEFT ATRIUM             Index       RIGHT ATRIUM           Index LA diam:        3.60 cm 2.52 cm/m  RA Area:     12.50 cm LA Vol (A2C):   70.1 ml 48.98 ml/m RA Volume:   27.50 ml  19.22 ml/m LA Vol (A4C):   47.7 ml 33.33 ml/m LA Biplane Vol: 57.9 ml 40.46 ml/m  AORTIC VALVE AV Area (Vmax):    1.43 cm AV Area (Vmean):   1.28 cm  AV Area (VTI):     1.43 cm AV Vmax:           168.00 cm/s AV Vmean:          112.000 cm/s AV VTI:            0.285 m AV Peak Grad:      11.3 mmHg AV Mean Grad:      6.0 mmHg LVOT Vmax:         94.60 cm/s LVOT Vmean:        56.400 cm/s LVOT VTI:          0.160 m LVOT/AV VTI ratio: 0.56  AORTA Ao Root  diam: 2.90 cm Ao Asc diam:  3.20 cm  SHUNTS Systemic VTI:  0.16 m Systemic Diam: 1.80 cm Leslie Pates MD Electronically signed by Leslie Pates MD Signature Date/Time: 10/02/2020/12:23:41 PM    Final (Updated)    CT HEAD CODE STROKE WO CONTRAST  Result Date: 10/01/2020 CLINICAL DATA:  Code stroke. Acute stroke presentation. Deficit not described. EXAM: CT HEAD WITHOUT CONTRAST TECHNIQUE: Contiguous axial images were obtained from the base of the skull through the vertex without intravenous contrast. COMPARISON:  None. FINDINGS: Brain: Generalized age related atrophy. Chronic appearing small vessel ischemic changes of the hemispheric white matter. Old lacunar infarction of the left thalamus. Old small vessel infarctions of the cerebellum. No finding of acute infarction. No mass, hemorrhage, hydrocephalus or extra-axial collection. Vascular: There is atherosclerotic calcification of the major vessels at the base of the brain. Skull: Negative Sinuses/Orbits: Clear/normal Other: None ASPECTS ([place]ber[place] Stroke Program Early CT Score) - Ganglionic level infarction (caudate, lentiform nuclei, internal capsule, insula, M1-M3 cortex): 7 - Supraganglionic infarction (M4-M6 cortex): 3 Total score (0-10 with 10 being normal): 10 IMPRESSION: 1. No acute finding by CT. Atrophy and chronic small-vessel ischemic changes. 2. ASPECTS is 10. 3. These results were communicated to Dr. Selina Cooley At 1:22 pmon 3/20/2022by text page via the [facility] messaging system. Electronically Signed   By: Leslie [name] M.D.   On: 10/01/2020 13:22   VAS [place] LOWER EXTREMITY VENOUS (DVT)  Result Date: 10/02/2020  Lower Venous DVT Study Indications: Stroke.  Comparison Study: no prior Performing Technologist: Blanch Media RVS  Examination Guidelines: A complete evaluation includes B-mode imaging, spectral Doppler, color Doppler, and power Doppler as needed of all accessible portions of each vessel. Bilateral testing is considered an integral part of a complete  examination. Limited examinations for reoccurring indications may be performed as noted. The reflux portion of the exam is performed with the patient in reverse Trendelenburg.  +---------+---------------+---------+-----------+----------+--------------+ RIGHT    CompressibilityPhasicitySpontaneityPropertiesThrombus Aging +---------+---------------+---------+-----------+----------+--------------+ CFV      Full           Yes      Yes                                 +---------+---------------+---------+-----------+----------+--------------+ SFJ      Full                                                        +---------+---------------+---------+-----------+----------+--------------+ FV Prox  Full                                                        +---------+---------------+---------+-----------+----------+--------------+  FV Mid   Full                                                        +---------+---------------+---------+-----------+----------+--------------+ FV DistalFull                                                        +---------+---------------+---------+-----------+----------+--------------+ PFV      Full                                                        +---------+---------------+---------+-----------+----------+--------------+ POP      Full           Yes      Yes                                 +---------+---------------+---------+-----------+----------+--------------+ PTV      Full                                                        +---------+---------------+---------+-----------+----------+--------------+ PERO     Full                                                        +---------+---------------+---------+-----------+----------+--------------+   +---------+---------------+---------+-----------+----------+--------------+ LEFT     CompressibilityPhasicitySpontaneityPropertiesThrombus Aging  +---------+---------------+---------+-----------+----------+--------------+ CFV      Full           Yes      Yes                                 +---------+---------------+---------+-----------+----------+--------------+ SFJ      Full                                                        +---------+---------------+---------+-----------+----------+--------------+ FV Prox  Full                                                        +---------+---------------+---------+-----------+----------+--------------+ FV Mid   Full                                                        +---------+---------------+---------+-----------+----------+--------------+   FV DistalFull                                                        +---------+---------------+---------+-----------+----------+--------------+ PFV      Full                                                        +---------+---------------+---------+-----------+----------+--------------+ POP      Full           Yes      Yes                                 +---------+---------------+---------+-----------+----------+--------------+ PTV      Full                                                        +---------+---------------+---------+-----------+----------+--------------+ PERO     Full                                                        +---------+---------------+---------+-----------+----------+--------------+     Summary: BILATERAL: - No evidence of deep vein thrombosis seen in the lower extremities, bilaterally. - No evidence of superficial venous thrombosis in the lower extremities, bilaterally. -No evidence of popliteal cyst, bilaterally.   *See table(s) above for measurements and observations. Electronically signed by Lemar Livings MD on 10/02/2020 at 11:20:48 AM.    Final    CT ANGIO HEAD CODE STROKE  Result Date: 10/01/2020 CLINICAL DATA:  Slurred speech and facial droop. EXAM: CT ANGIOGRAPHY HEAD  AND NECK TECHNIQUE: Multidetector CT imaging of the head and neck was performed using the standard protocol during bolus administration of intravenous contrast. Multiplanar CT image reconstructions and MIPs were obtained to evaluate the vascular anatomy. Carotid stenosis measurements (when applicable) are obtained utilizing NASCET criteria, using the distal internal carotid diameter as the denominator. CONTRAST:  75mL OMNIPAQUE IOHEXOL 350 MG/ML SOLN COMPARISON:  Head CT same day FINDINGS: CTA NECK FINDINGS Aortic arch: Aortic atherosclerosis and ectasia. Branching pattern is normal without origin stenosis. Right carotid system: Common carotid artery widely patent to the carotid bifurcation. Calcified plaque at the bifurcation and ICA bulb but no stenosis. Cervical ICA widely patent beyond that. Left carotid system: Common carotid artery widely patent to the bifurcation. Minimal calcified plaque at the carotid bifurcation. No stenosis. Cervical ICA widely patent. Vertebral arteries: Vertebral artery origins are widely patent. Vertebral arteries appear normal through the cervical region to the foramen magnum, though detail is somewhat limited due to extensive venous collateral flow adjacent to the vertebral arteries. Skeleton: Ordinary cervical spondylosis and facet osteoarthritis. Other neck: No mass or lymphadenopathy. Previous left thyroidectomy. Multiple nodules within the right lobe of the thyroid, the largest measuring 1.5 cm in diameter. Upper chest: Minimal emphysema  in the upper lobes. Apparent occlusion of the innominate vein, possibly due to extrinsic compression between the arterial structures and right clavicular head. This is responsible for the pattern of venous drainage described above. Review of the MIP images confirms the above findings CTA HEAD FINDINGS Anterior circulation: Both internal carotid arteries are patent through the skull base and siphon regions. Ordinary mild siphon atherosclerotic  calcification without stenosis. The anterior and middle cerebral vessels are patent. No large or medium vessel occlusion. No proximal stenosis. No aneurysm or vascular malformation. Fetal origin of the right PCA. Posterior circulation: Both vertebral arteries are patent through the foramen magnum to the basilar. No basilar stenosis. Posterior circulation branch vessels are normal. Venous sinuses: Patent and normal. Anatomic variants: None other significant. Review of the MIP images confirms the above findings IMPRESSION: 1. Aortic atherosclerosis and ectasia. 2. Mild atherosclerotic change at both carotid bifurcations but without stenosis. 3. No intracranial large or medium vessel occlusion or correctable proximal stenosis. 4. Apparent occlusion of the innominate vein, possibly due to extrinsic compression between the arterial structures and right clavicular head. This is responsible for the pattern of venous collateral drainage described in the body of the report. 5. Previous left thyroidectomy. Multiple nodules within the right lobe of the thyroid, the largest measuring 1.4 cm in diameter. Recommend thyroid US (ref: J Am Coll Radiol. 2015 Feb;12(2): 143-50). Aortic Atherosclerosis (ICD10-I70.0) and Emphysema (ICD10-J43.9). Electronically Signed   By: Leslie [name] M.D.   On: 10/01/2020 15:03   CT ANGIO NECK CODE STROKE  Result Date: 10/01/2020 CLINICAL DATA:  Slurred speech and facial droop. EXAM: CT ANGIOGRAPHY HEAD AND NECK TECHNIQUE: Multidetector CT imaging of the head and neck was performed using the standard protocol during bolus administration of intravenous contrast. Multiplanar CT image reconstructions and MIPs were obtained to evaluate the vascular anatomy. Carotid stenosis measurements (when applicable) are obtained utilizing NASCET criteria, using the distal internal carotid diameter as the denominator. CONTRAST:  75mL OMNIPAQUE IOHEXOL 350 MG/ML SOLN COMPARISON:  Head CT same day FINDINGS: CTA NECK  FINDINGS Aortic arch: Aortic atherosclerosis and ectasia. Branching pattern is normal without origin stenosis. Right carotid system: Common carotid artery widely patent to the carotid bifurcation. Calcified plaque at the bifurcation and ICA bulb but no stenosis. Cervical ICA widely patent beyond that. Left carotid system: Common carotid artery widely patent to the bifurcation. Minimal calcified plaque at the carotid bifurcation. No stenosis. Cervical ICA widely patent. Vertebral arteries: Vertebral artery origins are widely patent. Vertebral arteries appear normal through the cervical region to the foramen magnum, though detail is somewhat limited due to extensive venous collateral flow adjacent to the vertebral arteries. Skeleton: Ordinary cervical spondylosis and facet osteoarthritis. Other neck: No mass or lymphadenopathy. Previous left thyroidectomy. Multiple nodules within the right lobe of the thyroid, the largest measuring 1.5 cm in diameter. Upper chest: Minimal emphysema in the upper lobes. Apparent occlusion of the innominate vein, possibly due to extrinsic compression between the arterial structures and right clavicular head. This is responsible for the pattern of venous drainage described above. Review of the MIP images confirms the above findings CTA HEAD FINDINGS Anterior circulation: Both internal carotid arteries are patent through the skull base and siphon regions. Ordinary mild siphon atherosclerotic calcification without stenosis. The anterior and middle cerebral vessels are patent. No large or medium vessel occlusion. No proximal stenosis. No aneurysm or vascular malformation. Fetal origin of the right PCA. Posterior circulation: Both vertebral arteries are patent through the foramen magnum to the  basilar. No basilar stenosis. Posterior circulation branch vessels are normal. Venous sinuses: Patent and normal. Anatomic variants: None other significant. Review of the MIP images confirms the above  findings IMPRESSION: 1. Aortic atherosclerosis and ectasia. 2. Mild atherosclerotic change at both carotid bifurcations but without stenosis. 3. No intracranial large or medium vessel occlusion or correctable proximal stenosis. 4. Apparent occlusion of the innominate vein, possibly due to extrinsic compression between the arterial structures and right clavicular head. This is responsible for the pattern of venous collateral drainage described in the body of the report. 5. Previous left thyroidectomy. Multiple nodules within the right lobe of the thyroid, the largest measuring 1.4 cm in diameter. Recommend thyroid US (ref: J Am Coll Radiol. 2015 Feb;12(2): 143-50). Aortic Atherosclerosis (ICD10-I70.0) and Emphysema (ICD10-J43.9). Electronically Signed   By: Leslie [name] M.D.   On: 10/01/2020 15:03      Subjective: - no chest pain, shortness of breath, no abdominal pain, nausea or vomiting.   Discharge Exam: BP 121/83 (BP Location: Right Arm)   Pulse 75   Temp 98.1 F (36.7 C) (Oral)   Resp 15   Ht  (1.575 m)   Wt 45.9 kg   SpO2 97%   BMI 18.51 kg/m   General: Pt is alert, awake, not in acute distress Cardiovascular: RRR, S1/S2 +, no rubs, no gallops Respiratory: CTA bilaterally, no wheezing, no rhonchi Abdominal: Soft, NT, ND, bowel sounds + Extremities: no edema, no cyanosis   The results of significant diagnostics from this hospitalization (including imaging, microbiology, ancillary and laboratory) are listed below for reference.     Microbiology: Recent Results (from the past 240 hour(s))  Resp Panel by RT-PCR (Flu A&B, Covid) Nasopharyngeal Swab     Status: None   Collection Time: 10/01/20  5:54 PM   Specimen: Nasopharyngeal Swab; Nasopharyngeal(NP) swabs in vial transport medium  Result Value Ref Range Status   SARS Coronavirus 2 by RT PCR NEGATIVE NEGATIVE Final    Comment: (NOTE) SARS-CoV-2 target nucleic acids are NOT DETECTED.  The SARS-CoV-2 RNA is generally  detectable in upper respiratory specimens during the acute phase of infection. The lowest concentration of SARS-CoV-2 viral copies this assay can detect is 138 copies/mL. A negative result does not preclude SARS-Cov-2 infection and should not be used as the sole basis for treatment or other patient management decisions. A negative result may occur with  improper specimen collection/handling, submission of specimen other than nasopharyngeal swab, presence of viral mutation(s) within the areas targeted by this assay, and inadequate number of viral copies(<138 copies/mL). A negative result must be combined with clinical observations, patient history, and epidemiological information. The expected result is Negative.  Fact Sheet for Patients:  BloggerCourse.com  Fact Sheet for Healthcare Providers:  SeriousBroker.it  This test is no t yet approved or cleared by the [place] FDA and  has been authorized for detection and/or diagnosis of SARS-CoV-2 by FDA under an Emergency Use Authorization (EUA). This EUA will remain  in effect (meaning this test can be used) for the duration of the COVID-19 declaration under Section 564(b)(1) of the Act, 21 U.S.C.section 360bbb-3(b)(1), unless the authorization is terminated  or revoked sooner.       Influenza A by PCR NEGATIVE NEGATIVE Final   Influenza B by PCR NEGATIVE NEGATIVE Final    Comment: (NOTE) The Xpert Xpress SARS-CoV-2/FLU/RSV plus assay is intended as an aid in the diagnosis of influenza from Nasopharyngeal swab specimens and should not be used as a sole  basis for treatment. Nasal washings and aspirates are unacceptable for Xpert Xpress SARS-CoV-2/FLU/RSV testing.  Fact Sheet for Patients: BloggerCourse.com  Fact Sheet for Healthcare Providers: SeriousBroker.it  This test is not yet approved or cleared by the [place] FDA  and has been authorized for detection and/or diagnosis of SARS-CoV-2 by FDA under an Emergency Use Authorization (EUA). This EUA will remain in effect (meaning this test can be used) for the duration of the COVID-19 declaration under Section 564(b)(1) of the Act, 21 U.S.C. section 360bbb-3(b)(1), unless the authorization is terminated or revoked.  Performed at [facility], [address], [place], [place] [address]   Culture, Urine     Status: Abnormal (Preliminary result)   Collection Time: 10/01/20  8:47 PM   Specimen: Urine, Clean Catch  Result Value Ref Range Status   Specimen Description URINE, CLEAN CATCH  Final   Special Requests NONE  Final   Culture (A)  Final    >=100,000 COLONIES/mL ESCHERICHIA COLI SUSCEPTIBILITIES TO FOLLOW Performed at [facility], [address], [place], [place] [address]    Report Status PENDING  Incomplete     Labs: Basic Metabolic Panel: Recent Labs  Lab 10/01/20 1313 10/01/20 1320 10/02/20 1957 10/03/20 0353  NA 132* 138  --  137  K 3.8 3.9 3.5 3.5  CL 100 102  --  104  CO2 25  --   --  27  GLUCOSE 108* 107*  --  93  BUN 19 21  --  17  CREATININE 0.88 0.80  --  0.84  CALCIUM 9.5  --   --  9.3  MG  --   --  1.9  --    Liver Function Tests: Recent Labs  Lab 10/01/20 1313  AST 17  ALT 15  ALKPHOS 60  BILITOT 1.3*  PROT 6.6  ALBUMIN 3.1*   CBC: Recent Labs  Lab 10/01/20 1313 10/01/20 1320 10/03/20 0353  WBC 8.9  --  5.3  NEUTROABS 7.0  --   --   HGB 12.3 12.9 11.3*  HCT 37.2 38.0 34.0*  MCV 88.2  --  87.0  PLT 444*  --  409*   CBG: Recent Labs  Lab 10/01/20 1424 10/01/20 1853  GLUCAP 87 105*   Hgb A1c Recent Labs    10/02/20 0454  HGBA1C 5.8*   Lipid Profile Recent Labs    10/01/20 1801  CHOL 135  HDL 47  LDLCALC 78  TRIG 51  CHOLHDL 2.9   Thyroid function studies Recent Labs    10/02/20 0454  TSH 2.076   Urinalysis    Component Value Date/Time   COLORURINE YELLOW  10/01/2020 2040   APPEARANCEUR HAZY (A) 10/01/2020 2040   LABSPEC >1.046 (H) 10/01/2020 2040   PHURINE 6.0 10/01/2020 2040   GLUCOSEU NEGATIVE 10/01/2020 2040   HGBUR SMALL (A) 10/01/2020 2040   BILIRUBINUR NEGATIVE 10/01/2020 2040   KETONESUR NEGATIVE 10/01/2020 2040   PROTEINUR NEGATIVE 10/01/2020 2040   NITRITE POSITIVE (A) 10/01/2020 2040   LEUKOCYTESUR LARGE (A) 10/01/2020 2040    FURTHER DISCHARGE INSTRUCTIONS:   Get Medicines reviewed and adjusted: Please take all your medications with you for your next visit with your Primary MD   Laboratory/radiological data: Please request your Primary MD to go over all hospital tests and procedure/radiological results at the follow up, please ask your Primary MD to get all Hospital records sent to his/her office.   In some cases, they will be blood work,  cultures and biopsy results pending at the time of your discharge. Please request that your primary care M.D. goes through all the records of your hospital data and follows up on these results.   Also Note the following: If you experience worsening of your admission symptoms, develop shortness of breath, life threatening emergency, suicidal or homicidal thoughts you must seek medical attention immediately by calling 911 or calling your MD immediately  if symptoms less severe.   You must read complete instructions/literature along with all the possible adverse reactions/side effects for all the Medicines you take and that have been prescribed to you. Take any new Medicines after you have completely understood and accpet all the possible adverse reactions/side effects.    Do not drive when taking Pain medications or sleeping medications (Benzodaizepines)   Do not take more than prescribed Pain, Sleep and Anxiety Medications. It is not advisable to combine anxiety,sleep and pain medications without talking with your primary care practitioner   Special Instructions: If you have smoked or chewed  Tobacco  in the last 2 yrs please stop smoking, stop any regular Alcohol  and or any Recreational drug use.   Wear Seat belts while driving.   Please note: You were cared for by a hospitalist during your hospital stay. Once you are discharged, your primary care physician will handle any further medical issues. Please note that NO REFILLS for any discharge medications will be authorized once you are discharged, as it is imperative that you return to your primary care physician (or establish a relationship with a primary care physician if you do not have one) for your post hospital discharge needs so that they can reassess your need for medications and monitor your lab values.  Time coordinating discharge: 40 minutes  SIGNED:  Pamella Pert, MD, PhD 10/03/2020, 1:29 PM

## 2020-10-03 NOTE — Progress Notes (Signed)
[name], MD  Physician  Physical Medicine and Rehabilitation  PMR Pre-admission      Signed  Date of Service:  10/03/2020 11:52 AM      Related encounter: ED to Hosp-Admission (Discharged) from 10/01/2020 in [facility]       Signed          Show:Clear all $RemoveBefore'[x]'OmqBnvEugQWum$ Manual$R'[x]'tw$ Templa'[x]'$ Copied  Added by: $RemoveB'[x]'bugyMKUI$ Leslie Gong, RN$RemoveBeforeDE'[x]'ewIHsGAHGNLRJib$ [name], MD   '[]'$ Hover for details  PMR Admission Coordinator Pre-Admission Assessment   Patient: Leslie [name] is an 87 y.o., female MRN: [id number] DOB: Mar 14, 1927 Height: $RemoveBefo'5\' 2"'JbIaPtxVgNA$  (157.5 cm) Weight: 45.9 kg   Insurance Information HMO:     PPO:      PCP:      IPA:      80/20:      OTHER:  PRIMARY: Medicare a and b      Policy#: [id number]      Subscriber: pt Benefits:  Phone #: online     Name: 3/22 Eff. Date: 07/15/1993     Deduct: $1556      Out of Pocket Max: none      Life Max: none CIR: 100%      SNF: 20 full days Outpatient: 80%     Co-Pay: 20% Home Health: 100%      Co-Pay: none DME: 80%     Co-Pay: 20% Providers: pt choice  SECONDARY: Combined      Policy#: [id number]   Financial Counselor:       Phone#:    The "Data Collection Information Summary" for patients in Inpatient Rehabilitation Facilities with attached "Privacy Act [place] Records" was provided and verbally reviewed with: Patient   Emergency Contact Information         Contact Information     Name Relation Home Work [address]    Leslie [name] Son     [phone]    Leslie [name]     [phone]         Current Medical History  Patient Admitting Diagnosis: CVA   History of Present Illness: 87 y.o. RH-female with history of glaucoma, T2DM, Vitamin D deficiency, chronic back/neck pain (gets manipulation twice a month) who was admitted on 10/01/20 with slurred speech and facial droop after church service.   CTA head/neck showed apparent occlusion of innominate vein possibly due to extensive compression and  multiple right thyroid nodules.  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  2D echo showed severely depressed LVEF with EF 25% and akinesis of septum, inferior and apical wall and hypokinesis elsewhere. BLE Dopplers were negative for DVT.  Neurology recommended DOAC give embolic stroke in setting of low EF with follow up TTE in 3 months and transition to ASA once EF>35%. Cardiology consulted for input on CM and recommended holding off any goal directed therapy to allow for adequate perfusion and follow up on outpatient basis. She was treated with fosfomycin due to evidence of UTI. Therapy evaluations done revealing dizziness with head turns as well as cognitive deficits with poor safety awareness and decreased posture.    Complete NIHSS TOTAL: 1   Patient's medical record  has been reviewed by the rehabilitation admission coordinator and physician.   Past Medical History      Past Medical History:  Diagnosis Date  . Anxiety disorder    . Glaucoma    . HTN (  hypertension)    . Hyperglycemia    . Hyperlipidemia    . Vitamin D deficiency        Family History   family history includes Diabetes in her father; Heart attack in her brother, brother, and father; Heart disease in her brother and brother; Hypertension in her mother; Stroke in her mother.   Prior Rehab/Hospitalizations Has the patient had prior rehab or hospitalizations prior to admission? Yes   Has the patient had major surgery during 100 days prior to admission? No               Current Medications   Current Facility-Administered Medications:  .  acetaminophen (TYLENOL) tablet 650 mg, 650 mg, Oral, Q4H PRN, 650 mg at 10/03/20 0320 **OR** acetaminophen (TYLENOL) 160 MG/5ML solution 650 mg, 650 mg, Per Tube, Q4H PRN **OR** acetaminophen (TYLENOL) suppository 650 mg, 650 mg, Rectal, Q4H  PRN, Smith, Rondell A, MD .  aspirin EC tablet 81 mg, 81 mg, Oral, Daily, Tamala Julian, Rondell A, MD, 81 mg at 10/03/20 0953 .  atorvastatin (LIPITOR) tablet 20 mg, 20 mg, Oral, Daily, Rosalin Hawking, MD, 20 mg at 10/03/20 0953 .  clopidogrel (PLAVIX) tablet 75 mg, 75 mg, Oral, Daily, Rosalin Hawking, MD, 75 mg at 10/03/20 0953 .  enoxaparin (LOVENOX) injection 40 mg, 40 mg, Subcutaneous, Q24H, Smith, Rondell A, MD, 40 mg at 10/02/20 1725 .  hydrALAZINE (APRESOLINE) injection 10 mg, 10 mg, Intravenous, Q4H PRN, Smith, Rondell A, MD .  latanoprost (XALATAN) 0.005 % ophthalmic solution 1 drop, 1 drop, Both Eyes, QHS, Smith, Rondell A, MD, 1 drop at 10/02/20 2109 .  LORazepam (ATIVAN) injection 0.5 mg, 0.5 mg, Intravenous, Once PRN, Tamala Julian, Rondell A, MD .  metoprolol succinate (TOPROL-XL) 24 hr tablet 25 mg, 25 mg, Oral, Daily, Jerline Pain, MD, 25 mg at 10/03/20 0959 .  senna-docusate (Senokot-S) tablet 1 tablet, 1 tablet, Oral, QHS PRN, Tamala Julian, Rondell A, MD .  traZODone (DESYREL) tablet 25 mg, 25 mg, Oral, QHS PRN, Mansy, Arvella Merles, MD   Patients Current Diet:     Diet Order                      Diet Heart Room service appropriate? Yes; Fluid consistency: Thin  Diet effective now                      Precautions / Restrictions Precautions Precautions: Fall Restrictions Weight Bearing Restrictions: No    Has the patient had 2 or more falls or a fall with injury in the past year? No   Prior Activity Level Community (5-7x/wk): Independent without AD; drives, gets out daily in the community   Prior Functional Level Self Care: Did the patient need help bathing, dressing, using the toilet or eating? Independent   Indoor Mobility: Did the patient need assistance with walking from room to room (with or without device)? Independent   Stairs: Did the patient need assistance with internal or external stairs (with or without device)? Independent   Functional Cognition: Did the patient need help  planning regular tasks such as shopping or remembering to take medications? Independent   Home Assistive Devices / Equipment Home Equipment: Shower seat,Walker - 2 wheels,Grab bars - toilet   Prior Device Use: Indicate devices/aids used by the patient prior to current illness, exacerbation or injury? None of the above   Current Functional Level Cognition   Arousal/Alertness: Awake/alert Overall Cognitive Status: Impaired/Different from baseline Orientation Level:  Oriented to person,Oriented to place,Oriented to time,Disoriented to situation Safety/Judgement: Decreased awareness of deficits,Decreased awareness of safety General Comments: some slow processing and decreased problem sovling, decreased memory but able to self correct month (originally voicing january) Attention: Sustained Sustained Attention: Appears intact Memory: Impaired Memory Impairment: Decreased recall of new information Awareness: Impaired Awareness Impairment: Intellectual impairment Problem Solving: Appears intact (intact at basic level) Safety/Judgment: Appears intact    Extremity Assessment (includes Sensation/Coordination)   Upper Extremity Assessment: Defer to OT evaluation LUE Deficits / Details: mild dysmetria (fine and gross motor), decreased sesnsation distally LUE Sensation: decreased light touch LUE Coordination: decreased fine motor,decreased gross motor  Lower Extremity Assessment: Generalized weakness     ADLs   Overall ADL's : Needs assistance/impaired Grooming: Min guard,Standing Upper Body Bathing: Set up,Sitting Lower Body Bathing: Min guard,Sit to/from stand Upper Body Dressing : Set up,Sitting Lower Body Dressing: Minimal assistance,Sit to/from stand Toilet Transfer: Min guard,Ambulation,Grab bars,Regular Toilet Toileting- Clothing Manipulation and Hygiene: Minimal assistance,Sit to/from stand Toileting - Clothing Manipulation Details (indicate cue type and reason): clothing  mgmt Functional mobility during ADLs: Min guard General ADL Comments: patient limited by generalized weakness, balance     Mobility   Overal bed mobility: Needs Assistance Bed Mobility: Supine to Sit Supine to sit: Supervision,HOB elevated General bed mobility comments: Increased time to complete. Noted increased processing time to figure out how to get feet out from under covers. Arms tangled in lines under the blankets. Gross supervision overall.     Transfers   Overall transfer level: Needs assistance Equipment used: 1 person hand held assist Transfers: Sit to/from Stand Sit to Stand: Min assist General transfer comment: Assist required to gain/maintain standing balance EOB.     Ambulation / Gait / Stairs / Wheelchair Mobility   Ambulation/Gait Ambulation/Gait assistance: Herbalist (Feet): 175 Feet Assistive device: 1 person hand held assist Gait Pattern/deviations: Step-through pattern,Decreased stride length,Drifts right/left,Trunk flexed General Gait Details: Pt reporting she is off balance because she is walking in hospital socks instead of shoes. With head turns, pt reports dizziness and balance deficits more prominent. Min assist throughout gait training at gait belt. Gait velocity: Decreased Gait velocity interpretation: <1.8 ft/sec, indicate of risk for recurrent falls     Posture / Balance Balance Overall balance assessment: Needs assistance Sitting-balance support: No upper extremity supported,Feet supported Sitting balance-Leahy Scale: Good Standing balance support: No upper extremity supported,During functional activity Standing balance-Leahy Scale: Fair Standing balance comment: min guard for safety/balance     Special needs/care consideration      Previous Home Environment  Living Arrangements: Alone  Lives With: Alone Available Help at Discharge: Family,Neighbor,Friend(s),Available PRN/intermittently Type of Home: House Home Layout: Two  level,Able to live on main level with bedroom/bathroom Alternate Level Stairs-Number of Steps: flight Home Access: Stairs to enter Entrance Stairs-Rails: Right Entrance Stairs-Number of Steps: 2 Bathroom Shower/Tub: Ambulance person: Handicapped height Bathroom Accessibility: Yes How Accessible: Accessible via walker Home Care Services: No Additional Comments: reports usually takes baths in the tub   Discharge Living Setting Plans for Discharge Living Setting: Patient's home,Alone,House Type of Home at Discharge: House Discharge Home Layout: Two level,Able to live on main level with bedroom/bathroom Alternate Level Stairs-Number of Steps: flight Discharge Home Access: Stairs to enter Entrance Stairs-Rails: Right Entrance Stairs-Number of Steps: 2 Discharge Bathroom Shower/Tub: Tub/shower unit,Walk-in shower Discharge Bathroom Toilet: Handicapped height Discharge Bathroom Accessibility: Yes How Accessible: Accessible via walker Does the patient have any problems obtaining your medications?:  No   Social/Family/Support Systems Contact Information: [name], Son Anticipated Caregiver: [name] and friends Anticipated Ambulance person Information: see above Ability/Limitations of Caregiver: [name] retired and lives in [place] and can assist. Pt very independent Caregiver Availability: Intermittent Discharge Plan Discussed with Primary Caregiver: Yes Is Caregiver In Agreement with Plan?: Yes Does Caregiver/Family have Issues with Lodging/Transportation while Pt is in Rehab?: No   Goals Patient/Family Goal for Rehab: Mod I to supervision with PT, OT and SLP Expected length of stay: ELOS 7 to 10 days Pt/Family Agrees to Admission and willing to participate: Yes Program Orientation Provided & Reviewed with Pt/Caregiver Including Roles  & Responsibilities: Yes   Decrease burden of Care through IP rehab admission: n/a   Possible need for SNF placement upon  discharge: not anticipated   Patient Condition: I have reviewed [facility] , spoken with CM, and patient and son. I met with patient at the bedside for inpatient rehabilitation assessment.  Patient will benefit from ongoing PT, OT and SLP, can actively participate in 3 hours of therapy a day 5 days of the week, and can make measurable gains during the admission.  Patient will also benefit from the coordinated team approach during an Inpatient Acute Rehabilitation admission.  The patient will receive intensive therapy as well as Rehabilitation physician, nursing, social worker, and care management interventions.  Due to bladder management, bowel management, safety, skin/wound care, disease management, medication administration, pain management and patient education the patient requires 24 hour a day rehabilitation nursing.  The patient is currently min assist overall with mobility and basic ADLs.  Discharge setting and therapy post discharge at home with home health is anticipated.  Patient has agreed to participate in the Acute Inpatient Rehabilitation Program and will admit today.   Preadmission Screen Completed By:  Cleatrice Burke, 10/03/2020 11:52 AM ______________________________________________________________________   Discussed status with Dr. Naaman Plummer  on  10/03/2020 at  1158 and received approval for admission today.   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 Does the need for close, 24 hr/day Medical supervision in concert with the patient's rehab needs make it unreasonable for this patient to be served in a less intensive setting? Yes 2. Co-Morbidities requiring supervision/potential complications: DM2, chronic LBP, UTI 3. Due to bladder management, bowel management, safety, skin/wound care, disease management,  medication administration, pain management and patient education, does the patient require 24 hr/day rehab nursing? Yes 4. Does the patient require coordinated care of a physician, rehab nurse, PT, OT, and SLP to address physical and functional deficits in the context of the above medical diagnosis(es)? Yes Addressing deficits in the following areas: balance, endurance, locomotion, strength, transferring, bowel/bladder control, bathing, dressing, feeding, grooming, toileting, cognition, speech, swallowing and psychosocial support 5. Can the patient actively participate in an intensive therapy program of at least 3 hrs of therapy 5 days a week? Yes 6. The potential for patient to make measurable gains while on inpatient rehab is excellent 7. Anticipated functional outcomes upon discharge from inpatient rehab: modified independent and supervision PT, modified independent and supervision OT, modified independent and supervision SLP 8. Estimated rehab length of stay to reach the above functional goals is: 7-10 days 9. Anticipated discharge destination: Home 10. Overall Rehab/Functional Prognosis: excellent     MD Signature: [name], MD, [facility] 10/03/2020  Revision History                        Note Details  Author [name], MD File Time 10/03/2020 12:17 PM  Author Type Physician Status Signed  Last Editor [name], MD Service Physical Medicine and [place] # [id number] Admit Date 10/03/2020

## 2020-10-03 NOTE — H&P (Signed)
Physical Medicine and Rehabilitation Admission H&P    Chief Complaint  Patient presents with  . Functional  deficits due to Stroke    HPI: Leslie [name] is a 87 year old female with history of glaucoma, anxiety d/o, Vitamin D deficiency, chronic neck/back pain who was admitted on 10/01/2020 with slurred speech and facial droop after attending a church service.  CTA head/neck showed apparent occlusion of the innominate vein possibly due to extensive compression and multiple right thyroid nodules.  MRI brain showed several small/punctate infarcts in right frontoparietal vertex and small acute infarct left lateral cerebellum suggestive of embolic disease from heart or ascending aorta.  2D echo showed severely depressed LVEF with EF of 25% with akinesis of septum, inferior and apical wall and hypokinesis elsewhere.  BLE Dopplers were negative for DVT.  She was found to have evidence of UTI and was treated with fosfomycin. UCS ordered and showed >100,000 gram negative rods and pending.    Cardiology was consulted for input on cardiomyopathy and recommended holding off on any goal-directed therapy however she did have episode of 17 beats asymptomatic NSVT last p.m. and "very low dose metoprolol" added per per Dr. Anne Fu. Dr. Roda Shutters recommended DOAC given embolic stroke in setting of low EF with follow-up TTE in 3 months and transition to ASA once EF greater than 35%.   Therapy evaluations completed revealing balance deficits with awareness of deficits.  CIR recommended due to functional decline.  Review of Systems  Constitutional: Negative for chills and fever.  HENT: Positive for hearing loss. Negative for ear pain.   Eyes: Negative for blurred vision and double vision.  Respiratory: Negative for cough.   Cardiovascular: Negative for chest pain and palpitations.  Gastrointestinal: Positive for constipation. Negative for abdominal pain, nausea and vomiting.  Genitourinary: Negative for dysuria and  urgency.  Musculoskeletal: Negative for myalgias and neck pain.  Skin: Negative for itching.  Neurological: Positive for speech change, focal weakness and weakness. Negative for dizziness.  Endo/Heme/Allergies: Does not bruise/bleed easily.  Psychiatric/Behavioral: Negative for depression and suicidal ideas.      Past Medical History:  Diagnosis Date  . Anxiety disorder   . Glaucoma   . Hearing loss   . HTN (hypertension)   . Hyperglycemia   . Hyperlipidemia   . Vitamin D deficiency     Past Surgical History:  Procedure Laterality Date  . GLAUCOMA SURGERY  2013  . TONSILLECTOMY      Family History  Problem Relation Age of Onset  . Heart attack Brother   . Heart disease Brother   . Heart attack Brother   . Heart disease Brother   . Hypertension Mother   . Stroke Mother   . Heart attack Father   . Diabetes Father     Social History:  Lives alone --independent without AD and drives. Has two sons who live out of town-- worked for 10 years then stayed at home to raise her children. She smoked for about 30 years  reports that she quit smoking about 41 years ago. She has never used smokeless tobacco. She reports that she does not drink alcohol and does not use drugs.    Allergies  Allergen Reactions  . Adhesive [Tape]   . Advil [Ibuprofen]   . Atenolol     Medications Prior to Admission  Medication Sig Dispense Refill  . Cholecalciferol (VITAMIN D3) 125 MCG (5000 UT) CAPS Take 5,000 Units by mouth daily.    . hydrocortisone (  ANUSOL-HC) 2.5 % rectal cream Place 1 application rectally 2 (two) times daily. 30 g 0  . latanoprost (XALATAN) 0.005 % ophthalmic solution Place 1 drop into both eyes daily.    . SENNA PO Take 1 tablet by mouth daily.    . hydrocortisone (ANUSOL-HC) 25 MG suppository Place 1 suppository (25 mg total) rectally 2 (two) times daily. (Patient not taking: No sig reported) 12 suppository 0    Drug Regimen Review  Drug regimen was reviewed and remains  appropriate with no significant issues identified  Home: Home Living Family/patient expects to be discharged to:: Private residence Living Arrangements: Alone Available Help at Discharge: Family,Neighbor,Friend(s),Available PRN/intermittently Type of Home: House Home Access: Stairs to enter [employer] of Steps: 2 Entrance Stairs-Rails: Right Home Layout: Two level,Able to live on main level with bedroom/bathroom Alternate Level Stairs-Number of Steps: flight Bathroom Shower/Tub: Walk-in shower,Tub/shower unit [employer]: Handicapped height Bathroom Accessibility: Yes Home Equipment: Engineer, production - 2 wheels,Grab bars - toilet Additional Comments: reports usually takes baths in the tub  Lives With: Alone   Functional History: Prior Function Level of Independence: Independent Comments: Neighbors bring food in a lot for her, and she warms it up in the microwave. Goes out to lunch almost daily. Drives.  Functional Status:  Mobility: Bed Mobility Overal bed mobility: Needs Assistance Bed Mobility: Supine to Sit Supine to sit: Supervision,HOB elevated General bed mobility comments: Increased time to complete. Noted increased processing time to figure out how to get feet out from under covers. Arms tangled in lines under the blankets. Gross supervision overall. Transfers Overall transfer level: Needs assistance Equipment used: 1 person hand held assist Transfers: Sit to/from Stand Sit to Stand: Min assist General transfer comment: Assist required to gain/maintain standing balance EOB. Ambulation/Gait Ambulation/Gait assistance: Min assist Gait Distance (Feet): 175 Feet Assistive device: 1 person hand held assist Gait Pattern/deviations: Step-through pattern,Decreased stride length,Drifts right/left,Trunk flexed General Gait Details: Pt reporting she is off balance because she is walking in hospital socks instead of shoes. With head turns, pt reports dizziness  and balance deficits more prominent. Min assist throughout gait training at gait belt. Gait velocity: Decreased Gait velocity interpretation: <1.8 ft/sec, indicate of risk for recurrent falls    ADL: ADL Overall ADL's : Needs assistance/impaired Grooming: Min guard,Standing Upper Body Bathing: Set up,Sitting Lower Body Bathing: Min guard,Sit to/from stand Upper Body Dressing : Set up,Sitting Lower Body Dressing: Minimal assistance,Sit to/from stand Toilet Transfer: Min guard,Ambulation,Grab bars,Regular Toilet Toileting- Clothing Manipulation and Hygiene: Minimal assistance,Sit to/from stand Toileting - Clothing Manipulation Details (indicate cue type and reason): clothing mgmt Functional mobility during ADLs: Min guard General ADL Comments: patient limited by generalized weakness, balance  Cognition: Cognition Overall Cognitive Status: Impaired/Different from baseline Arousal/Alertness: Awake/alert Orientation Level: Oriented to person,Oriented to place,Oriented to time,Disoriented to situation Attention: Sustained Sustained Attention: Appears intact Memory: Impaired Memory Impairment: Decreased recall of new information Awareness: Impaired Awareness Impairment: Intellectual impairment Problem Solving: Appears intact (intact at basic level) Safety/Judgment: Appears intact Cognition Arousal/Alertness: Awake/alert Behavior During Therapy: WFL for tasks assessed/performed Overall Cognitive Status: Impaired/Different from baseline Area of Impairment: Memory,Problem solving,Safety/judgement Orientation Level: Disoriented to,Place,Time (Cannot recall name of hospital but knows she is in one; does not know day of the week or day of the month) Memory: Decreased short-term memory Safety/Judgement: Decreased awareness of deficits,Decreased awareness of safety Problem Solving: Slow processing,Requires verbal cues General Comments: some slow processing and decreased problem sovling,  decreased memory but able to self correct month (originally  voicing january)  Physical Exam: Blood pressure 121/83, pulse 75, temperature 98.1 F (36.7 C), temperature source Oral, resp. rate 15, height 5\' 2"  (1.575 m), weight 45.9 kg, SpO2 97 %. Physical Exam Constitutional:      General: She is not in acute distress.    Appearance: Normal appearance. She is normal weight.  HENT:     Head: Normocephalic and atraumatic.     Right Ear: External ear normal.     Left Ear: External ear normal.     Nose: Nose normal.     Mouth/Throat:     Mouth: Mucous membranes are moist.     Pharynx: Oropharynx is clear.  Eyes:     Pupils: Pupils are equal, round, and reactive to light.  Cardiovascular:     Rate and Rhythm: Normal rate and regular rhythm.     Heart sounds: No murmur heard. No gallop.   Pulmonary:     Effort: Pulmonary effort is normal. No respiratory distress.     Breath sounds: Normal breath sounds. No wheezing.  Abdominal:     General: Abdomen is flat. There is no distension.     Palpations: Abdomen is soft.  Musculoskeletal:        General: No swelling or deformity.     Cervical back: Normal range of motion and neck supple.  Skin:    General: Skin is warm and dry.  Neurological:     Mental Status: She is alert.     Comments: Alert and oriented x 3. Normal insight and awareness. Intact Memory. Normal language and speech. Cranial nerve exam unremarkable except for mild left C7. Is slightly HOH also. Mild pronator drift the left. RUE 5/5. LUE 4 to 4+/5 prox to distaly. RLE 5/5. LLE 4+/5. Senses pain and light touch in all 4's. DTR's 2+.   Psychiatric:        Mood and Affect: Mood normal.        Behavior: Behavior normal.        Thought Content: Thought content normal.     Results for orders placed or performed during the hospital encounter of 10/01/20 (from the past 48 hour(s))  Protime-INR     Status: None   Collection Time: 10/01/20  1:13 PM  Result Value Ref Range    Prothrombin Time 13.4 11.4 - 15.2 seconds   INR 1.1 0.8 - 1.2    Comment: (NOTE) INR goal varies based on device and disease states. Performed at [facility], [address], [place], [place] [address]   APTT     Status: None   Collection Time: 10/01/20  1:13 PM  Result Value Ref Range   aPTT 25 24 - 36 seconds    Comment: Performed at [facility], [address] [place], [place] [address]  CBC     Status: Abnormal   Collection Time: 10/01/20  1:13 PM  Result Value Ref Range   WBC 8.9 4.0 - 10.5 K/uL   RBC 4.22 3.87 - 5.11 MIL/uL   Hemoglobin 12.3 12.0 - 15.0 g/dL   HCT 09.8 11.9 - 14.7 %   MCV 88.2 80.0 - 100.0 fL   MCH 29.1 26.0 - 34.0 pg   MCHC 33.1 30.0 - 36.0 g/dL   RDW 82.9 56.2 - 13.0 %   Platelets 444 (H) 150 - 400 K/uL   nRBC 0.0 0.0 - 0.2 %    Comment: Performed at [facility], [address], [place], [place]  [address]  Differential     Status: None   Collection Time: 10/01/20  1:13 PM  Result Value Ref Range   Neutrophils Relative % 77 %   Neutro Abs 7.0 1.7 - 7.7 K/uL   Lymphocytes Relative 9 %   Lymphs Abs 0.8 0.7 - 4.0 K/uL   Monocytes Relative 11 %   Monocytes Absolute 1.0 0.1 - 1.0 K/uL   Eosinophils Relative 1 %   Eosinophils Absolute 0.1 0.0 - 0.5 K/uL   Basophils Relative 1 %   Basophils Absolute 0.1 0.0 - 0.1 K/uL   Immature Granulocytes 1 %   Abs Immature Granulocytes 0.05 0.00 - 0.07 K/uL    Comment: Performed at [facility], [address] [place], [place] [address]  Comprehensive metabolic panel     Status: Abnormal   Collection Time: 10/01/20  1:13 PM  Result Value Ref Range   Sodium 132 (L) 135 - 145 mmol/L   Potassium 3.8 3.5 - 5.1 mmol/L   Chloride 100 98 - 111 mmol/L   CO2 25 22 - 32 mmol/L   Glucose, Bld 108 (H) 70 - 99 mg/dL    Comment: Glucose reference range applies only to samples taken after fasting for at least 8 hours.   BUN 19 8 - 23 mg/dL   Creatinine, Ser 6.96 0.44 - 1.00 mg/dL   Calcium 9.5  8.9 - 29.5 mg/dL   Total Protein 6.6 6.5 - 8.1 g/dL   Albumin 3.1 (L) 3.5 - 5.0 g/dL   AST 17 15 - 41 U/L   ALT 15 0 - 44 U/L   Alkaline Phosphatase 60 38 - 126 U/L   Total Bilirubin 1.3 (H) 0.3 - 1.2 mg/dL   GFR, Estimated >28 >41 mL/min    Comment: (NOTE) Calculated using the CKD-EPI Creatinine Equation (2021)    Anion gap 7 5 - 15    Comment: Performed at [facility], [address], [place], [place] [address]  I-stat chem 8, ED     Status: Abnormal   Collection Time: 10/01/20  1:20 PM  Result Value Ref Range   Sodium 138 135 - 145 mmol/L   Potassium 3.9 3.5 - 5.1 mmol/L   Chloride 102 98 - 111 mmol/L   BUN 21 8 - 23 mg/dL   Creatinine, Ser 1.02 0.44 - 1.00 mg/dL   Glucose, Bld 725 (H) 70 - 99 mg/dL    Comment: Glucose reference range applies only to samples taken after fasting for at least 8 hours.   Calcium, Ion 1.32 1.15 - 1.40 mmol/L   TCO2 27 22 - 32 mmol/L   Hemoglobin 12.9 12.0 - 15.0 g/dL   HCT 36.6 44.0 - 34.7 %  CBG monitoring, ED     Status: None   Collection Time: 10/01/20  2:24 PM  Result Value Ref Range   Glucose-Capillary 87 70 - 99 mg/dL    Comment: Glucose reference range applies only to samples taken after fasting for at least 8 hours.   Comment 1 Notify RN    Comment 2 Document in Chart   Resp Panel by RT-PCR (Flu A&B, Covid) Nasopharyngeal Swab     Status: None   Collection Time: 10/01/20  5:54 PM   Specimen: Nasopharyngeal Swab; Nasopharyngeal(NP) swabs in vial transport medium  Result Value Ref Range   SARS Coronavirus 2 by RT PCR NEGATIVE NEGATIVE    Comment: (NOTE) SARS-CoV-2 target nucleic acids are NOT DETECTED.  The SARS-CoV-2 RNA is generally  detectable in upper respiratory specimens during the acute phase of infection. The lowest concentration of SARS-CoV-2 viral copies this assay can detect is 138 copies/mL. A negative result does not preclude SARS-Cov-2 infection and should not be used as the sole basis for treatment or other  patient management decisions. A negative result may occur with  improper specimen collection/handling, submission of specimen other than nasopharyngeal swab, presence of viral mutation(s) within the areas targeted by this assay, and inadequate number of viral copies(<138 copies/mL). A negative result must be combined with clinical observations, patient history, and epidemiological information. The expected result is Negative.  Fact Sheet for Patients:  BloggerCourse.com  Fact Sheet for Healthcare Providers:  SeriousBroker.it  This test is no t yet approved or cleared by the [place] FDA and  has been authorized for detection and/or diagnosis of SARS-CoV-2 by FDA under an Emergency Use Authorization (EUA). This EUA will remain  in effect (meaning this test can be used) for the duration of the COVID-19 declaration under Section 564(b)(1) of the Act, 21 U.S.C.section 360bbb-3(b)(1), unless the authorization is terminated  or revoked sooner.       Influenza A by PCR NEGATIVE NEGATIVE   Influenza B by PCR NEGATIVE NEGATIVE    Comment: (NOTE) The Xpert Xpress SARS-CoV-2/FLU/RSV plus assay is intended as an aid in the diagnosis of influenza from Nasopharyngeal swab specimens and should not be used as a sole basis for treatment. Nasal washings and aspirates are unacceptable for Xpert Xpress SARS-CoV-2/FLU/RSV testing.  Fact Sheet for Patients: BloggerCourse.com  Fact Sheet for Healthcare Providers: SeriousBroker.it  This test is not yet approved or cleared by the [place] FDA and has been authorized for detection and/or diagnosis of SARS-CoV-2 by FDA under an Emergency Use Authorization (EUA). This EUA will remain in effect (meaning this test can be used) for the duration of the COVID-19 declaration under Section 564(b)(1) of the Act, 21 U.S.C. section 360bbb-3(b)(1), unless  the authorization is terminated or revoked.  Performed at [facility], [address], [place], [place] [address]   Lipid panel     Status: None   Collection Time: 10/01/20  6:01 PM  Result Value Ref Range   Cholesterol 135 0 - 200 mg/dL   Triglycerides 51 <191 mg/dL   HDL 47 >47 mg/dL   Total CHOL/HDL Ratio 2.9 RATIO   VLDL 10 0 - 40 mg/dL   LDL Cholesterol 78 0 - 99 mg/dL    Comment:        Total Cholesterol/HDL:CHD Risk Coronary Heart Disease Risk Table                     Men   Women  1/2 Average Risk   3.4   3.3  Average Risk       5.0   4.4  2 X Average Risk   9.6   7.1  3 X Average Risk  23.4   11.0        Use the calculated Patient Ratio above and the CHD Risk Table to determine the patient's CHD Risk.        ATP III CLASSIFICATION (LDL):  <100     mg/dL   Optimal  829-562  mg/dL   Near or Above                    Optimal  130-159  mg/dL   Borderline  130-865  mg/dL   High  >784  mg/dL   Very High Performed at [facility], [address], [place], [place] [address]   Lactic acid, plasma     Status: None   Collection Time: 10/01/20  6:01 PM  Result Value Ref Range   Lactic Acid, Venous 0.9 0.5 - 1.9 mmol/L    Comment: Performed at [facility], [address], [place], [place] [address]  CK     Status: None   Collection Time: 10/01/20  6:01 PM  Result Value Ref Range   Total CK 55 38 - 234 U/L    Comment: Performed at [facility], [address], [place], [place] [address]  CBG monitoring, ED     Status: Abnormal   Collection Time: 10/01/20  6:53 PM  Result Value Ref Range   Glucose-Capillary 105 (H) 70 - 99 mg/dL    Comment: Glucose reference range applies only to samples taken after fasting for at least 8 hours.   Comment 1 Notify RN   Urinalysis, Routine w reflex microscopic Urine, Clean Catch     Status: Abnormal   Collection Time: 10/01/20  8:40 PM  Result Value Ref Range   Color, Urine YELLOW YELLOW   APPearance HAZY  (A) CLEAR   Specific Gravity, Urine >1.046 (H) 1.005 - 1.030   pH 6.0 5.0 - 8.0   Glucose, UA NEGATIVE NEGATIVE mg/dL   Hgb urine dipstick SMALL (A) NEGATIVE   Bilirubin Urine NEGATIVE NEGATIVE   Ketones, ur NEGATIVE NEGATIVE mg/dL   Protein, ur NEGATIVE NEGATIVE mg/dL   Nitrite POSITIVE (A) NEGATIVE   Leukocytes,Ua LARGE (A) NEGATIVE   RBC / HPF 11-20 0 - 5 RBC/hpf   WBC, UA >50 (H) 0 - 5 WBC/hpf   Bacteria, UA RARE (A) NONE SEEN   Squamous Epithelial / LPF 0-5 0 - 5    Comment: Performed at [facility], [address], [place], [place] [address]  Culture, Urine     Status: Abnormal (Preliminary result)   Collection Time: 10/01/20  8:47 PM   Specimen: Urine, Clean Catch  Result Value Ref Range   Specimen Description URINE, CLEAN CATCH    Special Requests NONE    Culture (A)     >=100,000 COLONIES/mL ESCHERICHIA COLI SUSCEPTIBILITIES TO FOLLOW Performed at [facility], [address], [place], [place] [address]    Report Status PENDING   Hemoglobin A1c     Status: Abnormal   Collection Time: 10/02/20  4:54 AM  Result Value Ref Range   Hgb A1c MFr Bld 5.8 (H) 4.8 - 5.6 %    Comment: (NOTE) Pre diabetes:          5.7%-6.4%  Diabetes:              >6.4%  Glycemic control for   <7.0% adults with diabetes    Mean Plasma Glucose 119.76 mg/dL    Comment: Performed at [facility], [address], [place], [place] [address]  TSH     Status: None   Collection Time: 10/02/20  4:54 AM  Result Value Ref Range   TSH 2.076 0.350 - 4.500 uIU/mL    Comment: Performed by a 3rd Generation assay with a functional sensitivity of <=0.01 uIU/mL. Performed at [facility], [address], [place], [place] [address]   Potassium     Status: None   Collection Time: 10/02/20  7:57 PM  Result Value Ref Range   Potassium 3.5  3.5 - 5.1 mmol/L    Comment: Performed at [facility], [address], [place], [place] [address]  Magnesium     Status: None    Collection Time: 10/02/20  7:57 PM  Result Value Ref Range   Magnesium 1.9 1.7 - 2.4 mg/dL    Comment: Performed at [facility], [address], [place], [place] [address]  Basic metabolic panel     Status: None   Collection Time: 10/03/20  3:53 AM  Result Value Ref Range   Sodium 137 135 - 145 mmol/L   Potassium 3.5 3.5 - 5.1 mmol/L   Chloride 104 98 - 111 mmol/L   CO2 27 22 - 32 mmol/L   Glucose, Bld 93 70 - 99 mg/dL    Comment: Glucose reference range applies only to samples taken after fasting for at least 8 hours.   BUN 17 8 - 23 mg/dL   Creatinine, Ser 7.82 0.44 - 1.00 mg/dL   Calcium 9.3 8.9 - 95.6 mg/dL   GFR, Estimated >21 >30 mL/min    Comment: (NOTE) Calculated using the CKD-EPI Creatinine Equation (2021)    Anion gap 6 5 - 15    Comment: Performed at [facility], [address] [place], [place] [address]  CBC     Status: Abnormal   Collection Time: 10/03/20  3:53 AM  Result Value Ref Range   WBC 5.3 4.0 - 10.5 K/uL   RBC 3.91 3.87 - 5.11 MIL/uL   Hemoglobin 11.3 (L) 12.0 - 15.0 g/dL   HCT 46.9 (L) 62.9 - 52.8 %   MCV 87.0 80.0 - 100.0 fL   MCH 28.9 26.0 - 34.0 pg   MCHC 33.2 30.0 - 36.0 g/dL   RDW 41.3 24.4 - 01.0 %   Platelets 409 (H) 150 - 400 K/uL   nRBC 0.0 0.0 - 0.2 %    Comment: Performed at [facility], [address], [place], [place] [address]   MR BRAIN WO CONTRAST  Result Date: 10/01/2020 CLINICAL DATA:  Slurred speech and left face droop beginning today. EXAM: MRI HEAD WITHOUT CONTRAST TECHNIQUE: Multiplanar, multiecho pulse sequences of the brain and surrounding structures were obtained without intravenous contrast. COMPARISON:  CT studies same day. FINDINGS: Brain: Small acute infarction in the left lateral cerebellum. Several punctate acute infarctions at the right frontoparietal vertex affecting the cortex and subcortical white matter. No confluent large vessel infarction. Generalized age related atrophy. Chronic small-vessel  ischemic changes of the pons. Numerous old small vessel cerebellar infarctions. Old small vessel infarctions of the thalami. Moderate chronic small-vessel ischemic changes of the white matter. No hydrocephalus, hemorrhage or extra-axial collection. Vascular: Major vessels at the base of the brain show flow. Skull and upper cervical spine: Negative Sinuses/Orbits: Sinuses are clear.  Orbits are negative. Other: Small right mastoid effusion. IMPRESSION: 1. Several small/punctate acute infarctions at the right frontoparietal vertex. 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 This distribution would suggest embolic disease from the heart or ascending aorta. 2. Numerous old small vessel ischemic changes throughout the brain as outlined above. Electronically Signed   By: Paulina Fusi M.D.   On: 10/01/2020 17:32   DG CHEST PORT 1 VIEW  Result Date: 10/01/2020 CLINICAL DATA:  TIA EXAM: PORTABLE CHEST 1 VIEW COMPARISON:  02/12/2012 FINDINGS: Cardiomegaly. No confluent opacities or effusions. No acute bony abnormality. IMPRESSION: Cardiomegaly.  No active disease. Electronically Signed   By: Charlett Nose M.D.  On: 10/01/2020 17:54   ECHOCARDIOGRAM COMPLETE  Result Date: 10/02/2020    ECHOCARDIOGRAM REPORT   Patient Name:   [name] Date of Exam: 10/02/2020 Medical Rec #:  [id number]          Height:       62.0 in Accession #:    [id number]         Weight:       101.2 lb Date of Birth:  08-25-1926           BSA:          1.431 m Patient Age:    89 years           BP:           123/75 mmHg Patient Gender: F                  HR:           78 bpm. Exam Location:  Inpatient Procedure: Color Doppler and Cardiac Doppler                            MODIFIED REPORT: This report was modified by Dietrich Pates MD on 10/02/2020 due to Complete.  Indications:     Stroke  History:         Patient has no prior history of Echocardiogram examinations.  Sonographer:     Roosvelt Maser RDCS Referring Phys:  [id number] [facility] A  SMITH Diagnosing Phys: Dietrich Pates MD IMPRESSIONS  1. LVEF is severely depressed with akinesis of the septum, inferior and apical walls; hypokinesis elsewhere. Left ventricular ejection fraction, by estimation, is 25%. The left ventricular internal cavity size was moderately dilated. Indeterminate diastolic filling due to E-A fusion.  2. Right ventricular systolic function is normal. The right ventricular size is normal.  3. Left atrial size was mildly dilated.  4. The mitral valve is abnormal. Mild mitral valve regurgitation.  5. The aortic valve is normal in structure. Aortic valve regurgitation is trivial.  6. The inferior vena cava is normal in size with greater than 50% respiratory variability, suggesting right atrial pressure of 3 mmHg. FINDINGS  Left Ventricle: LVEF is severely depressed with akinesis of the septum, inferior and apical walls; hypokinesis elsewhere. Left ventricular ejection fraction, by estimation, is 25%%. The left ventricle demonstrates regional wall motion abnormalities. 3D left ventricular ejection fraction analysis performed but not reported based on interpreter judgement due to suboptimal quality. The left ventricular internal cavity size was moderately dilated. There is no left ventricular hypertrophy. Indeterminate diastolic filling due to E-A fusion. Right Ventricle: The right ventricular size is normal. No increase in right ventricular wall thickness. Right ventricular systolic function is normal. Left Atrium: Left atrial size was mildly dilated. Right Atrium: Right atrial size was normal in size. Pericardium: Trivial pericardial effusion is present. Mitral Valve: The mitral valve is abnormal. There is mild thickening of the mitral valve leaflet(s). Mild mitral annular calcification. Mild mitral valve regurgitation. Tricuspid Valve: The tricuspid valve is normal in structure. Tricuspid valve regurgitation is mild. Aortic Valve: The aortic valve is normal in structure. Aortic valve  regurgitation is trivial. Aortic valve mean gradient measures 6.0 mmHg. Aortic valve peak gradient measures 11.3 mmHg. Aortic valve area, by VTI measures 1.43 cm. Pulmonic Valve: The pulmonic valve was normal in structure. Pulmonic valve regurgitation is trivial. Aorta: The aortic root is normal in size and structure. Venous: The inferior vena cava  is normal in size with greater than 50% respiratory variability, suggesting right atrial pressure of 3 mmHg. IAS/Shunts: The interatrial septum was not assessed.  LEFT VENTRICLE PLAX 2D LVIDd:         5.60 cm LVIDs:         4.90 cm LV PW:         0.90 cm LV IVS:        0.90 cm LVOT diam:     1.80 cm  3D Volume EF: LV SV:         41       3D EF:        43 % LV SV Index:   28       LV EDV:       168 ml LVOT Area:     2.54 cm LV ESV:       96 ml                         LV SV:        72 ml RIGHT VENTRICLE          IVC RV Basal diam:  3.60 cm  IVC diam: 1.30 cm LEFT ATRIUM             Index       RIGHT ATRIUM           Index LA diam:        3.60 cm 2.52 cm/m  RA Area:     12.50 cm LA Vol (A2C):   70.1 ml 48.98 ml/m RA Volume:   27.50 ml  19.22 ml/m LA Vol (A4C):   47.7 ml 33.33 ml/m LA Biplane Vol: 57.9 ml 40.46 ml/m  AORTIC VALVE AV Area (Vmax):    1.43 cm AV Area (Vmean):   1.28 cm AV Area (VTI):     1.43 cm AV Vmax:           168.00 cm/s AV Vmean:          112.000 cm/s AV VTI:            0.285 m AV Peak Grad:      11.3 mmHg AV Mean Grad:      6.0 mmHg LVOT Vmax:         94.60 cm/s LVOT Vmean:        56.400 cm/s LVOT VTI:          0.160 m LVOT/AV VTI ratio: 0.56  AORTA Ao Root diam: 2.90 cm Ao Asc diam:  3.20 cm  SHUNTS Systemic VTI:  0.16 m Systemic Diam: 1.80 cm Dietrich Pates MD Electronically signed by Dietrich Pates MD Signature Date/Time: 10/02/2020/12:23:41 PM    Final (Updated)    CT HEAD CODE STROKE WO CONTRAST  Result Date: 10/01/2020 CLINICAL DATA:  Code stroke. Acute stroke presentation. Deficit not described. EXAM: CT HEAD WITHOUT CONTRAST TECHNIQUE:  Contiguous axial images were obtained from the base of the skull through the vertex without intravenous contrast. COMPARISON:  None. FINDINGS: Brain: Generalized age related atrophy. Chronic appearing small vessel ischemic changes of the hemispheric white matter. Old lacunar infarction of the left thalamus. Old small vessel infarctions of the cerebellum. No finding of acute infarction. No mass, hemorrhage, hydrocephalus or extra-axial collection. Vascular: There is atherosclerotic calcification of the major vessels at the base of the brain. Skull: Negative Sinuses/Orbits: Clear/normal Other: None ASPECTS ([place]berta Stroke Program Early CT Score) - Ganglionic level infarction (caudate,  lentiform nuclei, internal capsule, insula, M1-M3 cortex): 7 - Supraganglionic infarction (M4-M6 cortex): 3 Total score (0-10 with 10 being normal): 10 IMPRESSION: 1. No acute finding by CT. Atrophy and chronic small-vessel ischemic changes. 2. ASPECTS is 10. 3. These results were communicated to Dr. Selina Cooley At 1:22 pmon 3/20/2022by text page via the [facility] messaging system. Electronically Signed   By: Paulina Fusi M.D.   On: 10/01/2020 13:22   VAS [place] LOWER EXTREMITY VENOUS (DVT)  Result Date: 10/02/2020  Lower Venous DVT Study Indications: Stroke.  Comparison Study: no prior Performing Technologist: Blanch Media RVS  Examination Guidelines: A complete evaluation includes B-mode imaging, spectral Doppler, color Doppler, and power Doppler as needed of all accessible portions of each vessel. Bilateral testing is considered an integral part of a complete examination. Limited examinations for reoccurring indications may be performed as noted. The reflux portion of the exam is performed with the patient in reverse Trendelenburg.  +---------+---------------+---------+-----------+----------+--------------+ RIGHT    CompressibilityPhasicitySpontaneityPropertiesThrombus Aging  +---------+---------------+---------+-----------+----------+--------------+ CFV      Full           Yes      Yes                                 +---------+---------------+---------+-----------+----------+--------------+ SFJ      Full                                                        +---------+---------------+---------+-----------+----------+--------------+ FV Prox  Full                                                        +---------+---------------+---------+-----------+----------+--------------+ FV Mid   Full                                                        +---------+---------------+---------+-----------+----------+--------------+ FV DistalFull                                                        +---------+---------------+---------+-----------+----------+--------------+ PFV      Full                                                        +---------+---------------+---------+-----------+----------+--------------+ POP      Full           Yes      Yes                                 +---------+---------------+---------+-----------+----------+--------------+ PTV      Full                                                        +---------+---------------+---------+-----------+----------+--------------+  PERO     Full                                                        +---------+---------------+---------+-----------+----------+--------------+   +---------+---------------+---------+-----------+----------+--------------+ LEFT     CompressibilityPhasicitySpontaneityPropertiesThrombus Aging +---------+---------------+---------+-----------+----------+--------------+ CFV      Full           Yes      Yes                                 +---------+---------------+---------+-----------+----------+--------------+ SFJ      Full                                                         +---------+---------------+---------+-----------+----------+--------------+ FV Prox  Full                                                        +---------+---------------+---------+-----------+----------+--------------+ FV Mid   Full                                                        +---------+---------------+---------+-----------+----------+--------------+ FV DistalFull                                                        +---------+---------------+---------+-----------+----------+--------------+ PFV      Full                                                        +---------+---------------+---------+-----------+----------+--------------+ POP      Full           Yes      Yes                                 +---------+---------------+---------+-----------+----------+--------------+ PTV      Full                                                        +---------+---------------+---------+-----------+----------+--------------+ PERO     Full                                                        +---------+---------------+---------+-----------+----------+--------------+  Summary: BILATERAL: - No evidence of deep vein thrombosis seen in the lower extremities, bilaterally. - No evidence of superficial venous thrombosis in the lower extremities, bilaterally. -No evidence of popliteal cyst, bilaterally.   *See table(s) above for measurements and observations. Electronically signed by Lemar Livings MD on 10/02/2020 at 11:20:48 AM.    Final    CT ANGIO HEAD CODE STROKE  Result Date: 10/01/2020 CLINICAL DATA:  Slurred speech and facial droop. EXAM: CT ANGIOGRAPHY HEAD AND NECK TECHNIQUE: Multidetector CT imaging of the head and neck was performed using the standard protocol during bolus administration of intravenous contrast. Multiplanar CT image reconstructions and MIPs were obtained to evaluate the vascular anatomy. Carotid stenosis measurements (when applicable)  are obtained utilizing NASCET criteria, using the distal internal carotid diameter as the denominator. CONTRAST:  75mL OMNIPAQUE IOHEXOL 350 MG/ML SOLN COMPARISON:  Head CT same day FINDINGS: CTA NECK FINDINGS Aortic arch: Aortic atherosclerosis and ectasia. Branching pattern is normal without origin stenosis. Right carotid system: Common carotid artery widely patent to the carotid bifurcation. Calcified plaque at the bifurcation and ICA bulb but no stenosis. Cervical ICA widely patent beyond that. Left carotid system: Common carotid artery widely patent to the bifurcation. Minimal calcified plaque at the carotid bifurcation. No stenosis. Cervical ICA widely patent. Vertebral arteries: Vertebral artery origins are widely patent. Vertebral arteries appear normal through the cervical region to the foramen magnum, though detail is somewhat limited due to extensive venous collateral flow adjacent to the vertebral arteries. Skeleton: Ordinary cervical spondylosis and facet osteoarthritis. Other neck: No mass or lymphadenopathy. Previous left thyroidectomy. Multiple nodules within the right lobe of the thyroid, the largest measuring 1.5 cm in diameter. Upper chest: Minimal emphysema in the upper lobes. Apparent occlusion of the innominate vein, possibly due to extrinsic compression between the arterial structures and right clavicular head. This is responsible for the pattern of venous drainage described above. Review of the MIP images confirms the above findings CTA HEAD FINDINGS Anterior circulation: Both internal carotid arteries are patent through the skull base and siphon regions. Ordinary mild siphon atherosclerotic calcification without stenosis. The anterior and middle cerebral vessels are patent. No large or medium vessel occlusion. No proximal stenosis. No aneurysm or vascular malformation. Fetal origin of the right PCA. Posterior circulation: Both vertebral arteries are patent through the foramen magnum to the  basilar. No basilar stenosis. Posterior circulation branch vessels are normal. Venous sinuses: Patent and normal. Anatomic variants: None other significant. Review of the MIP images confirms the above findings IMPRESSION: 1. Aortic atherosclerosis and ectasia. 2. Mild atherosclerotic change at both carotid bifurcations but without stenosis. 3. No intracranial large or medium vessel occlusion or correctable proximal stenosis. 4. Apparent occlusion of the innominate vein, possibly due to extrinsic compression between the arterial structures and right clavicular head. This is responsible for the pattern of venous collateral drainage described in the body of the report. 5. Previous left thyroidectomy. Multiple nodules within the right lobe of the thyroid, the largest measuring 1.4 cm in diameter. Recommend thyroid US (ref: J Am Coll Radiol. 2015 Feb;12(2): 143-50). Aortic Atherosclerosis (ICD10-I70.0) and Emphysema (ICD10-J43.9). Electronically Signed   By: Paulina Fusi M.D.   On: 10/01/2020 15:03   CT ANGIO NECK CODE STROKE  Result Date: 10/01/2020 CLINICAL DATA:  Slurred speech and facial droop. EXAM: CT ANGIOGRAPHY HEAD AND NECK TECHNIQUE: Multidetector CT imaging of the head and neck was performed using the standard protocol during bolus administration of intravenous contrast. Multiplanar CT image reconstructions and  MIPs were obtained to evaluate the vascular anatomy. Carotid stenosis measurements (when applicable) are obtained utilizing NASCET criteria, using the distal internal carotid diameter as the denominator. CONTRAST:  75mL OMNIPAQUE IOHEXOL 350 MG/ML SOLN COMPARISON:  Head CT same day FINDINGS: CTA NECK FINDINGS Aortic arch: Aortic atherosclerosis and ectasia. Branching pattern is normal without origin stenosis. Right carotid system: Common carotid artery widely patent to the carotid bifurcation. Calcified plaque at the bifurcation and ICA bulb but no stenosis. Cervical ICA widely patent beyond that.  Left carotid system: Common carotid artery widely patent to the bifurcation. Minimal calcified plaque at the carotid bifurcation. No stenosis. Cervical ICA widely patent. Vertebral arteries: Vertebral artery origins are widely patent. Vertebral arteries appear normal through the cervical region to the foramen magnum, though detail is somewhat limited due to extensive venous collateral flow adjacent to the vertebral arteries. Skeleton: Ordinary cervical spondylosis and facet osteoarthritis. Other neck: No mass or lymphadenopathy. Previous left thyroidectomy. Multiple nodules within the right lobe of the thyroid, the largest measuring 1.5 cm in diameter. Upper chest: Minimal emphysema in the upper lobes. Apparent occlusion of the innominate vein, possibly due to extrinsic compression between the arterial structures and right clavicular head. This is responsible for the pattern of venous drainage described above. Review of the MIP images confirms the above findings CTA HEAD FINDINGS Anterior circulation: Both internal carotid arteries are patent through the skull base and siphon regions. Ordinary mild siphon atherosclerotic calcification without stenosis. The anterior and middle cerebral vessels are patent. No large or medium vessel occlusion. No proximal stenosis. No aneurysm or vascular malformation. Fetal origin of the right PCA. Posterior circulation: Both vertebral arteries are patent through the foramen magnum to the basilar. No basilar stenosis. Posterior circulation branch vessels are normal. Venous sinuses: Patent and normal. Anatomic variants: None other significant. Review of the MIP images confirms the above findings IMPRESSION: 1. Aortic atherosclerosis and ectasia. 2. Mild atherosclerotic change at both carotid bifurcations but without stenosis. 3. No intracranial large or medium vessel occlusion or correctable proximal stenosis. 4. Apparent occlusion of the innominate vein, possibly due to extrinsic  compression between the arterial structures and right clavicular head. This is responsible for the pattern of venous collateral drainage described in the body of the report. 5. Previous left thyroidectomy. Multiple nodules within the right lobe of the thyroid, the largest measuring 1.4 cm in diameter. Recommend thyroid US (ref: J Am Coll Radiol. 2015 Feb;12(2): 143-50). Aortic Atherosclerosis (ICD10-I70.0) and Emphysema (ICD10-J43.9). Electronically Signed   By: Paulina Fusi M.D.   On: 10/01/2020 15:03       Medical Problem List and Plan: 1.  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  Antithrombotics: -DVT/anticoagulation:  Pharmaceutical:  Eliquis to start in am.   -antiplatelet therapy:  N/A  3. Chronic neck/back painPain Management: N/A--managed with manipulation every 2 weeks.  4. Mood: LCSW to follow for evaluation and support.   -antipsychotic agents: N/A 5. Neuropsych: This patient is capable of making decisions on her own behalf. 6. Skin/Wound Care: Routine pressure relief measures.  7. Fluids/Electrolytes/Nutrition: Monitor I/O. Check lytes in am.  8. HTN: Monitor BP tid--avoid hypoperfusion. 9. Cardiomyopathy/Asymptomatic episode NSVT: Low dose BB added on 03/22 per Dr. Alveta Heimlich.  --monitor for symptoms with increased activity.   --conservative management and follow up on outpatient basis.   -check daily weights 10. Impaired Fasting glucose: Hgb A1C-5.8--stable  11. Gram negative UTI: Treated with dose of fosfomycin on 03/21 12. Anxiety d/o: Used Xanax prn at nights for sleep.      Jacquelynn Creeamela S Love, PA-C 10/03/2020

## 2020-10-03 NOTE — Progress Notes (Addendum)
STROKE TEAM PROGRESS NOTE   INTERVAL HISTORY Son at bedside, Afebrile. VSS. On exam, patient awake alert orientated x3, no aphasia, follows simple commands and slight dysarthria due to left facial droop.  Moving all extremities symmetrically, sensation symmetrical, finger-to-nose intact bilaterally.  Vitals:   10/02/20 2315 10/03/20 0326 10/03/20 0700 10/03/20 1100  BP: 103/62 134/84 135/83 121/83  Pulse: 68 78 77 75  Resp: 20 18  15   Temp: 98.2 F (36.8 C) 97.6 F (36.4 C) 97.6 F (36.4 C) 98.1 F (36.7 C)  TempSrc: Oral Oral Oral Oral  SpO2: 98% 99% 97% 97%  Weight:      Height:       CBC:  Recent Labs  Lab 10/01/20 1313 10/01/20 1320 10/03/20 0353  WBC 8.9  --  5.3  NEUTROABS 7.0  --   --   HGB 12.3 12.9 11.3*  HCT 37.2 38.0 34.0*  MCV 88.2  --  87.0  PLT 444*  --  409*   Basic Metabolic Panel:  Recent Labs  Lab 10/01/20 1313 10/01/20 1320 10/02/20 1957 10/03/20 0353  NA 132* 138  --  137  K 3.8 3.9 3.5 3.5  CL 100 102  --  104  CO2 25  --   --  27  GLUCOSE 108* 107*  --  93  BUN 19 21  --  17  CREATININE 0.88 0.80  --  0.84  CALCIUM 9.5  --   --  9.3  MG  --   --  1.9  --    Lipid Panel:  Recent Labs  Lab 10/01/20 1801  CHOL 135  TRIG 51  HDL 47  CHOLHDL 2.9  VLDL 10  LDLCALC 78   HgbA1c:  Recent Labs  Lab 10/02/20 0454  HGBA1C 5.8*   IMAGING past 24 hours MR BRAIN WO CONTRAST  Result Date: 10/01/2020  IMPRESSION:  1. Several small/punctate acute infarctions at the right frontoparietal vertex. 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 This distribution would suggest embolic disease from the heart or ascending aorta.  2. Numerous old small vessel ischemic changes throughout the brain as outlined above.   DG CHEST PORT 1 VIEW  Result Date: 10/01/2020 CLINICAL DATA:  TIA EXAM: PORTABLE CHEST 1 VIEW COMPARISON:  02/12/2012 FINDINGS: Cardiomegaly. No confluent opacities or effusions. No acute bony abnormality. IMPRESSION:  Cardiomegaly.  No active disease. Electronically Signed   By: 02/14/2012 M.D.   On: 10/01/2020 17:54   ECHOCARDIOGRAM COMPLETE  Result Date: 10/02/2020 IMPRESSIONS   1. LVEF is severely depressed with akinesis of the septum, inferior and apical walls; hypokinesis elsewhere. Left ventricular ejection fraction, by estimation, is 25%. The left ventricular internal cavity size was moderately dilated. Indeterminate diastolic filling due to E-A fusion.   2. Right ventricular systolic function is normal. The right ventricular size is normal.   3. Left atrial size was mildly dilated.   4. The mitral valve is abnormal. Mild mitral valve regurgitation.   5. The aortic valve is normal in structure. Aortic valve regurgitation is trivial.   6. The inferior vena cava is normal in size with greater than 50% respiratory variability, suggesting right atrial pressure of 3 mmHg.  VAS 10/04/2020 LOWER EXTREMITY VENOUS (DVT)  Result Date: 10/02/2020 Summary: BILATERAL: - No evidence of deep vein thrombosis seen in the lower extremities, bilaterally. - No evidence of superficial venous thrombosis in the lower extremities, bilaterally. -No evidence of popliteal cyst, bilaterally.      CT ANGIO  Head/ neck CODE STROKE  Result Date: 10/01/2020 IMPRESSION:  1. Aortic atherosclerosis and ectasia.  2. Mild atherosclerotic change at both carotid bifurcations but without stenosis.  3. No intracranial large or medium vessel occlusion or correctable proximal stenosis.  4. Apparent occlusion of the innominate vein, possibly due to extrinsic compression between the arterial structures and right clavicular head. This is responsible for the pattern of venous collateral drainage described in the body of the report.  5. Previous left thyroidectomy. Multiple nodules within the right lobe of the thyroid, the largest measuring 1.4 cm in diameter. Recommend thyroid US   PHYSICAL EXAM Constitutional: Appears well-developed and  well-nourished.  Eyes: No scleral injection HENT: No OP obstrucion Head: Normocephalic.  Cardiovascular: Normal rate and regular rhythm.  Respiratory: Effort normal  GI: Soft.  No distension. There is no tenderness.  Skin: WDI  Neuro: Mental Status: Patient is awake, alert, oriented to person, place, month, year, and situation. Patient is able to give a clear and coherent history. Mild dysarthria No signs of aphasia or neglect Cranial Nerves: II: Visual Fields are full. Pupils are equal, round, and reactive to light.  III,IV, VI: EOMI without ptosis or diploplia.  V: Facial sensation is symmetric to light touch VII: left facial droop VIII: hearing is intact to voice X: Uvula elevates symmetrically XI: Shoulder shrug is symmetric. XII: tongue is midline without atrophy or fasciculations.  Motor: Tone is normal. Bulk is normal. 4/5 strength was present in all four extremities.  Sensory: Sensation is symmetric to light touch in the arms and legs.  Plantars: Toes are downgoing bilaterally.  Cerebellar: FNF and HKS are intact bilaterally.    ASSESSMENT/PLAN Leslie [name] is a 87 y.o. female with history of HTN, HLD, pre DMII, glaucoma, and Vit D deficiency presenting with slurred speech with a left facial droop.   Stroke: Small left cerebellum and several punctate right frontoparietal vertex infarcts, etiology likely due to cardiomyopathy with low EF.  Occult A. fib also possible.  Code Stroke CT head No acute abnormality. Atrophy and chronic small-vessel ischemic changes. ASPECTS 10.   CTA head & neck Aortic atherosclerosis and ectasia.  No intracranial large vessel occlusion. Occlusion of the innominate vein  MRI  Several small/punctate acute infarctions at the right frontoparietal vertex. 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  LE venous dopplers: negative for DVT  2D Echo EF 25%, severely depressed LVEF with akinesis of the septum, inferior and  apical walls, hypokinesis elsewhere.   LDL 78  HgbA1c 5.8  VTE prophylaxis - Lovenox  Diet: heart healthy  No antithrombotic prior to admission, now on aspirin 81 mg daily and clopidogrel 75 mg daily.  Will start Eliquis 2.5mg  bid given her cardiomyopathy and recent stroke and s/p ASA and plavix.  If her EF improves after 3 months, switch back to antiplatelet therapy.    Therapy recommendations:  CIR  Disposition:  CIR  Cardiomyopathy  EF 25%, severely depressed LVEF with akinesis of the septum, inferior and apical walls, hypokinesis elsewhere.  Cardiology on board  Eliquis 2.5mg  bid given her cardiomyopathy and recent stroke.   Hypertension  Home meds:  none  Stable  Metoprolol 25mg  daily . Long-term BP goal normotensive  Hyperlipidemia  Home meds:  none  LDL 78, goal < 70  Add atorvastatin 20mg  daily   Continue statin at discharge  UTI  UA (3/20): large LEs, positive nitrite, WBC >50  Culture: positive for E.Coli  Fosfomycin 3g oral given today  Other Stroke Risk Factors  Advanced Age >/= 59   Other Active Problems   Previous left thyroidectomy: multiple nodules within the right lobe of the thyroid.  Will need follow-up with PCP for further eval and management  Hospital day # 2  ATTENDING NOTE: I reviewed above note and agree with the assessment and plan. Pt was seen and examined.   Son at bedside.  Sitting in bed, left facial droop and slurred speech improved from yesterday.  No other neuro changes, no acute event overnight.  Discussed with Dr. Anne Fu and Dr. Wyonia Hough, given her low EF and current stroke, will start Eliquis 2.5 mg twice daily for stroke prevention.  Once EF more than 75%, can switch her back to antiplatelet therapy.  Continue Lipitor.  Patient is ready for rehab admission today.  For detailed assessment and plan, please refer to above as I have made changes wherever appropriate.   Neurology will sign off. Please call with  questions. Pt will follow up with stroke clinic NP at [facility] in about 4 weeks. Thanks for the consult.   Marvel Plan, MD PhD Stroke Neurology 10/03/2020 3:46 PM       To contact Stroke Continuity provider, please refer to [email or web address]. After hours, contact General Neurology

## 2020-10-03 NOTE — Progress Notes (Addendum)
Inpatient Rehabilitation Medication Review by a Pharmacist  A complete drug regimen review was completed for this patient to identify any potential clinically significant medication issues.  Clinically significant medication issues were identified:  Yes   Type of Medication Issue Identified Description of Issue Urgent (address now) Non-Urgent (address on AM team rounds) Plan Plan Accepted by Provider? (Yes / No / Pending AM Rounds)  Drug Interaction(s) (clinically significant)       Duplicate Therapy       Allergy       No Medication Administration End Date       Incorrect Dose       Additional Drug Therapy Needed       Other  The following medications were listed on discharge med list from [facility] (these were home meds that the pt did not receive at [facility]; meds not ordered on admission to CIR): Hydrocortisone 2% rectal cream Senna tablet Vitamin D tablet Non-urgent Pending review by CIR team on AM rounds Pending AM rounds    Name of provider notified for urgent issues identified:  N/A  For non-urgent medication issues to be resolved on team rounds tomorrow morning a [facility] Secure Chat Handoff was sent to:  Deatra Ina, PA  Time spent performing this drug regimen review (minutes):  15  Vicki Mallet, PharmD, BCPS, [facility] Clinical Pharmacist 10/03/2020 3:41 PM

## 2020-10-03 NOTE — Progress Notes (Signed)
Patient arrived on unit, oriented to unit. Reviewed medications, therapy schedule, rehab routine and plan of care. States an understanding of information reviewed. No complications noted at this time. Patient reports no pain and is AX4 Leslie [name] L [name]  

## 2020-10-03 NOTE — Progress Notes (Signed)
Inpatient Rehabilitation Admissions Coordinator  Inpatient rehab consult received. I met with patient at bedside for assessment. We discussed goals and expectations of a possible CIR admit. She would like to discuss with her son , [name]. I left [name] a voicemail to contact me . I will follow up with patient today.  Danne Baxter, RN, MSN Rehab Admissions Coordinator [phone] 10/03/2020 10:20 AM

## 2020-10-04 DIAGNOSIS — E46 Unspecified protein-calorie malnutrition: Secondary | ICD-10-CM

## 2020-10-04 DIAGNOSIS — I5042 Chronic combined systolic (congestive) and diastolic (congestive) heart failure: Secondary | ICD-10-CM | POA: Diagnosis not present

## 2020-10-04 DIAGNOSIS — I6310 Cerebral infarction due to embolism of unspecified precerebral artery: Secondary | ICD-10-CM

## 2020-10-04 DIAGNOSIS — R7303 Prediabetes: Secondary | ICD-10-CM

## 2020-10-04 DIAGNOSIS — I10 Essential (primary) hypertension: Secondary | ICD-10-CM

## 2020-10-04 DIAGNOSIS — E8809 Other disorders of plasma-protein metabolism, not elsewhere classified: Secondary | ICD-10-CM | POA: Diagnosis not present

## 2020-10-04 DIAGNOSIS — G8929 Other chronic pain: Secondary | ICD-10-CM

## 2020-10-04 DIAGNOSIS — M545 Low back pain, unspecified: Secondary | ICD-10-CM

## 2020-10-04 LAB — URINE CULTURE: Culture: >=100000 — AB

## 2020-10-04 LAB — CBC WITH DIFFERENTIAL/PLATELET
Abs Immature Granulocytes: 0.02 10*3/uL (ref 0.00–0.07)
Basophils Absolute: 0.1 10*3/uL (ref 0.0–0.1)
Basophils Relative: 3 %
Eosinophils Absolute: 0.1 10*3/uL (ref 0.0–0.5)
Eosinophils Relative: 3 %
HCT: 35.3 % — ABNORMAL LOW (ref 36.0–46.0)
Hemoglobin: 11.3 g/dL — ABNORMAL LOW (ref 12.0–15.0)
Immature Granulocytes: 1 %
Lymphocytes Relative: 23 %
Lymphs Abs: 1 10*3/uL (ref 0.7–4.0)
MCH: 28.4 pg (ref 26.0–34.0)
MCHC: 32 g/dL (ref 30.0–36.0)
MCV: 88.7 fL (ref 80.0–100.0)
Monocytes Absolute: 0.5 10*3/uL (ref 0.1–1.0)
Monocytes Relative: 13 %
Neutro Abs: 2.5 10*3/uL (ref 1.7–7.7)
Neutrophils Relative %: 57 %
Platelets: 421 10*3/uL — ABNORMAL HIGH (ref 150–400)
RBC: 3.98 MIL/uL (ref 3.87–5.11)
RDW: 15.1 % (ref 11.5–15.5)
WBC: 4.3 10*3/uL (ref 4.0–10.5)
nRBC: 0 % (ref 0.0–0.2)

## 2020-10-04 LAB — COMPREHENSIVE METABOLIC PANEL
ALT: 13 U/L (ref 0–44)
AST: 17 U/L (ref 15–41)
Albumin: 2.6 g/dL — ABNORMAL LOW (ref 3.5–5.0)
Alkaline Phosphatase: 47 U/L (ref 38–126)
Anion gap: 3 — ABNORMAL LOW (ref 5–15)
BUN: 12 mg/dL (ref 8–23)
CO2: 28 mmol/L (ref 22–32)
Calcium: 9.2 mg/dL (ref 8.9–10.3)
Chloride: 107 mmol/L (ref 98–111)
Creatinine, Ser: 0.78 mg/dL (ref 0.44–1.00)
GFR, Estimated: >60 mL/min (ref >60)
Glucose, Bld: 97 mg/dL (ref 70–99)
Potassium: 4.8 mmol/L (ref 3.5–5.1)
Sodium: 138 mmol/L (ref 135–145)
Total Bilirubin: 0.6 mg/dL (ref 0.3–1.2)
Total Protein: 5.5 g/dL — ABNORMAL LOW (ref 6.5–8.1)

## 2020-10-04 MED ORDER — POLYETHYLENE GLYCOL 3350 17 G PO PACK
17.0000 g | PACK | Freq: Two times a day (BID) | ORAL | Status: DC
  Administered 2020-10-04 – 2020-10-10 (×12): 17 g via ORAL
  Filled 2020-10-04 (×12): qty 1

## 2020-10-04 MED ORDER — PROSOURCE PLUS PO LIQD
30.0000 mL | Freq: Two times a day (BID) | ORAL | Status: DC
  Administered 2020-10-04 – 2020-10-09 (×11): 30 mL via ORAL
  Filled 2020-10-04 (×12): qty 30

## 2020-10-04 NOTE — Progress Notes (Signed)
[place] PHYSICAL MEDICINE & REHABILITATION PROGRESS NOTE  Subjective/Complaints: Patient seen standing/sitting, working with therapies this AM.  She states she slept well overnight.   ROS: Denies CP, SOB, N/V/D  Objective: Vital Signs: Blood pressure 126/83, pulse 77, temperature 97.8 F (36.6 C), resp. rate 17, height 5\' 5"  (1.651 m), weight 48.9 kg, SpO2 99 %. VAS LOWER EXTREMITY VENOUS (DVT)  Result Date: 10/02/2020  Lower Venous DVT Study Indications: Stroke.  Comparison Study: no prior Performing Technologist: 10/04/2020 RVS  Examination Guidelines: A complete evaluation includes B-mode imaging, spectral Doppler, color Doppler, and power Doppler as needed of all accessible portions of each vessel. Bilateral testing is considered an integral part of a complete examination. Limited examinations for reoccurring indications may be performed as noted. The reflux portion of the exam is performed with the patient in reverse Trendelenburg.  +---------+---------------+---------+-----------+----------+--------------+ RIGHT    CompressibilityPhasicitySpontaneityPropertiesThrombus Aging +---------+---------------+---------+-----------+----------+--------------+ CFV      Full           Yes      Yes                                 +---------+---------------+---------+-----------+----------+--------------+ SFJ      Full                                                        +---------+---------------+---------+-----------+----------+--------------+ FV Prox  Full                                                        +---------+---------------+---------+-----------+----------+--------------+ FV Mid   Full                                                        +---------+---------------+---------+-----------+----------+--------------+ FV DistalFull                                                         +---------+---------------+---------+-----------+----------+--------------+ PFV      Full                                                        +---------+---------------+---------+-----------+----------+--------------+ POP      Full           Yes      Yes                                 +---------+---------------+---------+-----------+----------+--------------+ PTV      Full                                                        +---------+---------------+---------+-----------+----------+--------------+  PERO     Full                                                        +---------+---------------+---------+-----------+----------+--------------+   +---------+---------------+---------+-----------+----------+--------------+ LEFT     CompressibilityPhasicitySpontaneityPropertiesThrombus Aging +---------+---------------+---------+-----------+----------+--------------+ CFV      Full           Yes      Yes                                 +---------+---------------+---------+-----------+----------+--------------+ SFJ      Full                                                        +---------+---------------+---------+-----------+----------+--------------+ FV Prox  Full                                                        +---------+---------------+---------+-----------+----------+--------------+ FV Mid   Full                                                        +---------+---------------+---------+-----------+----------+--------------+ FV DistalFull                                                        +---------+---------------+---------+-----------+----------+--------------+ PFV      Full                                                        +---------+---------------+---------+-----------+----------+--------------+ POP      Full           Yes      Yes                                  +---------+---------------+---------+-----------+----------+--------------+ PTV      Full                                                        +---------+---------------+---------+-----------+----------+--------------+ PERO     Full                                                        +---------+---------------+---------+-----------+----------+--------------+  Summary: BILATERAL: - No evidence of deep vein thrombosis seen in the lower extremities, bilaterally. - No evidence of superficial venous thrombosis in the lower extremities, bilaterally. -No evidence of popliteal cyst, bilaterally.   *See table(s) above for measurements and observations. Electronically signed by Lemar Livings MD on 10/02/2020 at 11:20:48 AM.    Final    Recent Labs    10/03/20 0353 10/04/20 0535  WBC 5.3 4.3  HGB 11.3* 11.3*  HCT 34.0* 35.3*  PLT 409* 421*   Recent Labs    10/03/20 0353 10/04/20 0535  NA 137 138  K 3.5 4.8  CL 104 107  CO2 27 28  GLUCOSE 93 97  BUN 17 12  CREATININE 0.84 0.78  CALCIUM 9.3 9.2    Intake/Output Summary (Last 24 hours) at 10/04/2020 0930 Last data filed at 10/04/2020 0715 Gross per 24 hour  Intake 444 ml  Output --  Net 444 ml        Physical Exam: BP 126/83   Pulse 77   Temp 97.8 F (36.6 C)   Resp 17   Ht 5\' 5"  (1.651 m)   Wt 48.9 kg   SpO2 99%   BMI 17.94 kg/m  Constitutional: No distress . Vital signs reviewed. HENT: Normocephalic.  Atraumatic. Eyes: EOMI. No discharge. Cardiovascular: No JVD.  RRR. Respiratory: Normal effort.  No stridor.  Bilateral clear to auscultation. GI: Non-distended.  BS +. Skin: Warm and dry.  Intact. Psych: Normal mood.  Normal behavior. Musc: No edema in extremities.  No tenderness in extremities. Neuro: Alert HOH Motor: LUE: 4+/5 proximal to distal LLE: 4+/5 proximal to distal  Sensation diminished to light touch left hand  Assessment/Plan: 1. Functional deficits which require 3+ hours per day of  interdisciplinary therapy in a comprehensive inpatient rehab setting.  Physiatrist is providing close team supervision and 24 hour management of active medical problems listed below.  Physiatrist and rehab team continue to assess barriers to discharge/monitor patient progress toward functional and medical goals   Care Tool:  Bathing              Bathing assist       Upper Body Dressing/Undressing Upper body dressing        Upper body assist      Lower Body Dressing/Undressing Lower body dressing      What is the patient wearing?: Underwear/pull up     Lower body assist Assist for lower body dressing: Minimal Assistance - Patient > 75%     Toileting Toileting    Toileting assist Assist for toileting: Contact Guard/Touching assist     Transfers Chair/bed transfer  Transfers assist           Locomotion Ambulation   Ambulation assist              Walk 10 feet activity   Assist           Walk 50 feet activity   Assist           Walk 150 feet activity   Assist           Walk 10 feet on uneven surface  activity   Assist           Wheelchair     Assist               Wheelchair 50 feet with 2 turns activity    Assist            Wheelchair 150  feet activity     Assist           Medical Problem List and Plan: 1.Functional deficits and 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 Please see conference note from today as well.  2. Antithrombotics: -DVT/anticoagulation:Pharmaceutical:Eliquis -antiplatelet therapy: N/A 3.Chronic neck/back painPain Management:             Monitor with increased exertion 4. Mood:LCSW to follow for evaluation and  support. -antipsychotic agents: N/A 5. Neuropsych: This patientiscapable of making decisions on herown behalf. 6. Skin/Wound Care:Routine pressure relief measures. 7. Fluids/Electrolytes/Nutrition:Monitor I/Os. 8. HTN: Monitor BP tid--avoid hypoperfusion.            Monitor with increased mobilitty  9. Cardiomyopathy/combined CHF/Asymptomatic episode NSVT: Low dose BB added on 03/22 per Dr. Alveta Heimlich. --monitor for symptoms with therapy activities.  --conservative management and follow up on outpatient basis.  Filed Weights   10/03/20 1513  Weight: 48.9 kg  10. Prediabetes: Hgb A1C-5.8  Monitor with increased mobility  11. Gram negative UTI: Treated with dose of fosfomycin on 03/21             Continue to monitor 12. Anxiety d/o: Used Xanax prn at nights for sleep 13. Hypoalbuminemia  Supplement initiated on 3/22  LOS: 1 days A FACE TO FACE EVALUATION WAS PERFORMED  [employer] Karis Juba 10/04/2020, 9:30 AM

## 2020-10-04 NOTE — TOC Benefit Eligibility Note (Signed)
Patient Product/process development scientist completed.    The patient is currently admitted and upon discharge could be taking Eliquis 2.5 mg.  The current 30 day co-pay is, $527.00.   The patient is insured through [facility] Medicare Part D    Roland Earl, CPhT Pharmacy Patient Advocate Specialist [facility] Antimicrobial Stewardship Team Direct Number: [phone]  Fax: [phone]

## 2020-10-04 NOTE — Evaluation (Signed)
Physical Therapy Assessment and Plan  Patient Details  Name: Leslie [name] MRN: [id number] Date of Birth: 1927/06/11  PT Diagnosis: Abnormality of gait, Difficulty walking and Muscle weakness Rehab Potential: Good ELOS: 5-7 days   Today's Date: 10/04/2020 PT Individual Time: 7829-5621 PT Individual Time Calculation (min): 55 min    Hospital Problem: Principal Problem:   Embolic stroke ([place]) Active Problems:   Hypoalbuminemia due to protein-calorie malnutrition ([place])   Chronic combined systolic and diastolic CHF (congestive heart failure) ([place])   Benign essential HTN   Chronic low back pain without sciatica   Past Medical History:  Past Medical History:  Diagnosis Date  . Anxiety disorder   . Glaucoma   . Hearing loss   . HTN (hypertension)   . Hyperglycemia   . Hyperlipidemia   . Vitamin D deficiency    Past Surgical History:  Past Surgical History:  Procedure Laterality Date  . GLAUCOMA SURGERY  2013  . TONSILLECTOMY      Assessment & Plan Clinical Impression: Patient is a 87 year old female with history of glaucoma, anxiety d/o, Vitamin D deficiency, chronic neck/back painwho was admitted on 10/01/2020 with slurred speech and facial droop after attending a church service. CTA head/neck showed apparent occlusion of the innominate vein possibly due to extensive compression and multiple right thyroid nodules. MRI brain showed several small/punctate infarcts in right frontoparietal vertex and small acute infarct left lateral cerebellum suggestive of embolic disease from heart or ascending aorta. 2D echo showed severely depressed LVEF with EF of 25% with akinesis of septum, inferior and apical wall and hypokinesis elsewhere. BLE Dopplers were negative for DVT. She was found to have evidence of UTI and was treated with fosfomycin. UCSordered andshowed >100,000 gram negative rods and pending.  Cardiology was consulted for input on cardiomyopathy and recommended holding  off on any goal-directed therapy however she did haveepisode of 17 beats asymptomaticNSVTlast p.m.and "very low dose metoprolol" added per per Dr. Marlou Porch. Dr. Erlinda Hong recommended DOAC given embolic stroke in setting of low EF with follow-up TTE in 3 months and transition to ASA once EF greater than 35%. Therapy evaluations completed revealing balance deficits with awareness of deficits. CIR recommended due to functional decline. Patient transferred to CIR on 10/03/2020 .   Patient currently requires min with mobility secondary to muscle weakness and decreased standing balance and decreased balance strategies.  Prior to hospitalization, patient was independent  with mobility and lived with Alone in a House home.  Home access is 2Stairs to enter.  Patient will benefit from skilled PT intervention to maximize safe functional mobility, minimize fall risk and decrease caregiver burden for planned discharge home with intermittent assist.  Anticipate patient will benefit from follow up [facility] at discharge.  PT - End of Session Activity Tolerance: Tolerates 30+ min activity without fatigue Endurance Deficit: No PT Assessment Rehab Potential (ACUTE/IP ONLY): Good PT Barriers to Discharge: Decreased caregiver support;Insurance for SNF coverage;Lack of/limited family support PT Patient demonstrates impairments in the following area(s): Balance;Motor;Safety;Perception PT Transfers Functional Problem(s): Bed Mobility;Bed to Chair;Car PT Locomotion Functional Problem(s): Ambulation;Stairs PT Plan PT Intensity: Minimum of 1-2 x/day ,45 to 90 minutes PT Frequency: 5 out of 7 days PT Duration Estimated Length of Stay: 5-7 days PT Treatment/Interventions: Ambulation/gait training;Discharge planning;Functional mobility training;Psychosocial support;Therapeutic Activities;Visual/perceptual remediation/compensation;Wheelchair propulsion/positioning;Therapeutic Exercise;Skin care/wound management;Neuromuscular  re-education;Disease management/prevention;Balance/vestibular training;Cognitive remediation/compensation;DME/adaptive equipment instruction;Pain management;Splinting/orthotics;UE/LE Strength taining/ROM;UE/LE Coordination activities;Stair training;Patient/family education;Community reintegration PT Transfers Anticipated Outcome(s): mod I PT Locomotion Anticipated Outcome(s): mod I PT Recommendation  Recommendations for Other Services: Neuropsych consult Follow Up Recommendations: Home health PT Patient destination: Home Equipment Recommended: To be determined   PT Evaluation Precautions/Restrictions Precautions Precautions: Fall Restrictions Weight Bearing Restrictions: No Home Living/Prior Functioning Home Living Available Help at Discharge: Family;Neighbor;Friend(s);Available PRN/intermittently Type of Home: House Home Access: Stairs to enter [employer] of Steps: 2 Entrance Stairs-Rails: Right Home Layout: Two level;Able to live on main level with bedroom/bathroom Alternate Level Stairs-Number of Steps: flight  Lives With: Alone Prior Function Level of Independence: Independent with gait;Independent with transfers  Able to Take Stairs?: Yes Driving: Yes Vocation: Retired Comments: Neighbors bring food in a lot for her, and she warms it up in the microwave. Goes out to lunch almost daily. Drives. Vision/Perception  Perception Perception: Within Functional Limits Spatial Orientation: difficulty with clock draw test; to be evaluated further Praxis Praxis: Intact  Cognition Overall Cognitive Status: Impaired/Different from baseline Arousal/Alertness: Awake/alert Orientation Level: Oriented X4 Attention: Sustained Sustained Attention: Appears intact Memory: Impaired Memory Impairment: Decreased recall of new information Memory Recall Sock: Without Cue Memory Recall Blue: With Cue Memory Recall Bed: Without Cue Awareness: Impaired Awareness Impairment:  Emergent impairment Problem Solving: Impaired Problem Solving Impairment: Functional complex Safety/Judgment: Appears intact Sensation Sensation Light Touch: Appears Intact Hot/Cold: Appears Intact Proprioception: Appears Intact Stereognosis: Appears Intact Coordination Gross Motor Movements are Fluid and Coordinated: Yes Fine Motor Movements are Fluid and Coordinated: Yes Motor  Motor Motor: Within Functional Limits Motor - Skilled Clinical Observations: generalized weakness  Trunk/Postural Assessment  Cervical Assessment Cervical Assessment: Exceptions to [facility] (rounded shoulders) Thoracic Assessment Thoracic Assessment: Exceptions to [facility] (mild kyphosis) Lumbar Assessment Lumbar Assessment: Exceptions to [facility] (posterior pelvic tilt) Postural Control Postural Control: Within Functional Limits  Balance Balance Balance Assessed: Yes Standardized Balance Assessment Standardized Balance Assessment: Berg Balance Test Berg Balance Test Sit to Stand: Able to stand without using hands and stabilize independently Standing Unsupported: Able to stand safely 2 minutes Sitting with Back Unsupported but Feet Supported on Floor or Stool: Able to sit safely and securely 2 minutes Stand to Sit: Sits safely with minimal use of hands Transfers: Able to transfer safely, minor use of hands Standing Unsupported with Eyes Closed: Able to stand 10 seconds with supervision Standing Ubsupported with Feet Together: Able to place feet together independently and stand for 1 minute with supervision From Standing, Reach Forward with Outstretched Arm: Can reach forward >12 cm safely (5") From Standing Position, Pick up Object from Floor: Able to pick up shoe safely and easily From Standing Position, Turn to Look Behind Over each Shoulder: Looks behind from both sides and weight shifts well Turn 360 Degrees: Able to turn 360 degrees safely but slowly Standing Unsupported, Alternately Place Feet on  Step/Stool: Able to complete 4 steps without aid or supervision Standing Unsupported, One Foot in Front: Able to take small step independently and hold 30 seconds Standing on One Leg: Unable to try or needs assist to prevent fall Total Score: 43 Dynamic Sitting Balance Dynamic Sitting - Level of Assistance: 5: Stand by assistance Dynamic Standing Balance Dynamic Standing - Level of Assistance: 5: Stand by assistance (pt stood in the shower to wash herself with supervision) Extremity Assessment  RUE Assessment RUE Assessment: Within Functional Limits LUE Assessment LUE Assessment: Within Functional Limits RLE Assessment RLE Assessment: Within Functional Limits General Strength Comments: Grossly 4+/5 LLE Assessment LLE Assessment: Exceptions to [facility] General Strength Comments: Grossly 4+/5  Care Tool Care Tool Bed Mobility Roll left and right activity  Roll left and right assist level: Independent    Sit to lying activity   Sit to lying assist level: Supervision/Verbal cueing    Lying to sitting edge of bed activity   Lying to sitting edge of bed assist level: Supervision/Verbal cueing     Care Tool Transfers Sit to stand transfer   Sit to stand assist level: Supervision/Verbal cueing    Chair/bed transfer   Chair/bed transfer assist level: Contact Guard/Touching assist     Physiological scientist transfer assist level: Contact Guard/Touching assist      Care Tool Locomotion Ambulation   Assist level: Contact Guard/Touching assist Assistive device: No Device Max distance: 141f  Walk 10 feet activity   Assist level: Contact Guard/Touching assist Assistive device: No Device   Walk 50 feet with 2 turns activity   Assist level: Contact Guard/Touching assist Assistive device: No Device  Walk 150 feet activity   Assist level: Contact Guard/Touching assist Assistive device: No Device  Walk 10 feet on uneven surfaces activity Walk 10 feet on uneven  surfaces activity did not occur: Safety/medical concerns      Stairs   Assist level: Contact Guard/Touching assist Stairs assistive device: 2 hand rails Max number of stairs: 12  Walk up/down 1 step activity   Walk up/down 1 step (curb) assist level: Contact Guard/Touching assist Walk up/down 1 step or curb assistive device: 2 hand rails    Walk up/down 4 steps activity Walk up/down 4 steps assist level: Contact Guard/Touching assist Walk up/down 4 steps assistive device: 2 hand rails  Walk up/down 12 steps activity   Walk up/down 12 steps assist level: Contact Guard/Touching assist Walk up/down 12 steps assistive device: 2 hand rails  Pick up small objects from floor   Pick up small object from the floor assist level: Contact Guard/Touching assist    Wheelchair Will patient use wheelchair at discharge?: No          Wheel 50 feet with 2 turns activity      Wheel 150 feet activity        Refer to Care Plan for Long Term Goals  SHORT TERM GOAL WEEK 1 PT Short Term Goal 1 (Week 1): STG = LTG due to ELOS  Recommendations for other services: Neuropsych  Skilled Therapeutic Intervention Mobility Bed Mobility Bed Mobility: Sit to Supine Sit to Supine: Supervision/Verbal cueing Transfers Transfers: Sit to Stand;Stand to Sit;Stand Pivot Transfers Sit to Stand: Supervision/Verbal cueing Stand to Sit: Supervision/Verbal cueing Stand Pivot Transfers: Supervision/Verbal cueing Stand Pivot Transfer Details: Verbal cues for technique;Verbal cues for sequencing;Verbal cues for precautions/safety Transfer (Assistive device): None Locomotion  Gait Ambulation: Yes Gait Assistance: Contact Guard/Touching assist Gait Distance (Feet): 175 Feet Assistive device: None Gait Gait: Yes Gait Pattern: Impaired Gait Pattern: Step-through pattern;Narrow base of support;Trunk flexed;Decreased step length - right;Decreased step length - left Gait velocity: Decreased Stairs / Additional  Locomotion Stairs: Yes Stairs Assistance: Contact Guard/Touching assist Stair Management Technique: Two rails Number of Stairs: 12 Height of Stairs: 6 Wheelchair Mobility Wheelchair Mobility: No  Skilled Intervention: Pt greeted seated in recliner, awake and agreeable to therapy. Pt is very pleasant, oriented x4. Answers all questions appropriately. Appears very motivated to return home. Pt fully indep without DME needs prior to admission, + driving. Reports limited assistance available at discharge but her son can check in on her intermittently after business hours. Strength symmetrical throughout, grossly 4+/5. Able to remove her  boots and don her tennis shoes with setupA via figure 4 technique. Sit<>stand with CGA to RW and ambulated to ortho gym with CGA and RW - cues for RW management and proximity to AD for safety. Performed car transfer with CGA via self selected lateral side stepping method. Ambulated from ortho gym to main rehab gym with CGA and no AD, gait slightly more unsteady with no RW - cues for widening BOS and general safety awareness. Able to navigate up/down x12 steps with CGA and bilateral hand rails, via reciprocal pattern. Performed BERG, scored 43/56. Score indicative of increased falls risk. She ambulated back to her room with CGA and no AD, requested to lay down due to busy day. Sit>supine with supervision with HOB flat. Remained supine in bed with needs in reach and bed alarm on.  Patient demonstrates increased fall risk as noted by score of 43/56 on Berg Balance Scale.  (<36= high risk for falls, close to 100%; 37-45 significant >80%; 46-51 moderate >50%; 52-55 lower >25%)  Instructed pt in results of PT evaluation as detailed above, PT POC, rehab potential, rehab goals, and discharge recommendations. Additionally discussed CIR's policies regarding fall safety and use of chair alarm and/or quick release belt. Pt verbalized understanding and in agreement. Will update pt's  family members as they become available.   Discharge Criteria: Patient will be discharged from PT if patient refuses treatment 3 consecutive times without medical reason, if treatment goals not met, if there is a change in medical status, if patient makes no progress towards goals or if patient is discharged from hospital.  The above assessment, treatment plan, treatment alternatives and goals were discussed and mutually agreed upon: by patient  Alger Simons PT, DPT 10/04/2020, 3:50 PM

## 2020-10-04 NOTE — Evaluation (Signed)
Speech Language Pathology Assessment and Plan  Patient Details  Name: Leslie [name] MRN: [id number] Date of Birth: 1926-11-11  SLP Diagnosis: Cognitive Impairments;Dysphagia  Rehab Potential: Excellent ELOS: 5-7 days    Today's Date: 10/04/2020 SLP Individual Time: 6754-4920 SLP Individual Time Calculation (min): 80 min   Hospital Problem: Principal Problem:   Embolic stroke ([place]) Active Problems:   Hypoalbuminemia due to protein-calorie malnutrition ([facility])   Chronic combined systolic and diastolic CHF (congestive heart failure) ([facility])   Benign essential HTN   Chronic low back pain without sciatica  Past Medical History:  Past Medical History:  Diagnosis Date  . Anxiety disorder   . Glaucoma   . Hearing loss   . HTN (hypertension)   . Hyperglycemia   . Hyperlipidemia   . Vitamin D deficiency    Past Surgical History:  Past Surgical History:  Procedure Laterality Date  . GLAUCOMA SURGERY  2013  . TONSILLECTOMY      Assessment / Plan / Recommendation Clinical Impression Patient is a 87 y.o.femalewith history of glaucoma, T2DM, Vitamin D deficiency, chronic back/neck pain (gets manipulation twice a month)who was admitted on 10/01/20 with slurred speech and facial droop after church service. CTA head/neck showed apparent occlusion of innominate vein possibly due to extensive compression and multiple right thyroid nodules. 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 2D echo showed severely depressed LVEF with EF 25% and akinesis of septum, inferior and apical wall and hypokinesis elsewhere.  Therapy evaluations completed with recommendations for CIR. Patient admitted 10/03/20.   SLP administered the [facility] Mental Status Examination (SLUMS) and patient scored  14/30 points with a score of 27 or above considered normal.  Patient demonstrated deficits in error awareness, short-term recall and problem solving. Initially, patient did not endorse any changes in cognitive functioning, however, patient did endorse that some of the subtests in the standardized assessment were more difficult than she thought they would be. However, patient's overall safety awareness appeared [facility] for all tasks. Due to patient's baseline level of independence, recommend skilled SLP intervention to maximize cognitive functioning and overall functional independence.  SLP also administered a BSE with intermittent, subtle throat clearing with multiple swallows noted across thin liquids and solid textures. Patient reports throat clearing with meals prior to admission, suspect due to normal changes of swallowing function associated with aging. Patient did demonstrate efficient mastication and complete oral clearance with solid textures. Recommend patient continue current diet of regular textures with thin liquids with f/u for tolerance. Patient educated regarding recommendations and verbalized understanding.    Skilled Therapeutic Interventions          Administered a cognitive-linguistic evaluation and BSE, please see above for details.   SLP Assessment  Patient will need skilled Speech Lanaguage Pathology Services during CIR admission    Recommendations  SLP Diet Recommendations: Age appropriate regular solids;Thin Liquid Administration via: Straw;Spoon Medication Administration: Whole meds with liquid Supervision: Patient able to self feed;Intermittent supervision to cue for compensatory strategies Compensations: Slow rate;Small sips/bites Postural Changes and/or Swallow Maneuvers: Seated upright 90 degrees Oral Care Recommendations: Oral care BID Patient destination: Home Follow up Recommendations:  (Intermittent supervision) Equipment Recommended: None recommended by SLP    SLP Frequency 3 to 5 out of 7 days   SLP Duration  SLP  Intensity  SLP Treatment/Interventions 5-7 days  Minumum of 1-2 x/day, 30 to 90 minutes  Cognitive remediation/compensation;Internal/external aids;Therapeutic Activities;Environmental controls;Dysphagia/aspiration precaution training;Cueing hierarchy;Functional tasks;Patient/family education    Pain Pain Assessment Pain Score: 0-No pain  Prior Functioning Type of Home: House  Lives With: Alone Available Help at Discharge: Family;Neighbor;Friend(s);Available PRN/intermittently Vocation: Retired  SLP Evaluation Cognition Overall Cognitive Status: Impaired/Different from baseline Arousal/Alertness: Awake/alert Orientation Level: Oriented to person;Oriented to place;Oriented to situation;Disoriented to time Attention: Sustained Sustained Attention: Appears intact Memory: Impaired Memory Impairment: Decreased recall of new information Awareness: Impaired Awareness Impairment: Emergent impairment Problem Solving: Impaired Problem Solving Impairment: Functional complex Safety/Judgment: Appears intact  Comprehension Auditory Comprehension Overall Auditory Comprehension: Appears within functional limits for tasks assessed Expression Expression Primary Mode of Expression: Verbal Verbal Expression Overall Verbal Expression: Appears within functional limits for tasks assessed Written Expression Dominant Hand: Right Oral Motor Oral Motor/Sensory Function Overall Oral Motor/Sensory Function: Mild impairment Motor Speech Overall Motor Speech: Appears within functional limits for tasks assessed Respiration: Within functional limits Phonation: Normal Resonance: Within functional limits Articulation: Within functional limitis Level of Impairment: Conversation Intelligibility: Intelligible Motor Planning: Witnin functional limits Motor Speech Errors: Not applicable  Care Tool Care Tool Cognition Expression of Ideas and Wants Expression of Ideas and Wants: Without difficulty  (complex and basic) - expresses complex messages without difficulty and with speech that is clear and easy to understand   Understanding Verbal and Non-Verbal Content Understanding Verbal and Non-Verbal Content: Understands (complex and basic) - clear comprehension without cues or repetitions   Memory/Recall Ability *first 3 days only Memory/Recall Ability *first 3 days only: Current season;That he or she is in a hospital/hospital unit    Bedside Swallowing Assessment General Date of Onset: 10/01/20 Previous Swallow Assessment: BSE: 3.20, recommended regular textures with thin liquids Diet Prior to this Study: Regular;Thin liquids Temperature Spikes Noted: No Respiratory Status: Room air History of Recent Intubation: No Behavior/Cognition: Alert;Cooperative;Pleasant mood Oral Cavity - Dentition: Adequate natural dentition Self-Feeding Abilities: Able to feed self Vision: Functional for self-feeding Patient Positioning: Upright in chair/Tumbleform Baseline Vocal Quality: Normal Volitional Cough: Strong Volitional Swallow: Able to elicit  Ice Chips Ice chips: Not tested Thin Liquid Thin Liquid: Impaired Presentation: Straw Pharyngeal  Phase Impairments: Multiple swallows;Throat Clearing - Delayed Nectar Thick Nectar Thick Liquid: Not tested Honey Thick Honey Thick Liquid: Not tested Puree Puree: Within functional limits Solid Solid: Impaired Pharyngeal Phase Impairments: Multiple swallows;Throat Clearing - Delayed BSE Assessment Risk for Aspiration Impact on safety and function: Mild aspiration risk  Short Term Goals: Week 1: SLP Short Term Goal 1 (Week 1): STGs=LTGs due to ELOS  Refer to Care Plan for Long Term Goals  Recommendations for other services: None   Discharge Criteria: Patient will be discharged from SLP if patient refuses treatment 3 consecutive times without medical reason, if treatment goals not met, if there is a change in medical status, if patient  makes no progress towards goals or if patient is discharged from hospital.  The above assessment, treatment plan, treatment alternatives and goals were discussed and mutually agreed upon: by patient  Alanna Storti 10/04/2020, 12:39 PM

## 2020-10-04 NOTE — Evaluation (Addendum)
Occupational Therapy Assessment and Plan  Patient Details  Name: Leslie [name] MRN: [id number] Date of Birth: Nov 25, 1926  OT Diagnosis: cognitive deficits and muscle weakness (generalized) Rehab Potential: Rehab Potential (ACUTE ONLY): Excellent ELOS: 7-9 days   Today's Date: 10/04/2020 OT Individual Time: 7829-5621 OT Individual Time Calculation (min): 60 min     Hospital Problem: Principal Problem:   Embolic stroke ([place]) Active Problems:   Hypoalbuminemia due to protein-calorie malnutrition (HCC)   Chronic combined systolic and diastolic CHF (congestive heart failure) (HCC)   Benign essential HTN   Chronic low back pain without sciatica   Past Medical History:  Past Medical History:  Diagnosis Date  . Anxiety disorder   . Glaucoma   . Hearing loss   . HTN (hypertension)   . Hyperglycemia   . Hyperlipidemia   . Vitamin D deficiency    Past Surgical History:  Past Surgical History:  Procedure Laterality Date  . GLAUCOMA SURGERY  2013  . TONSILLECTOMY      Assessment & Plan Clinical Impression:Leslie [name] is a 87 year old female with history of glaucoma, anxiety d/o, Vitamin D deficiency, chronic neck/back pain who was admitted on 10/01/2020 with slurred speech and facial droop after attending a church service.  CTA head/neck showed apparent occlusion of the innominate vein possibly due to extensive compression and multiple right thyroid nodules.  MRI brain showed several small/punctate infarcts in right frontoparietal vertex and small acute infarct left lateral cerebellum suggestive of embolic disease from heart or ascending aorta.  2D echo showed severely depressed LVEF with EF of 25% with akinesis of septum, inferior and apical wall and hypokinesis elsewhere.  BLE Dopplers were negative for DVT.  She was found to have evidence of UTI and was treated with fosfomycin. UCS ordered and showed >100,000 gram negative rods and pending.     Cardiology was consulted for input on  cardiomyopathy and recommended holding off on any goal-directed therapy however she did have episode of 17 beats asymptomatic NSVT last p.m. and "very low dose metoprolol" added per per Dr. Marlou Porch. Dr. Erlinda Hong recommended DOAC given embolic stroke in setting of low EF with follow-up TTE in 3 months and transition to ASA once EF greater than 35%.   Therapy evaluations completed revealing balance deficits with awareness of deficits.  CIR recommended due to functional decline.     Patient transferred to CIR on 10/03/2020 .    Patient currently requires supervision with basic self-care skills secondary to muscle weakness and decreased memory.  Prior to hospitalization, patient was fully independent and living alone.  Patient will benefit from skilled intervention to increase independence with basic self-care skills prior to discharge home independently.  Anticipate patient will require intermittent supervision and follow up home health.  OT - End of Session Activity Tolerance: Tolerates 30+ min activity without fatigue Endurance Deficit: No OT Assessment Rehab Potential (ACUTE ONLY): Excellent OT Patient demonstrates impairments in the following area(s): Balance;Cognition OT Basic ADL's Functional Problem(s): Dressing;Toileting;Bathing OT Advanced ADL's Functional Problem(s): Simple Meal Preparation;Light Housekeeping OT Transfers Functional Problem(s): Tub/Shower;Toilet OT Additional Impairment(s): None OT Plan OT Intensity: Minimum of 1-2 x/day, 45 to 90 minutes OT Frequency: 5 out of 7 days OT Duration/Estimated Length of Stay: 7-9 days OT Treatment/Interventions: Balance/vestibular training;Discharge planning;Cognitive remediation/compensation;Functional mobility training;Patient/family education;Psychosocial support;Neuromuscular re-education;Self Care/advanced ADL retraining;Therapeutic Exercise;UE/LE Strength taining/ROM;UE/LE Coordination activities OT Self Feeding Anticipated Outcome(s): no  goal, pt is independent OT Basic Self-Care Anticipated Outcome(s): Mod I OT Toileting Anticipated Outcome(s): Mod I  OT Bathroom Transfers Anticipated Outcome(s): Mod I OT Recommendation Patient destination: Home Follow Up Recommendations: Home health OT Equipment Recommended: To be determined   OT Evaluation Precautions/Restrictions  Precautions Precautions: Fall Restrictions Weight Bearing Restrictions: No   Pain Pain Assessment Pain Score: 0-No pain Home Living/Prior Functioning Home Living Family/patient expects to be discharged to:: Private residence Living Arrangements: Alone Available Help at Discharge: Family,Neighbor,Friend(s),Available PRN/intermittently Type of Home: House Home Access: Stairs to enter [employer] of Steps: 2 Entrance Stairs-Rails: Right Home Layout: Two level,Able to live on main level with bedroom/bathroom Alternate Level Stairs-Number of Steps: flight Bathroom Shower/Tub: Walk-in shower,Tub/shower unit [employer]: Handicapped height  Lives With: Alone Prior Function  Able to Take Stairs?: Yes Driving: Yes Vocation: Retired Comments: Neighbors bring food in a lot for her, and she warms it up in the microwave. Goes out to lunch almost daily. Drives. Vision Baseline Vision/History: Wears glasses Wears Glasses: At all times Patient Visual Report: No change from baseline Vision Assessment?: No apparent visual deficits Perception  Perception: Impaired Spatial Orientation: difficulty with clock draw test; to be evaluated further Praxis Praxis: Intact Cognition Overall Cognitive Status: Impaired/Different from baseline Arousal/Alertness: Awake/alert Orientation Level: Person;Place;Situation (hospital, but said Leslie [name]) Person: Oriented Place: Oriented (hospital, but said Leslie [name]) Situation: Oriented Year: 2022 (initially said 1922, then corrected herself) Month: March Day of Week: Incorrect (Sunday) Memory:  Impaired Memory Impairment: Decreased recall of new information Immediate Memory Recall: sock, blue, bed  Memory Recall Sock: Without Cue Memory Recall Blue: With Cue Memory Recall Bed: Without Cue Attention: Sustained Sustained Attention: Appears intact Awareness: Impaired Awareness Impairment: Emergent impairment Problem Solving: Impaired Problem Solving Impairment: Functional complex Safety/Judgment: Appears intact Sensation Sensation Light Touch: Impaired by gross assessment (pt reports a sensation of L hand tingling) Hot/Cold: Appears Intact Proprioception: Appears Intact Stereognosis: Not tested Coordination Gross Motor Movements are Fluid and Coordinated: Yes Fine Motor Movements are Fluid and Coordinated: Yes Motor  Motor Motor - Skilled Clinical Observations: generalized weakness, but pt reports "I feel like my regular old self!"  Trunk/Postural Assessment  Cervical Assessment Cervical Assessment: Exceptions to [facility] (rounded shoulders) Lumbar Assessment Lumbar Assessment: Exceptions to [facility] (anterior pelvic tilt) Postural Control Postural Control: Within Functional Limits  Balance Dynamic Sitting Balance Dynamic Sitting - Level of Assistance: 5: Stand by assistance Dynamic Standing Balance Dynamic Standing - Level of Assistance: 5: Stand by assistance (pt stood in the shower to wash herself with supervision) Extremity/Trunk Assessment RUE Assessment RUE Assessment: Within Functional Limits LUE Assessment LUE Assessment: Within Functional Limits  Care Tool Care Tool Self Care Eating    independent    Oral Care    Oral Care Assist Level: Supervision/Verbal cueing (standing at sink)    Bathing   Body parts bathed by patient: Right arm;Left arm;Chest;Abdomen;Front perineal area;Buttocks;Right upper leg;Left upper leg;Right lower leg;Left lower leg;Face     Assist Level: Supervision/Verbal cueing    Upper Body Dressing(including orthotics)   What is the  patient wearing?: Pull over shirt;Bra   Assist Level: Supervision/Verbal cueing    Lower Body Dressing (excluding footwear)   What is the patient wearing?: Underwear/pull up Assist for lower body dressing: Supervision/Verbal cueing    Putting on/Taking off footwear   What is the patient wearing?: Socks;Shoes Assist for footwear: Supervision/Verbal cueing       Care Tool Toileting Toileting activity   Assist for toileting: Supervision/Verbal cueing     Care Tool Bed Mobility Roll left and right activity   Roll left  and right assist level: Independent    Sit to lying activity   Sit to lying assist level: Supervision/Verbal cueing    Lying to sitting edge of bed activity   Lying to sitting edge of bed assist level: Supervision/Verbal cueing     Care Tool Transfers Sit to stand transfer   Sit to stand assist level: Supervision/Verbal cueing    Chair/bed transfer   Chair/bed transfer assist level: Contact Guard/Touching assist     Toilet transfer   Assist Level: Contact Guard/Touching assist     Care Tool Cognition Expression of Ideas and Wants Expression of Ideas and Wants: Without difficulty (complex and basic) - expresses complex messages without difficulty and with speech that is clear and easy to understand   Understanding Verbal and Non-Verbal Content Understanding Verbal and Non-Verbal Content: Understands (complex and basic) - clear comprehension without cues or repetitions   Memory/Recall Ability *first 3 days only Memory/Recall Ability *first 3 days only: Current season;That he or she is in a hospital/hospital unit    Refer to Care Plan for [place] 1 OT Short Term Goal 1 (Week 1): STGs = LTGs  Recommendations for other services: None    Skilled Therapeutic Intervention ADL ADL Eating: Independent Grooming: Supervision/safety Where Assessed-Grooming: Standing at sink Upper Body Bathing: Supervision/safety Where  Assessed-Upper Body Bathing: Shower Lower Body Bathing: Supervision/safety Where Assessed-Lower Body Bathing: Shower Upper Body Dressing: Supervision/safety Where Assessed-Upper Body Dressing: Chair Lower Body Dressing: Supervision/safety Where Assessed-Lower Body Dressing: Chair Toileting: Supervision/safety Where Assessed-Toileting: Glass blower/designer: Therapist, music Method: Ambulating Tub/Shower Transfer: Metallurgist Method: Optometrist: Shower seat with back;Grab bars;Walk in shower     Pt seen for initial evaluation and ADL training to include toileting, shower, dressing, standing at sink to complete oral and hair care.  Pt was able to get up from a flat bed, no rails and stand up with S. Maintained balance with resistance.  Ambulated in and out of bathroom and in room with CGA at her hips and no AD.  Supervision with all self care including standing in the shower.  Pt states she only heats up microwave meal of food provided by friends and neighbors.  Discussed no driving and the need for some intermittent S at home due to new challenges with her memory.  Pt resting in recliner with chair alarm on and all needs met.      Discharge Criteria: Patient will be discharged from OT if patient refuses treatment 3 consecutive times without medical reason, if treatment goals not met, if there is a change in medical status, if patient makes no progress towards goals or if patient is discharged from hospital.  The above assessment, treatment plan, treatment alternatives and goals were discussed and mutually agreed upon: by patient  [facility] 10/04/2020, 1:03 PM

## 2020-10-04 NOTE — Progress Notes (Signed)
Patient information reviewed and entered into eRehab System by Becky Akoni Parton, PPS coordinator. Information including medical coding, function ability, and quality indicators will be reviewed and updated through discharge.   

## 2020-10-04 NOTE — IPOC Note (Signed)
Individualized overall Plan of Care (IPOC) Patient Details Name: Leslie [name] MRN: [id number] DOB: 1926-11-04  Admitting Diagnosis: Embolic stroke [facility])  Hospital Problems: Principal Problem:   Embolic stroke [facility]) Active Problems:   Hypoalbuminemia due to protein-calorie malnutrition (HCC)   Chronic combined systolic and diastolic CHF (congestive heart failure) (HCC)   Benign essential HTN   Chronic low back pain without sciatica     Functional Problem List: Nursing Medication Management,Safety,Endurance,Bladder  PT Balance,Motor,Safety,Perception  OT Balance,Cognition  SLP    TR         Basic ADL's: OT Dressing,Toileting,Bathing     Advanced  ADL's: OT Simple Meal Preparation,Light Housekeeping     Transfers: PT Bed Mobility,Bed to Chair,Car  OT Tub/Shower,Toilet     Locomotion: PT Ambulation,Stairs     Additional Impairments: OT None  SLP Swallowing,Social Cognition   Problem Solving,Memory,Awareness  TR      Anticipated Outcomes Item Anticipated Outcome  Self Feeding no goal, pt is independent  Swallowing  Mod I   Basic self-care  Mod I  Toileting  Mod I   Bathroom Transfers Mod I  Bowel/Bladder  Manage bladder with mod I assist  Transfers  mod I  Locomotion  mod I  Communication     Cognition  Supervision  Pain  n/a  Safety/Judgment  Maintain safety wtih cues/reminders   Therapy Plan: PT Intensity: Minimum of 1-2 x/day ,45 to 90 minutes PT Frequency: 5 out of 7 days PT Duration Estimated Length of Stay: 5-7 days OT Intensity: Minimum of 1-2 x/day, 45 to 90 minutes OT Frequency: 5 out of 7 days OT Duration/Estimated Length of Stay: 7-9 days SLP Intensity: Minumum of 1-2 x/day, 30 to 90 minutes SLP Frequency: 3 to 5 out of 7 days SLP Duration/Estimated Length of Stay: 5-7 days    Team Interventions: Nursing Interventions Patient/Family Education,Bladder Management,Disease Management/Prevention,Medication [employer]  Planning  PT interventions Ambulation/gait training,Discharge planning,Functional mobility training,Psychosocial support,Therapeutic Activities,Visual/perceptual remediation/compensation,Wheelchair propulsion/positioning,Therapeutic Exercise,Skin care/wound management,Neuromuscular re-education,Disease management/prevention,Balance/vestibular training,Cognitive remediation/compensation,DME/adaptive equipment instruction,Pain management,Splinting/orthotics,UE/LE Strength taining/ROM,UE/LE Coordination activities,Stair training,Patient/family education,Community reintegration  OT Interventions Balance/vestibular training,Discharge planning,Cognitive remediation/compensation,Functional mobility training,Patient/family education,Psychosocial support,Neuromuscular re-education,Self Care/advanced ADL retraining,Therapeutic Exercise,UE/LE Strength taining/ROM,UE/LE Coordination activities  SLP Interventions Cognitive remediation/compensation,Internal/external aids,Therapeutic Activities,Environmental controls,Dysphagia/aspiration precaution training,Cueing hierarchy,Functional tasks,Patient/family education  TR Interventions    SW/CM Interventions Discharge Planning,Psychosocial Support,Patient/Family Education   Barriers to Discharge MD  Medical stability  Nursing Lack of/limited family support,Home environment Best boy Lived solo and driving PTA  PT Decreased caregiver support,Insurance for SNF coverage,Lack of/limited family support    OT      SLP      SW Lack of/limited family support Does not have 24/7 care   Team Discharge Planning: Destination: PT-Home ,OT- Home , SLP-Home Projected Follow-up: PT-Home health PT, OT-  Home health OT, SLP- (Intermittent supervision) Projected Equipment Needs: PT-To be determined, OT- To be determined, SLP-None recommended by SLP Equipment Details: PT- , OT-  Patient/family involved in discharge planning: PT- Patient,  OT-Patient, SLP-Patient  MD ELOS:  4-6 days. Medical Rehab Prognosis:  Good Assessment: Female with history of glaucoma, anxiety d/o, Vitamin D deficiency, chronic neck/back pain who was admitted on 10/01/2020 with slurred speech and facial droop after attending a church service.  CTA head/neck showed apparent occlusion of the innominate vein possibly due to extensive compression and multiple right thyroid nodules. MRI brain showed several small/punctate infarcts in right frontoparietal vertex and small acute infarct left lateral cerebellum suggestive of embolic disease from heart or ascending aorta.  2D echo  showed severely depressed LVEF with EF of 25% with akinesis of septum, inferior and apical wall and hypokinesis elsewhere.  BLE Dopplers were negative for DVT.  She was found to have evidence of UTI and was treated with fosfomycin. UCS ordered and showed >100,000 gram negative rods. Cardiology was consulted for input on cardiomyopathy and recommended holding off on any goal-directed therapy however she did have episode of 17 beats asymptomatic NSVT last p.m. and "very low dose metoprolol" added per per Dr. Anne Fu. Dr. Roda Shutters recommended DOAC given embolic stroke in setting of low EF with follow-up TTE in 3 months and transition to ASA once EF greater than 35%.  Patient with resulting functional deficits with mobility, self-care, endurance.  Will set goals for Mod I with PT/OT.   Due to the current state of emergency, patients may not be receiving their 3-hours of Medicare-mandated therapy.  See Team Conference Notes for weekly updates to the plan of care

## 2020-10-04 NOTE — Progress Notes (Signed)
Inpatient Rehabilitation Center Individual Statement of Services  Patient Name:  Leslie [name]  Date:  10/04/2020  Welcome to the Inpatient Rehabilitation Center.  Our goal is to provide you with an individualized program based on your diagnosis and situation, designed to meet your specific needs.  With this comprehensive rehabilitation program, you will be expected to participate in at least 3 hours of rehabilitation therapies Monday-Friday, with modified therapy programming on the weekends.  Your rehabilitation program will include the following services:  Physical Therapy (PT), Occupational Therapy (OT), Speech Therapy (ST), 24 hour per day rehabilitation nursing, Care Coordinator, Rehabilitation Medicine, Nutrition Services and Pharmacy Services  Weekly team conferences will be held on Wednesday to discuss your progress.  Your Inpatient Rehabilitation Care Coordinator will talk with you frequently to get your input and to update you on team discussions.  Team conferences with you and your family in attendance may also be held.  Expected length of stay: 7 days  Overall anticipated outcome: independent with device  Depending on your progress and recovery, your program may change. Your Inpatient Rehabilitation Care Coordinator will coordinate services and will keep you informed of any changes. Your Inpatient Rehabilitation Care Coordinator's name and contact numbers are listed  below.  The following services may also be recommended but are not provided by the Inpatient Rehabilitation Center:   Driving Evaluations  Home Health Rehabiltiation Services  Outpatient Rehabilitation Services    Arrangements will be made to provide these services after discharge if needed.  Arrangements include referral to agencies that provide these services.  Your insurance has been verified to be:  Medicare & [employer] Choice-supplement Your primary doctor is:  Lucky Cowboy  Pertinent information will be  shared with your doctor and your insurance company.  Inpatient Rehabilitation Care Coordinator:  Dossie Der, [place] [phone] or Luna Glasgow  Information discussed with and copy given to patient by: Lucy Chris, 10/04/2020, 9:56 AM

## 2020-10-04 NOTE — Progress Notes (Addendum)
Inpatient Rehabilitation Care Coordinator Assessment and Plan Patient Details  Name: Leslie [name] MRN: [id number] Date of Birth: [birth year]  Today's Date: 10/04/2020  Hospital Problems: Principal Problem:   Embolic stroke [facility])  Past Medical History:  Past Medical History:  Diagnosis Date  . Anxiety disorder   . Glaucoma   . Hearing loss   . HTN (hypertension)   . Hyperglycemia   . Hyperlipidemia   . Vitamin D deficiency    Past Surgical History:  Past Surgical History:  Procedure Laterality Date  . GLAUCOMA SURGERY  2013  . TONSILLECTOMY     Social History:  reports that she quit smoking about 41 years ago. She has never used smokeless tobacco. She reports that she does not drink alcohol and does not use drugs.  Family / Support Systems Marital Status: Widow/Widower Patient Roles: Parent Children: Arnold Long [address] [phone]-cell Raliegh Other Supports: neighbors are supportive Anticipated Caregiver: [name] and friends Ability/Limitations of Caregiver: [name] is retired and lives in [place] about a 20 [address] and pt's neighbors are involved Caregiver Availability: Intermittent Family Dynamics: Close with both son's and both come and visit pt. Pt's neighbors are supportive and bring her food during the week. Pt is very independent and plans to remain so  Social History Preferred language: English Religion: Lutheran Cultural Background: No issues Education: HS Read: Yes Write: Yes Employment Status: Retired Marine scientist Issues: No issues Guardian/Conservator: None-according to MD pt is capable of making her own decisions while here   Abuse/Neglect Abuse/Neglect Assessment Can Be Completed: Yes Physical Abuse: Denies Verbal Abuse: Denies Sexual Abuse: Denies Exploitation of patient/patient's resources: Denies Self-Neglect: Denies  Emotional Status Pt's affect, behavior and adjustment status:  Pt is motivated to regain her independence and feels she is getting there. She is already better now then when she first came into the hospital. Pt has always been independent and driven herself to where she wanted to go. Recent Psychosocial Issues: other health issues but were managed prior to admission on only one pill-Vitamin D PTA Psychiatric History: No history deferred depression screen due to pt coping appropriately and doing well. Will be a short length of stay Substance Abuse History: No issues  Patient / Family Perceptions, Expectations & Goals Pt/Family understanding of illness & functional limitations: Pt and son are able to explain her stroke and the deficits she came in with. She seems to have cleared and is doing remarkably well. Both have spoken with the MD's and feel they have a clear understanding of her plan going forward. Premorbid pt/family roles/activities: Mom, grandmother, great grandmother, home owner, church member, neighbor, Catering manager Anticipated changes in roles/activities/participation: resume Pt/family expectations/goals: Pt states: " I hope to get out of her soon, I feel like I am dong well."  Son states: " She is amazing always has been."  [employer]: None Premorbid Home Care/DME Agencies: Other (Comment) (rw, grab bars in bathroom, tub seat) Transportation available at discharge: Pt was driving PTA, son and neighbors can transport her  Discharge Planning Living Arrangements: Alone Support Systems: Children,Friends/neighbors,Church/faith community Type of Residence: Private residence Insurance Resources: [employer] (specify) Publishing rights manager) Financial Resources: Social Security Financial Screen Referred: No Living Expenses: Own Money Management: Patient Does the patient have any problems obtaining your medications?: No Home Management: Patient Patient/Family Preliminary Plans: Return home with son coming by to  check on her and her neighbors checking also. Pt hopes to be mod/i and be safe to be alone  like she always has been. Care Coordinator Barriers to Discharge: Lack of/limited family support Care Coordinator Barriers to Discharge Comments: Does not have 24/7 care Care Coordinator Anticipated Follow Up Needs: HH/OP  Clinical Impression Pleasant very independent lady who is doing remarkably well after having a CVA. Her two son's are involved and her closet one-Dale with check on her and provide transportation to appointments. Pt hopes to be able to drive again. Aware team evaluating and setting goals and ELOS. Will be short length of stay.  Lucy Chris 10/04/2020, 9:53 AM

## 2020-10-04 NOTE — Patient Care Conference (Signed)
Inpatient RehabilitationTeam Conference and Plan of Care Update Date: 10/04/2020   Time: 11:00 AM    Patient Name: Leslie [name]      Medical Record Number: [id number]  Date of Birth: 09/03/1926 Sex: Female         Room/Bed: 4M04C/4M04C-01 Payor Info: Payor: MEDICARE / Plan: MEDICARE PART A AND B / Product Type: *No Product type* /    Admit Date/Time:  10/03/2020  3:00 PM  Primary Diagnosis:  Embolic stroke [facility])  Hospital Problems: Principal Problem:   Embolic stroke (HCC) Active Problems:   Hypoalbuminemia due to protein-calorie malnutrition (HCC)   Chronic combined systolic and diastolic CHF (congestive heart failure) (HCC)   Benign essential HTN   Chronic low back pain without sciatica    Expected Discharge Date: Expected Discharge Date:  (Initial evals pending;)  Team Members Present: Physician leading conference: Dr. Maryla Morrow Care Coodinator Present: Chana Bode, RN, BSN, CRRN;Becky Dupree, LCSW Nurse Present: Chana Bode, RN PT Present: Wynelle Link, PT OT Present: Primitivo Gauze, OT SLP Present: Feliberto Gottron, SLP PPS Coordinator present : Edson Snowball, PT     Current Status/Progress Goal Weekly Team Focus  Bowel/Bladder   Continent x 2.  Maintain continence.  Assess toileting needs every round.   Swallow/Nutrition/ Hydration   Eval pending         ADL's             Mobility   Eval pending         Communication   Eval Pending         Safety/Cognition/ Behavioral Observations  Eval Pending         Pain   No pain issues.  To continue to be free of pain.  Assess pain q shift and prn.   Skin   No skin issues.  To continue to be free of breakdown.  Assess skin q shift.     Discharge Planning:      Team Discussion: HTN and HF controlled; monitoring daily weights.  Patient on target to meet rehab goals: yes, currently supervision for self care and CGA for ambulation without assistive device.   *See Care Plan and progress notes for long and  short-term goals.   Revisions to Treatment Plan:   Teaching Needs: Safety, medications, secondary stroke risk management, etc. Safety boundaries for activities  Current Barriers to Discharge: Decreased caregiver support and Home enviroment access/layout  Lives alone, son works  Designer, fashion/clothing to Barriers: Neighbors to assist with care needs Family education    Medical Summary Current Status: Functional deficits and left hemiparesis secondary to bi-cerebral cardio-embolic infarcts  Barriers to Discharge: Decreased family/caregiver support;Medical stability   Possible Resolutions to [employer] Focus: Therapies, optimize BP meds/pain meds   Continued Need for Acute Rehabilitation Level of Care: The patient requires daily medical management by a physician with specialized training in physical medicine and rehabilitation for the following reasons: Direction of a multidisciplinary physical rehabilitation program to maximize functional independence : Yes Medical management of patient stability for increased activity during participation in an intensive rehabilitation regime.: Yes Analysis of laboratory values and/or radiology reports with any subsequent need for medication adjustment and/or medical intervention. : Yes   I attest that I was present, lead the team conference, and concur with the assessment and plan of the team.   Chana Bode B 10/04/2020, 3:03 PM

## 2020-10-05 DIAGNOSIS — D62 Acute posthemorrhagic anemia: Secondary | ICD-10-CM

## 2020-10-05 DIAGNOSIS — K5901 Slow transit constipation: Secondary | ICD-10-CM

## 2020-10-05 NOTE — Progress Notes (Signed)
Physical Therapy Session Note  Patient Details  Name: Leslie [name] MRN: [id number] Date of Birth: [birth year]  Today's Date: 10/05/2020 PT Individual Time: 1500-1530 PT Individual Time Calculation (min): 30 min   Short Term Goals: Week 1:  PT Short Term Goal 1 (Week 1): STG = LTG due to ELOS  Skilled Therapeutic Interventions/Progress Updates:    Patient standing in room with chair alarm going off and son and grandson in the room. Reports didn't know she couldn't get up on her own.  Pt educated need for assistance when getting up.  Patient ambulated to therapy gym x 150' without device with close S.  Patient negotiated 4 steps with 2 rails x 1 with CGA, then with   R rail only x 1 with CGA to close S as reports home entry steps have only R rail.  Patient seated for education review of FAST for stroke warning signs and discussed risk factors and modification including need to check BP at home as well as continue to follow MD specifications for management of any other medical issues.  Also discussed fall risk and potential improvements with modifications including footwear, lighting, keeping pathways clear and using non-skid surface in shower.  Patient also may benefit form cane so practiced ambulating with SPC x 75' with close S to CGA pt stepping without consistency in progressing cane.  Discussed using on curb and performed curb step with cane and S x 4 reps.  Patient ambulated to room with cane x 150' with A for progressing each step and education on arm swing happening every step as well.  Patient assisted to recliner and left with alarm active and all needs in reach.  Re-oriented pt needing to call for assistance if needs to get up and she repeated this as well.    Therapy Documentation Precautions:  Precautions Precautions: Fall Restrictions Weight Bearing Restrictions: No  Pain: Pain Assessment Pain Score: 0-No pain   Therapy/Group: Individual Therapy  Elray Mcgregor [place],  [place] 10/05/2020, 3:22 PM

## 2020-10-05 NOTE — Progress Notes (Signed)
Physical Therapy Session Note  Patient Details  Name: Leslie [name] MRN: [id number] Date of Birth: 11/12/1926  Today's Date: 10/05/2020 PT Individual Time: 0930-1000 PT Individual Time Calculation (min): 30 min   Short Term Goals: Week 1:  PT Short Term Goal 1 (Week 1): STG = LTG due to ELOS  Skilled Therapeutic Interventions/Progress Updates:    Patient in supine and agreeable to PT.  Requesting to toilet so performed supine to sit with S and sit to stand CGA ambulating to bathroom and then to sink in room for hand washing all with min a to CGA with HHA part of the trip, then pt reaching for door facing, footboard, etc.  Patient seated EOB for vestibular assessment as noted below.  Educated in FAST for signs and symptoms of stroke as unable to recall symptoms that indicated she had a stroke.  Also performed standing head turns x 5, then head nods x 5 and eyes closed x 30 sec all without UE support with close S.  Patient returned to supine and left with all needs in reach and bed alarm active.    Vestibular Assessment - 10/05/20 0001      Symptom Behavior   Subjective history of current problem Patient denies current dizziness, hearing or vision issues, but does report about 6 years ago history of positional vertigo she got some exercises for and got better.  Also doesn't really know what symptoms she had that made friends think she had a stroke.    Type of Dizziness  Not applicable   only some wooziness coming up from R modified hallpike   Frequency of Dizziness n/a    Duration of Dizziness n/a    Aggravating Factors Comment   sitting up from R modified hallpike   Relieving Factors Not applicable      Oculomotor Exam   Oculomotor Alignment Normal    Ocular ROM WNL    Spontaneous Absent    Gaze-induced  Absent    Head shaking Horizontal Absent    Smooth Pursuits Intact    Saccades Slow;Undershoots      Oculomotor Exam-Fixation Suppressed    Left Head Impulse positive for refixation  saccade    Right Head Impulse WNL      Vestibulo-Ocular Reflex   VOR 1 Head Only (x 1 viewing) performed with vertical and horizontal head movements without symptoms    VOR Cancellation Normal      Auditory   Comments intact to scratch test and equal bilateral      Positional Testing   Sidelying Test Sidelying Right;Sidelying Left      Sidelying Right   Sidelying Right Duration 1 minute    Sidelying Right Symptoms No nystagmus      Sidelying Left   Sidelying Left Duration 1 minute    Sidelying Left Symptoms No nystagmus           Therapy Documentation Precautions:  Precautions Precautions: Fall Restrictions Weight Bearing Restrictions: No   Therapy/Group: Individual Therapy  Elray Mcgregor  Sheran Lawless, PT 10/05/2020, 12:47 PM

## 2020-10-05 NOTE — Progress Notes (Signed)
Speech Language Pathology Daily Session Note  Patient Details  Name: Leslie [name] MRN: [id number] Date of Birth: 12-May-1927  Today's Date: 10/05/2020 SLP Individual Time: 8295-6213 SLP Individual Time Calculation (min): 40 min  Short Term Goals: Week 1: SLP Short Term Goal 1 (Week 1): STGs=LTGs due to ELOS  Skilled Therapeutic Interventions: Skilled treatment session focused on cognitive goals. SLP facilitated session by providing overall Max A verbal cues for use of an external memory aid for recall of her current medications and their functions. Patient reported she was familiar with using a pill box as she had to organize one for her husband. However, Max A verbal cues were needed for problem solving and overall error awareness while organizing a BID pill box. Patient left upright in bed with alarm on and all needs within reach. Continue with current plan of care.      Pain No/Denies Pain   Therapy/Group: Individual Therapy  Chrisotpher Rivero 10/05/2020, 11:56 AM

## 2020-10-05 NOTE — Progress Notes (Signed)
Occupational Therapy Session Note  Patient Details  Name: Leslie [name] MRN: [id number] Date of Birth: Dec 08, 1926  Today's Date: 10/05/2020 OT Individual Time: 1612-2400 OT Individual Time Calculation (min): 40 min    Short Term Goals: Week 1:  OT Short Term Goal 1 (Week 1): STGs = LTGs  Skilled Therapeutic Interventions/Progress Updates:      Pt seen for BADL retraining of toileting, showering, and dressing with a focus on balance.  Pt's son and grandson present in room. Pt completed all self care at a supervision level in the bathroom to maintain her privacy. After pt completed self care, pt sat in her recliner and did family education to explain what concerns her therapy team have with her cognition and why we are recommending 24/7 care. Pt's son states he understands. Pt states she is ready to go home tomorrow! Pt will benefit from a few more days of therapy to work on balance and cognition. Pt resting in wc with alarm on and all needs met.    Therapy Documentation Precautions:  Precautions Precautions: Fall Restrictions Weight Bearing Restrictions: No Therapy Vitals Temp: 98 F (36.7 C) Temp Source: Oral Pulse Rate: 86 Resp: 16 BP: 119/72 Patient Position (if appropriate): Lying Oxygen Therapy SpO2: 100 % O2 Device: Room Air Pain: Pain Assessment Pain Score: 0-No pain ADL: ADL Eating: Independent Grooming: Supervision/safety Where Assessed-Grooming: Standing at sink Upper Body Bathing: Supervision/safety Where Assessed-Upper Body Bathing: Shower Lower Body Bathing: Supervision/safety Where Assessed-Lower Body Bathing: Shower Upper Body Dressing: Supervision/safety Where Assessed-Upper Body Dressing: Chair Lower Body Dressing: Supervision/safety Where Assessed-Lower Body Dressing: Chair Toileting: Supervision/safety Where Assessed-Toileting: Glass blower/designer: Therapist, music Method: Ambulating Tub/Shower Transfer: Museum/gallery curator Method: Optometrist: Shower seat with back,Grab bars,Walk in shower  Therapy/Group: Individual Therapy  [place] 10/05/2020, 4:33 PM

## 2020-10-05 NOTE — Progress Notes (Signed)
[place] PHYSICAL MEDICINE & REHABILITATION PROGRESS NOTE  Subjective/Complaints: Patient seen sitting up in bed this AM, working with therapies.  She states she slept well overnight.  She notes she had a BM yesterday.    ROS: Denies CP, SOB, N/V/D  Objective: Vital Signs: Blood pressure 118/80, pulse 90, temperature 98.6 F (37 C), temperature source Oral, resp. rate 17, height 5\' 5"  (1.651 m), weight 48.9 kg, SpO2 98 %. No results found. Recent Labs    10/03/20 0353 10/04/20 0535  WBC 5.3 4.3  HGB 11.3* 11.3*  HCT 34.0* 35.3*  PLT 409* 421*   Recent Labs    10/03/20 0353 10/04/20 0535  NA 137 138  K 3.5 4.8  CL 104 107  CO2 27 28  GLUCOSE 93 97  BUN 17 12  CREATININE 0.84 0.78  CALCIUM 9.3 9.2    Intake/Output Summary (Last 24 hours) at 10/05/2020 0935 Last data filed at 10/05/2020 0729 Gross per 24 hour  Intake 580 ml  Output --  Net 580 ml        Physical Exam: BP 118/80 (BP Location: Left Arm)   Pulse 90   Temp 98.6 F (37 C) (Oral)   Resp 17   Ht 5\' 5"  (1.651 m)   Wt 48.9 kg   SpO2 98%   BMI 17.94 kg/m   Constitutional: No distress . Vital signs reviewed. HENT: Normocephalic.  Atraumatic. Eyes: EOMI. No discharge. Cardiovascular: No JVD.  RRR. Respiratory: Normal effort.  No stridor.  Bilateral clear to auscultation. GI: Non-distended.  BS +. Skin: Warm and dry.  Intact. Psych: Normal mood.  Normal behavior. Musc: No edema in extremities.  No tenderness in extremities. Neuro: Alert HOH Motor: LUE: 4+/5 proximal to distal, unchanged LLE: 4+/5 proximal to distal, unchanged Sensation diminished to light touch left hand  Assessment/Plan: 1. Functional deficits which require 3+ hours per day of interdisciplinary therapy in a comprehensive inpatient rehab setting.  Physiatrist is providing close team supervision and 24 hour management of active medical problems listed below.  Physiatrist and rehab team continue to assess barriers to  discharge/monitor patient progress toward functional and medical goals   Care Tool:  Bathing    Body parts bathed by patient: Right arm,Left arm,Chest,Abdomen,Front perineal area,Buttocks,Right upper leg,Left upper leg,Right lower leg,Left lower leg,Face         Bathing assist Assist Level: Supervision/Verbal cueing     Upper Body Dressing/Undressing Upper body dressing   What is the patient wearing?: Dress    Upper body assist Assist Level: Supervision/Verbal cueing    Lower Body Dressing/Undressing Lower body dressing      What is the patient wearing?: Underwear/pull up     Lower body assist Assist for lower body dressing: Supervision/Verbal cueing     Toileting Toileting    Toileting assist Assist for toileting: Supervision/Verbal cueing     Transfers Chair/bed transfer  Transfers assist     Chair/bed transfer assist level: Contact Guard/Touching assist     Locomotion Ambulation   Ambulation assist      Assist level: Contact Guard/Touching assist Assistive device: No Device Max distance: 172ft   Walk 10 feet activity   Assist     Assist level: Contact Guard/Touching assist Assistive device: No Device   Walk 50 feet activity   Assist    Assist level: Contact Guard/Touching assist Assistive device: No Device    Walk 150 feet activity   Assist    Assist level: Contact Guard/Touching assist Assistive device:  No Device    Walk 10 feet on uneven surface  activity   Assist Walk 10 feet on uneven surfaces activity did not occur: Safety/medical concerns         Wheelchair     Assist Will patient use wheelchair at discharge?: No             Wheelchair 50 feet with 2 turns activity    Assist            Wheelchair 150 feet activity     Assist           Medical Problem List and Plan: 1.Functional deficits and 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  Antithrombotics: -DVT/anticoagulation:Pharmaceutical:Eliquis -antiplatelet therapy: N/A 3.Chronic neck/back painPain Management:  Controlled on 3/23             Monitor with increased exertion 4. Mood:LCSW to follow for evaluation and support. -antipsychotic agents: N/A 5. Neuropsych: This patientiscapable of making decisions on herown behalf. 6. Skin/Wound Care:Routine pressure relief measures. 7. Fluids/Electrolytes/Nutrition:Monitor I/Os. 8. HTN: Monitor BP tid--avoid hypoperfusion.  Controlled on 3/23            Monitor with increased mobilitty  9. Cardiomyopathy/combined CHF/Asymptomatic episode NSVT: Low dose BB added on 03/22 per Dr. Alveta Heimlich. --monitor for symptoms with therapy activities.  --conservative management and follow up on outpatient basis.  Filed Weights   10/03/20 1513  Weight: 48.9 kg  10. Prediabetes: Hgb A1C-5.8  Controlled on 3/23  Monitor with increased mobility  11. Gram negative UTI: Treated with dose of fosfomycin on 03/21             Continue to monitor 12. Anxiety d/o: Used Xanax prn at nights for sleep 13. Hypoalbuminemia  Supplement initiated on 3/22 14. Acute blood loss anemia  Hb 11.3 on 3/23  Continue to monitor 15. Slow transit constipation  Bowel meds increased  LOS: 2 days A FACE TO FACE EVALUATION WAS PERFORMED  Leslie [name] Leslie [name] 10/05/2020, 9:35 AM

## 2020-10-06 NOTE — Progress Notes (Signed)
Speech Language Pathology Daily Session Note  Patient Details  Name: Leslie [name] MRN: [id number] Date of Birth: 1926-12-18  Today's Date: 10/06/2020 SLP Individual Time: 1345-1430 SLP Individual Time Calculation (min): 45 min  Short Term Goals: Week 1: SLP Short Term Goal 1 (Week 1): STGs=LTGs due to ELOS  Skilled Therapeutic Interventions: Skilled treatment session focused on cognitive goals. SLP facilitated session by providing extra time for patient to complete a mildly complex calendar/appointment making task and for a basic money management task. Patient ambulated back to her room with hand held assist. Patient left upright in bed with alarm on and all needs within reach. Continue with current plan of care.      Pain Pain Assessment Pain Score: 0-No pain  Therapy/Group: Individual Therapy  Marijean Montanye 10/06/2020, 3:31 PM

## 2020-10-06 NOTE — Progress Notes (Signed)
Occupational Therapy Session Note  Patient Details  Name: Leslie [name] MRN: [id number] Date of Birth: July 01, 1927  Today's Date: 10/06/2020 OT Individual Time: 0915-1000 OT Individual Time Calculation (min): 45 min    Short Term Goals: Week 1:  OT Short Term Goal 1 (Week 1): STGs = LTGs  Skilled Therapeutic Interventions/Progress Updates:    OT session focused on ADL retraining, simple meal prep, and functional mobility. Pt received supine in bed requesting to sponge bathe at sink. Pt completed bathing, dressing, oral care, and toileting with supervision sit<>stand level. Pt retrieved all clothing items prior to bath without cues. Ambulated to ADL apartment to complete simple meal prep task of heating water on the stove. Pt completed with distant supervision, showing recall of turning off stove. Pt returned to room and left sitting in recliner with bed alarm on and all needs in reach.   Therapy Documentation Precautions:  Precautions Precautions: Fall Restrictions Weight Bearing Restrictions: No General:   Vital Signs:   Pain:   ADL: ADL Eating: Independent Grooming: Supervision/safety Where Assessed-Grooming: Standing at sink Upper Body Bathing: Supervision/safety Where Assessed-Upper Body Bathing: Shower Lower Body Bathing: Supervision/safety Where Assessed-Lower Body Bathing: Shower Upper Body Dressing: Supervision/safety Where Assessed-Upper Body Dressing: Chair Lower Body Dressing: Supervision/safety Where Assessed-Lower Body Dressing: Chair Toileting: Supervision/safety Where Assessed-Toileting: Teacher, adult education: Furniture conservator/restorer Method: Ambulating Tub/Shower Transfer: Scientific laboratory technician Method: Ship broker: Shower seat with back,Grab bars,Walk in Neurosurgeon    Praxis   Exercises:   Other Treatments:     Therapy/Group: Individual Therapy  Daneil Dan 10/06/2020, 10:03 AM

## 2020-10-06 NOTE — Progress Notes (Signed)
Patient ID: Leslie [name], female   DOB: 14-Nov-1926, 87 y.o.   MRN: [id number]  Met with pt to make sure aware of team's recommendation of supervision and discharge 3/29. Left message for son to come in Monday @ 1:00 to go to speech therapy with pt. Needs to see pt's cognitive deficits. Will work toward discharge 3/29. Await son's return call.

## 2020-10-06 NOTE — Progress Notes (Signed)
Occupational Therapy Session Note  Patient Details  Name: Leslie [name] MRN: [id number] Date of Birth: 1927/02/23  Today's Date: 10/06/2020 OT Individual Time: 1300-1345 OT Individual Time Calculation (min): 45 min    Short Term Goals: Week 1:  OT Short Term Goal 1 (Week 1): STGs = LTGs  Skilled Therapeutic Interventions/Progress Updates:    Pt received in bed awake and ready for therapy. Pt ambulated with HHA to therapy room to work on a variety of balance challenges, visual tracking, and cognitive activities.  Pt initially worked on several tracking and sequencing activities with cues to follow directions but good visual tracking and scanning to find items on board. She had the most difficulty with memory sequencing where she had to memorize a sequence of letters or figures.  She only had 30% accuracy.  Stood with S for all activities.  She then worked on a Teacher, music in standing with having to reach overhead to place cards on a board.  Good balance control and no difficulty matching cards.  Worked on dynamic balance with reaching to floor to pick up cones placed around room.  Pt able to do so with no dizziness and very close S.   Pt then ambulated down hallway with HHA only to go to speech therapy office. While waiting for therapist to arrive for next session, pt discussed her local friends that bring her meals and what they bring her.  Pt has a great support system.     Therapy Documentation Precautions:  Precautions Precautions: Fall Restrictions Weight Bearing Restrictions: No    Therapy Vitals Temp: 97.6 F (36.4 C) Temp Source: Oral Pulse Rate: 77 Resp: 16 BP: 109/68 Patient Position (if appropriate): Lying Oxygen Therapy SpO2: 99 % O2 Device: Room Air Pain: Pain Assessment Pain Score: 0-No pain ADL: ADL Eating: Independent Grooming: Supervision/safety Where Assessed-Grooming: Standing at sink Upper Body Bathing: Supervision/safety Where Assessed-Upper  Body Bathing: Shower Lower Body Bathing: Supervision/safety Where Assessed-Lower Body Bathing: Shower Upper Body Dressing: Supervision/safety Where Assessed-Upper Body Dressing: Chair Lower Body Dressing: Supervision/safety Where Assessed-Lower Body Dressing: Chair Toileting: Supervision/safety Where Assessed-Toileting: Teacher, adult education: Furniture conservator/restorer Method: Ambulating Tub/Shower Transfer: Scientific laboratory technician Method: Ship broker: Shower seat with back,Grab bars,Walk in shower    Therapy/Group: Individual Therapy  SAGUIER,JULIA 10/06/2020, 2:46 PM

## 2020-10-06 NOTE — Progress Notes (Signed)
[place] PHYSICAL MEDICINE & REHABILITATION PROGRESS NOTE  Subjective/Complaints: Patient seen sitting up in bed this morning.  He states he slept well overnight.  He states he wants to go home.  ROS: Denies CP, SOB, N/V/D  Objective: Vital Signs: Blood pressure 104/69, pulse 77, temperature 98.5 F (36.9 C), temperature source Oral, resp. rate 18, height 5\' 5"  (1.651 m), weight 48.9 kg, SpO2 96 %. No results found. Recent Labs    10/04/20 0535  WBC 4.3  HGB 11.3*  HCT 35.3*  PLT 421*   Recent Labs    10/04/20 0535  NA 138  K 4.8  CL 107  CO2 28  GLUCOSE 97  BUN 12  CREATININE 0.78  CALCIUM 9.2    Intake/Output Summary (Last 24 hours) at 10/06/2020 0915 Last data filed at 10/05/2020 1817 Gross per 24 hour  Intake 240 ml  Output --  Net 240 ml        Physical Exam: BP 104/69 (BP Location: Right Arm)   Pulse 77   Temp 98.5 F (36.9 C) (Oral)   Resp 18   Ht 5\' 5"  (1.651 m)   Wt 48.9 kg   SpO2 96%   BMI 17.94 kg/m   Constitutional: No distress . Vital signs reviewed. HENT: Normocephalic.  Atraumatic. Eyes: EOMI. No discharge. Cardiovascular: No JVD.  RRR. Respiratory: Normal effort.  No stridor.  Bilateral clear to auscultation. GI: Non-distended.  BS +. Skin: Warm and dry.  Intact. Psych: Normal mood.  Normal behavior. Musc: No edema in extremities.  No tenderness in extremities. Neuro: Alert HOH Motor: LUE: 4+/5 proximal to distal, stable LLE: 4+/5 proximal to distal, stable Sensation diminished to light touch left hand  Assessment/Plan: 1. Functional deficits which require 3+ hours per day of interdisciplinary therapy in a comprehensive inpatient rehab setting.  Physiatrist is providing close team supervision and 24 hour management of active medical problems listed below.  Physiatrist and rehab team continue to assess barriers to discharge/monitor patient progress toward functional and medical goals   Care Tool:  Bathing    Body parts  bathed by patient: Right arm,Left arm,Chest,Abdomen,Front perineal area,Buttocks,Right upper leg,Left upper leg,Right lower leg,Left lower leg,Face         Bathing assist Assist Level: Supervision/Verbal cueing     Upper Body Dressing/Undressing Upper body dressing   What is the patient wearing?: Pull over shirt    Upper body assist Assist Level: Supervision/Verbal cueing    Lower Body Dressing/Undressing Lower body dressing      What is the patient wearing?: Underwear/pull up     Lower body assist Assist for lower body dressing: Supervision/Verbal cueing     Toileting Toileting    Toileting assist Assist for toileting: Supervision/Verbal cueing     Transfers Chair/bed transfer  Transfers assist     Chair/bed transfer assist level: Contact Guard/Touching assist     Locomotion Ambulation   Ambulation assist      Assist level: Minimal Assistance - Patient > 75% Assistive device: Hand held assist Max distance: 20'   Walk 10 feet activity   Assist     Assist level: Minimal Assistance - Patient > 75% Assistive device: Hand held assist   Walk 50 feet activity   Assist    Assist level: Contact Guard/Touching assist Assistive device: No Device    Walk 150 feet activity   Assist    Assist level: Contact Guard/Touching assist Assistive device: No Device    Walk 10 feet on uneven surface  activity   Assist Walk 10 feet on uneven surfaces activity did not occur: Safety/medical concerns         Wheelchair     Assist Will patient use wheelchair at discharge?: No             Wheelchair 50 feet with 2 turns activity    Assist            Wheelchair 150 feet activity     Assist           Medical Problem List and Plan: 1.Functional deficits and 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  Antithrombotics: -DVT/anticoagulation:Pharmaceutical:Eliquis -antiplatelet therapy: N/A 3.Chronic neck/back painPain Management:  Controlled on 3/25             Monitor with increased exertion 4. Mood:LCSW to follow for evaluation and support. -antipsychotic agents: N/A 5. Neuropsych: This patientiscapable of making decisions on herown behalf. 6. Skin/Wound Care:Routine pressure relief measures. 7. Fluids/Electrolytes/Nutrition:Monitor I/Os. 8. HTN: Monitor BP tid--avoid hypoperfusion.  Home Toprolol 25 restarted  Controlled on 3/25            Monitor with increased mobilitty  9. Cardiomyopathy/combined CHF/Asymptomatic episode NSVT: Low dose BB added on 03/22 per Dr. Alveta Heimlich. --monitor for symptoms with therapy activities.  --conservative management and follow up on outpatient basis.   Daily weights ordered Filed Weights   10/03/20 1513  Weight: 48.9 kg  10. Prediabetes: Hgb A1C-5.8  Controlled on 3/23  Monitor with increased mobility  11. Gram negative UTI: Treated with dose of fosfomycin on 03/21             Continue to monitor 12. Anxiety d/o: Used Xanax prn at nights for sleep 13. Hypoalbuminemia  Supplement initiated on 3/22 14. Acute blood loss anemia  Hb 11.3 on 3/23  Continue to monitor 15. Slow transit constipation  Bowel meds increased  Improving  LOS: 3 days A FACE TO FACE EVALUATION WAS PERFORMED  Darrelyn Morro Karis Juba 10/06/2020, 9:15 AM

## 2020-10-06 NOTE — Progress Notes (Signed)
Physical Therapy Session Note  Patient Details  Name: GALILEAH PIGGEE MRN: [id number] Date of Birth: 03/06/1927  Today's Date: 10/06/2020 PT Individual Time: 1445-1530 PT Individual Time Calculation (min): 45 min   Short Term Goals: Week 1:  PT Short Term Goal 1 (Week 1): STG = LTG due to ELOS  Skilled Therapeutic Interventions/Progress Updates: Pt presents supine in bed and agreeable to therapy.  Pt transfers supine to sit w/ supervision, managing covers.  Pt able to put on shoes w/ set-up.  Pt amb 200+ to Dayroom w/ CGA (touching pt approx. 20% of time and w/o AD.  Pt had no LOB.  Verbal cues for UE swing.  Pt performed Nu-step at Level 4 x 5' x 2 trials w/o c/o.  Pt amb100' and then 100' to main gym.  Pt performed toe taps to 6" platform w/o LOB or UE support.  Pt performed step-ups and over to 6" platform w/ wlak before and afet.  Pt amb to stairwell and performed 11 steps w/ 1 rail (r side when ascending) and CGA, reciprocal technique.  Pt amb back to room approx. 110' w/o AD and CGA.  Pt remained sitting in recliner to visit w/ family.  Seat alarm on and all needs in reach, family present and awar of seat alarm.     Therapy Documentation Precautions:  Precautions Precautions: Fall Restrictions Weight Bearing Restrictions: No General:   Vital Signs: Therapy Vitals Temp: 97.6 F (36.4 C) Temp Source: Oral Pulse Rate: 77 Resp: 16 BP: 109/68 Patient Position (if appropriate): Lying Oxygen Therapy SpO2: 99 % O2 Device: Room Air Pain:0/10 Pain Assessment Pain Score: 0-No pain Mobility:       Therapy/Group: Individual Therapy  Lucio Edward 10/06/2020, 4:11 PM

## 2020-10-07 NOTE — Progress Notes (Addendum)
Physical Therapy Session Note  Patient Details  Name: Leslie [name] MRN: [id number] Date of Birth: Dec 21, 1926  Today's Date: 10/07/2020 PT Individual Time: 1035-1105; 1300-1330, 1500-1530 PT Individual Time Calculation (min): 30 min , 30 min, 30 min  Short Term Goals: Week 1:  PT Short Term Goal 1 (Week 1): STG = LTG due to ELOS  Skilled Therapeutic Interventions/Progress Updates:  tx 1:  Pt resting in bed.  She was receptive to a change in her schedule, requested by PT.  She denied pain.  In flat bed, no rails, pt came to sitting EOB with supervsion. Sit> stand without AD, CGA.  Gait in room.to/from BR, CGA.  Toilet transfer with CGA for continent voiding.  Gait training without AD to/from gym, no AD, CGA.    neuromuscular re-education via forced use, multimodal cues and demo, in sitting for: R/L ankle pumps; for sustained stretch bil heel cords, standing with forefeet on blue wedge, no UE support x 1 minute statically, x 1 minute with altrernating shoulder flexion.   Standing on Aire[employer] mat x 1 minute stationary, and marching with CGa for occasional LOB backwards.   Gait to return to room, kicking Yoga block for balance challenge, with LOB requiring max assist to recover, when kicking initially with either foot.  At end of session, pt requested getting back to bed, as her LB hurt.  Sit> supine iwht supervision.  Needs left at hand, and bed alarm set.  tx 2:  Pt resting in bed.  She denied pain.  Gait to BR as above.  Toilet transfer using wall bar.   Pt continent of B and B; supervision for peri care and clothing mgt.  Hand washing at sink in standing, supervision.  neuromuscular re-education via demo for standing with bil UE support: 15 x 1 each- R/L hip abduction with focus on neutral hip rotation, mini squats, heel/toe raises, hamstring curls.  No LOB.  At end of session, pt seated in recliner with needs at hand and seat pad alarm set.    tx 3:  Pt resting in recliner.  She  denied pain.  Gait in room as above to go to BR; toilet transfer with supervision, using wall bar, for continent B and B.  Hand washing in standing with supervision.  Gait without AD, carrying lap top computer in L hand at waist, x 150' without LOB.  Gait up/down 12 steps R rail, self selected step through technique, CG/close supervision without LOB or knee buckling.   Balance re-training, using floor dots, with LOB requiring max assist for balance recovery with advanced 3 sequence dots using L or R foot.  Pt retireved 5 items from floor in standing, with CGA, no LOB.    Gait to return to room, kicking small cone with L foot, with LOB 50% of time.  At end of session, pt seated in recliner iwht bil LEs elevated, needs at hand and seat pad alaarm set.  PT reiterated to pt that she needs to ask for staff to help her go to BR.     Therapy Documentation Precautions:  Precautions Precautions: Fall Restrictions Weight Bearing Restrictions: No Pain: Pain Assessment Pain Scale: 0-10 Pain Score: 0-No pain       Therapy/Group: Individual Therapy  Calden Dorsey 10/07/2020, 12:32 PM

## 2020-10-07 NOTE — Progress Notes (Signed)
Occupational Therapy Session Note  Patient Details  Name: Leslie [name] MRN: [id number] Date of Birth: October 17, 1926  Today's Date: 10/07/2020 OT Individual Time: 0413-6438 OT Individual Time Calculation (min): 54 min    Short Term Goals: Week 1:  OT Short Term Goal 1 (Week 1): STGs = LTGs  Skilled Therapeutic Interventions/Progress Updates:    Pt received seated EOB, agreeable to therapy. STS and amb to and from bathroom to use toilet, complete pericare, and LB clothing management with close S and no AD. Washed hands, gathered clothing, and got dressed at S level, only req increased time to recall need for socks and to zip up pants. Amb to and from gym no AD and light CGA around obstacles. To target functional cognition/memory, dynamic standing balance, and activity tolerance pt completed 2 rounds of BITS memory games (with words and letters) with over 92% accuracy after mod demonstrational cuing on how to play. C/o of mild back pain after standing for ~10 min that resolved with seated rest, but no fatigue. Additionally, completed kitchen scavenger hunt when given a list, req only min VCs to locate 2 items. Additionally discussed importance of cont to use to-do lists for grocery lists and for home safety habits (turning off stove, locking doors, etc.) Finally, identified fall hazards in pictures of household areas (front door, living room) with overall great accuracy. Amb transfer back to recliner.   Pt left in recliner with chair alarm engaged, call bell in reach, and all immediate needs met. '   Therapy Documentation Precautions:  Precautions Precautions: Fall Restrictions Weight Bearing Restrictions: No Pain: Pain Assessment Pain Scale: 0-10 Pain Score: 0-No pain ADL: See Care Tool for more details.  Therapy/Group: Individual Therapy  Volanda Napoleon MS, OTR/L  10/07/2020, 12:28 PM

## 2020-10-07 NOTE — Progress Notes (Signed)
Speech Language Pathology Daily Session Note  Patient Details  Name: Leslie [name] MRN: [id number] Date of Birth: Jul 01, 1927  Today's Date: 10/07/2020 SLP Individual Time: 3329-5188 SLP Individual Time Calculation (min): 44 min  Short Term Goals: Week 1: SLP Short Term Goal 1 (Week 1): STGs=LTGs due to ELOS  Skilled Therapeutic Interventions:Skilled ST services focused on cognitive skills. SLP facilitated complex problem solving and error awareness skills in checkbook balancing task pt initially required supervision A verbal cues due to unfamiliar system, however quickly faded to mod I. Pt demonstrated mod I verbal problem solving in when to take current medication 1-2 x a day. Pt was left in room with call bell within reach and bed alarm set. SLP recommends to continue skilled services.     Pain Pain Assessment Pain Scale: 0-10 Pain Score: 0-No pain  Therapy/Group: Individual Therapy  [place]  [facility] 10/07/2020, 12:41 PM

## 2020-10-08 NOTE — Progress Notes (Signed)
[place] PHYSICAL MEDICINE & REHABILITATION PROGRESS NOTE  Subjective/Complaints: Patient seen sitting up at edge of her bed this morning eating breakfast.  Good sitting balance noted.  She states she slept well overnight.  She states she wants to go home.  ROS: Denies CP, SOB, N/V/D  Objective: Vital Signs: Blood pressure 130/83, pulse 84, temperature 98 F (36.7 C), resp. rate 18, height 5\' 5"  (1.651 m), weight 49 kg, SpO2 97 %. No results found. No results for input(s): WBC, HGB, HCT, PLT in the last 72 hours. No results for input(s): NA, K, CL, CO2, GLUCOSE, BUN, CREATININE, CALCIUM in the last 72 hours.  Intake/Output Summary (Last 24 hours) at 10/08/2020 0754 Last data filed at 10/07/2020 0810 Gross per 24 hour  Intake 70 ml  Output --  Net 70 ml        Physical Exam: BP 130/83 (BP Location: Left Arm)   Pulse 84   Temp 98 F (36.7 C)   Resp 18   Ht 5\' 5"  (1.651 m)   Wt 49 kg   SpO2 97%   BMI 17.98 kg/m   Constitutional: No distress . Vital signs reviewed. HENT: Normocephalic.  Atraumatic. Eyes: EOMI. No discharge. Cardiovascular: No JVD.  RRR. Respiratory: Normal effort.  No stridor.  Bilateral clear to auscultation. GI: Non-distended.  BS +. Skin: Warm and dry.  Intact. Psych: Normal mood.  Normal behavior. Musc: No edema in extremities.  No tenderness in extremities. Neuro: Alert HOH Motor: LUE: 4+/5 proximal to distal, unchanged LLE: 4+/5 proximal to distal, stable  Assessment/Plan: 1. Functional deficits which require 3+ hours per day of interdisciplinary therapy in a comprehensive inpatient rehab setting.  Physiatrist is providing close team supervision and 24 hour management of active medical problems listed below.  Physiatrist and rehab team continue to assess barriers to discharge/monitor patient progress toward functional and medical goals   Care Tool:  Bathing    Body parts bathed by patient: Right arm,Left lower leg,Left  arm,Face,Chest,Abdomen,Front perineal area,Buttocks,Right upper leg,Left upper leg,Right lower leg         Bathing assist Assist Level: Supervision/Verbal cueing     Upper Body Dressing/Undressing Upper body dressing   What is the patient wearing?: Pull over shirt    Upper body assist Assist Level: Supervision/Verbal cueing    Lower Body Dressing/Undressing Lower body dressing      What is the patient wearing?: Underwear/pull up,Pants     Lower body assist Assist for lower body dressing: Supervision/Verbal cueing     Toileting Toileting    Toileting assist Assist for toileting: Supervision/Verbal cueing     Transfers Chair/bed transfer  Transfers assist     Chair/bed transfer assist level: Supervision/Verbal cueing     Locomotion Ambulation   Ambulation assist      Assist level: Contact Guard/Touching assist Assistive device: No Device Max distance: 150   Walk 10 feet activity   Assist     Assist level: Contact Guard/Touching assist Assistive device: No Device   Walk 50 feet activity   Assist    Assist level: Contact Guard/Touching assist Assistive device: No Device    Walk 150 feet activity   Assist    Assist level: Contact Guard/Touching assist Assistive device: No Device    Walk 10 feet on uneven surface  activity   Assist Walk 10 feet on uneven surfaces activity did not occur: Safety/medical concerns         Wheelchair     Assist Will patient use  wheelchair at discharge?: No             Wheelchair 50 feet with 2 turns activity    Assist            Wheelchair 150 feet activity     Assist           Medical Problem List and Plan: 1.Functional deficits and 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 Antithrombotics: -DVT/anticoagulation:Pharmaceutical:Eliquis -antiplatelet therapy: N/A 3.Chronic neck/back painPain  Management:  Controlled on 3/27             Monitor with increased exertion 4. Mood:LCSW to follow for evaluation and support. -antipsychotic agents: N/A 5. Neuropsych: This patientiscapable of making decisions on herown behalf. 6. Skin/Wound Care:Routine pressure relief measures. 7. Fluids/Electrolytes/Nutrition:Monitor I/Os. 8. HTN: Monitor BP tid--avoid hypoperfusion.  Home Toprolol 25 restarted  Controlled on 3/27            Monitor with increased mobilitty  9. Cardiomyopathy/combined CHF/Asymptomatic episode NSVT: Low dose BB added on 03/22 per Dr. Alveta Heimlich. --monitor for symptoms with therapy activities.  --conservative management and follow up on outpatient basis.   Daily weights ordered Filed Weights   10/03/20 1513 10/08/20 0500  Weight: 48.9 kg 49 kg   Stable on 3/27 10. Prediabetes: Hgb A1C-5.8  Controlled on 3/23  Monitor with increased mobility  11. Gram negative UTI: Treated with dose of fosfomycin on 03/21             Continue to monitor 12. Anxiety d/o: Used Xanax prn at nights for sleep 13. Hypoalbuminemia  Supplement initiated on 3/22 14. Acute blood loss anemia  Hb 11.3 on 3/23  Continue to monitor 15. Slow transit constipation  Bowel meds increased  Improving  LOS: 5 days A FACE TO FACE EVALUATION WAS PERFORMED  Leslie [name] Leslie [name] 10/08/2020, 7:54 AM

## 2020-10-08 NOTE — Discharge Instructions (Addendum)
Inpatient Rehab Discharge Instructions  LENORE MOYANO Discharge date and time: 10/10/20   Activities/Precautions/ Functional Status: Activity: no lifting, driving, or strenuous exercise till cleared by MD Diet: cardiac diet Wound Care: none needed   Functional status:  ___ No restrictions     ___ Walk up steps independently _X__ 24/7 supervision/assistance   ___ Walk up steps with assistance ___ Intermittent supervision/assistance  _X__ Bathe/dress independently _X__ Walk with walker    ___ Bathe/dress with assistance _X__ Walk Independently in home   ___ Shower independently ___ Walk with assistance    ___ Shower with assistance _X__ No alcohol     ___ Return to work/school ________  Special Instructions:   COMMUNITY REFERRALS UPON DISCHARGE:    Outpatient: PT & SP             Agency:[facility] Phone:[phone]              Appointment Date/Time:WILL CALL SON-DALE TO SET UP APPOINTMENTS  Medical Equipment/Items Ordered:NO NEEDS                                                 Agency/Supplier:NA  GAVE SON PRIVATE DUTY LIST OF AGENCIES TO PURSUE ONCE HOME  STROKE/TIA DISCHARGE INSTRUCTIONS SMOKING Cigarette smoking nearly doubles your risk of having a stroke & is the single most alterable risk factor  If you smoke or have smoked in the last 12 months, you are advised to quit smoking for your health.  Most of the excess cardiovascular risk related to smoking disappears within a year of stopping.  Ask you doctor about anti-smoking medications  [place] Quit Line: 1-800-QUIT NOW  Free Smoking Cessation Classes [phone]  CHOLESTEROL Know your levels; limit fat & cholesterol in your diet  Lipid Panel     Component Value Date/Time   CHOL 135 10/01/2020 1801   TRIG 51 10/01/2020 1801   HDL 47 10/01/2020 1801   CHOLHDL 2.9 10/01/2020 1801   VLDL 10 10/01/2020 1801   LDLCALC 78 10/01/2020 1801   LDLCALC 100 (H) 05/17/2020 1229      Many patients  benefit from treatment even if their cholesterol is at goal.  Goal: Total Cholesterol (CHOL) less than 160  Goal:  Triglycerides (TRIG) less than 150  Goal:  HDL greater than 40  Goal:  LDL (LDLCALC) less than 100   BLOOD PRESSURE American Stroke Association blood pressure target is less that 120/80 mm/Hg  Your discharge blood pressure is:  BP: 117/69  Monitor your blood pressure  Limit your salt and alcohol intake  Many individuals will require more than one medication for high blood pressure  DIABETES (A1c is a blood sugar average for last 3 months) Goal HGBA1c is under 7% (HBGA1c is blood sugar average for last 3 months)  Diabetes: No known diagnosis of diabetes    Lab Results  Component Value Date   HGBA1C 5.8 (H) 10/02/2020     Your HGBA1c can be lowered with medications, healthy diet, and exercise.  Check your blood sugar as directed by your physician  Call your physician if you experience unexplained or low blood sugars.  PHYSICAL ACTIVITY/REHABILITATION Goal is 30 minutes at least 4 days per week  Activity: No driving, Therapies: see above Return to work: N/A  Activity decreases your risk of heart attack and stroke and makes your  heart stronger.  It helps control your weight and blood pressure; helps you relax and can improve your mood.  Participate in a regular exercise program.  Talk with your doctor about the best form of exercise for you (dancing, walking, swimming, cycling).  DIET/WEIGHT Goal is to maintain a healthy weight  Your discharge diet is:  Diet Order            Diet Heart Room service appropriate? Yes; Fluid consistency: Thin  Diet effective now                thin  liquids Your height is:  Height: 5\' 5"  (165.1 cm) Your current weight is: Weight: 49 kg Your Body Mass Index (BMI) is:  BMI (Calculated): 17.98  Following the type of diet specifically designed for you will help prevent another stroke.  You are below goal weight   Your goal  Body Mass Index (BMI) is 19-24.  Healthy food habits can help reduce 3 risk factors for stroke:  High cholesterol, hypertension, and excess weight.  RESOURCES Stroke/Support Group:  Call [phone]   STROKE EDUCATION PROVIDED/REVIEWED AND GIVEN TO PATIENT Stroke warning signs and symptoms How to activate emergency medical system (call 911). Medications prescribed at discharge. Need for follow-up after discharge. Personal risk factors for stroke. Pneumonia vaccine given:  Flu vaccine given:  My questions have been answered, the writing is legible, and I understand these instructions.  I will adhere to these goals & educational materials that have been provided to me after my discharge from the hospital.     My questions have been answered and I understand these instructions. I will adhere to these goals and the provided educational materials after my discharge from the hospital.  Patient/Caregiver Signature _______________________________ Date __________  Clinician Signature _______________________________________ Date __________  Please bring this form and your medication list with you to all your follow-up doctor's appointments. Information on my medicine - ELIQUIS (apixaban)  This medication education was reviewed with me or my healthcare representative as part of my discharge preparation.  Why was Eliquis prescribed for you? Eliquis was prescribed for you to reduce the risk of forming blood clots that can cause a stroke if you have a medical condition called atrial fibrillation (a type of irregular heartbeat) OR to reduce the risk of a blood clots forming after orthopedic surgery.  What do You need to know about Eliquis ? Take your Eliquis TWICE DAILY - one tablet in the morning and one tablet in the evening with or without food.  It would be best to take the doses about the same time each day.  If you have difficulty swallowing the tablet whole please discuss with your  pharmacist how to take the medication safely.  Take Eliquis exactly as prescribed by your doctor and DO NOT stop taking Eliquis without talking to the doctor who prescribed the medication.  Stopping may increase your risk of developing a new clot or stroke.  Refill your prescription before you run out.  After discharge, you should have regular check-up appointments with your healthcare provider that is prescribing your Eliquis.  In the future your dose may need to be changed if your kidney function or weight changes by a significant amount or as you get older.  What do you do if you miss a dose? If you miss a dose, take it as soon as you remember on the same day and resume taking twice daily.  Do not take more than one dose of  ELIQUIS at the same time.  Important Safety Information A possible side effect of Eliquis is bleeding. You should call your healthcare provider right away if you experience any of the following: ? Bleeding from an injury or your nose that does not stop. ? Unusual colored urine (red or dark brown) or unusual colored stools (red or black). ? Unusual bruising for unknown reasons. ? A serious fall or if you hit your head (even if there is no bleeding).  Some medicines may interact with Eliquis and might increase your risk of bleeding or clotting while on Eliquis. To help avoid this, consult your healthcare provider or pharmacist prior to using any new prescription or non-prescription medications, including herbals, vitamins, non-steroidal anti-inflammatory drugs (NSAIDs) and supplements.  This website has more information on Eliquis (apixaban): [email or web address].[email or web address].

## 2020-10-09 LAB — BASIC METABOLIC PANEL
Anion gap: 4 — ABNORMAL LOW (ref 5–15)
BUN: 23 mg/dL (ref 8–23)
CO2: 27 mmol/L (ref 22–32)
Calcium: 9.3 mg/dL (ref 8.9–10.3)
Chloride: 107 mmol/L (ref 98–111)
Creatinine, Ser: 0.71 mg/dL (ref 0.44–1.00)
GFR, Estimated: >60 mL/min (ref >60)
Glucose, Bld: 97 mg/dL (ref 70–99)
Potassium: 3.8 mmol/L (ref 3.5–5.1)
Sodium: 138 mmol/L (ref 135–145)

## 2020-10-09 NOTE — Progress Notes (Signed)
Physical Therapy Discharge Summary  Patient Details  Name: Leslie [name] MRN: [id number] Date of Birth: [birth year]  Today's Date: 10/09/2020 PT Individual Time: 1430-1505 PT Individual Time Calculation (min): 35 min  Pt's son, [name], present for therapy session this PM and engaged in family education in regards to recommendation for overall higher level supervision recommendations at this time. Generally patient is moving at an overall modified independent level without assistive device but due to cognitive impairments, is at a higher fall risk. Pt performed bed mobility, gait and transfers throughout session at modified independent level without AD including car transfer, ramp negotiation, and curb step with rail for support. Engaged in stair negotiation training for community access (only 2 stairs at home) at overall supervision level using single rail for support. Recommending railings for any stairs or use of HHA if no rails available at this time for safety. Son and patient in agreement. Administered berg balance assessment (see below) for details as well as gait velocity. Pt's son checked off to provide assistance for transfers in the room. Denies any further questions and answered questions about follow up services (recommending OPPT).   Patient has met 9 of 9 long term goals due to improved activity tolerance, improved balance, improved postural control, increased strength, ability to compensate for deficits, functional use of  left lower extremity, improved awareness and improved coordination.  Patient to discharge at an ambulatory level household modified independent without AD but recommending supervision due to higher level cognitive deficits. .   Patient's care partner is independent to provide the necessary supervision assistance at discharge.  Reasons goals not met: n/a - all goals met at this time  Recommendation:  Patient will benefit from ongoing skilled PT services in outpatient  setting to continue to advance safe functional mobility, address ongoing impairments in higher level balance, muscular endurance and strength, cognition, functional mobility, and minimize fall risk.  Equipment: No equipment provided  Reasons for discharge: treatment goals met and discharge from hospital  Patient/family agrees with progress made and goals achieved: Yes  PT Discharge Precautions/Restrictions Precautions Precautions: Fall Pain  Denies pain. Cognition Overall Cognitive Status: Impaired/Different from baseline Arousal/Alertness: Awake/alert Orientation Level: Oriented X4 Sustained Attention: Appears intact Memory: Impaired Memory Impairment: Decreased recall of new information;Decreased short term memory Awareness: Impaired Awareness Impairment: Anticipatory impairment Problem Solving: Impaired Problem Solving Impairment: Functional complex Safety/Judgment: Appears intact Sensation Coordination Gross Motor Movements are Fluid and Coordinated: Yes Motor  Motor Motor: Within Functional Limits  Mobility Bed Mobility Bed Mobility: Rolling Left;Supine to Sit;Sit to Supine Rolling Left: Independent Supine to Sit: Independent Sit to Supine: Independent Transfers Transfers: Sit to Stand;Stand to Sit;Stand Pivot Transfers Sit to Stand: Independent Stand to Sit: Independent Stand Pivot Transfers: Independent Locomotion  Gait Ambulation: Yes Gait Assistance: Independent Gait Distance (Feet): 200 Feet Assistive device: None Gait Gait: Yes Gait Pattern: Step-through pattern;Narrow base of support;Trunk flexed;Decreased step length - right;Decreased step length - left Gait velocity: 2.66 ft/sec Stairs / Additional Locomotion Stairs: Yes Stairs Assistance: Supervision/Verbal cueing Stair Management Technique: One rail Right Number of Stairs: 12 Height of Stairs: 6 Ramp: Supervision/Verbal cueing Curb: Independent with assistive device Wheelchair  Mobility Wheelchair Mobility: No  Trunk/Postural Assessment  Cervical Assessment Cervical Assessment:  (rounded shoulders/forward head) Thoracic Assessment Thoracic Assessment:  (mild kyphosis) Lumbar Assessment Lumbar Assessment:  (anterior pelvic tilt)  Balance Standardized Balance Assessment Standardized Balance Assessment: Berg Balance Test Berg Balance Test Sit to Stand: Able to stand without using hands and stabilize independently  Standing Unsupported: Able to stand safely 2 minutes Sitting with Back Unsupported but Feet Supported on Floor or Stool: Able to sit safely and securely 2 minutes Stand to Sit: Sits safely with minimal use of hands Transfers: Able to transfer safely, minor use of hands Standing Unsupported with Eyes Closed: Able to stand 10 seconds safely Standing Ubsupported with Feet Together: Able to place feet together independently and stand 1 minute safely From Standing, Reach Forward with Outstretched Arm: Can reach forward >12 cm safely (5") From Standing Position, Pick up Object from Floor: Able to pick up shoe safely and easily From Standing Position, Turn to Look Behind Over each Shoulder: Looks behind one side only/other side shows less weight shift Turn 360 Degrees: Able to turn 360 degrees safely but slowly Standing Unsupported, Alternately Place Feet on Step/Stool: Able to complete 4 steps without aid or supervision Standing Unsupported, One Foot in Front: Able to take small step independently and hold 30 seconds Standing on One Leg: Able to lift leg independently and hold equal to or more than 3 seconds Total Score: 46 Extremity Assessment      RLE Assessment RLE Assessment: Within Functional Limits LLE Assessment General Strength Comments: Grossly 4+/5    Juanna Cao, PT, DPT, CBIS  10/09/2020, 3:37 PM

## 2020-10-09 NOTE — Progress Notes (Signed)
Speech Language Pathology Discharge Summary  Patient Details  Name: Leslie [name] MRN: [id number] Date of Birth: 06-Jan-1927  Today's Date: 10/09/2020 SLP Individual Time: 1300-1340 SLP Individual Time Calculation (min): 40 min   Skilled Therapeutic Interventions:  Skilled treatment session focused on completion of family education with the patient and her son, [name]. Both were educated regarding patient's current cognitive functioning and the importance of 24 hour supervision. Education regarding strategies to utilize at home to maximize recall, problem solving and overall safety was also provided. Both verbalized understanding of all information and handouts were given to reinforce information. Patient left upright in bed with alarm on and son present. Continue with current plan of care.   Patient has met 5 of 5 long term goals.  Patient to discharge at overall Supervision;Modified Independent level.   Reasons goals not met: N/A   Clinical Impression/Discharge Summary: Patient has made excellent gains and has met 5 of 5 LTGs this admission. Currently, patient requires overall Mod I-Supervision to complete functional and mildly complex tasks safely in regarding to problem solving, recall and awareness. Patient is also consuming regular textures with thin liquids with minimal overt s/s of aspiration with overall Mod I for use of swallowing compensatory strategies. Patient and family education is complete and patient will discharge home with assistance from family. Patient would benefit from f/u SLP services to maximize her cognitive and swallowing function in order to reduce caregiver burden.   Care Partner:  Caregiver Able to Provide Assistance: Yes     Recommendation:  Outpatient SLP;24 hour supervision/assistance  Rationale for SLP Follow Up: Reduce caregiver burden;Maximize cognitive function and independence   Equipment: N/A   Reasons for discharge: Discharged from hospital;Treatment  goals met   Patient/Family Agrees with Progress Made and Goals Achieved: Yes    Penney Domanski, [place] 10/09/2020, 6:32 AM

## 2020-10-09 NOTE — Progress Notes (Signed)
Patient ID: Leslie [name], female   DOB: 1927-05-11, 87 y.o.   MRN: [id number]  Met with pt and her son who was here for speech session. Discussed team recommends OPPT and OPSPT both [pt and son felt [facility] is closer to her and son could transport to appointments. Will ask OP to contact son to set up appointments. Pt has no equipment needs. Son asked for private duty list in case needed it once home. Aware of team's recommendation of 24/7 supervision level. He will provide this short term and go from there. Both pleased with her progress and ready for discharge tomorrow.

## 2020-10-09 NOTE — Progress Notes (Signed)
Occupational Therapy Discharge Summary  Patient Details  Name: Leslie [name] MRN: [id number] Date of Birth: February 17, 1927   Patient has met 12 of 12 long term goals due to improved activity tolerance, improved balance and improved awareness.  Patient to discharge at overall Modified Independent level with self care and VERY basic meal prep of warming microwave meals and light housework, BUT DUE TO PT'S COGNITIVE/MEMORY IMPAIRMENTS IT IS RECOMMENDED SHE HAVE 24/7 SUPERVISION.   Patient's care partner is independent to provide the necessary cognitive assistance at discharge.    Reasons goals not met: n/a  Recommendation:  Patient will benefit from ongoing skilled OT services in home health setting to continue to advance functional skills in the area of iADL.  Equipment: No equipment provided - pt has a shower chair  Reasons for discharge: treatment goals met  Patient/family agrees with progress made and goals achieved: Yes  OT Discharge Precautions/Restrictions  Precautions Precautions: Fall Restrictions Weight Bearing Restrictions: No Pain Pain Assessment Pain Scale: 0-10 Pain Score: 0-No pain ADL ADL Eating: Independent Grooming: Independent Where Assessed-Grooming: Standing at sink Upper Body Bathing: Independent Where Assessed-Upper Body Bathing: Shower Lower Body Bathing: Independent Where Assessed-Lower Body Bathing: Shower Upper Body Dressing: Independent Where Assessed-Upper Body Dressing: Chair Lower Body Dressing: Independent Where Assessed-Lower Body Dressing: Chair Toileting: Independent Where Assessed-Toileting: Glass blower/designer: Diplomatic Services operational officer Method: Human resources officer: Modified independent Clinical cytogeneticist Method: Optometrist: Civil engineer, contracting with back,Grab bars,Walk in shower Vision Patient Visual Report: No change from baseline Vision Assessment?: No apparent visual deficits Perception   Spatial Orientation: difficulty with clock draw test Praxis Praxis: Intact Cognition Overall Cognitive Status: Impaired/Different from baseline Memory: Impaired Memory Impairment: Decreased recall of new information;Decreased short term memory Sensation Sensation Light Touch: Appears Intact Hot/Cold: Appears Intact Proprioception: Appears Intact Stereognosis: Appears Intact Motor  Motor Motor: Within Functional Limits Mobility    supervision with ambulation without AD Trunk/Postural Assessment  Postural Control Postural Control: Within Functional Limits  Balance Dynamic Sitting Balance Dynamic Sitting - Level of Assistance: 7: Independent Dynamic Standing Balance Dynamic Standing - Level of Assistance: 6: Modified independent (Device/Increase time) (DURING BASIC SELF CARE SKILLS) Extremity/Trunk Assessment RUE Assessment RUE Assessment: Within Functional Limits LUE Assessment LUE Assessment: Within Functional Limits   SAGUIER,JULIA 10/09/2020, 10:51 AM

## 2020-10-09 NOTE — Progress Notes (Signed)
Occupational Therapy Session Note  Patient Details  Name: Leslie [name] MRN: [id number] Date of Birth: 1927/01/15  Today's Date: 10/09/2020 OT Individual Time: 7616-0737 OT Individual Time Calculation (min): 40 min    Short Term Goals: Week 1:  OT Short Term Goal 1 (Week 1): STGs = LTGs   Skilled Therapeutic Interventions/Progress Updates:    Pt greeted at time of session sitting up in recliner with son present who remained throughout session. Pt completed multiple ADL transfers to/from recliner, toilet, shower, etc with Independence with good hand/foot placement and with no AD. Toileting with Mod I, occasionally using grab bar or surface for support but no AD. Walked to/from gym<>room with Independence and performed several dynamic standing tasks 2x20 rebounder throws +overhead press with lightweight ball. Dynamic standing/functional mobility for picking up cones from various surfaces/heights no AD and no LOB. Discussion throughout session with pt and son regarding DC planning and going home tomorrow. No questions. Simulated remaining ADL tasks and pt Mod I. Pt back in recliner with alarm on call bell in reach.   Therapy Documentation Precautions:  Precautions Precautions: Fall Restrictions Weight Bearing Restrictions: No     Therapy/Group: Individual Therapy  Erasmo Score 10/09/2020, 7:55 AM

## 2020-10-09 NOTE — Progress Notes (Signed)
PHYSICAL MEDICINE & REHABILITATION PROGRESS NOTE  Subjective/Complaints: Patient seen sitting up in bed this morning.  She states she slept well overnight.  She is open for discharge tomorrow.  ROS: Denies CP, SOB, N/V/D  Objective: Vital Signs: Blood pressure 118/71, pulse 62, temperature 98.3 F (36.8 C), temperature source Oral, resp. rate 18, height 5\' 5"  (1.651 m), weight 49 kg, SpO2 95 %. No results found. No results for input(s): WBC, HGB, HCT, PLT in the last 72 hours. Recent Labs    10/09/20 0608  NA 138  K 3.8  CL 107  CO2 27  GLUCOSE 97  BUN 23  CREATININE 0.71  CALCIUM 9.3    Intake/Output Summary (Last 24 hours) at 10/09/2020 0914 Last data filed at 10/09/2020 0804 Gross per 24 hour  Intake 360 ml  Output --  Net 360 ml        Physical Exam: BP 118/71 (BP Location: Left Arm)   Pulse 62   Temp 98.3 F (36.8 C) (Oral)   Resp 18   Ht 5\' 5"  (1.651 m)   Wt 49 kg   SpO2 95%   BMI 17.98 kg/m   Constitutional: No distress . Vital signs reviewed. HENT: Normocephalic.  Atraumatic. Eyes: EOMI. No discharge. Cardiovascular: No JVD.  RRR. Respiratory: Normal effort.  No stridor.  Bilateral clear to auscultation. GI: Non-distended.  BS +. Skin: Warm and dry.  Intact. Psych: Normal mood.  Normal behavior. Musc: No edema in extremities.  No tenderness in extremities. Neuro: Alert HOH Motor: LUE: 4+-5/5 proximal to distal LLE: 4+/5/5 proximal to distal  Assessment/Plan: 1. Functional deficits which require 3+ hours per day of interdisciplinary therapy in a comprehensive inpatient rehab setting.  Physiatrist is providing close team supervision and 24 hour management of active medical problems listed below.  Physiatrist and rehab team continue to assess barriers to discharge/monitor patient progress toward functional and medical goals   Care Tool:  Bathing    Body parts bathed by patient: Right arm,Left lower leg,Left  arm,Face,Chest,Abdomen,Front perineal area,Buttocks,Right upper leg,Left upper leg,Right lower leg         Bathing assist Assist Level: Supervision/Verbal cueing     Upper Body Dressing/Undressing Upper body dressing   What is the patient wearing?: Pull over shirt    Upper body assist Assist Level: Supervision/Verbal cueing    Lower Body Dressing/Undressing Lower body dressing      What is the patient wearing?: Underwear/pull up,Pants     Lower body assist Assist for lower body dressing: Supervision/Verbal cueing     Toileting Toileting    Toileting assist Assist for toileting: Supervision/Verbal cueing     Transfers Chair/bed transfer  Transfers assist     Chair/bed transfer assist level: Supervision/Verbal cueing     Locomotion Ambulation   Ambulation assist      Assist level: Contact Guard/Touching assist Assistive device: No Device Max distance: 150   Walk 10 feet activity   Assist     Assist level: Contact Guard/Touching assist Assistive device: No Device   Walk 50 feet activity   Assist    Assist level: Contact Guard/Touching assist Assistive device: No Device    Walk 150 feet activity   Assist    Assist level: Contact Guard/Touching assist Assistive device: No Device    Walk 10 feet on uneven surface  activity   Assist Walk 10 feet on uneven surfaces activity did not occur: Safety/medical concerns         Wheelchair  Assist Will patient use wheelchair at discharge?: No             Wheelchair 50 feet with 2 turns activity    Assist            Wheelchair 150 feet activity     Assist           Medical Problem List and Plan: 1.Functional deficits and left 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 Antithrombotics: -DVT/anticoagulation:Pharmaceutical:Eliquis -antiplatelet therapy: N/A 3.Chronic neck/back  painPain Management:  Controlled on 3/28             Monitor with increased exertion 4. Mood:LCSW to follow for evaluation and support. -antipsychotic agents: N/A 5. Neuropsych: This patientiscapable of making decisions on herown behalf. 6. Skin/Wound Care:Routine pressure relief measures. 7. Fluids/Electrolytes/Nutrition:Monitor I/Os. 8. HTN: Monitor BP tid--avoid hypoperfusion.  Home Toprolol 25 restarted  Controlled on 3/28            Monitor with increased mobilitty  9. Cardiomyopathy/combined CHF/Asymptomatic episode NSVT: Low dose BB added on 03/22 per Dr. Alveta Heimlich. --monitor for symptoms with therapy activities.  --conservative management and follow up on outpatient basis.   Daily weights ordered Filed Weights   10/03/20 1513 10/08/20 0500 10/09/20 0423  Weight: 48.9 kg 49 kg 49 kg   Stable on 3/28 10. Prediabetes: Hgb A1C-5.8  Controlled on 3/28  Monitor with increased mobility  11. Gram negative UTI: Treated with dose of fosfomycin on 03/21             Continue to monitor 12. Anxiety d/o: Used Xanax prn at nights for sleep 13. Hypoalbuminemia  Supplement initiated on 3/22 14. Acute blood loss anemia  Hb 11.3 on 3/23  Continue to monitor 15. Slow transit constipation  Bowel meds increased  Improved  LOS: 6 days A FACE TO FACE EVALUATION WAS PERFORMED  Kail Fraley Karis Juba 10/09/2020, 9:14 AM

## 2020-10-09 NOTE — Progress Notes (Signed)
Inpatient Rehabilitation Care Coordinator Discharge Note  The overall goal for the admission was met for:   Discharge location: [facility] 24/7 CARE.  Length of Stay: Yes-7 DAYS  Discharge activity level: Yes-SUPERVISION LEVEL  Home/community participation: Yes  Services provided included: MD, RD, PT, OT, SLP, RN, CM, Pharmacy and SW  Financial Services: Medicare and Private Insurance: [place] offered to/list presented to:YES  Follow-up services arranged: Outpatient: [facility] & SP WILL CALL SON TO SET UP APPOINTMENTS  Comments (or additional information):SON-DALE HERE FOR EDUCATION AND IT WENT WELL CAN SEE DEFICITS AND AWARE OF HER 24/7 RECOMMENDATION AT DC.  Patient/Family verbalized understanding of follow-up arrangements: Yes  Individual responsible for coordination of the follow-up plan: DALE-SON [phone]  Confirmed correct DME delivered: Elease Hashimoto 10/09/2020    Gennell How, Gardiner Rhyme

## 2020-10-09 NOTE — Progress Notes (Signed)
Patient ID: Leslie [name], female   DOB: [birth year], 87 y.o.   MRN: [id number] Follow up with the patient regarding educational needs and preparation for discharge. Reviewed medications and management of secondary risk factors including HTN with HH/CMM diet DASH diet, HLD/statin, prediabetes and DAPTx 3 months with Eliquis. Patient reports she feels comfortable with management of risk factors and is ready for discharge tomorrow. Pamelia Hoit

## 2020-10-10 MED ORDER — ATORVASTATIN CALCIUM 20 MG PO TABS: 20.0000 mg | ORAL_TABLET | Freq: Every day | ORAL | 0 refills | Status: DC

## 2020-10-10 MED ORDER — APIXABAN 2.5 MG PO TABS: 2.5000 mg | ORAL_TABLET | Freq: Two times a day (BID) | ORAL | 0 refills | Status: DC

## 2020-10-10 MED ORDER — POLYETHYLENE GLYCOL 3350 17 G PO PACK: 17.0000 g | PACK | Freq: Two times a day (BID) | ORAL | 0 refills | Status: DC

## 2020-10-10 MED ORDER — METOPROLOL SUCCINATE ER 25 MG PO TB24: 25.0000 mg | ORAL_TABLET | Freq: Every day | ORAL | 0 refills | Status: DC

## 2020-10-10 NOTE — Progress Notes (Signed)
[facility] PHYSICAL MEDICINE & REHABILITATION PROGRESS NOTE  Subjective/Complaints: Patient seen sitting up in bed this morning.  She states she slept well overnight.  She is eager for discharge.  She is appreciative of her care.  ROS: Denies CP, SOB, N/V/D  Objective: Vital Signs: Blood pressure 117/69, pulse 75, temperature 98.1 F (36.7 C), resp. rate 15, height 5\' 5"  (1.651 m), weight 49 kg, SpO2 96 %. No results found. No results for input(s): WBC, HGB, HCT, PLT in the last 72 hours. Recent Labs    10/09/20 0608  NA 138  K 3.8  CL 107  CO2 27  GLUCOSE 97  BUN 23  CREATININE 0.71  CALCIUM 9.3    Intake/Output Summary (Last 24 hours) at 10/10/2020 1000 Last data filed at 10/10/2020 0720 Gross per 24 hour  Intake 460 ml  Output --  Net 460 ml        Physical Exam: BP 117/69 (BP Location: Left Arm)   Pulse 75   Temp 98.1 F (36.7 C)   Resp 15   Ht 5\' 5"  (1.651 m)   Wt 49 kg   SpO2 96%   BMI 17.98 kg/m   Constitutional: No distress . Vital signs reviewed. HENT: Normocephalic.  Atraumatic. Eyes: EOMI. No discharge. Cardiovascular: No JVD.  RRR. Respiratory: Normal effort.  No stridor.  Bilateral clear to auscultation. GI: Non-distended.  BS +. Skin: Warm and dry.  Intact. Psych: Normal mood.  Normal behavior. Musc: No edema in extremities.  No tenderness in extremities. Neuro: Alert HOH Motor: LUE: 4+-5/5 proximal to distal LLE: 4+/5/5 proximal to distal  Assessment/Plan: 1. Functional deficits which require 3+ hours per day of interdisciplinary therapy in a comprehensive inpatient rehab setting.  Physiatrist is providing close team supervision and 24 hour management of active medical problems listed below.  Physiatrist and rehab team continue to assess barriers to discharge/monitor patient progress toward functional and medical goals   Care Tool:  Bathing    Body parts bathed by patient: Right arm,Left lower leg,Left  arm,Face,Chest,Abdomen,Front perineal area,Buttocks,Right upper leg,Left upper leg,Right lower leg         Bathing assist Assist Level: Independent with assistive device (simulated)     Upper Body Dressing/Undressing Upper body dressing   What is the patient wearing?: Pull over shirt    Upper body assist Assist Level: Independent    Lower Body Dressing/Undressing Lower body dressing      What is the patient wearing?: Underwear/pull up,Pants     Lower body assist Assist for lower body dressing: Independent with assitive device (per staff report)     Toileting Toileting    Toileting assist Assist for toileting: Independent     Transfers Chair/bed transfer  Transfers assist     Chair/bed transfer assist level: Independent     Locomotion Ambulation   Ambulation assist      Assist level: Contact Guard/Touching assist Assistive device: No Device Max distance: 150   Walk 10 feet activity   Assist     Assist level: Independent Assistive device: No Device   Walk 50 feet activity   Assist    Assist level: Independent Assistive device: No Device    Walk 150 feet activity   Assist    Assist level: Independent Assistive device: No Device    Walk 10 feet on uneven surface  activity   Assist Walk 10 feet on uneven surfaces activity did not occur: Safety/medical concerns   Assist level: Supervision/Verbal cueing  Wheelchair     Assist Will patient use wheelchair at discharge?: No             Wheelchair 50 feet with 2 turns activity    Assist            Wheelchair 150 feet activity     Assist           Medical Problem List and Plan: 1.Functional deficits and left hemiparesissecondary to bi-cerebral cardio-embolic infarcts  DC today  Will see patient for hospital follow-up in 1 month post-discharge 2. Antithrombotics: -DVT/anticoagulation:Pharmaceutical:Eliquis -antiplatelet therapy:  N/A 3.Chronic neck/back painPain Management:  Controlled on 3/29             Monitor with increased exertion 4. Mood:LCSW to follow for evaluation and support. -antipsychotic agents: N/A 5. Neuropsych: This patientiscapable of making decisions on herown behalf. 6. Skin/Wound Care:Routine pressure relief measures. 7. Fluids/Electrolytes/Nutrition:Monitor I/Os. 8. HTN: Monitor BP tid--avoid hypoperfusion.  Home Toprolol 25 restarted  Controlled on 3/29            Monitor with increased mobilitty  9. Cardiomyopathy/combined CHF/Asymptomatic episode NSVT: Low dose BB added on 03/22 per Dr. Alveta Heimlich. --monitor for symptoms with therapy activities.  --conservative management and follow up on outpatient basis.   Daily weights ordered Filed Weights   10/03/20 1513 10/08/20 0500 10/09/20 0423  Weight: 48.9 kg 49 kg 49 kg   Stable on 3/29 10. Prediabetes: Hgb A1C-5.8  Controlled on 3/28  Monitor with increased mobility  11. Gram negative UTI: Treated with dose of fosfomycin on 03/21             Continue to monitor 12. Anxiety d/o: Used Xanax prn at nights for sleep 13. Hypoalbuminemia  Supplement initiated on 3/22 14. Acute blood loss anemia  Hb 11.3 on 3/23  Continue to monitor 15. Slow transit constipation  Bowel meds increased  Improved  > 30 minutes spent in total in discharge planning between myself and PA regarding aforementioned, as well discussion regarding DME equipment, follow-up appointments, follow-up therapies, discharge medications, discharge recommendations, answering questions.  Please see discharge summary as well.  LOS: 7 days A FACE TO FACE EVALUATION WAS PERFORMED  Brynnlee Cumpian Karis Juba 10/10/2020, 10:00 AM

## 2020-10-10 NOTE — Discharge Summary (Addendum)
Physician Discharge Summary  Patient ID: Leslie [name] MRN: [id number] DOB/AGE: [birth year] 87 y.o.  Admit date: 10/03/2020 Discharge date: 10/10/2020  Discharge Diagnoses:  Principal Problem:   Embolic stroke [facility]) Active Problems:   Hypoalbuminemia due to protein-calorie malnutrition (HCC)   Chronic combined systolic and diastolic CHF (congestive heart failure) (HCC)   Benign essential HTN   Chronic low back pain without sciatica   Acute blood loss anemia   Slow transit constipation   Discharged Condition: stable   Significant Diagnostic Studies: N/A   Labs:  Basic Metabolic Panel: Recent Labs  Lab 10/04/20 0535 10/09/20 0608  NA 138 138  K 4.8 3.8  CL 107 107  CO2 28 27  GLUCOSE 97 97  BUN 12 23  CREATININE 0.78 0.71  CALCIUM 9.2 9.3    CBC: CBC Latest Ref Rng & Units 10/04/2020 10/03/2020 10/01/2020  WBC 4.0 - 10.5 K/uL 4.3 5.3 -  Hemoglobin 12.0 - 15.0 g/dL 11.3(L) 11.3(L) 12.9  Hematocrit 36.0 - 46.0 % 35.3(L) 34.0(L) 38.0  Platelets 150 - 400 K/uL 421(H) 409(H) -    CBG: No results for input(s): GLUCAP in the last 168 hours.  Brief HPI:   Leslie [name] is a 87 y.o. female with history of glaucoma, anxiety, chronic back and neck pain who was admitted on 10/01/20 with slurred speech and facial droop. Work up revealed several  small punctate infarcts in right frontoparietal vertex and left cerebellum as well as severely depressed LVEF with EF 25%. Dr.Xu felt that stroke was embolic in setting of low EF and DOAC recommended till EF improved to >35%. Dr.Skaines was consulted for input and low dose BB added due to episode of asymptomatic NSVT. Therapy evaluation completed revealing balance deficits with decreased awareness of deficits. CIR recommended due to functional decline.   Hospital Course: Leslie [name] was admitted to rehab 10/03/2020 for inpatient therapies to consist of PT, ST and OT at least three hours five days a week. Past admission physiatrist, therapy  team and rehab RN have worked together to provide customized collaborative inpatient rehab. Blood pressures were monitored on TID basis and Toprol was resumed for better control. She continues on Eliquis for secondary stroke prevention.  Follow up CBC showed mild drop in H/H without signs of bleeding.   Check of BMET showed lytes and renal status to be WNL. No signs of overload noted. Back pain was managed with prn use of tylenol. She is continent of bowel and bladder. Constipation has resolved with increase in bowel meds. She has made good gains and is modified independent but supervision is recommended for safety. She will continue to receive follow up outpatient PT and ST at [facility] outpatient rehab after discharge.    Rehab course: During patient's stay in rehab team conference was held to monitor patient's progress, set goals and discuss barriers to discharge. At admission, patient required supervision with ADL tasks and min assist with mobility. She exhibited deficits in short term recall and problem solving. She  has had improvement in activity tolerance, balance, postural control as well as ability to compensate for deficits. She is able to complete ADLs and very basic IADLs at modified independent level.  She is ambulating household distances without AD. She is able to complete basic function and mildly complex functional tasks at modified independent to supervision level. Supervision is recommended due to cognitive deficits. Family education was completed with son.   Disposition: Home  Diet: Cardiac.   Special Instructions: 1.  Needs supervision for safety.  2. Repeat CBC in 1-2 weeks to monitor H/H. 3. Thyroid ultrasound recommended for follow up on right thyroid nodules.   Discharge Instructions     Ambulatory referral to Physical Medicine Rehab   Complete by: As directed    3-4 weeks follow up appointment      Allergies as of 10/10/2020       Reactions   Adhesive [tape]     Advil [ibuprofen]    Atenolol         Medication List     STOP taking these medications    hydrocortisone 2.5 % rectal cream Commonly known as: ANUSOL-HC   hydrocortisone 25 MG suppository Commonly known as: ANUSOL-HC   SENNA PO   Vitamin D3 125 MCG (5000 UT) Caps       TAKE these medications    apixaban 2.5 MG Tabs tablet Commonly known as: Eliquis Take 1 tablet (2.5 mg total) by mouth 2 (two) times daily.   atorvastatin 20 MG tablet Commonly known as: LIPITOR Take 1 tablet (20 mg total) by mouth daily.   latanoprost 0.005 % ophthalmic solution Commonly known as: XALATAN Place 1 drop into both eyes daily.   metoprolol succinate 25 MG 24 hr tablet Commonly known as: TOPROL-XL Take 1 tablet (25 mg total) by mouth daily.   polyethylene glycol 17 g packet Commonly known as: MIRALAX / GLYCOLAX Take 17 g by mouth 2 (two) times daily. Notes to patient: Purchase over the counter        Follow-up Information     Marcello Fennel, MD Follow up.   Specialty: Physical Medicine and Rehabilitation Why: Office will call you with follow up appointment Contact information: [address] [place] [address] [phone]         Lucky Cowboy, MD. Call.   Specialty: Internal Medicine Why: for post hospital follow up Contact information: [address] [place] [address] [phone]         Jake Bathe, MD. Call.   Specialty: Cardiology Why: for follow up on heart.  Contact information: [address] [place] [address] [phone]         [facility]. Call on 10/11/2020.   Why: for stroke follow up Contact information: [address]     [address] [place] [address] [phone]                Signed: Jacquelynn Cree 10/12/2020, 10:41 PM Patient was seen, face-face, and physical exam performed by me on day of discharge, greater than 30  minutes of total time spent.. Please see progress note from day of discharge as well.  Maryla Morrow, MD, ABPMR

## 2020-10-11 ENCOUNTER — Telehealth: Payer: Self-pay | Admitting: *Deleted

## 2020-10-11 DIAGNOSIS — E042 Nontoxic multinodular goiter: Secondary | ICD-10-CM | POA: Insufficient documentation

## 2020-10-11 DIAGNOSIS — I700 Atherosclerosis of aorta: Secondary | ICD-10-CM | POA: Insufficient documentation

## 2020-10-11 DIAGNOSIS — J439 Emphysema, unspecified: Secondary | ICD-10-CM | POA: Insufficient documentation

## 2020-10-11 NOTE — Progress Notes (Signed)
Hospital follow up  Assessment and Plan: Hospital visit follow up for:   Leslie [name] was seen today for hospitalization follow-up.  Diagnoses and all orders for this visit:  Cerebrovascular accident (CVA) due to embolism of precerebral artery (HCC) Without residual sx; back to baseline No a. Fib/DVT per hospital workup Continue statin for LDL goal <70 Elequis 2.5 mg BID per neuro/cardiology recommendations Reviewed risks/benefit of med at length; call office with any atypical bleeding Recommended ED evaluation if any falls with head injury Sons monitoring closely, given script for life alert monitor Closely monitor   Chronic combined systolic and diastolic CHF (congestive heart failure) (HCC) Appears euvolemic Newly on BB;  Cardiology appointment as scheduled Call office or present to ED for any weight gain with new fatigue, dyspnea or chest pain  Stage 3a chronic kidney disease (HCC) -     COMPLETE METABOLIC PANEL WITH GFR  Hypoalbuminemia due to protein-calorie malnutrition (HCC) -     COMPLETE METABOLIC PANEL WITH GFR  Multiple thyroid nodules -     US THYROID; Future  Benign essential HTN Continue medication Monitor blood pressure at home; call if consistently over 130/80 Continue DASH diet.   Reminder to go to the ER if any CP, SOB, nausea, dizziness, severe HA, changes vision/speech, left arm numbness and tingling and jaw pain.  Aortic atherosclerosis (HCC) Control blood pressure, cholesterol, glucose, increase exercise.   Pulmonary emphysema, unspecified emphysema type (HCC) Per imaging; monitor  Anemia, unspecified type Following hemorroid flare that has since resolved;  Mild; check levels, encourage high iron diet if mild def only, avoid constipating agents; continue miralax for soft stools -     CBC with Differential/Platelet -     Iron, TIBC and Ferritin Panel  All medications were reviewed with patient and family and fully reconciled. All questions answered  fully, and patient and family members were encouraged to call the office with any further questions or concerns. Discussed goal to avoid readmission related to this diagnosis.   There are no discontinued medications.  Over 40 minutes of exam, counseling, chart review, and complex, high/moderate level critical decision making was performed this visit.   Future Appointments  Date Time Provider Department Center  10/18/2020  9:30 AM Jearld Lesch, PT [facility]  10/18/2020 10:15 AM Dorena Bodo, CCC-SLP [facility]  11/08/2020  9:15 AM Ihor Austin, NP [facility] None  11/16/2020  9:40 AM Marcello Fennel, MD [facility]  11/29/2020  8:45 AM Laurann Montana, PA-C [facility]  12/05/2020 10:30 AM Elder Negus, NP [facility] None  04/11/2021  3:00 PM Lucky Cowboy, MD [facility] None  05/17/2021 11:00 AM Judd Gaudier, NP [facility] None     HPI 87 y.o.female presents for follow up for transition from recent hospitalization or SNIF stay. Admit date to the hospital was 10/01/2020-10/03/2020 then transitioned to SNF 10/03/20, patient was discharged from the SNF on 10/10/20 and our clinical staff contacted the office the day after discharge to set up a follow up appointment. The discharge summary, medications, and diagnostic test results were reviewed before meeting with the patient. The patient was admitted for:   Embolic CVA  Admitted From: home Disposition:  CIR  Recommendations at discharge Start Eliquis 2.5 mg twice daily, stop aspirin and Plavix  Home Health: None Equipment/Devices: None  Discharge Condition: Stable CODE STATUS: DNR Diet recommendation: Heart healthy  HPI: Per admitting MD, Leslie [name] a 87 y.o.femalewith medical history significant ofhypertension, hyperlipidemia, prediabetes, and vitamin D deficiency who  presented from church after being noticed to have slurred speech and left-sided facial droop. At baseline patient still  lives alone and is able to complete most of her ADLs. She routinely follows with care provider, but does not require medications for blood pressure or cholesterol. After patient started experiencing symptoms she was still able to ambulate without need of assistance and not any other symptoms or complaints at this time.  Hospital Course / Discharge diagnoses: Principal Problem  Acute CVA-neurology consulted, Dr. Dianna Rossetti work-up for stroke, A1c 5.8, LDL 78. CT angiogram without large or medium vessel occlusion or correctable proximal stenosis, DVT ruled out.  She was initially placed on dual antiplatelet therapy with aspirin and Plavix, however upon discovery of her depressed EF, neurology recommends Eliquis.  Cardiology consulted neurology on the day of discharge, given age patient will be placed on 2.5 mg Eliquis twice daily and aspirin and Plavix discontinued.   Active Problems Systolic CHF, unknown chronicity-a 2D echo done as part of the stroke work-up showed severely depressed LVEF with akinesis of the septum, inferior and apical walls, hypokinesis elsewhere. LVEF is 25%. RV is normal.  Cardiology was consulted and followed patient while hospitalized.  She is normotensive and could not be started on Entresto or aggressive regimen in the setting of acute stroke.  She did have NSVT on telemetry and was started on low-dose metoprolol on discharge with plan for outpatient follow up.  She is not a candidate for aggressive invasive work-up.  Thyroid nodules-we will need to be followed up as an outpatient. She has a history of left thyroidectomy Prediabetes-dietary measures CKD 3A-creatinine at baseline  Sepsis ruled out  Hospital follow up 10/12/2020  BP 114/76   Pulse (!) 55   Temp (!) 97.3 F (36.3 C)   Wt 104 lb (47.2 kg)   SpO2 99%   BMI 17.31 kg/m   87 y.o. female presents for hospital follow up on recent admission per above for embolic CVA, initially on DAPT  transitioned to elequis 2.5 mg BID due to depressed EF. 2D echo showed severely depressed LVEF with akinesis of the septum, inferior and apical walls, hypokinesis elsewhere. LVEF is 25% ,normal RV. She was initiated on toprol 25 mg daily with plan for outpatient cardiology follow up. She was discharged to inpatient therapy ~ 10/10/2020.   She is accompanied by her two sons today, they are alternating staying with her at night in her apartment, she is not driving at this time, but doing very well and intends to slowly transition back to independent living with close check ins from her sons. Denies residual sx from recent CVA following therapy.   Has appointment 11/29/2020 with Dr. Judd Gaudier PA, but on wait list to move up her appointment sooner if any availability.   CTA neck incidentally noted multiple R thyroid nodules ~1.4 cm recommended for outpatient [place] follow up. Hx of left thyroidectomy remotely.  Lab Results  Component Value Date   TSH 2.076 10/02/2020   Today their BP is BP: 114/76 . She denies chest pain, shortness of breath, dizziness.   She is on cholesterol medication and denies myalgias. Her cholesterol is not at goal. The cholesterol last visit was:   Lab Results  Component Value Date   CHOL 135 10/01/2020   HDL 47 10/01/2020   LDLCALC 78 10/01/2020   TRIG 51 10/01/2020   CHOLHDL 2.9 10/01/2020    She has mild anemia recommended for follow up; patient and family share with me she had  severe hemorrhoid flare prior to admission; improved with rectal suppositories, denies current pain or bleeding since. Taking miralax.   CBC Latest Ref Rng & Units 10/04/2020 10/03/2020 10/01/2020  WBC 4.0 - 10.5 K/uL 4.3 5.3 -  Hemoglobin 12.0 - 15.0 g/dL 11.3(L) 11.3(L) 12.9  Hematocrit 36.0 - 46.0 % 35.3(L) 34.0(L) 38.0  Platelets 150 - 400 K/uL 421(H) 409(H) -   No results found for: IRON, TIBC, FERRITIN    Home health is not involved.   Images while in the hospital:  CT  head/neck IMPRESSION: 1. Aortic atherosclerosis and ectasia. 2. Mild atherosclerotic change at both carotid bifurcations but without stenosis. 3. No intracranial large or medium vessel occlusion or correctable proximal stenosis. 4. Apparent occlusion of the innominate vein, possibly due to extrinsic compression between the arterial structures and right clavicular head. This is responsible for the pattern of venous collateral drainage described in the body of the report. 5. Previous left thyroidectomy. Multiple nodules within the right lobe of the thyroid, the largest measuring 1.4 cm in diameter. Recommend thyroid US (ref: J Am Coll Radiol. 2015 Feb;12(2): 143-50).  Aortic Atherosclerosis (ICD10-I70.0) and Emphysema (ICD10-J43.9).    Current Outpatient Medications (Cardiovascular):  .  atorvastatin (LIPITOR) 20 MG tablet, Take 1 tablet (20 mg total) by mouth daily. .  metoprolol succinate (TOPROL-XL) 25 MG 24 hr tablet, Take 1 tablet (25 mg total) by mouth daily.    Current Outpatient Medications (Hematological):  .  apixaban (ELIQUIS) 2.5 MG TABS tablet, Take 1 tablet (2.5 mg total) by mouth 2 (two) times daily.  Current Outpatient Medications (Other):  .  latanoprost (XALATAN) 0.005 % ophthalmic solution, Place 1 drop into both eyes daily. .  polyethylene glycol (MIRALAX / GLYCOLAX) 17 g packet, Take 17 g by mouth 2 (two) times daily.  Past Medical History:  Diagnosis Date  . Anxiety disorder   . Glaucoma   . Hearing loss   . HTN (hypertension)   . Hyperglycemia   . Hyperlipidemia   . Vitamin D deficiency      Allergies  Allergen Reactions  . Adhesive [Tape]   . Advil [Ibuprofen]   . Atenolol     ROS: all negative except above.   Physical Exam: Filed Weights   10/12/20 1003  Weight: 104 lb (47.2 kg)   BP 114/76   Pulse (!) 55   Temp (!) 97.3 F (36.3 C)   Wt 104 lb (47.2 kg)   SpO2 99%   BMI 17.31 kg/m  General Appearance: Well nourished, well  dressed, thin elder female in no apparent distress. Eyes: PERRLA, EOMs, conjunctiva no swelling or erythema Sinuses: No Frontal/maxillary tenderness ENT/Mouth: Ext aud canals clear, TMs without erythema, bulging. No erythema, swelling, or exudate on post pharynx.  Tonsils not swollen or erythematous. Hearing normal.  Neck: Supple, R thyroid with subtle texture, nodules not distinctly palpable; does have subcutaneous nodule ~ 1 cm (reports stable for many years) Respiratory: Respiratory effort normal, BS equal bilaterally without rales, rhonchi, wheezing or stridor.  Cardio: RRR with no MRGs. Brisk peripheral pulses without edema.  Abdomen: Soft, + BS.  Non tender, no guarding, rebound, hernias, masses. Lymphatics: Non tender without lymphadenopathy.  Musculoskeletal: Full ROM, 5/5 strength, slow steady gait.  Skin: Warm, dry without rashes, lesions, ecchymosis.  Neuro: Cranial nerves intact. Normal muscle tone, no cerebellar symptoms. Sensation intact. Neg pronator drift.  Psych: Awake and oriented X 3, normal affect, Insight and Judgment appropriate.     Dan Maker, NP 10:47  AM [facility]

## 2020-10-11 NOTE — Telephone Encounter (Signed)
Called patient on 10/11/2020 , 10:52 AM in an attempt to reach the patient for a hospital follow up. Spke with patient and son, Leslie [name], who states the patient is doing well.  Admit date: 10/03/20 Discharge: 10/10/20   She does not have any questions or concerns about medications from the hospital admission. The patient's medications were reviewed over the phone, they were counseled to bring in all current medications to the hospital follow up visit. Son expreesed understanding of all new medications.  I advised the patient to call if any questions or concerns arise about the hospital admission or medications    Home health was not started in the hospital. Patient has appointment at [facility] outpatient rehab for PT evaluation and treat. All questions were answered and a follow up appointment was made. Appointment is 10/12/2020 with Judd Gaudier, NP, for a follow up visit.  Prior to Admission medications   Medication Sig Start Date End Date Taking? Authorizing Provider  apixaban (ELIQUIS) 2.5 MG TABS tablet Take 1 tablet (2.5 mg total) by mouth 2 (two) times daily. 10/10/20   Love, Evlyn Kanner, PA-C  atorvastatin (LIPITOR) 20 MG tablet Take 1 tablet (20 mg total) by mouth daily. 10/10/20   Love, Evlyn Kanner, PA-C  latanoprost (XALATAN) 0.005 % ophthalmic solution Place 1 drop into both eyes daily. 08/05/16   [provider]  metoprolol succinate (TOPROL-XL) 25 MG 24 hr tablet Take 1 tablet (25 mg total) by mouth daily. 10/10/20   Love, Evlyn Kanner, PA-C  polyethylene glycol (MIRALAX / GLYCOLAX) 17 g packet Take 17 g by mouth 2 (two) times daily. 10/10/20   Jacquelynn Cree, PA-C

## 2020-10-12 ENCOUNTER — Encounter: Payer: Self-pay | Admitting: Adult Health

## 2020-10-12 ENCOUNTER — Other Ambulatory Visit: Payer: Self-pay

## 2020-10-12 ENCOUNTER — Ambulatory Visit (INDEPENDENT_AMBULATORY_CARE_PROVIDER_SITE_OTHER): Payer: Medicare Other | Admitting: Adult Health

## 2020-10-12 VITALS — BP 114/76 | HR 55 | Temp 97.3°F | Wt 104.0 lb

## 2020-10-12 DIAGNOSIS — D62 Acute posthemorrhagic anemia: Secondary | ICD-10-CM | POA: Diagnosis not present

## 2020-10-12 DIAGNOSIS — I6310 Cerebral infarction due to embolism of unspecified precerebral artery: Secondary | ICD-10-CM | POA: Diagnosis not present

## 2020-10-12 DIAGNOSIS — E042 Nontoxic multinodular goiter: Secondary | ICD-10-CM | POA: Diagnosis not present

## 2020-10-12 DIAGNOSIS — J439 Emphysema, unspecified: Secondary | ICD-10-CM

## 2020-10-12 DIAGNOSIS — I700 Atherosclerosis of aorta: Secondary | ICD-10-CM

## 2020-10-12 DIAGNOSIS — D649 Anemia, unspecified: Secondary | ICD-10-CM | POA: Diagnosis not present

## 2020-10-12 DIAGNOSIS — R0989 Other specified symptoms and signs involving the circulatory and respiratory systems: Secondary | ICD-10-CM

## 2020-10-12 DIAGNOSIS — N1831 Chronic kidney disease, stage 3a: Secondary | ICD-10-CM | POA: Diagnosis not present

## 2020-10-12 DIAGNOSIS — E46 Unspecified protein-calorie malnutrition: Secondary | ICD-10-CM

## 2020-10-12 DIAGNOSIS — I10 Essential (primary) hypertension: Secondary | ICD-10-CM

## 2020-10-12 DIAGNOSIS — E8809 Other disorders of plasma-protein metabolism, not elsewhere classified: Secondary | ICD-10-CM | POA: Diagnosis not present

## 2020-10-12 DIAGNOSIS — I5042 Chronic combined systolic (congestive) and diastolic (congestive) heart failure: Secondary | ICD-10-CM

## 2020-10-12 NOTE — Patient Instructions (Addendum)
Please avoid taking ibuprofen/aleve or aspirin with eliquis  Please contact office or go to ED if any falls with head injury  Please contact our office if any other unusual bleeding (in urine, etc)     YOU CAN CALL TO MAKE AN ULTRASOUND   I have put in an order for an ultrasound for you to have for your thyroid. You can set this up at your convenience by calling this number [phone] You will likely have the ultrasound at [address]  If you have any issues call our office and we will set this up for you.     Apixaban Tablets What is this medicine? APIXABAN (a PIX a ban) is an anticoagulant (blood thinner). It is used to lower the chance of stroke in people with a medical condition called atrial fibrillation. It is also used to treat or prevent blood clots in the lungs or in the veins. This medicine may be used for other purposes; ask your health care provider or pharmacist if you have questions. COMMON BRAND NAME(S): Eliquis What should I tell my health care provider before I take this medicine? They need to know if you have any of these conditions:  antiphospholipid antibody syndrome  bleeding disorders  bleeding in the brain  blood in your stools (black or tarry stools) or if you have blood in your vomit  history of blood clots  history of stomach bleeding  kidney disease  liver disease  mechanical heart valve  an unusual or allergic reaction to apixaban, other medicines, foods, dyes, or preservatives  pregnant or trying to get pregnant  breast-feeding How should I use this medicine? Take this medicine by mouth with a glass of water. Follow the directions on the prescription label. You can take it with or without food. If it upsets your stomach, take it with food. Take your medicine at regular intervals. Do not take it more often than directed. Do not stop taking except on your doctor's advice. Stopping this medicine may increase your risk of a  blood clot. Be sure to refill your prescription before you run out of medicine. Talk to your pediatrician regarding the use of this medicine in children. Special care may be needed. Overdosage: If you think you have taken too much of this medicine contact a poison control center or emergency room at once. NOTE: This medicine is only for you. Do not share this medicine with others. What if I miss a dose? If you miss a dose, take it as soon as you can. If it is almost time for your next dose, take only that dose. Do not take double or extra doses. What may interact with this medicine? This medicine may interact with the following:  aspirin and aspirin-like medicines  certain medicines for fungal infections like ketoconazole and itraconazole  certain medicines for seizures like carbamazepine and phenytoin  certain medicines that treat or prevent blood clots like warfarin, enoxaparin, and dalteparin  clarithromycin  NSAIDs, medicines for pain and inflammation, like ibuprofen or naproxen  rifampin  ritonavir  St. John's wort This list may not describe all possible interactions. Give your health care provider a list of all the medicines, herbs, non-prescription drugs, or dietary supplements you use. Also tell them if you smoke, drink alcohol, or use illegal drugs. Some items may interact with your medicine. What should I watch for while using this medicine? Visit your healthcare professional for regular checks on your progress. You may  need blood work done while you are taking this medicine. Your condition will be monitored carefully while you are receiving this medicine. It is important not to miss any appointments. Avoid sports and activities that might cause injury while you are using this medicine. Severe falls or injuries can cause unseen bleeding. Be careful when using sharp tools or knives. Consider using an Neurosurgeon. Take special care brushing or flossing your teeth. Report any  injuries, bruising, or red spots on the skin to your healthcare professional. If you are going to need surgery or other procedure, tell your healthcare professional that you are taking this medicine. Wear a medical ID bracelet or chain. Carry a card that describes your disease and details of your medicine and dosage times. What side effects may I notice from receiving this medicine? Side effects that you should report to your doctor or health care professional as soon as possible:  allergic reactions like skin rash, itching or hives, swelling of the face, lips, or tongue  signs and symptoms of bleeding such as bloody or black, tarry stools; red or dark-brown urine; spitting up blood or brown material that looks like coffee grounds; red spots on the skin; unusual bruising or bleeding from the eye, gums, or nose  signs and symptoms of a blood clot such as chest pain; shortness of breath; pain, swelling, or warmth in the leg  signs and symptoms of a stroke such as changes in vision; confusion; trouble speaking or understanding; severe headaches; sudden numbness or weakness of the face, arm or leg; trouble walking; dizziness; loss of coordination This list may not describe all possible side effects. Call your doctor for medical advice about side effects. You may report side effects to FDA at 1-800-FDA-1088. Where should I keep my medicine? Keep out of the reach of children. Store at room temperature between 20 and 25 degrees C (68 and 77 degrees F). Throw away any unused medicine after the expiration date. NOTE: This sheet is a summary. It may not cover all possible information. If you have questions about this medicine, talk to your doctor, pharmacist, or health care provider.  2021 Elsevier/Gold Standard (2020-05-10 16:54:11)

## 2020-10-13 LAB — CBC WITH DIFFERENTIAL/PLATELET
Absolute Monocytes: 440 cells/uL (ref 200–950)
Basophils Absolute: 60 cells/uL (ref 0–200)
Basophils Relative: 1.2 %
Eosinophils Absolute: 50 cells/uL (ref 15–500)
Eosinophils Relative: 1 %
HCT: 39 % (ref 35.0–45.0)
Hemoglobin: 12.5 g/dL (ref 11.7–15.5)
Lymphs Abs: 660 cells/uL — ABNORMAL LOW (ref 850–3900)
MCH: 28.2 pg (ref 27.0–33.0)
MCHC: 32.1 g/dL (ref 32.0–36.0)
MCV: 88 fL (ref 80.0–100.0)
MPV: 9.9 fL (ref 7.5–12.5)
Monocytes Relative: 8.8 %
Neutro Abs: 3790 cells/uL (ref 1500–7800)
Neutrophils Relative %: 75.8 %
Platelets: 489 10*3/uL — ABNORMAL HIGH (ref 140–400)
RBC: 4.43 10*6/uL (ref 3.80–5.10)
RDW: 15.1 % — ABNORMAL HIGH (ref 11.0–15.0)
Total Lymphocyte: 13.2 %
WBC: 5 10*3/uL (ref 3.8–10.8)

## 2020-10-13 LAB — COMPLETE METABOLIC PANEL WITH GFR
AG Ratio: 1.5 (calc) (ref 1.0–2.5)
ALT: 16 U/L (ref 6–29)
AST: 16 U/L (ref 10–35)
Albumin: 3.8 g/dL (ref 3.6–5.1)
Alkaline phosphatase (APISO): 53 U/L (ref 37–153)
BUN: 23 mg/dL (ref 7–25)
CO2: 27 mmol/L (ref 20–32)
Calcium: 9.8 mg/dL (ref 8.6–10.4)
Chloride: 104 mmol/L (ref 98–110)
Creat: 0.72 mg/dL (ref 0.60–0.88)
GFR, Est African American: 84 mL/min/{1.73_m2} (ref >=60)
GFR, Est Non African American: 73 mL/min/{1.73_m2} (ref >=60)
Globulin: 2.6 g/dL (calc) (ref 1.9–3.7)
Glucose, Bld: 99 mg/dL (ref 65–99)
Potassium: 4.7 mmol/L (ref 3.5–5.3)
Sodium: 138 mmol/L (ref 135–146)
Total Bilirubin: 0.8 mg/dL (ref 0.2–1.2)
Total Protein: 6.4 g/dL (ref 6.1–8.1)

## 2020-10-13 LAB — IRON,TIBC AND FERRITIN PANEL
%SAT: 27 % (calc) (ref 16–45)
Ferritin: 70 ng/mL (ref 16–288)
Iron: 77 ug/dL (ref 45–160)
TIBC: 289 mcg/dL (calc) (ref 250–450)

## 2020-10-17 ENCOUNTER — Ambulatory Visit
Admission: RE | Admit: 2020-10-17 | Discharge: 2020-10-17 | Disposition: A | Discharge status: Home / Self Care | Payer: Medicare Other | Source: Ambulatory Visit | Attending: Adult Health | Admitting: Adult Health

## 2020-10-17 DIAGNOSIS — E042 Nontoxic multinodular goiter: Secondary | ICD-10-CM

## 2020-10-17 DIAGNOSIS — E041 Nontoxic single thyroid nodule: Secondary | ICD-10-CM | POA: Diagnosis not present

## 2020-10-18 ENCOUNTER — Ambulatory Visit: Payer: Medicare Other | Attending: Physical Medicine & Rehabilitation | Admitting: Physical Therapy

## 2020-10-18 ENCOUNTER — Encounter: Payer: Self-pay | Admitting: Speech Pathology

## 2020-10-18 ENCOUNTER — Ambulatory Visit: Payer: Medicare Other | Admitting: Speech Pathology

## 2020-10-18 ENCOUNTER — Other Ambulatory Visit: Payer: Self-pay

## 2020-10-18 ENCOUNTER — Encounter: Payer: Self-pay | Admitting: Physical Therapy

## 2020-10-18 DIAGNOSIS — R41841 Cognitive communication deficit: Secondary | ICD-10-CM | POA: Diagnosis not present

## 2020-10-18 DIAGNOSIS — M6281 Muscle weakness (generalized): Secondary | ICD-10-CM | POA: Diagnosis not present

## 2020-10-18 DIAGNOSIS — I6389 Other cerebral infarction: Secondary | ICD-10-CM | POA: Diagnosis not present

## 2020-10-18 DIAGNOSIS — R262 Difficulty in walking, not elsewhere classified: Secondary | ICD-10-CM | POA: Insufficient documentation

## 2020-10-18 NOTE — Therapy (Signed)
[facility] [place] [address] [place], [place], [address] Phone: [phone]   Fax:  [phone]  Speech Language Pathology Evaluation  Patient Details  Name: Leslie [name] MRN: [id number] Date of Birth: Jun 28, 1927 Referring Provider (SLP): Marcello Fennel, MD   Encounter Date: 10/18/2020   End of Session - 10/18/20 1047    Visit Number 1    Number of Visits 8    Date for SLP Re-Evaluation 12/18/20    SLP Start Time 0950    SLP Stop Time  1040    SLP Time Calculation (min) 50 min    Activity Tolerance Patient tolerated treatment well           Past Medical History:  Diagnosis Date  . Anxiety disorder   . Glaucoma   . Hearing loss   . HTN (hypertension)   . Hyperglycemia   . Hyperlipidemia   . Vitamin D deficiency     Past Surgical History:  Procedure Laterality Date  . GLAUCOMA SURGERY  2013  . TONSILLECTOMY      There were no vitals filed for this visit.   Subjective Assessment - 10/18/20 1003    Currently in Pain? No/denies              SLP Evaluation [facility] - 10/18/20 1005      SLP Visit Information   SLP Received On 10/18/20    Referring Provider (SLP) Marcello Fennel, MD      Subjective   Patient/Family Stated Goal To be independent      General Information   HPI Patient is a 87 y.o. female with PMH: HTN, HLD, prediabetes and vitamin d deficiency who presented to ED from church after being noticed with slurred speech and left sided facial droop. At baseline, she lives alone and still drives, only takes vitamin d supplement but no other medications. 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      Balance Screen   Has the patient fallen in the past 6 months No    Has the patient had a decrease in activity level because of a fear of falling?  No    Is the patient reluctant to  leave their home because of a fear of falling?  No      Prior Functional Status   Cognitive/Linguistic Baseline Within functional limits    Type of Home House     Lives With Alone    Available Support Family;Friend(s);Personal assistant Retired      IT consultant   Overall Cognitive Status Impaired/Different from baseline    Area of [employer];Awareness    Memory Impaired    Memory Impairment Decreased recall of new information;Decreased short term memory    Awareness Impaired    Executive Function Reasoning;Organizing      Auditory Comprehension   Overall Auditory Comprehension Appears within functional limits for tasks assessed   Some hearing deficits noted.     Verbal Expression   Overall Verbal Expression Appears within functional limits for tasks assessed      Oral Motor/Sensory Function   Overall Oral Motor/Sensory Function Appears within functional limits for tasks assessed      Motor Speech   Overall Motor Speech Appears within functional limits for tasks assessed      Standardized Assessments   Standardized Assessments  Other Assessment  SLUMS   Other Assessment [place]      Individuals Consulted   Consulted and Agree with Results and Recommendations Patient;Family member/caregiver    Family Member [place], [place]            SLU Mental Status (SLUMS Examination)  Orientation: 2/3 Delayed Recall w/ Interference: 2/5 Numeric Calculation and Registration: 3/3 Immediate Recall w/ Interference (Generative naming): 1/3 Registration and Digit Span: 2/2 Visual Spatial/Exec Functioning: 4/6 Executive Functioning/Extrapolation:  4/8   Total score: 15/30   Medication Management 1/3 Independently 2/3 with minA  3/3 with modA           SLP Education - 10/18/20 1046    Education Details Provided edu on cognitive impairment and importance of compensatory strategies to assist with increased independence.             SLP Short Term Goals - 10/18/20 1108      SLP SHORT TERM GOAL #1   Title Pt will recall 3 external memory strategies to assist with recalling information at home.    Time 4    Period Weeks    Status New      SLP SHORT TERM GOAL #2   Title Pt will recall 3 strategies for to increase safety at home.    Time 4    Period Weeks    Status New      SLP SHORT TERM GOAL #3   Title Pt will independently organize medications into pill box.    Time 4    Period Weeks    Status New      SLP SHORT TERM GOAL #4   Title Further standardized assessment of cognitive skills    Time 2    Period Weeks    Status New            SLP Long Term Goals - 10/18/20 1101      SLP LONG TERM GOAL #1   Title Patient will demonstrate use of memory strategies to schedule activities, recall weekly events and items to maintain safety to participate socially in functional living environment    Time 8    Period Weeks    Status New      SLP LONG TERM GOAL #2   Title Patient will demonstrate use of organizational skills/strategies during daily living activities to improve safety and awareness in functional living environment    Time 8    Period Weeks    Status New            Plan - 10/18/20 1048    Clinical Impression Statement Pt is a 87 yo female status post R CVA. Pt family reports decreased memory and reduced ability to complete ADLs at home independently. Prior to assessment, pt reported she felt that she had no changes in her "thinking" after the CVA. SLP used the SLUMS to assess cognitive ability. Pt received a 15/30 indicating deficits in immediate and short term memory as well as executive functioning skills - planning and organization. SLP also assessed patient ability to complete medication management. Pt required min-modA to complete. She benefited from verbal cueing and repetition of information in different words to increase understanding. ALFA to be used to assess other functional skills. SLP  rec skilled speech services to address cognitive-linguistic impairment to limited caregiver burden and increase patient independence and safety.    Speech Therapy Frequency 1x /week    Treatment/Interventions Cueing hierarchy;Functional tasks;Patient/family education;Environmental controls;Cognitive reorganization;Multimodal communcation approach;Compensatory techniques;SLP instruction and feedback;Internal/external aids;Language  facilitation    Potential to Achieve Goals Good    Consulted and Agree with Plan of Care Patient;Family member/caregiver    Family Member [place], son           Patient will benefit from skilled therapeutic intervention in order to improve the following deficits and impairments:   Cognitive communication deficit    Problem List Patient Active Problem List   Diagnosis Date Noted  . Aortic atherosclerosis (HCC) 10/11/2020  . Pulmonary emphysema (HCC) 10/11/2020  . Acute blood loss anemia   . Slow transit constipation   . Hypoalbuminemia due to protein-calorie malnutrition (HCC)   . Chronic combined systolic and diastolic CHF (congestive heart failure) (HCC)   . Benign essential HTN   . Chronic low back pain without sciatica   . Embolic stroke (HCC) 10/03/2020  . CVA (cerebral vascular accident) (HCC) 10/01/2020  . DNR no code (do not resuscitate) 10/01/2020  . Prediabetes 10/01/2020  . Thrombocytosis 10/01/2020  . BMI 22.0-22.9, adult 05/15/2020  . CKD (chronic kidney disease) stage 3, GFR 30-59 ml/min (HCC) 05/15/2020  . Labile hypertension 06/16/2018  . FHx: heart disease 06/16/2018  . Former smoker 06/16/2018  . Abnormal glucose 06/16/2018  . Anxiety 03/04/2018  . Medication management 04/08/2014  . Vitamin D deficiency 07/01/2013  . Hyperlipidemia, mixed 07/01/2013  . LBBB (left bundle branch block) 02/20/2010    Dorena Bodo 10/18/2020, 11:12 AM  [facility] [address] [place], [place], [address] Phone: [phone]   Fax:  [phone]  Name: Leslie [name] MRN: [id number] Date of Birth: 1926/08/27

## 2020-10-18 NOTE — Therapy (Signed)
[facility] [address] [place], [place], [address] Phone: [phone]   Fax:  [phone]  Physical Therapy Evaluation  Patient Details  Name: Leslie [name] MRN: [id number] Date of Birth: July 12, 1927 Referring Provider (PT): Maryla Morrow   Encounter Date: 10/18/2020   PT End of Session - 10/18/20 0947    Visit Number 1    Date for PT Re-Evaluation 01/17/21    Authorization Type Medicare    PT Start Time 0915    PT Stop Time 0950    PT Time Calculation (min) 35 min    Activity Tolerance Patient tolerated treatment well    Behavior During Therapy [facility] for tasks assessed/performed           Past Medical History:  Diagnosis Date  . Anxiety disorder   . Glaucoma   . Hearing loss   . HTN (hypertension)   . Hyperglycemia   . Hyperlipidemia   . Vitamin D deficiency     Past Surgical History:  Procedure Laterality Date  . GLAUCOMA SURGERY  2013  . TONSILLECTOMY      There were no vitals filed for this visit.    Subjective Assessment - 10/18/20 0921    Subjective Patient is a 87 year old female with history of glaucoma, anxiety d/o, Vitamin D deficiency, chronic neck/back pain who was admitted on 10/01/2020 with slurred speech and facial droop after attending a church service.  CTA head/neck showed apparent occlusion of the innominate vein possibly due to extensive compression and multiple right thyroid nodules.  MRI brain showed several small/punctate infarcts in right frontoparietal vertex and small acute infarct left lateral cerebellum suggestive of embolic disease from heart or ascending aorta.  2D echo showed severely depressed LVEF with EF of 25% with akinesis of septum, inferior and apical wall and hypokinesis elsewhere.  BLE Dopplers were negative for DVT.  She was found to have evidence of UTI and was treated with fosfomycin. UCS ordered and showed >100,000 gram negative rods and pending.  She reports that she has not  had any falls since being home.  She currently denies any difficulty at home.  Her son in currently with her but will go back to his home soon.  He reports that she has been doing very well at home and has had no mobility or safety issues, reports that he is concerned about her memory    Currently in Pain? No/denies              [facility] PT Assessment - 10/18/20 0001      Assessment   Medical Diagnosis s/p CVA    Referring Provider (PT) Ankit Patel    Onset Date/Surgical Date 10/01/20    Prior Therapy in the hospital      Balance Screen   Has the patient fallen in the past 6 months No    Has the patient had a decrease in activity level because of a fear of falling?  No    Is the patient reluctant to leave their home because of a fear of falling?  No      Home Environment   Additional Comments lives alone, has stairs, does housework, some gardening      Prior Function   Level of Independence Independent    Vocation Retired    Leisure walks5 days a week 30 minutes      ROM / Strength   AROM / PROM / Cabin crew  Strength   Strength Assessment Site Hip;Knee;Ankle    Right/Left Hip Left    Left Hip Flexion 3+/5    Left Hip Extension 3+/5    Left Hip ABduction 3+/5    Right/Left Knee Left    Left Knee Flexion 4/5    Left Knee Extension 4/5    Right/Left Ankle Left    Left Ankle Dorsiflexion 4-/5    Left Ankle Plantar Flexion 4-/5    Left Ankle Inversion 4-/5      Transfers   Comments can transfer without using hands      Ambulation/Gait   Gait Comments no device, able to do stairs step over step with a hand rail, caught heel once      Standardized Balance Assessment   Standardized Balance Assessment Berg Balance Test      Berg Balance Test   Sit to Stand Able to stand without using hands and stabilize independently    Standing Unsupported Able to stand safely 2 minutes    Sitting with Back Unsupported but Feet Supported on Floor or Stool Able to sit safely  and securely 2 minutes    Stand to Sit Sits safely with minimal use of hands    Transfers Able to transfer safely, minor use of hands    Standing Unsupported with Eyes Closed Able to stand 10 seconds safely    Standing Unsupported with Feet Together Able to place feet together independently and stand 1 minute safely    From Standing, Reach Forward with Outstretched Arm Can reach confidently >25 cm (10")    From Standing Position, Pick up Object from Floor Able to pick up shoe safely and easily    From Standing Position, Turn to Look Behind Over each Shoulder Turn sideways only but maintains balance    Turn 360 Degrees Able to turn 360 degrees safely but slowly    Standing Unsupported, Alternately Place Feet on Step/Stool Able to stand independently and complete 8 steps >20 seconds    Standing Unsupported, One Foot in Front Able to take small step independently and hold 30 seconds    Standing on One Leg Tries to lift leg/unable to hold 3 seconds but remains standing independently    Total Score 46                      Objective measurements completed on examination: See above findings.                 PT Short Term Goals - 10/18/20 0951      PT SHORT TERM GOAL #1   Title independent with HEP    Time 4    Period Weeks    Status New             PT Long Term Goals - 10/18/20 0951      PT LONG TERM GOAL #1   Title increase Berg balance test score tpo 49/56    Time 12    Period Weeks    Status New      PT LONG TERM GOAL #2   Title understand safety and fall risks at home    Time 12    Period Weeks    Status New      PT LONG TERM GOAL #3   Title resume her 30 minute walks at home    Time 12    Period Weeks    Status New      PT LONG TERM GOAL #  4   Title increase left hip strength to 4/5    Time 12    Period Weeks    Status New                  Plan - 10/18/20 0948    Clinical Impression Statement Patient is a 87 year old female  with history of glaucoma, anxiety d/o, Vitamin D deficiency, chronic neck/back pain who was admitted on 10/01/2020 with slurred speech and facial droop after attending a church service.  CTA head/neck showed apparent occlusion of the innominate vein possibly due to extensive compression and multiple right thyroid nodules.  MRI brain showed several small/punctate infarcts in right frontoparietal vertex and small acute infarct left lateral cerebellum suggestive of embolic disease from heart or ascending aorta.  2D echo showed severely depressed LVEF with EF of 25% with akinesis of septum, inferior and apical wall and hypokinesis elsewhere.  BLE Dopplers were negative for DVT.  She was found to have evidence of UTI and was treated with fosfomycin. UCS ordered and showed >100,000 gram negative rods and pending.  She has been home the past 10 days and with her son.  She is doing well per him and doing a daily walk, his biggest concern is her memory.  I checked her balance and she was a 46/56 for Berg balance putting her and a risk for falls, she lost her balance needing Mod A to correct with the higher level activities, she was weak in the left hip, causing a lsight limp on the left    Stability/Clinical Decision Making Evolving/Moderate complexity    Clinical Decision Making Low    Rehab Potential Good    PT Frequency 1x / week    PT Duration 12 weeks    PT Treatment/Interventions ADLs/Self Care Home Management;Gait training;Neuromuscular re-education;Balance training;Therapeutic exercise;Therapeutic activities;Functional mobility training;Stair training;Patient/family education    PT Next Visit Plan PT to work on her left LE strength and her balance, I feel that this will maximize her independence and safety    Consulted and Agree with Plan of Care Patient;Family member/caregiver    Family Member Consulted son           Patient will benefit from skilled therapeutic intervention in order to improve the  following deficits and impairments:  Difficulty walking,Decreased balance,Decreased strength  Visit Diagnosis: Cerebrovascular accident (CVA) due to other mechanism [facility]) - Plan: PT plan of care cert/re-cert  Muscle weakness (generalized) - Plan: PT plan of care cert/re-cert  Difficulty in walking, not elsewhere classified - Plan: PT plan of care cert/re-cert     Problem List Patient Active Problem List   Diagnosis Date Noted  . Aortic atherosclerosis (HCC) 10/11/2020  . Pulmonary emphysema (HCC) 10/11/2020  . Acute blood loss anemia   . Slow transit constipation   . Hypoalbuminemia due to protein-calorie malnutrition (HCC)   . Chronic combined systolic and diastolic CHF (congestive heart failure) (HCC)   . Benign essential HTN   . Chronic low back pain without sciatica   . Embolic stroke (HCC) 10/03/2020  . CVA (cerebral vascular accident) (HCC) 10/01/2020  . DNR no code (do not resuscitate) 10/01/2020  . Prediabetes 10/01/2020  . Thrombocytosis 10/01/2020  . BMI 22.0-22.9, adult 05/15/2020  . CKD (chronic kidney disease) stage 3, GFR 30-59 ml/min (HCC) 05/15/2020  . Labile hypertension 06/16/2018  . FHx: heart disease 06/16/2018  . Former smoker 06/16/2018  . Abnormal glucose 06/16/2018  . Anxiety 03/04/2018  . Medication management 04/08/2014  .  Vitamin D deficiency 07/01/2013  . Hyperlipidemia, mixed 07/01/2013  . LBBB (left bundle branch block) 02/20/2010    Jearld Lesch., PT 10/18/2020, 9:54 AM  [facility] [address] [place], [place], [address] Phone: [phone]   Fax:  [phone]  Name: Leslie [name] MRN: [id number] Date of Birth: 1926-12-28

## 2020-10-24 ENCOUNTER — Encounter: Payer: Self-pay | Admitting: Speech Pathology

## 2020-10-24 ENCOUNTER — Ambulatory Visit: Payer: Medicare Other | Admitting: Speech Pathology

## 2020-10-24 ENCOUNTER — Other Ambulatory Visit: Payer: Self-pay

## 2020-10-24 DIAGNOSIS — M6281 Muscle weakness (generalized): Secondary | ICD-10-CM | POA: Diagnosis not present

## 2020-10-24 DIAGNOSIS — I6389 Other cerebral infarction: Secondary | ICD-10-CM | POA: Diagnosis not present

## 2020-10-24 DIAGNOSIS — R41841 Cognitive communication deficit: Secondary | ICD-10-CM

## 2020-10-24 DIAGNOSIS — R262 Difficulty in walking, not elsewhere classified: Secondary | ICD-10-CM | POA: Diagnosis not present

## 2020-10-24 NOTE — Therapy (Signed)
[facility] [address] [place], [place], [address] Phone: [phone]   Fax:  [phone]  Speech Language Pathology Treatment  Patient Details  Name: Leslie [name] MRN: [id number] Date of Birth: 1926/07/17 Referring Provider (SLP): Marcello Fennel, MD   Encounter Date: 10/24/2020   End of Session - 10/24/20 0934    Visit Number 2    Number of Visits 8    Date for SLP Re-Evaluation 12/18/20    SLP Start Time 0930    SLP Stop Time  1010    SLP Time Calculation (min) 40 min    Activity Tolerance Patient tolerated treatment well           Past Medical History:  Diagnosis Date  . Anxiety disorder   . Glaucoma   . Hearing loss   . HTN (hypertension)   . Hyperglycemia   . Hyperlipidemia   . Vitamin D deficiency     Past Surgical History:  Procedure Laterality Date  . GLAUCOMA SURGERY  2013  . TONSILLECTOMY      There were no vitals filed for this visit.   Subjective Assessment - 10/24/20 0933    Subjective "I don't remember if I have physical therapy."    Currently in Pain? No/denies                 ADULT SLP TREATMENT - 10/24/20 0955      General Information   Behavior/Cognition Alert;Cooperative;Pleasant mood      Treatment Provided   Treatment provided Cognitive-Linquistic      Cognitive-Linquistic Treatment   Treatment focused on Cognition    Skilled Treatment Pt completed ALFA assessment subtests: Medication 80%, Calendar 90% and Reading directions 90%. Pt reportedly has trouble with memory, only uses the microwave to cook, and does not drive. Pt reported she takes her medicine first thing in the morning (after breakfast) and right before bed. SLP trained patient in memory strategies with minA.      Assessment / Recommendations / Plan   Plan Continue with current plan of care      Progression Toward Goals   Progression toward goals Progressing toward goals            SLP Education -  10/24/20 0959    Education Details Memory strategies.    Person(s) Educated Patient    Methods Explanation    Comprehension Verbalized understanding            SLP Short Term Goals - 10/24/20 0935      SLP SHORT TERM GOAL #1   Title Pt will recall 3 external memory strategies to assist with recalling information at home.    Time 4    Period Weeks    Status On-going      SLP SHORT TERM GOAL #2   Title Pt will recall 3 strategies for to increase safety at home.    Time 4    Period Weeks    Status On-going      SLP SHORT TERM GOAL #3   Title Pt will independently organize medications into pill box.    Time 4    Period Weeks    Status On-going      SLP SHORT TERM GOAL #4   Title Further standardized assessment of cognitive skills    Time 2    Period Weeks    Status On-going            SLP Long Term Goals -  10/24/20 0935      SLP LONG TERM GOAL #1   Title Patient will demonstrate use of memory strategies to schedule activities, recall weekly events and items to maintain safety to participate socially in functional living environment    Time 8    Period Weeks    Status On-going      SLP LONG TERM GOAL #2   Title Patient will demonstrate use of organizational skills/strategies during daily living activities to improve safety and awareness in functional living environment    Time 8    Period Weeks    Status On-going            Plan - 10/24/20 0935    Clinical Impression Statement Pt is a 87 yo female status post R CVA. Pt family reports decreased memory and reduced ability to complete ADLs at home independently. Prior to assessment, pt reported she felt that she had no changes in her "thinking" after the CVA. SLP used the SLUMS to assess cognitive ability. Pt received a 15/30 indicating deficits in immediate and short term memory as well as executive functioning skills - planning and organization. SLP also assessed patient ability to complete medication management.  Pt required min-modA to complete. She benefited from verbal cueing and repetition of information in different words to increase understanding. ALFA to be used to assess other functional skills. SLP rec skilled speech services to address cognitive-linguistic impairment to limited caregiver burden and increase patient independence and safety.    Speech Therapy Frequency 1x /week    Treatment/Interventions Cueing hierarchy;Functional tasks;Patient/family education;Environmental controls;Cognitive reorganization;Multimodal communcation approach;Compensatory techniques;SLP instruction and feedback;Internal/external aids;Language facilitation    Potential to Achieve Goals Good    Consulted and Agree with Plan of Care Patient;Family member/caregiver    Family Member [place], son           Patient will benefit from skilled therapeutic intervention in order to improve the following deficits and impairments:   Cognitive communication deficit    Problem List Patient Active Problem List   Diagnosis Date Noted  . Aortic atherosclerosis (HCC) 10/11/2020  . Pulmonary emphysema (HCC) 10/11/2020  . Acute blood loss anemia   . Slow transit constipation   . Hypoalbuminemia due to protein-calorie malnutrition (HCC)   . Chronic combined systolic and diastolic CHF (congestive heart failure) (HCC)   . Benign essential HTN   . Chronic low back pain without sciatica   . Embolic stroke (HCC) 10/03/2020  . CVA (cerebral vascular accident) (HCC) 10/01/2020  . DNR no code (do not resuscitate) 10/01/2020  . Prediabetes 10/01/2020  . Thrombocytosis 10/01/2020  . BMI 22.0-22.9, adult 05/15/2020  . CKD (chronic kidney disease) stage 3, GFR 30-59 ml/min (HCC) 05/15/2020  . Labile hypertension 06/16/2018  . FHx: heart disease 06/16/2018  . Former smoker 06/16/2018  . Abnormal glucose 06/16/2018  . Anxiety 03/04/2018  . Medication management 04/08/2014  . Vitamin D deficiency 07/01/2013  .  Hyperlipidemia, mixed 07/01/2013  . LBBB (left bundle branch block) 02/20/2010    Michelene Gardener [place] MS, [place], CBIS  10/24/2020, 10:00 AM  [facility] [address] [place], [place], [address] Phone: [phone]   Fax:  [phone]   Name: ELAYNA TOBLER MRN: [id number] Date of Birth: 1926-12-28

## 2020-10-31 ENCOUNTER — Ambulatory Visit: Payer: Medicare Other | Admitting: Physical Therapy

## 2020-10-31 ENCOUNTER — Other Ambulatory Visit: Payer: Self-pay

## 2020-10-31 ENCOUNTER — Ambulatory Visit: Payer: Medicare Other | Admitting: Speech Pathology

## 2020-10-31 ENCOUNTER — Encounter: Payer: Self-pay | Admitting: Physical Therapy

## 2020-10-31 ENCOUNTER — Encounter: Payer: Self-pay | Admitting: Speech Pathology

## 2020-10-31 DIAGNOSIS — R41841 Cognitive communication deficit: Secondary | ICD-10-CM | POA: Diagnosis not present

## 2020-10-31 DIAGNOSIS — I6389 Other cerebral infarction: Secondary | ICD-10-CM | POA: Diagnosis not present

## 2020-10-31 DIAGNOSIS — R262 Difficulty in walking, not elsewhere classified: Secondary | ICD-10-CM

## 2020-10-31 DIAGNOSIS — M6281 Muscle weakness (generalized): Secondary | ICD-10-CM

## 2020-10-31 NOTE — Therapy (Signed)
[facility] [address] [place], [place], [address] Phone: [phone]   Fax:  [phone]  Speech Language Pathology Treatment  Patient Details  Name: Leslie [name] MRN: [id number] Date of Birth: 22-Dec-1926 Referring Provider (SLP): Marcello Fennel, MD   Encounter Date: 10/31/2020   End of Session - 10/31/20 1622    Visit Number 3    Number of Visits 8    Date for SLP Re-Evaluation 12/18/20    SLP Start Time 1615    SLP Stop Time  1656    SLP Time Calculation (min) 41 min    Activity Tolerance Patient tolerated treatment well           Past Medical History:  Diagnosis Date  . Anxiety disorder   . Glaucoma   . Hearing loss   . HTN (hypertension)   . Hyperglycemia   . Hyperlipidemia   . Vitamin D deficiency     Past Surgical History:  Procedure Laterality Date  . GLAUCOMA SURGERY  2013  . TONSILLECTOMY      There were no vitals filed for this visit.   Subjective Assessment - 10/31/20 1619    Subjective Pt reports she did not have a chance to practice any of her memory strategies. Reportedly "had no moments of forgetfulness".    Currently in Pain? No/denies                     SLP Short Term Goals - 10/31/20 1623      SLP SHORT TERM GOAL #1   Title Pt will recall 3 external memory strategies to assist with recalling information at home.    Time 3    Period Weeks    Status On-going      SLP SHORT TERM GOAL #2   Title Pt will recall 3 strategies for to increase safety at home.    Time 3    Period Weeks    Status On-going      SLP SHORT TERM GOAL #3   Title Pt will independently organize medications into pill box.    Time 3    Period Weeks    Status On-going      SLP SHORT TERM GOAL #4   Title Further standardized assessment of cognitive skills    Time 2    Period Weeks    Status Achieved   last session           SLP Long Term Goals - 10/31/20 1623      SLP LONG TERM GOAL #1    Title Patient will demonstrate use of memory strategies to schedule activities, recall weekly events and items to maintain safety to participate socially in functional living environment    Time 8    Period Weeks    Status On-going      SLP LONG TERM GOAL #2   Title Patient will demonstrate use of organizational skills/strategies during daily living activities to improve safety and awareness in functional living environment    Time 8    Period Weeks    Status On-going            Plan - 10/31/20 1622    Clinical Impression Statement Pt is a 87 yo female status post R CVA. Pt family reports decreased memory and reduced ability to complete ADLs at home independently. Prior to assessment, pt reported she felt that she had no changes in her "thinking" after the CVA. SLP  used the SLUMS to assess cognitive ability. Pt received a 15/30 indicating deficits in immediate and short term memory as well as executive functioning skills - planning and organization. SLP also assessed patient ability to complete medication management. Pt required min-modA to complete. She benefited from verbal cueing and repetition of information in different words to increase understanding. ALFA to be used to assess other functional skills. SLP rec skilled speech services to address cognitive-linguistic impairment to limited caregiver burden and increase patient independence and safety.    Speech Therapy Frequency 1x /week    Treatment/Interventions Cueing hierarchy;Functional tasks;Patient/family education;Environmental controls;Cognitive reorganization;Multimodal communcation approach;Compensatory techniques;SLP instruction and feedback;Internal/external aids;Language facilitation    Potential to Achieve Goals Good    Consulted and Agree with Plan of Care Patient;Family member/caregiver    Family Member Bryson, son           Patient will benefit from skilled therapeutic intervention in order to improve the  following deficits and impairments:   Cognitive communication deficit    Problem List Patient Active Problem List   Diagnosis Date Noted  . Aortic atherosclerosis (HCC) 10/11/2020  . Pulmonary emphysema (HCC) 10/11/2020  . Acute blood loss anemia   . Slow transit constipation   . Hypoalbuminemia due to protein-calorie malnutrition (HCC)   . Chronic combined systolic and diastolic CHF (congestive heart failure) (HCC)   . Benign essential HTN   . Chronic low back pain without sciatica   . Embolic stroke (HCC) 10/03/2020  . CVA (cerebral vascular accident) (HCC) 10/01/2020  . DNR no code (do not resuscitate) 10/01/2020  . Prediabetes 10/01/2020  . Thrombocytosis 10/01/2020  . BMI 22.0-22.9, adult 05/15/2020  . CKD (chronic kidney disease) stage 3, GFR 30-59 ml/min (HCC) 05/15/2020  . Labile hypertension 06/16/2018  . FHx: heart disease 06/16/2018  . Former smoker 06/16/2018  . Abnormal glucose 06/16/2018  . Anxiety 03/04/2018  . Medication management 04/08/2014  . Vitamin D deficiency 07/01/2013  . Hyperlipidemia, mixed 07/01/2013  . LBBB (left bundle branch block) 02/20/2010    Michelene Gardener Larose MS, [place], CBIS  10/31/2020, 4:53 PM  [facility] [address] [place], [place], [address] Phone: [phone]   Fax:  [phone]   Name: TASHEBA HENSON MRN: [id number] Date of Birth: 1926-11-08

## 2020-10-31 NOTE — Therapy (Signed)
[facility] [address] [place], [place], [address] Phone: [phone]   Fax:  [phone]  Physical Therapy Treatment  Patient Details  Name: Leslie [name] MRN: [id number] Date of Birth: 01-07-1927 Referring Provider (PT): Maryla Morrow   Encounter Date: 10/31/2020   PT End of Session - 10/31/20 1557    Visit Number 2    Date for PT Re-Evaluation 01/17/21    Authorization Type Medicare    PT Start Time 1515    PT Stop Time 1557    PT Time Calculation (min) 42 min    Activity Tolerance Patient tolerated treatment well    Behavior During Therapy [facility] for tasks assessed/performed           Past Medical History:  Diagnosis Date  . Anxiety disorder   . Glaucoma   . Hearing loss   . HTN (hypertension)   . Hyperglycemia   . Hyperlipidemia   . Vitamin D deficiency     Past Surgical History:  Procedure Laterality Date  . GLAUCOMA SURGERY  2013  . TONSILLECTOMY      There were no vitals filed for this visit.   Subjective Assessment - 10/31/20 1516    Subjective Pt reports that she is doing fine    Currently in Pain? No/denies                             [facility] Adult PT Treatment/Exercise - 10/31/20 0001      High Level Balance   High Level Balance Activities Side stepping;Backward walking      Exercises   Exercises Lumbar      Lumbar Exercises: Aerobic   Nustep L3 x 6 min      Lumbar Exercises: Standing   Other Standing Lumbar Exercises Alt 4'' box taps 2x10      Lumbar Exercises: Seated   Long Arc Quad on Chair Strengthening;Both;2 sets    Sit to Stand 5 reps   x2   Other Seated Lumbar Exercises HS curls red 2x10, seated march 2lb 2x10    Other Seated Lumbar Exercises Ball squeezes 2x10                    PT Short Term Goals - 10/18/20 0951      PT SHORT TERM GOAL #1   Title independent with HEP    Time 4    Period Weeks    Status New             PT Long Term  Goals - 10/18/20 0951      PT LONG TERM GOAL #1   Title increase Berg balance test score tpo 49/56    Time 12    Period Weeks    Status New      PT LONG TERM GOAL #2   Title understand safety and fall risks at home    Time 12    Period Weeks    Status New      PT LONG TERM GOAL #3   Title resume her 30 minute walks at home    Time 12    Period Weeks    Status New      PT LONG TERM GOAL #4   Title increase left hip strength to 4/5    Time 12    Period Weeks    Status New  Plan - 10/31/20 1557    Clinical Impression Statement Pt tolerated an initial progression to TE well evident by no subjective reports of increase pain. Cues to complete the full ROM with LAQ and HS curls. SBA given with all balance activities. Cues not to allow LE to push against mat table with sit to stands.    Stability/Clinical Decision Making Evolving/Moderate complexity    Rehab Potential Good    PT Frequency 1x / week    PT Duration 12 weeks    PT Treatment/Interventions ADLs/Self Care Home Management;Gait training;Neuromuscular re-education;Balance training;Therapeutic exercise;Therapeutic activities;Functional mobility training;Stair training;Patient/family education    PT Next Visit Plan PT to work on her left LE strength and her balance, I feel that this will maximize her independence and safety           Patient will benefit from skilled therapeutic intervention in order to improve the following deficits and impairments:  Difficulty walking,Decreased balance,Decreased strength  Visit Diagnosis: Cerebrovascular accident (CVA) due to other mechanism (HCC)  Muscle weakness (generalized)  Difficulty in walking, not elsewhere classified  Cognitive communication deficit     Problem List Patient Active Problem List   Diagnosis Date Noted  . Aortic atherosclerosis (HCC) 10/11/2020  . Pulmonary emphysema (HCC) 10/11/2020  . Acute blood loss anemia   . Slow transit  constipation   . Hypoalbuminemia due to protein-calorie malnutrition (HCC)   . Chronic combined systolic and diastolic CHF (congestive heart failure) (HCC)   . Benign essential HTN   . Chronic low back pain without sciatica   . Embolic stroke (HCC) 10/03/2020  . CVA (cerebral vascular accident) (HCC) 10/01/2020  . DNR no code (do not resuscitate) 10/01/2020  . Prediabetes 10/01/2020  . Thrombocytosis 10/01/2020  . BMI 22.0-22.9, adult 05/15/2020  . CKD (chronic kidney disease) stage 3, GFR 30-59 ml/min (HCC) 05/15/2020  . Labile hypertension 06/16/2018  . FHx: heart disease 06/16/2018  . Former smoker 06/16/2018  . Abnormal glucose 06/16/2018  . Anxiety 03/04/2018  . Medication management 04/08/2014  . Vitamin D deficiency 07/01/2013  . Hyperlipidemia, mixed 07/01/2013  . LBBB (left bundle branch block) 02/20/2010    Leslie [name] 10/31/2020, 4:00 PM  [facility] [address] [place], [place], [address] Phone: [phone]   Fax:  [phone]  Name: Leslie [name] MRN: [id number] Date of Birth: 1926-11-25

## 2020-11-08 ENCOUNTER — Encounter: Payer: Self-pay | Admitting: Adult Health

## 2020-11-08 ENCOUNTER — Other Ambulatory Visit: Payer: Self-pay

## 2020-11-08 ENCOUNTER — Ambulatory Visit (INDEPENDENT_AMBULATORY_CARE_PROVIDER_SITE_OTHER): Payer: Medicare Other | Admitting: Adult Health

## 2020-11-08 VITALS — BP 142/92 | HR 75 | Ht 62.0 in | Wt 110.0 lb

## 2020-11-08 DIAGNOSIS — I69319 Unspecified symptoms and signs involving cognitive functions following cerebral infarction: Secondary | ICD-10-CM

## 2020-11-08 DIAGNOSIS — I429 Cardiomyopathy, unspecified: Secondary | ICD-10-CM

## 2020-11-08 DIAGNOSIS — I639 Cerebral infarction, unspecified: Secondary | ICD-10-CM

## 2020-11-08 MED ORDER — APIXABAN 2.5 MG PO TABS: 2.5000 mg | ORAL_TABLET | Freq: Two times a day (BID) | ORAL | 2 refills | Status: DC

## 2020-11-08 MED ORDER — METOPROLOL SUCCINATE ER 25 MG PO TB24: 25.0000 mg | ORAL_TABLET | Freq: Every day | ORAL | 0 refills | Status: DC

## 2020-11-08 MED ORDER — ATORVASTATIN CALCIUM 20 MG PO TABS: 20.0000 mg | ORAL_TABLET | Freq: Every day | ORAL | 3 refills | Status: DC

## 2020-11-08 NOTE — Progress Notes (Signed)
[facility] [address] [place]. [place] [address] [phone]       HOSPITAL FOLLOW UP NOTE  Leslie [name] Date of Birth:  Aug 06, 1926 Medical Record Number:  [id number]   Reason for Referral:  hospital stroke follow up    SUBJECTIVE:   CHIEF COMPLAINT:  Chief Complaint  Patient presents with  . Follow-up    RM 14 with son (Leslie [name]) Pt [name] well, no symptoms, no complaints from pt Short term memory complications per son     HPI:   Leslie Leslie [name] a 87 y.o. female with history of HTN, HLD, pre DMII, glaucoma, and Vit D deficiency who presented on 10/01/2020 with slurred speech with a left facial droop.  Personally reviewed hospitalization pertinent progress notes, lab work and imaging summary provided.  Stroke work-up revealed small left cerebellar and several punctate right frontal parietal cortex infarcts, etiology likely due to cardiomyopathy with low EF although occult A. Fib also possibility.  Recommended initiating Eliquis 2.5 mg twice daily in setting of cardiomyopathy with a EF of 25% and recommended if EF improves after 3 months can switch back to antiplatelet therapy.  Advised to follow-up with cardiology outpatient.  HTN stable.  LDL 78 and initiate atorvastatin 20 mg daily.  Other stroke risk factors include advanced age.  Also treated for UTI with culture positive for E. Coli.  Evaluated by therapies who recommended discharge to CIR for ongoing therapy needs.  Stroke: Small left cerebellum and several punctate right frontoparietal vertex infarcts, etiology likely due to cardiomyopathy with low EF.  Occult A. fib also possible.  Code Stroke CT head No acute abnormality. Atrophy and chronic small-vessel ischemic changes. ASPECTS 10.   CTA head & neck Aortic atherosclerosis and ectasia.  No intracranial large vessel occlusion. Occlusion of the innominate vein  MRI  Several small/punctate acute infarctions at the right frontoparietal vertex. Small  acute infarction at the left lateral cerebellum.  LE venous dopplers: negative for DVT  2D Echo EF 25%, severely depressed LVEF with akinesis of the septum, inferior and apical walls, hypokinesis elsewhere.   LDL 78  HgbA1c 5.8  VTE prophylaxis - Lovenox  Diet: heart healthy  No antithrombotic prior to admission, now on aspirin 81 mg daily and clopidogrel 75 mg daily.  Will start Eliquis 2.5mg  bid given her cardiomyopathy and recent stroke and s/p ASA and plavix.  If her EF improves after 3 months, switch back to antiplatelet therapy.    Therapy recommendations:  CIR  Disposition:  d/c to CIR on 10/03/2020   Today, 11/08/2020, Leslie [name] being seen for hospital follow-up accompanied by her son  She was discharged home from [facility] on 10/10/2020 with recommended outpatient therapies.  Currently working with outpatient therapy for residual mild cognitive impairment with short-term memory loss and mild LLE weakness but overall greatly improving.  Ambulates without assistive device and denies any recent falls.  She continues to live independently with family checking in on her routinely maintaining ADLs and IADLs independently without difficulty.  She questions return to driving as she was driving previously. Denies new stroke/TIA symptoms  Reports compliance on Eliquis 2.5 mg twice daily with mild bruising but no bleeding as well as atorvastatin 20 mg daily without associated side effects.  Blood pressure today 142/92.  Routinely monitors at home and typically 110-120/90s.  Has scheduled follow-up with cardiology 5/18 She was seen by her PCP on 3/31  Son [name] requesting medication refills that she was discharged  on from hospital  No further concerns at this time     ROS:   14 system review of systems performed and negative with exception of those listed in HPI  PMH:  Past Medical History:  Diagnosis Date  . Anxiety disorder   . Glaucoma   . Hearing loss   . HTN (hypertension)    . Hyperglycemia   . Hyperlipidemia   . Vitamin D deficiency     PSH:  Past Surgical History:  Procedure Laterality Date  . GLAUCOMA SURGERY  2013  . TONSILLECTOMY      Social History:  Social History   Socioeconomic History  . Marital status: Married    Spouse name: Not on file  . Number of children: Not on file  . Years of education: Not on file  . Highest education level: Not on file  Occupational History  . Not on file  Tobacco Use  . Smoking status: Former Smoker    Quit date: 07/16/1979    Years since quitting: 41.3  . Smokeless tobacco: Never Used  Vaping Use  . Vaping Use: Never used  Substance and Sexual Activity  . Alcohol use: No    Alcohol/week: 0.0 standard drinks  . Drug use: No  . Sexual activity: Not Currently  Other Topics Concern  . Not on file  Social History Narrative  . Not on file   Social Determinants of Health   Financial Resource Strain: Not on file  Food Insecurity: Not on file  Transportation Needs: Not on file  Physical Activity: Not on file  Stress: Not on file  Social Connections: Not on file  Intimate Partner Violence: Not on file    Family History:  Family History  Problem Relation Age of Onset  . Heart attack Brother   . Heart disease Brother   . Heart attack Brother   . Heart disease Brother   . Hypertension Mother   . Stroke Mother   . Heart attack Father   . Diabetes Father     Medications:   Current Outpatient Medications on File Prior to Visit  Medication Sig Dispense Refill  . latanoprost (XALATAN) 0.005 % ophthalmic solution Place 1 drop into both eyes daily.    Marland Kitchen LORazepam (ATIVAN) 0.5 MG tablet Take 0.5 mg by mouth every 8 (eight) hours.    . polyethylene glycol (MIRALAX / GLYCOLAX) 17 g packet Take 17 g by mouth 2 (two) times daily. 60 each 0   No current facility-administered medications on file prior to visit.    Allergies:   Allergies  Allergen Reactions  . Adhesive [Tape]   . Advil [Ibuprofen]    . Atenolol       OBJECTIVE:  Physical Exam  Vitals:   11/08/20 0903  BP: (!) 142/92  Pulse: 75  Weight: 110 lb (49.9 kg)  Height: 5\' 2"  (1.575 m)   Body mass index [name] 20.12 kg/m. No exam data present  Post stroke PHQ 2/9 Depression screen PHQ 2/9 11/08/2020  Decreased Interest 0  Down, Depressed, Hopeless 0  PHQ - 2 Score 0     General: Frail very pleasant elderly Caucasian female, seated, in no evident distress Head: head normocephalic and atraumatic.   Neck: supple with no carotid or supraclavicular bruits Cardiovascular: regular rate and rhythm, no murmurs Musculoskeletal: no deformity Skin:  no rash/petichiae Vascular:  Normal pulses all extremities   Neurologic Exam Mental Status: Awake and fully alert.   Fluent speech and language.  Oriented to  place and time. Recent memory impaired and remote memory intact. Attention span, concentration and fund of knowledge appropriate during visit. Mood and affect appropriate.  Recall: 2/3; serial addition good, named 9 4-legged animals in 60 seconds.  Cranial Nerves: Fundoscopic exam reveals sharp disc margins. Pupils equal, briskly reactive to light. Extraocular movements full without nystagmus. Visual fields full to confrontation.  HOH bilaterally. Facial sensation intact. Face, tongue, palate moves normally and symmetrically.  Motor: Normal bulk and tone. Normal strength in all tested extremity muscles except mild left hip flexor and ankle dorsiflexion weakness Sensory.: intact to touch , pinprick , position and vibratory sensation.  Coordination: Rapid alternating movements normal in all extremities. Finger-to-nose and heel-to-shin performed accurately bilaterally. Gait and Station: Arises from chair without difficulty. Stance [name] normal. Gait demonstrates normal stride length with mild imbalance without use of assistive device. Tandem walk and heel toe not attempted.  Reflexes: 1+ and symmetric. Toes downgoing.     NIHSS   0 Modified Rankin  2      ASSESSMENT: Leslie Leslie [name] a 87 y.o. year old female presented with slurred speech and left facial droop on 10/01/2020 with stroke work-up revealing small left cerebellum and several punctate right frontoparietal cortex infarcts, likely secondary to cardiomyopathy with low EF of 25%. Vascular risk factors include cardiomyopathy, HTN, HLD, CHF and advanced age.      PLAN:  1. L cerebellum and R frontoparietal infarcts:  a. Residual deficit: Cognitive deficit with short-term memory loss and mild LLE weakness.  Overall greatly improving and encouraged continued participation with outpatient PT/SLP.  Due to continued cognitive deficits, would not recommend return to driving quite yet until further recovery more so for possible impairment of multitasking and reaction time - will reach out to her current speech therapist to see if she [name] able to further evaluate b. Continue Eliquis (apixaban) daily  and atorvastatin for secondary stroke prevention.   c. Discussed secondary stroke prevention measures and importance of close PCP follow up for aggressive stroke risk factor management  2. HTN: BP goal <130/90.  Slightly elevated today on metoprolol - refill provided today but request future refills by cardiology or PCP 3. HLD: LDL goal <70. Recent LDL 78 - started atorvastatin 20 mg daily during recent admission.  Request follow-up with PCP in the next 1 to 2 months for repeat lipid panel.  Refill provided today but request future refills provided by PCP 4. Cardiomyopathy: Recent 2D echo showed EF 25%.  Continue Eliquis and her recent hospitalization, consider switching back to antiplatelet therapy if EF improves to >35% after 3 months.  Has follow-up with cardiology on 5/18.  Eliquis refilled per request further refills by cardiology    Follow up in 3 months or call earlier if needed   CC:  [facility] provider: Dr. Pearlean Brownie PCP: Lucky Cowboy, MD    I spent 45 minutes of  face-to-face and non-face-to-face time with patient and son.  This included previsit chart review including recent hospitalization pertinent progress notes, lab work and imaging, lab review, study review, order entry, electronic health record documentation, patient education regarding recent stroke and etiology, residual deficits and further recovery, importance of managing stroke risk factors and answered all other questions to patient and sons satisfaction  Ihor Austin, AGNP-BC  [facility] [address] [place], [place] [address]  Phone [phone] Fax [phone] Note: This document was prepared with digital dictation and possible smart phrase technology. Any transcriptional errors that result from this  process are unintentional.

## 2020-11-08 NOTE — Patient Instructions (Signed)
Continue working with outpatient therapies for hopeful further recovery  Continue Eliquis (apixaban) daily  and atorvastatin for secondary stroke prevention  Ensure follow-up with cardiology as scheduled on 5/18  Continue to follow up with PCP regarding cholesterol and blood pressure management  Maintain strict control of hypertension with blood pressure goal below 130/90 and cholesterol with LDL cholesterol (bad cholesterol) goal below 70 mg/dL.       Followup in the future with me in 3 months or call earlier if needed       Thank you for coming to see [place] at [facility]. I hope we have been able to provide you high quality care today.  You may receive a patient satisfaction survey over the next few weeks. We would appreciate your feedback and comments so that we may continue to improve ourselves and the health of our patients.

## 2020-11-09 ENCOUNTER — Other Ambulatory Visit: Payer: Self-pay | Admitting: Internal Medicine

## 2020-11-09 ENCOUNTER — Telehealth: Payer: Self-pay | Admitting: Adult Health

## 2020-11-09 NOTE — Telephone Encounter (Signed)
Called son, pt asst foundation option,  Gave him website to download.  Check with cardiology for samples. He will let us know.  If still too costly will let us know, may have to change.

## 2020-11-09 NOTE — Telephone Encounter (Signed)
Pt's son, Lorn Junes called, refill for apixaban (ELIQUIS) 2.5 MG TABS tablet was over $500 at [facility]. Do you have a coupon or saving card to help with the cost.  She only has enough medication for tomorrow. Would like a call from the nurse.

## 2020-11-09 NOTE — Progress Notes (Signed)
I agree with the above plan 

## 2020-11-10 ENCOUNTER — Ambulatory Visit: Payer: Medicare Other | Admitting: Physical Therapy

## 2020-11-10 ENCOUNTER — Encounter: Payer: Self-pay | Admitting: Physical Therapy

## 2020-11-10 ENCOUNTER — Encounter: Payer: Self-pay | Admitting: Speech Pathology

## 2020-11-10 ENCOUNTER — Other Ambulatory Visit: Payer: Self-pay

## 2020-11-10 ENCOUNTER — Ambulatory Visit: Payer: Medicare Other | Admitting: Speech Pathology

## 2020-11-10 DIAGNOSIS — R41841 Cognitive communication deficit: Secondary | ICD-10-CM | POA: Diagnosis not present

## 2020-11-10 DIAGNOSIS — I6389 Other cerebral infarction: Secondary | ICD-10-CM

## 2020-11-10 DIAGNOSIS — R262 Difficulty in walking, not elsewhere classified: Secondary | ICD-10-CM

## 2020-11-10 DIAGNOSIS — M6281 Muscle weakness (generalized): Secondary | ICD-10-CM | POA: Diagnosis not present

## 2020-11-10 NOTE — Therapy (Signed)
[place]. [place], [place], [address] Phone: [phone]   Fax:  [phone]  Physical Therapy Treatment  Patient Details  Name: Leslie [name] MRN: [id number] Date of Birth: [birth year] Referring Provider (PT): Delice Lesch   Encounter Date: 11/10/2020   PT End of Session - 11/10/20 1004    Visit Number 3    Authorization Type Medicare    PT Start Time 0926    PT Stop Time 1005    PT Time Calculation (min) 39 min    Activity Tolerance Patient tolerated treatment well    Behavior During Therapy [facility] for tasks assessed/performed           Past Medical History:  Diagnosis Date  . Anxiety disorder   . Glaucoma   . Hearing loss   . HTN (hypertension)   . Hyperglycemia   . Hyperlipidemia   . Vitamin D deficiency     Past Surgical History:  Procedure Laterality Date  . GLAUCOMA SURGERY  2013  . TONSILLECTOMY      There were no vitals filed for this visit.   Subjective Assessment - 11/10/20 1001    Subjective Doing great, spoke with her and son, they had questions    Currently in Pain? No/denies              [facility] PT Assessment - 11/10/20 0001      Strength   Left Hip Flexion 4/5    Left Hip Extension 4/5    Left Ankle Dorsiflexion 4/5    Left Ankle Plantar Flexion 4/5    Left Ankle Inversion 4/5      Berg Balance Test   Sit to Stand Able to stand without using hands and stabilize independently    Standing Unsupported Able to stand safely 2 minutes    Sitting with Back Unsupported but Feet Supported on Floor or Stool Able to sit safely and securely 2 minutes    Stand to Sit Sits safely with minimal use of hands    Transfers Able to transfer safely, minor use of hands    Standing Unsupported with Eyes Closed Able to stand 10 seconds safely    Standing Unsupported with Feet Together Able to place feet together independently and stand 1 minute safely    From Standing, Reach Forward with  Outstretched Arm Can reach confidently >25 cm (10")    From Standing Position, Pick up Object from Floor Able to pick up shoe safely and easily    From Standing Position, Turn to Look Behind Over each Shoulder Looks behind one side only/other side shows less weight shift    Turn 360 Degrees Able to turn 360 degrees safely one side only in 4 seconds or less    Standing Unsupported, Alternately Place Feet on Step/Stool Able to stand independently and complete 8 steps >20 seconds    Standing Unsupported, One Foot in Front Able to take small step independently and hold 30 seconds    Standing on One Leg Able to lift leg independently and hold equal to or more than 3 seconds    Total Score 49                         [facility] Adult PT Treatment/Exercise - 11/10/20 0001      Ambulation/Gait   Gait Comments gait outside around the back building negotiating curbs, then on grass, slopes, uneven terrain and educated on safety  PT Education - 11/10/20 1002    Education Details gave HEP for balance and activties when she cannot get outside and do her walk, went over safety inside and outside the home, reviewed fall risks and safety.  They had questions about driving, I gave ways to safely have her practice in a parking lot after hours of a school, church, have someone with her, they report no one told her not to drive and she has an active license.  Encouraged her to stay active and do her walking    Person(s) Educated Patient;Child(ren)    Methods Explanation;Demonstration;Verbal cues;Handout    Comprehension Verbalized understanding;Returned demonstration            PT Short Term Goals - 11/10/20 1008      PT SHORT TERM GOAL #1   Title independent with HEP    Status Achieved             PT Long Term Goals - 11/10/20 1008      PT LONG TERM GOAL #1   Title increase Berg balance test score tpo 49/56    Status Achieved      PT LONG TERM GOAL #2   Title  understand safety and fall risks at home    Status Achieved      PT LONG TERM GOAL #3   Title resume her 30 minute walks at home    Status Achieved      PT LONG TERM GOAL #4   Title increase left hip strength to 4/5    Status Achieved                 Plan - 11/10/20 1004    Clinical Impression Statement Patient and her son report that she is doing very well, they report no stumbles and that they feel like she is actually doing better than prior to the CVA.  We went over all safety and how to continue to be safe.    PT Next Visit Plan d/c all goals met    Consulted and Agree with Plan of Care Patient;Family member/caregiver           Patient will benefit from skilled therapeutic intervention in order to improve the following deficits and impairments:  Difficulty walking,Decreased balance,Decreased strength  Visit Diagnosis: Cerebrovascular accident (CVA) due to other mechanism ([place])  Muscle weakness (generalized)  Difficulty in walking, not elsewhere classified     Problem List Patient Active Problem List   Diagnosis Date Noted  . Aortic atherosclerosis ([place]) 10/11/2020  . Pulmonary emphysema ([place]) 10/11/2020  . Acute blood loss anemia   . Slow transit constipation   . Hypoalbuminemia due to protein-calorie malnutrition ([place])   . Chronic combined systolic and diastolic CHF (congestive heart failure) ([place])   . Benign essential HTN   . Chronic low back pain without sciatica   . Embolic stroke ([place]) 17/79/3903  . CVA (cerebral vascular accident) ([place]) 10/01/2020  . DNR no code (do not resuscitate) 10/01/2020  . Prediabetes 10/01/2020  . Thrombocytosis 10/01/2020  . BMI 22.0-22.9, adult 05/15/2020  . CKD (chronic kidney disease) stage 3, GFR 30-59 ml/min (HCC) 05/15/2020  . Labile hypertension 06/16/2018  . FHx: heart disease 06/16/2018  . Former smoker 06/16/2018  . Abnormal glucose 06/16/2018  . Anxiety 03/04/2018  . Medication management 04/08/2014  .  Vitamin D deficiency 07/01/2013  . Hyperlipidemia, mixed 07/01/2013  . LBBB (left bundle branch block) 02/20/2010    Leslie Haws W., PT 11/10/2020, 10:08 AM  [place]. [place], [place], [address] Phone: [phone]   Fax:  [phone]  Name: Leslie [name] MRN: [id number] Date of Birth: 1927/01/21

## 2020-11-10 NOTE — Therapy (Signed)
[facility] [address]. [place], [place], [address] Phone: [phone]   Fax:  [phone]  Speech Language Pathology Treatment  Patient Details  Name: Leslie [name] MRN: [id number] Date of Birth: July 11, 1927 Referring Provider (SLP): Marcello Fennel, MD   Encounter Date: 11/10/2020   End of Session - 11/10/20 1053    Visit Number 4    Number of Visits 8    Date for SLP Re-Evaluation 12/18/20    SLP Start Time 1015    SLP Stop Time  1100    SLP Time Calculation (min) 45 min    Activity Tolerance Patient tolerated treatment well           Past Medical History:  Diagnosis Date  . Anxiety disorder   . Glaucoma   . Hearing loss   . HTN (hypertension)   . Hyperglycemia   . Hyperlipidemia   . Vitamin D deficiency     Past Surgical History:  Procedure Laterality Date  . GLAUCOMA SURGERY  2013  . TONSILLECTOMY      There were no vitals filed for this visit.          ADULT SLP TREATMENT - 11/10/20 1030      General Information   Behavior/Cognition Alert;Cooperative;Pleasant mood      Treatment Provided   Treatment provided Cognitive-Linquistic      Cognitive-Linquistic Treatment   Treatment focused on Cognition    Skilled Treatment Pt completed MOCA and scored 19/30. Deficits apparent in visuospatial/executive functioning, memory, and orientation to time (date and day). SLP had patient attempt to correct trail making and clock to see if pt was able to grasp concept. Pt was able to change the time on the clock to reflect the correct time with minA and she was not able to complete the trail making without maxA. Pt voices concerns about driving. Will reach out to provider with results.      Assessment / Recommendations / Plan   Plan Continue with current plan of care      Progression Toward Goals   Progression toward goals Progressing toward goals              SLP Short Term Goals - 11/10/20 1105       SLP SHORT TERM GOAL #1   Title Pt will recall 3 external memory strategies to assist with recalling information at home.    Time 3    Period Weeks    Status On-going      SLP SHORT TERM GOAL #2   Title Pt will recall 3 strategies for to increase safety at home.    Time 3    Period Weeks    Status On-going      SLP SHORT TERM GOAL #3   Title Pt will independently organize medications into pill box.    Time 3    Period Weeks    Status On-going      SLP SHORT TERM GOAL #4   Title Further standardized assessment of cognitive skills    Time 2    Period Weeks    Status Achieved   last session           SLP Long Term Goals - 11/10/20 1105      SLP LONG TERM GOAL #1   Title Patient will demonstrate use of memory strategies to schedule activities, recall weekly events and items to maintain safety to participate socially in functional living environment  Time 5    Period Weeks    Status On-going      SLP LONG TERM GOAL #2   Title Patient will demonstrate use of organizational skills/strategies during daily living activities to improve safety and awareness in functional living environment    Time 5    Period Weeks    Status On-going            Plan - 11/10/20 1058    Clinical Impression Statement Pt was reassessed this date using MOCA screen. Pt scored a 19/30 indicating continued deficits in visuospatial/executive functioning skills and memory. Pt was insistent that she is ready to drive. SLP told pt and son that she would report the score to the doctor and provide them with an update. Son feels like she is functioning well at home. SLP provided edu on the differences between routine work (ie. housework) vs. work that has constant unexpected changes (i.e. driving) that require cognitive flexibility.Son and pt verbalized understanding and will continue speech therapy services for the next 4 weeks.    Speech Therapy Frequency 1x /week    Treatment/Interventions Cueing  hierarchy;Functional tasks;Patient/family education;Environmental controls;Cognitive reorganization;Multimodal communcation approach;Compensatory techniques;SLP instruction and feedback;Internal/external aids;Language facilitation    Potential to Achieve Goals Good    Consulted and Agree with Plan of Care Patient;Family member/caregiver    Family Member Consulted Son           Patient will benefit from skilled therapeutic intervention in order to improve the following deficits and impairments:   Cognitive communication deficit    Problem List Patient Active Problem List   Diagnosis Date Noted  . Aortic atherosclerosis (HCC) 10/11/2020  . Pulmonary emphysema (HCC) 10/11/2020  . Acute blood loss anemia   . Slow transit constipation   . Hypoalbuminemia due to protein-calorie malnutrition (HCC)   . Chronic combined systolic and diastolic CHF (congestive heart failure) (HCC)   . Benign essential HTN   . Chronic low back pain without sciatica   . Embolic stroke (HCC) 10/03/2020  . CVA (cerebral vascular accident) (HCC) 10/01/2020  . DNR no code (do not resuscitate) 10/01/2020  . Prediabetes 10/01/2020  . Thrombocytosis 10/01/2020  . BMI 22.0-22.9, adult 05/15/2020  . CKD (chronic kidney disease) stage 3, GFR 30-59 ml/min (HCC) 05/15/2020  . Labile hypertension 06/16/2018  . FHx: heart disease 06/16/2018  . Former smoker 06/16/2018  . Abnormal glucose 06/16/2018  . Anxiety 03/04/2018  . Medication management 04/08/2014  . Vitamin D deficiency 07/01/2013  . Hyperlipidemia, mixed 07/01/2013  . LBBB (left bundle branch block) 02/20/2010    Michelene Gardener [place] MS, [place], CBIS  11/10/2020, 11:10 AM  [facility] [address]. [place], [place], [address] Phone: [phone]   Fax:  [phone]   Name: Leslie [name] MRN: [id number] Date of Birth: 1926/08/12

## 2020-11-13 DIAGNOSIS — H401431 Capsular glaucoma with pseudoexfoliation of lens, bilateral, mild stage: Secondary | ICD-10-CM | POA: Diagnosis not present

## 2020-11-13 NOTE — Telephone Encounter (Signed)
I called son,  They did get the medication at [facility], and was $500 for her prescription.  Would not qualify for PAP.  He stated did have copay savings card that they did use when left hospital and was able to get for $5 (? Medicare/medicaid pts can use??).  I relayed there are other options she can xarelto, or warfarin per cardiology if still unaffordable.  Will see Dr. Allena Katz soon (PMR) and will discuss).

## 2020-11-13 NOTE — Telephone Encounter (Signed)
He could also call her insurance company to see if certain pharmacies or mail order pharmacies could be a cheaper option as well as seeing if she is in the " donut hole" or if $500/month will be her payment ongoing.  If remains unaffordable, we will have to speak further with cardiology for other treatment options such as Xarelto or warfarin but if able to continue on Eliquis, this would be preferred

## 2020-11-16 ENCOUNTER — Encounter: Payer: Self-pay | Admitting: Registered Nurse

## 2020-11-16 ENCOUNTER — Other Ambulatory Visit: Payer: Self-pay

## 2020-11-16 ENCOUNTER — Encounter: Payer: Medicare Other | Attending: Physical Medicine & Rehabilitation | Admitting: Registered Nurse

## 2020-11-16 ENCOUNTER — Inpatient Hospital Stay: Payer: PRIVATE HEALTH INSURANCE | Admitting: Physical Medicine & Rehabilitation

## 2020-11-16 VITALS — BP 156/94 | HR 83 | Temp 98.2°F | Ht 62.0 in | Wt 108.4 lb

## 2020-11-16 DIAGNOSIS — I10 Essential (primary) hypertension: Secondary | ICD-10-CM | POA: Diagnosis not present

## 2020-11-16 DIAGNOSIS — I639 Cerebral infarction, unspecified: Secondary | ICD-10-CM | POA: Diagnosis not present

## 2020-11-16 NOTE — Progress Notes (Signed)
Subjective:    Patient ID: Leslie [name], female    DOB: August 01, 1926, 87 y.o.   MRN: [id number]  HPI: Leslie [name] is a 87 y.o. female who is here for HFU appointment for follow up of her Embolic Stroke and Essential Hypertension. She presented to [facility] on 10/01/20 with slurred speech and facial droop. Neurology Consulted.  CT Head WO Contrast:  IMPRESSION: 1. No acute finding by CT. Atrophy and chronic small-vessel ischemic Changes.   CTA Head: Neck IMPRESSION: 1. Aortic atherosclerosis and ectasia. 2. Mild atherosclerotic change at both carotid bifurcations but without stenosis. 3. No intracranial large or medium vessel occlusion or correctable proximal stenosis. 4. Apparent occlusion of the innominate vein, possibly due to extrinsic compression between the arterial structures and right clavicular head. This is responsible for the pattern of venous collateral drainage described in the body of the report. 5. Previous left thyroidectomy. Multiple nodules within the right lobe of the thyroid, the largest measuring 1.4 cm in diameter. Recommend thyroid US (ref: J Am Coll Radiol. 2015 Feb;12(2): 143-50).  Aortic Atherosclerosis (ICD10-I70.0) and Emphysema (ICD10-J43.9).  MR Brain:  IMPRESSION: 1. Several small/punctate acute infarctions at the right frontoparietal vertex. 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 This distribution would suggest embolic disease from the heart or ascending aorta. 2. Numerous old small vessel ischemic changes throughout the brain as outlined above.  Leslie [name] was admitted to inpatient rehabilitation on 10/03/2020 and discharged home on 10/10/2020. She is receiving outpatient therapy at [facility] therapy. She denies any pain. She rates her pain 0. Also reports she has good appetite.   Son in room.   Pain Inventory Average Pain 0 Pain Right Now 0 My pain is no piain  LOCATION OF PAIN  No  pain  BOWEL Number of stools per week: not sure Oral laxative use Yes  Type of laxative miralax as needed Enema or suppository use No  History of colostomy No  Incontinent No   BLADDER Normal In and out cath, frequency n/a Able to self cath Yes  Bladder incontinence No  Frequent urination No  Leakage with coughing No  Difficulty starting stream No  Incomplete bladder emptying No    Mobility walk without assistance ability to climb steps?  yes transfers alone Do you have any goals in this area?  yes  Function retired I need assistance with the following:  no assistance Do you have any goals in this area?  no  Neuro/Psych No problems in this area  Prior Studies no changes  Physicians involved in your care n/a   Family History  Problem Relation Age of Onset  . Heart attack Brother   . Heart disease Brother   . Heart attack Brother   . Heart disease Brother   . Hypertension Mother   . Stroke Mother   . Heart attack Father   . Diabetes Father    Social History   Socioeconomic History  . Marital status: Married    Spouse name: Not on file  . Number of children: Not on file  . Years of education: Not on file  . Highest education level: Not on file  Occupational History  . Not on file  Tobacco Use  . Smoking status: Former Smoker    Quit date: 07/16/1979    Years since quitting: 41.3  . Smokeless tobacco: Never Used  Vaping Use  . Vaping Use: Never used  Substance and Sexual Activity  . Alcohol use:  No    Alcohol/week: 0.0 standard drinks  . Drug use: No  . Sexual activity: Not Currently  Other Topics Concern  . Not on file  Social History Narrative  . Not on file   Social Determinants of Health   Financial Resource Strain: Not on file  Food Insecurity: Not on file  Transportation Needs: Not on file  Physical Activity: Not on file  Stress: Not on file  Social Connections: Not on file   Past Surgical History:  Procedure Laterality Date   . GLAUCOMA SURGERY  2013  . TONSILLECTOMY     Past Medical History:  Diagnosis Date  . Anxiety disorder   . Glaucoma   . Hearing loss   . HTN (hypertension)   . Hyperglycemia   . Hyperlipidemia   . Vitamin D deficiency    BP (!) 156/94   Pulse 83   Temp 98.2 F (36.8 C)   Ht 5\' 2"  (1.575 m)   Wt 108 lb 6.4 oz (49.2 kg)   SpO2 98%   BMI 19.83 kg/m   Opioid Risk Score:   Fall Risk Score:  `1  Depression screen PHQ 2/9  Depression screen [facility] 2/9 11/08/2020 08/29/2020 05/17/2020 02/01/2020 07/10/2019 07/10/2019 04/06/2019  Decreased Interest 0 0 0 0 0 0 0  Down, Depressed, Hopeless 0 0 0 0 0 0 0  PHQ - 2 Score 0 0 0 0 0 0 0      Review of Systems  Constitutional: Negative.   HENT: Negative.   Eyes: Negative.   Respiratory: Negative.   Cardiovascular: Negative.   Gastrointestinal: Negative.   Endocrine: Negative.   Genitourinary: Negative.  Negative for difficulty urinating.  Musculoskeletal: Negative.   Skin: Negative.   Allergic/Immunologic: Negative.   Neurological: Negative.   Hematological: Negative.   Psychiatric/Behavioral: Negative.        Objective:   Physical Exam Vitals and nursing note reviewed.  Constitutional:      Appearance: Normal appearance.  Cardiovascular:     Rate and Rhythm: Normal rate and regular rhythm.     Pulses: Normal pulses.     Heart sounds: Normal heart sounds.  Pulmonary:     Effort: Pulmonary effort is normal.     Breath sounds: Normal breath sounds.  Musculoskeletal:     Cervical back: Normal range of motion and neck supple.     Comments: Normal Muscle Bulk and Muscle Testing Reveals:  Upper Extremities: Full ROM and Muscle Strength 5/5  Lower Extremities: Full ROM ands Muscle Strength 5/5 Arises from Table with ease Narrow Based  Gait   Skin:    General: Skin is warm and dry.  Neurological:     Mental Status: She is alert and oriented to person, place, and time.  Psychiatric:        Mood and Affect: Mood normal.         Behavior: Behavior normal.           Assessment & Plan:  1. Embolic Stroke: Continue Outpatient Therapy with [facility]. Continue current medication regimen. She has a Neurology appointment with [facility] Neurology.  2. Essential Hypertension: Continue current medication Regimen. PCP Following. Continue to Monitor.   F/U with [facility] in 4- 6 weeks

## 2020-11-17 ENCOUNTER — Ambulatory Visit: Payer: Medicare Other | Admitting: Physical Therapy

## 2020-11-17 ENCOUNTER — Ambulatory Visit: Payer: Medicare Other | Attending: Physical Medicine & Rehabilitation | Admitting: Speech Pathology

## 2020-11-17 ENCOUNTER — Encounter: Payer: Self-pay | Admitting: Speech Pathology

## 2020-11-17 DIAGNOSIS — R41841 Cognitive communication deficit: Secondary | ICD-10-CM | POA: Insufficient documentation

## 2020-11-17 NOTE — Therapy (Signed)
[facility] [address] [place], [place], [address] Phone: [phone]   Fax:  [phone]  Speech Language Pathology Treatment  Patient Details  Name: Leslie [name] MRN: [id number] Date of Birth: Dec 26, 1926 Referring Provider (SLP): Marcello Fennel, MD   Encounter Date: 11/17/2020   End of Session - 11/17/20 1018    Visit Number 5    Number of Visits 8    Date for SLP Re-Evaluation 12/18/20    SLP Start Time 1015    SLP Stop Time  1058    SLP Time Calculation (min) 43 min    Activity Tolerance Patient tolerated treatment well           Past Medical History:  Diagnosis Date  . Anxiety disorder   . Glaucoma   . Hearing loss   . HTN (hypertension)   . Hyperglycemia   . Hyperlipidemia   . Vitamin D deficiency     Past Surgical History:  Procedure Laterality Date  . GLAUCOMA SURGERY  2013  . TONSILLECTOMY      There were no vitals filed for this visit.   Subjective Assessment - 11/17/20 1017    Subjective Pt reports she is doing well. She reported she did yard work yesterday.    Currently in Pain? No/denies                 ADULT SLP TREATMENT - 11/17/20 1059      General Information   Behavior/Cognition Alert;Cooperative;Pleasant mood      Treatment Provided   Treatment provided Cognitive-Linquistic      Cognitive-Linquistic Treatment   Treatment focused on Cognition    Skilled Treatment Trained patient in attention skills. Difficulty with alternating attention - trail making 9 min 30 sec (1A2B)      Assessment / Recommendations / Plan   Plan Continue with current plan of care      Progression Toward Goals   Progression toward goals Progressing toward goals              SLP Short Term Goals - 11/17/20 1057      SLP SHORT TERM GOAL #1   Title Pt will recall 3 external memory strategies to assist with recalling information at home.    Time 2    Period Weeks    Status On-going       SLP SHORT TERM GOAL #2   Title Pt will recall 3 strategies for to increase safety at home.    Time 2    Period Weeks    Status On-going      SLP SHORT TERM GOAL #3   Title Pt will independently organize medications into pill box.    Time 2    Period Weeks    Status On-going      SLP SHORT TERM GOAL #4   Title Further standardized assessment of cognitive skills    Time 2    Period Weeks    Status Achieved   last session           SLP Long Term Goals - 11/17/20 1058      SLP LONG TERM GOAL #1   Title Patient will demonstrate use of memory strategies to schedule activities, recall weekly events and items to maintain safety to participate socially in functional living environment    Time 4    Period Weeks    Status On-going      SLP LONG TERM GOAL #2  Title Patient will demonstrate use of organizational skills/strategies during daily living activities to improve safety and awareness in functional living environment    Time 4    Period Weeks    Status On-going            Plan - 11/17/20 1019    Clinical Impression Statement Pt was reassessed this date using MOCA screen. Pt scored a 19/30 indicating continued deficits in visuospatial/executive functioning skills and memory. Pt was insistent that she is ready to drive. SLP told pt and son that she would report the score to the doctor and provide them with an update. Son feels like she is functioning well at home. SLP provided edu on the differences between routine work (ie. housework) vs. work that has constant unexpected changes (i.e. driving) that require cognitive flexibility.Son and pt verbalized understanding and will continue speech therapy services for the next 4 weeks.    Speech Therapy Frequency 1x /week    Treatment/Interventions Cueing hierarchy;Functional tasks;Patient/family education;Environmental controls;Cognitive reorganization;Multimodal communcation approach;Compensatory techniques;SLP instruction and  feedback;Internal/external aids;Language facilitation    Potential to Achieve Goals Good    Consulted and Agree with Plan of Care Patient;Family member/caregiver    Family Member Consulted Son           Patient will benefit from skilled therapeutic intervention in order to improve the following deficits and impairments:   Cognitive communication deficit    Problem List Patient Active Problem List   Diagnosis Date Noted  . Aortic atherosclerosis (HCC) 10/11/2020  . Pulmonary emphysema (HCC) 10/11/2020  . Acute blood loss anemia   . Slow transit constipation   . Hypoalbuminemia due to protein-calorie malnutrition (HCC)   . Chronic combined systolic and diastolic CHF (congestive heart failure) (HCC)   . Benign essential HTN   . Chronic low back pain without sciatica   . Embolic stroke (HCC) 10/03/2020  . CVA (cerebral vascular accident) (HCC) 10/01/2020  . DNR no code (do not resuscitate) 10/01/2020  . Prediabetes 10/01/2020  . Thrombocytosis 10/01/2020  . BMI 22.0-22.9, adult 05/15/2020  . CKD (chronic kidney disease) stage 3, GFR 30-59 ml/min (HCC) 05/15/2020  . Labile hypertension 06/16/2018  . FHx: heart disease 06/16/2018  . Former smoker 06/16/2018  . Abnormal glucose 06/16/2018  . Anxiety 03/04/2018  . Medication management 04/08/2014  . Vitamin D deficiency 07/01/2013  . Hyperlipidemia, mixed 07/01/2013  . LBBB (left bundle branch block) 02/20/2010    Michelene Gardener Mendon MS, [place], CBIS  11/17/2020, 11:00 AM  [facility] [address] [place], [place], [address] Phone: [phone]   Fax:  [phone]   Name: Leslie [name] MRN: [id number] Date of Birth: 04-08-1927

## 2020-11-24 ENCOUNTER — Ambulatory Visit: Payer: Medicare Other | Admitting: Physical Therapy

## 2020-11-24 ENCOUNTER — Ambulatory Visit: Payer: Medicare Other | Admitting: Speech Pathology

## 2020-11-24 ENCOUNTER — Other Ambulatory Visit: Payer: Self-pay

## 2020-11-24 ENCOUNTER — Encounter: Payer: Self-pay | Admitting: Speech Pathology

## 2020-11-24 DIAGNOSIS — R41841 Cognitive communication deficit: Secondary | ICD-10-CM

## 2020-11-24 NOTE — Therapy (Addendum)
[facility]. [place], [place], [address] Phone: [phone]   Fax:  [phone]  Speech Language Pathology Treatment & Discharge Summary  Patient Details  Name: Leslie [name] MRN: [id number] Date of Birth: [birth year] Referring Provider (SLP): Jamse Arn, MD   Encounter Date: 11/24/2020   End of Session - 11/24/20 1020    Visit Number 6    Number of Visits 8    Date for SLP Re-Evaluation 12/18/20    SLP Start Time 75    SLP Stop Time  1058    SLP Time Calculation (min) 41 min    Activity Tolerance Patient tolerated treatment well           Past Medical History:  Diagnosis Date  . Anxiety disorder   . Glaucoma   . Hearing loss   . HTN (hypertension)   . Hyperglycemia   . Hyperlipidemia   . Vitamin D deficiency     Past Surgical History:  Procedure Laterality Date  . GLAUCOMA SURGERY  2013  . TONSILLECTOMY      There were no vitals filed for this visit.              SLP Short Term Goals - 11/24/20 1055      SLP SHORT TERM GOAL #1   Title Pt will recall 3 external memory strategies to assist with recalling information at home.    Time 2    Period Weeks    Status Achieved      SLP SHORT TERM GOAL #2   Title Pt will recall 3 strategies for to increase safety at home.    Time 2    Period Weeks    Status Achieved      SLP SHORT TERM GOAL #3   Title Pt will independently organize medications into pill box.    Time 2    Period Weeks    Status Achieved      SLP SHORT TERM GOAL #4   Title Further standardized assessment of cognitive skills    Time 2    Period Weeks    Status Achieved   last session           SLP Long Term Goals - 11/24/20 1055      SLP LONG TERM GOAL #1   Title Patient will demonstrate use of memory strategies to schedule activities, recall weekly events and items to maintain safety to participate socially in functional living environment    Time 4     Period Weeks    Status Achieved      SLP LONG TERM GOAL #2   Title Patient will demonstrate use of organizational skills/strategies during daily living activities to improve safety and awareness in functional living environment    Time 4    Period Weeks    Status Achieved            Plan - 11/24/20 1022    Clinical Impression Statement Pt to be discharged from therapy. Pt was able to complete medication management independently. Son and patient verbalize no difficulties at home. Believe she is back and better than she was before the stroke. SLP provided pt with driving safety handouts and referred to doctor for questions about clearance.    Speech Therapy Frequency 1x /week    Treatment/Interventions Cueing hierarchy;Functional tasks;Patient/family education;Environmental controls;Cognitive reorganization;Multimodal communcation approach;Compensatory techniques;SLP instruction and feedback;Internal/external aids;Language facilitation    Potential to Achieve Goals Good  Consulted and Agree with Plan of Care Patient;Family member/caregiver    Family Member Consulted Son           Patient will benefit from skilled therapeutic intervention in order to improve the following deficits and impairments:   Cognitive communication deficit    Problem List Patient Active Problem List   Diagnosis Date Noted  . Aortic atherosclerosis ([place]) 10/11/2020  . Pulmonary emphysema ([place]) 10/11/2020  . Acute blood loss anemia   . Slow transit constipation   . Hypoalbuminemia due to protein-calorie malnutrition ([place])   . Chronic combined systolic and diastolic CHF (congestive heart failure) ([place])   . Benign essential HTN   . Chronic low back pain without sciatica   . Embolic stroke ([place]) 53/66/4403  . CVA (cerebral vascular accident) ([place]) 10/01/2020  . DNR no code (do not resuscitate) 10/01/2020  . Prediabetes 10/01/2020  . Thrombocytosis 10/01/2020  . BMI 22.0-22.9, adult 05/15/2020  .  CKD (chronic kidney disease) stage 3, GFR 30-59 ml/min (HCC) 05/15/2020  . Labile hypertension 06/16/2018  . FHx: heart disease 06/16/2018  . Former smoker 06/16/2018  . Abnormal glucose 06/16/2018  . Anxiety 03/04/2018  . Medication management 04/08/2014  . Vitamin D deficiency 07/01/2013  . Hyperlipidemia, mixed 07/01/2013  . LBBB (left bundle branch block) 02/20/2010   SPEECH THERAPY DISCHARGE SUMMARY  Visits from Start of Care: 6  Current functional level related to goals / functional outcomes: WFL   Remaining deficits: WFL; higher level cognitive deficits. Able to complete daily activities with intermittent supervision.   Education / Equipment: Armed forces training and education officer Plan: Patient agrees to discharge.  Patient goals were met. Patient is being discharged due to meeting the stated rehab goals.  ?????      [place] MS, [place], CBIS   11/24/2020, 10:56 AM  [place]. [place], [place], [address] Phone: [phone]   Fax:  [phone]   Name: KORRYN PANCOAST MRN: [id number] Date of Birth: 18-Oct-1926

## 2020-11-27 ENCOUNTER — Encounter: Payer: Self-pay | Admitting: Physician Assistant

## 2020-11-28 NOTE — Progress Notes (Signed)
Cardiology Office Note:    Date:  11/29/2020   ID:  [name], DOB [birth year], MRN [id number]  PCP:  Unk Pinto, MD Referring MD: Unk Pinto, MD    [place]  Cardiologist:  Candee Furbish, MD  Reason for visit: Hospital f/u  History of Present Illness:    Leslie [name] is a 87 y.o. female with a hx of HTN & hyperlipidemia.   She was admitted on 10/01/20 with slurred speech and facial droop. Work up revealed several small punctate infarcts in right frontoparietal vertex and left cerebellum as well as severely depressed LVEF with EF 25%. Dr. Erlinda Hong felt that stroke was embolic in setting of low EF and DOAC recommended until EF improved to >35%. Dr. Etter Sjogren was consulted for input and low dose BB added due to episode of asymptomatic NSVT. Therapy evaluation completed revealing balance deficits and she was discharged to rehab.  Today, the patient is accompanied by her son that lives in [place].  The patient has done remarkably well post CVA.  She has been discharged from all therapy services and is back to walking 5x/week for 30 minutes around her 2 acre property, living independently and cleaning/cooking for herself.  Her son mentions she has minor residual short term memory loss.  She is hoping to get back to driving soon.    She denies chest pain, shortness of breath, syncope, orthopnea, PND or significant pedal edema.  No bleeding issues with Eliquis.  She is tolerating her medications well without side effects - no muscle aches, dizziness/lightheadedness.    Past Medical History:  Diagnosis Date  . Anxiety disorder   . Cardiomyopathy ([place])   . Chronic systolic CHF (congestive heart failure) ([place])   . Glaucoma   . Hearing loss   . HTN (hypertension)   . Hyperglycemia   . Hyperlipidemia   . LBBB (left bundle branch block)   . Stroke (cerebrum) ([place])   . Vitamin D deficiency     Past Surgical History:  Procedure Laterality Date  . GLAUCOMA  SURGERY  2013  . TONSILLECTOMY      Current Medications: Current Meds  Medication Sig  . apixaban (ELIQUIS) 2.5 MG TABS tablet Take 1 tablet (2.5 mg total) by mouth 2 (two) times daily.  [place] Kitchen atorvastatin (LIPITOR) 20 MG tablet Take 1 tablet (20 mg total) by mouth daily.  [place] Kitchen latanoprost (XALATAN) 0.005 % ophthalmic solution Place 1 drop into both eyes daily.  [place] Kitchen LORazepam (ATIVAN) 0.5 MG tablet Take  1/2 to 1 tablet  at Bedtime  ONLY  if needed for Sleep & please try to limit to 5 days /week to avoid Addiction & Dementia  . metoprolol succinate (TOPROL-XL) 25 MG 24 hr tablet Take 1 tablet (25 mg total) by mouth daily.  . polyethylene glycol (MIRALAX / GLYCOLAX) 17 g packet Take 17 g by mouth 2 (two) times daily.     Allergies:   Adhesive [tape], Advil [ibuprofen], and Atenolol   Social History   Socioeconomic History  . Marital status: Married    Spouse name: Not on file  . Number of children: Not on file  . Years of education: Not on file  . Highest education level: Not on file  Occupational History  . Not on file  Tobacco Use  . Smoking status: Former Smoker    Quit date: 07/16/1979    Years since quitting: 41.4  . Smokeless tobacco: Never Used  Vaping Use  . Vaping Use:  Never used  Substance and Sexual Activity  . Alcohol use: No    Alcohol/week: 0.0 standard drinks  . Drug use: No  . Sexual activity: Not Currently  Other Topics Concern  . Not on file  Social History Narrative  . Not on file   Social Determinants of Health   Financial Resource Strain: Not on file  Food Insecurity: Not on file  Transportation Needs: Not on file  Physical Activity: Not on file  Stress: Not on file  Social Connections: Not on file     Family History: The patient's family history includes Diabetes in her father; Heart attack in her brother, brother, and father; Heart disease in her brother and brother; Hypertension in her mother; Stroke in her mother.  ROS:   Please see the history  of present illness.     EKGs/Labs/Other Studies Reviewed:    EKG:  Not done today.  Recent Labs: 10/02/2020: Magnesium 1.9; TSH 2.076 10/12/2020: ALT 16; BUN 23; Creat 0.72; Hemoglobin 12.5; Platelets 489; Potassium 4.7; Sodium 138  Recent Lipid Panel    Component Value Date/Time   CHOL 135 10/01/2020 1801   TRIG 51 10/01/2020 1801   HDL 47 10/01/2020 1801   CHOLHDL 2.9 10/01/2020 1801   VLDL 10 10/01/2020 1801   LDLCALC 78 10/01/2020 1801   LDLCALC 100 (H) 05/17/2020 1229    Physical Exam:    VS:  BP 128/86   Pulse 92   Ht $R'5\' 2"'iO$  (1.575 m)   Wt 107 lb (48.5 kg)   SpO2 98% Comment: at rest  BMI 19.57 kg/m     Wt Readings from Last 3 Encounters:  11/29/20 107 lb (48.5 kg)  11/16/20 108 lb 6.4 oz (49.2 kg)  11/08/20 110 lb (49.9 kg)     GEN: Well nourished, well developed in no acute distress HEENT: Normal NECK: No JVD; No carotid bruits CARDIAC: RRR, no murmurs, rubs, gallops RESPIRATORY:  Clear to auscultation without rales, wheezing or rhonchi  ABDOMEN: Soft, non-tender, non-distended MUSCULOSKELETAL: No edema; No deformity  SKIN: Warm and dry NEUROLOGIC:  Alert and oriented PSYCHIATRIC:  Normal affect   ASSESSMENT AND PLAN    1. Cardiomyopathy, unspecified type ([place]), euvolemic today - 2D echo 09/2020 with EF 25%, akinesis of the septum, inferior and apical walls.  Denies hx of CHF/CHF symptoms. --> Continue Toprol XL  --> Last K 4.7.  Check CMET today.  If K WNL, then recommend starting losartan $RemoveBeforeDEI'25mg'FGDqDGhwbriAmGCQ$  daily for CHF med optimization.  After starting losartan, I recommend checking BMET in 1 week to monitor K/Cr. --> Check 2D echo in 1 month to recheck EF.     2. Hyperlipidemia with goal LDL <70. - LDL 78 09/2020. --> Continue lipitor $RemoveBeforeDE'20mg'urePnDsLmaqgbLT$  daily. --> Recheck fasting lipids in 1 week when returns for BMET.   3. Essential hypertension, well controlled. --> Continue meds as above.  4. Cerebrovascular accident (CVA) due to embolism of cerebral artery (HCC) -  Thought embolic in the setting of depressed EF. --> Continue Eliquis x 3 months.  Can switch to antiplatelet at 3 months IF EF improved.   Medication Adjustments/Labs and Tests Ordered: Current medicines are reviewed at length with the patient today.  Concerns regarding medicines are outlined above.  Orders Placed This Encounter  Procedures  . Comp Met (CMET)  . CBC  . ECHOCARDIOGRAM COMPLETE   No orders of the defined types were placed in this encounter.   Patient Instructions  Medication Instructions:  Your physician recommends that  you continue on your current medications as directed. Please refer to the Current Medication list given to you today.  *If you need a refill on your cardiac medications before your next appointment, please call your pharmacy*   Lab Work:  TODAY!!!!  CMET/CBC  If you have labs (blood work) drawn today and your tests are completely normal, you will receive your results only by: [place] Kitchen MyChart Message (if you have MyChart) OR . A paper copy in the mail If you have any lab test that is abnormal or we need to change your treatment, we will call you to review the results.   Testing/Procedures: Your physician has requested that you have an echocardiogram. Echocardiography is a painless test that uses sound waves to create images of your heart. It provides your doctor with information about the size and shape of your heart and how well your heart's chambers and valves are working. This procedure takes approximately one hour. There are no restrictions for this procedure.     Follow-Up: At [facility], you and your health needs are our priority.  As part of our continuing mission to provide you with exceptional heart care, we have created designated Provider Care Teams.  These Care Teams include your primary Cardiologist (physician) and Advanced Practice Providers (APPs -  Physician Assistants and Nurse Practitioners) who all work together to provide you with  the care you need, when you need it.  We recommend signing up for the patient portal called "MyChart".  Sign up information is provided on this After Visit Summary.  MyChart is used to connect with patients for Virtual Visits (Telemedicine).  Patients are able to view lab/test results, encounter notes, upcoming appointments, etc.  Non-urgent messages can be sent to your provider as well.   To learn more about what you can do with MyChart, go to [email or web address].    Your next appointment:   8 week(s)  The format for your next appointment:   In Person  Provider:   Candee Furbish, MD of [facility] Presume, PA   Other Instructions None     Signed, Gaston Islam  11/29/2020 9:35 AM    [place]

## 2020-11-29 ENCOUNTER — Ambulatory Visit (INDEPENDENT_AMBULATORY_CARE_PROVIDER_SITE_OTHER): Payer: Medicare Other | Admitting: Physician Assistant

## 2020-11-29 ENCOUNTER — Other Ambulatory Visit: Payer: Self-pay

## 2020-11-29 ENCOUNTER — Encounter: Payer: Self-pay | Admitting: Physician Assistant

## 2020-11-29 VITALS — BP 128/86 | HR 92 | Ht 62.0 in | Wt 107.0 lb

## 2020-11-29 DIAGNOSIS — I6340 Cerebral infarction due to embolism of unspecified cerebral artery: Secondary | ICD-10-CM

## 2020-11-29 DIAGNOSIS — I10 Essential (primary) hypertension: Secondary | ICD-10-CM

## 2020-11-29 DIAGNOSIS — I639 Cerebral infarction, unspecified: Secondary | ICD-10-CM

## 2020-11-29 DIAGNOSIS — E782 Mixed hyperlipidemia: Secondary | ICD-10-CM | POA: Diagnosis not present

## 2020-11-29 DIAGNOSIS — I429 Cardiomyopathy, unspecified: Secondary | ICD-10-CM | POA: Diagnosis not present

## 2020-11-29 LAB — COMPREHENSIVE METABOLIC PANEL
ALT: 25 IU/L (ref 0–32)
AST: 22 IU/L (ref 0–40)
Albumin/Globulin Ratio: 1.5 (ref 1.2–2.2)
Albumin: 4.1 g/dL (ref 3.5–4.6)
Alkaline Phosphatase: 62 IU/L (ref 44–121)
BUN/Creatinine Ratio: 21 (ref 12–28)
BUN: 18 mg/dL (ref 10–36)
Bilirubin Total: 0.5 mg/dL (ref 0.0–1.2)
CO2: 24 mmol/L (ref 20–29)
Calcium: 10.4 mg/dL — ABNORMAL HIGH (ref 8.7–10.3)
Chloride: 103 mmol/L (ref 96–106)
Creatinine, Ser: 0.87 mg/dL (ref 0.57–1.00)
Globulin, Total: 2.7 g/dL (ref 1.5–4.5)
Glucose: 92 mg/dL (ref 65–99)
Potassium: 5.1 mmol/L (ref 3.5–5.2)
Sodium: 138 mmol/L (ref 134–144)
Total Protein: 6.8 g/dL (ref 6.0–8.5)
eGFR: 62 mL/min/{1.73_m2} (ref >59)

## 2020-11-29 LAB — CBC
Hematocrit: 40.6 % (ref 34.0–46.6)
Hemoglobin: 13.5 g/dL (ref 11.1–15.9)
MCH: 29.6 pg (ref 26.6–33.0)
MCHC: 33.3 g/dL (ref 31.5–35.7)
MCV: 89 fL (ref 79–97)
Platelets: 402 10*3/uL (ref 150–450)
RBC: 4.56 x10E6/uL (ref 3.77–5.28)
RDW: 15.5 % — ABNORMAL HIGH (ref 11.7–15.4)
WBC: 8.8 10*3/uL (ref 3.4–10.8)

## 2020-11-29 NOTE — Patient Instructions (Addendum)
Medication Instructions:  Your physician recommends that you continue on your current medications as directed. Please refer to the Current Medication list given to you today.  *If you need a refill on your cardiac medications before your next appointment, please call your pharmacy*   Lab Work:  TODAY!!!!  CMET/CBC  If you have labs (blood work) drawn today and your tests are completely normal, you will receive your results only by: [place] Kitchen MyChart Message (if you have MyChart) OR . A paper copy in the mail If you have any lab test that is abnormal or we need to change your treatment, we will call you to review the results.   Testing/Procedures: Your physician has requested that you have an echocardiogram. Wednesday, June 29 @ 8:35 am.  Echocardiography is a painless test that uses sound waves to create images of your heart. It provides your doctor with information about the size and shape of your heart and how well your heart's chambers and valves are working. This procedure takes approximately one hour. There are no restrictions for this procedure.     Follow-Up: At [facility], you and your health needs are our priority.  As part of our continuing mission to provide you with exceptional heart care, we have created designated Provider Care Teams.  These Care Teams include your primary Cardiologist (physician) and Advanced Practice Providers (APPs -  Physician Assistants and Nurse Practitioners) who all work together to provide you with the care you need, when you need it.  We recommend signing up for the patient portal called "MyChart".  Sign up information is provided on this After Visit Summary.  MyChart is used to connect with patients for Virtual Visits (Telemedicine).  Patients are able to view lab/test results, encounter notes, upcoming appointments, etc.  Non-urgent messages can be sent to your provider as well.   To learn more about what you can do with MyChart, go to  [email or web address].    Your next appointment:   8 week(s) on Thursday, July 20 @ 2:20 pm    The format for your next appointment:   In Person  Provider:   Donato Schultz, MD    Other Instructions None

## 2020-12-01 ENCOUNTER — Ambulatory Visit: Payer: Medicare Other | Admitting: Speech Pathology

## 2020-12-01 ENCOUNTER — Other Ambulatory Visit: Payer: Self-pay | Admitting: *Deleted

## 2020-12-01 DIAGNOSIS — E782 Mixed hyperlipidemia: Secondary | ICD-10-CM

## 2020-12-05 ENCOUNTER — Ambulatory Visit: Payer: Medicare Other | Admitting: Adult Health Nurse Practitioner

## 2020-12-08 ENCOUNTER — Ambulatory Visit: Payer: Medicare Other | Admitting: Speech Pathology

## 2020-12-15 ENCOUNTER — Other Ambulatory Visit: Payer: Self-pay | Admitting: Internal Medicine

## 2021-01-05 ENCOUNTER — Encounter: Payer: Self-pay | Admitting: Adult Health

## 2021-01-05 NOTE — Progress Notes (Signed)
3 MONTH FOLLOW UP  Assessment:    Atherosclerosis of aorta (HCC)- CT 09/2020 Control blood pressure, cholesterol, glucose, increase exercise.   Chronic combined systolic and diastolic CHF (congestive heart failure) (HCC) Appears euvolemic On BB; not on losartan due to hyperkalemia, has follow up ECHO scheduled Cardiology following Reviewed low sodium and fluid intake Call office or present to ED for any weight gain with new fatigue, dyspnea or chest pain  Labile hypertension Currently off of medications and at goal Monitor blood pressure at home; call if consistently over 140/90 Continue DASH diet.   Reminder to go to the ER if any CP, SOB, nausea, dizziness, severe HA, changes vision/speech, left arm numbness and tingling and jaw pain.  CKD IIIa (HCC) Increase fluids, avoid NSAIDS, monitor sugars, will monitor - CMP/GFR  Vitamin D deficiency At goal at recent check; continue to recommend supplementation for goal of 60-100 Defer vitamin D level  Hx of Prediabetes Discussed disease and risks Discussed diet/exercise, weight management  A1C annually; monitor CMP/weight  Medication management CBC, CMP/GFR  Hyperlipidemia Continue medications: atorvastatin 20 mg daily  Continue low cholesterol diet and exercise.  - patient non-fasting, discussed and defer lipid panel to cardiology appointment 01/10/2021 per their note  Hx of Embolic CVA Neuro following; she is on NOAC until EF improves 35%+ LDL goal <70  Anxiety Discussed goal to avoid daily benzo use, risks discussed Try melatonin 5-15 mg, low dose benadryl, or can call back and consider trazodone if above not helpful Goal at minimum to limit benzo to <5 days/week  Continue lifestyle efforts  BMI 22 Continue to recommend diet heavy in fruits and veggies and low in animal meats, cheeses, and dairy products, appropriate calorie intake Discuss exercise recommendations routinely Continue to monitor weight at each  visit   Over 30 minutes of exam, counseling, chart review and critical decision making was performed Future Appointments  Date Time Provider Department Center  01/08/2021 10:00 AM Judd Gaudier, NP [facility] None  01/10/2021  8:35 AM [facility] 1 [facility]  01/10/2021  9:40 AM Ranelle Oyster, MD [facility]  01/10/2021 10:00 AM [facility]  02/01/2021  2:20 PM Jake Bathe, MD [facility]  02/08/2021  8:45 AM Ihor Austin, NP [facility] None  04/11/2021  3:00 PM Lucky Cowboy, MD [facility] None  05/17/2021 11:00 AM Judd Gaudier, NP [facility] None    Subjective:  Leslie [name] is a 87 y.o. female who presents for 3  month follow up.   She was admitted 10/01/2020 cerebral embolic CVA (without residual deficits other than mild short term memory loss after inpatient therapy, son has noted improving), initially on DAPT transitioned to elequis 2.5 mg BID due to depressed EF -  2D echo showed severely depressed LVEF with akinesis of the septum, inferior and apical walls, hypokinesis elsewhere.  LVEF is 25%, normal RV. Plan to continue NOAC until EF 35%+. Has followed up with cardiology; losartan 25 mg was discussed but held due to potassium levels. She has follow up ECHO planned later this month.   She is accompanied by her son today, back to driving ans staying by herself, slowly transition back to independent living with close check ins from her sons. Denies residual sx from recent CVA following therapy.   She is currently prescribed ativan 0.5 mg for anxiety (reduced from previous 1 mg); she reports she takes dakly at night for sleep, taking 1/2 tab (0.25 mg). Hasn't needed during the day. 30  tabs typically last 60 days.  BMI is Body mass index is 19.57 kg/m., she has been working on diet and exercise. Walks 5 days a week, 30 min daily.  Wt Readings from Last 3 Encounters:  01/08/21 107 lb (48.5 kg)  11/29/20 107 lb (48.5  kg)  11/16/20 108 lb 6.4 oz (49.2 kg)   Her blood pressure has been controlled at home (100s-140/80s), today their BP is BP: 122/76 She does workout. She denies chest pain, shortness of breath, dizziness.   She has aortic atherosclerosis and emphysema per CT 09/2020.   She is on cholesterol medication (newly on atorvastatin 20 mg daily, cardiology wanted fasting follow up lab, she reports non-fasting today) and denies myalgias. Her cholesterol is not at goal LDL <70. The cholesterol last visit was:   Lab Results  Component Value Date   CHOL 135 10/01/2020   HDL 47 10/01/2020   LDLCALC 78 10/01/2020   TRIG 51 10/01/2020   CHOLHDL 2.9 10/01/2020    She has been working on diet and exercise for hx prediabetes (rarely eats bread, does have a sweet tooth), and denies foot ulcerations, increased appetite, nausea, paresthesia of the feet, polydipsia, polyuria, visual disturbances, vomiting and weight loss. Last A1C in the office was:  Lab Results  Component Value Date   HGBA1C 5.8 (H) 10/02/2020   She does push water intake, at minimum 5 glasses of ? 16 oz. Last GFR: Lab Results  Component Value Date   GFRNONAA 73 10/12/2020   GFRNONAA >60 10/09/2020   GFRNONAA >60 10/04/2020   Patient is on Vitamin D supplement,  taking 3000 IU but admits irregular recently , last check was:   Lab Results  Component Value Date   VD25OH 48 08/29/2020      Medication Review: Current Outpatient Medications on File Prior to Visit  Medication Sig Dispense Refill   apixaban (ELIQUIS) 2.5 MG TABS tablet Take 1 tablet (2.5 mg total) by mouth 2 (two) times daily. 60 tablet 2   atorvastatin (LIPITOR) 20 MG tablet Take 1 tablet (20 mg total) by mouth daily. 90 tablet 3   latanoprost (XALATAN) 0.005 % ophthalmic solution Place 1 drop into both eyes daily.     LORazepam (ATIVAN) 0.5 MG tablet Take  1/2 to 1 tablet  at Bedtime  ONLY  if needed for Sleep & please try to limit to 5 days /week to avoid Addiction &  Dementia 30 tablet 0   metoprolol succinate (TOPROL-XL) 25 MG 24 hr tablet Take 1 tablet (25 mg total) by mouth daily. 90 tablet 0   polyethylene glycol (MIRALAX / GLYCOLAX) 17 g packet Take 17 g by mouth 2 (two) times daily. (Patient taking differently: Take 17 g by mouth daily as needed.) 60 each 0   No current facility-administered medications on file prior to visit.    Allergies  Allergen Reactions   Adhesive [Tape]    Advil [Ibuprofen]    Atenolol     Current Problems (verified) Patient Active Problem List   Diagnosis Date Noted   Aortic atherosclerosis (HCC) - CT 09/2020 10/11/2020   Pulmonary emphysema (HCC) - per CT 09/2020 10/11/2020   Slow transit constipation    Hypoalbuminemia due to protein-calorie malnutrition (HCC)    Chronic combined systolic and diastolic CHF (congestive heart failure) (HCC)    Chronic low back pain without sciatica    History of embolic stroke 10/01/2020   DNR no code (do not resuscitate) 10/01/2020   Prediabetes 10/01/2020  Thrombocytosis 10/01/2020   BMI 22.0-22.9, adult 05/15/2020   CKD (chronic kidney disease) stage 3, GFR 30-59 ml/min (HCC) 05/15/2020   Labile hypertension 06/16/2018   FHx: heart disease 06/16/2018   Former smoker 06/16/2018   Abnormal glucose 06/16/2018   Anxiety 03/04/2018   Medication management 04/08/2014   Vitamin D deficiency 07/01/2013   Hyperlipidemia, mixed 07/01/2013   LBBB (left bundle branch block) 02/20/2010    SURGICAL HISTORY She  has a past surgical history that includes Glaucoma surgery (2013) and Tonsillectomy. FAMILY HISTORY Her family history includes Diabetes in her father; Heart attack in her brother, brother, and father; Heart disease in her brother and brother; Hypertension in her mother; Stroke in her mother. SOCIAL HISTORY She  reports that she quit smoking about 41 years ago. She has never used smokeless tobacco. She reports that she does not drink alcohol and does not use drugs.  Review  of Systems  Constitutional:  Negative for malaise/fatigue and weight loss.  HENT:  Negative for hearing loss and tinnitus.   Eyes:  Negative for blurred vision and double vision.  Respiratory:  Negative for cough, sputum production, shortness of breath and wheezing.   Cardiovascular:  Negative for chest pain, palpitations, orthopnea, claudication, leg swelling and PND.  Gastrointestinal:  Negative for abdominal pain, blood in stool, constipation, diarrhea, heartburn, melena, nausea and vomiting.  Genitourinary: Negative.   Musculoskeletal:  Positive for joint pain (hips intermittently, mild). Negative for back pain, falls and myalgias.  Skin:  Negative for rash.  Neurological:  Negative for dizziness, tingling, sensory change, weakness and headaches.  Endo/Heme/Allergies:  Negative for polydipsia.  Psychiatric/Behavioral:  Negative for depression, memory loss, substance abuse and suicidal ideas. The patient has insomnia. The patient is not nervous/anxious.   All other systems reviewed and are negative.   Objective:     Today's Vitals   01/08/21 0937  BP: 122/76  Pulse: 96  Temp: 97.7 F (36.5 C)  SpO2: 96%  Weight: 107 lb (48.5 kg)  Height: 5\' 2"  (1.575 m)   Body mass index is 19.57 kg/m.  General appearance: alert, no distress, WD/WN, female HEENT: normocephalic, sclerae anicteric, TMs pearly, nares patent, no discharge or erythema, pharynx normal Oral cavity: MMM, no lesions Neck: supple, no lymphadenopathy, no thyromegaly, no masses Heart: RRR, normal S1, S2, no murmurs Lungs: CTA bilaterally, no wheezes, rhonchi, or rales Abdomen: +bs, soft, non tender, non distended, no masses, no hepatomegaly, no splenomegaly Musculoskeletal: nontender, no swelling, mild/mod kyphosis  Extremities: no edema, no cyanosis, no clubbing Pulses: 2+ symmetric, upper and lower extremities, normal cap refill Neurological: alert, oriented x 3, CN2-12 intact, strength normal upper extremities and  lower extremities, sensation normal throughout, DTRs 2+ throughout, no cerebellar signs, gait normal Psychiatric: normal affect, behavior normal, pleasant     , NP   01/08/2021

## 2021-01-08 ENCOUNTER — Ambulatory Visit (INDEPENDENT_AMBULATORY_CARE_PROVIDER_SITE_OTHER): Payer: Medicare Other | Admitting: Adult Health

## 2021-01-08 ENCOUNTER — Other Ambulatory Visit: Payer: Self-pay

## 2021-01-08 VITALS — BP 122/76 | HR 96 | Temp 97.7°F | Ht 62.0 in | Wt 107.0 lb

## 2021-01-08 DIAGNOSIS — I5042 Chronic combined systolic (congestive) and diastolic (congestive) heart failure: Secondary | ICD-10-CM

## 2021-01-08 DIAGNOSIS — Z6822 Body mass index (BMI) 22.0-22.9, adult: Secondary | ICD-10-CM | POA: Diagnosis not present

## 2021-01-08 DIAGNOSIS — Z8673 Personal history of transient ischemic attack (TIA), and cerebral infarction without residual deficits: Secondary | ICD-10-CM

## 2021-01-08 DIAGNOSIS — R0989 Other specified symptoms and signs involving the circulatory and respiratory systems: Secondary | ICD-10-CM

## 2021-01-08 DIAGNOSIS — E46 Unspecified protein-calorie malnutrition: Secondary | ICD-10-CM

## 2021-01-08 DIAGNOSIS — E559 Vitamin D deficiency, unspecified: Secondary | ICD-10-CM

## 2021-01-08 DIAGNOSIS — N1831 Chronic kidney disease, stage 3a: Secondary | ICD-10-CM

## 2021-01-08 DIAGNOSIS — F419 Anxiety disorder, unspecified: Secondary | ICD-10-CM | POA: Diagnosis not present

## 2021-01-08 DIAGNOSIS — E8809 Other disorders of plasma-protein metabolism, not elsewhere classified: Secondary | ICD-10-CM

## 2021-01-08 DIAGNOSIS — J439 Emphysema, unspecified: Secondary | ICD-10-CM | POA: Diagnosis not present

## 2021-01-08 DIAGNOSIS — R7303 Prediabetes: Secondary | ICD-10-CM

## 2021-01-08 DIAGNOSIS — E782 Mixed hyperlipidemia: Secondary | ICD-10-CM

## 2021-01-08 DIAGNOSIS — R7309 Other abnormal glucose: Secondary | ICD-10-CM

## 2021-01-08 DIAGNOSIS — Z79899 Other long term (current) drug therapy: Secondary | ICD-10-CM

## 2021-01-08 DIAGNOSIS — I700 Atherosclerosis of aorta: Secondary | ICD-10-CM | POA: Diagnosis not present

## 2021-01-08 NOTE — Patient Instructions (Addendum)
Try to avoid/reduce lorazepam use Best to avoid doing daily   Try low transition to melatonin (safest) Can use benadryl with in the short term if needed Or can send in a prescription option such as trazodone-    Can try melatonin 5mg -15 mg at night for sleep, can also do benadryl 25-50mg  at night for sleep.  If this does not help we can try prescription medication.  Also here is some information about good sleep hygiene.   Insomnia Insomnia is frequent trouble falling and/or staying asleep. Insomnia can be a long term problem or a short term problem. Both are common. Insomnia can be a short term problem when the wakefulness is related to a certain stress or worry. Long term insomnia is often related to ongoing stress during waking hours and/or poor sleeping habits. Overtime, sleep deprivation itself can make the problem worse. Every little thing feels more severe because you are overtired and your ability to cope is decreased. CAUSES  Stress, anxiety, and depression. Poor sleeping habits. Distractions such as TV in the bedroom. Naps close to bedtime. Engaging in emotionally charged conversations before bed. Technical reading before sleep. Alcohol and other sedatives. They may make the problem worse. They can hurt normal sleep patterns and normal dream activity. Stimulants such as caffeine for several hours prior to bedtime. Pain syndromes and shortness of breath can cause insomnia. Exercise late at night. Changing time zones may cause sleeping problems (jet lag). It is sometimes helpful to have someone observe your sleeping patterns. They should look for periods of not breathing during the night (sleep apnea). They should also look to see how long those periods last. If you live alone or observers are uncertain, you can also be observed at a sleep clinic where your sleep patterns will be professionally monitored. Sleep apnea requires a checkup and treatment. Give your caregivers your  medical history. Give your caregivers observations your family has made about your sleep.  SYMPTOMS  Not feeling rested in the morning. Anxiety and restlessness at bedtime. Difficulty falling and staying asleep. TREATMENT  Your caregiver may prescribe treatment for an underlying medical disorders. Your caregiver can give advice or help if you are using alcohol or other drugs for self-medication. Treatment of underlying problems will usually eliminate insomnia problems. Medications can be prescribed for short time use. They are generally not recommended for lengthy use. Over-the-counter sleep medicines are not recommended for lengthy use. They can be habit forming. You can promote easier sleeping by making lifestyle changes such as: Using relaxation techniques that help with breathing and reduce muscle tension. Exercising earlier in the day. Changing your diet and the time of your last meal. No night time snacks. Establish a regular time to go to bed. Counseling can help with stressful problems and worry. Soothing music and white noise may be helpful if there are background noises you cannot remove. Stop tedious detailed work at least one hour before bedtime. HOME CARE INSTRUCTIONS  Keep a diary. Inform your caregiver about your progress. This includes any medication side effects. See your caregiver regularly. Take note of: Times when you are asleep. Times when you are awake during the night. The quality of your sleep. How you feel the next day. This information will help your caregiver care for you. Get out of bed if you are still awake after 15 minutes. Read or do some quiet activity. Keep the lights down. Wait until you feel sleepy and go back to bed. Keep regular sleeping and waking  hours. Avoid naps. Exercise regularly. Avoid distractions at bedtime. Distractions include watching television or engaging in any intense or detailed activity like attempting to balance the household  checkbook. Develop a bedtime ritual. Keep a familiar routine of bathing, brushing your teeth, climbing into bed at the same time each night, listening to soothing music. Routines increase the success of falling to sleep faster. Use relaxation techniques. This can be using breathing and muscle tension release routines. It can also include visualizing peaceful scenes. You can also help control troubling or intruding thoughts by keeping your mind occupied with boring or repetitive thoughts like the old concept of counting sheep. You can make it more creative like imagining planting one beautiful flower after another in your backyard garden. During your day, work to eliminate stress. When this is not possible use some of the previous suggestions to help reduce the anxiety that accompanies stressful situations. MAKE SURE YOU:  Understand these instructions. Will watch your condition. Will get help right away if you are not doing well or get worse. Document Released: 06/28/2000 Document Revised: 09/23/2011 Document Reviewed: 07/29/2007 [facility] Patient Information 2015 [place], [place]. This information is not intended to replace advice given to you by your health care provider. Make sure you discuss any questions you have with your health care provider.

## 2021-01-09 ENCOUNTER — Other Ambulatory Visit: Payer: Self-pay | Admitting: Adult Health

## 2021-01-09 ENCOUNTER — Other Ambulatory Visit: Payer: Self-pay | Admitting: Internal Medicine

## 2021-01-09 DIAGNOSIS — N289 Disorder of kidney and ureter, unspecified: Secondary | ICD-10-CM

## 2021-01-09 LAB — COMPLETE METABOLIC PANEL WITH GFR
AG Ratio: 1.7 (calc) (ref 1.0–2.5)
ALT: 17 U/L (ref 6–29)
AST: 16 U/L (ref 10–35)
Albumin: 4.1 g/dL (ref 3.6–5.1)
Alkaline phosphatase (APISO): 52 U/L (ref 37–153)
BUN/Creatinine Ratio: 23 (calc) — ABNORMAL HIGH (ref 6–22)
BUN: 21 mg/dL (ref 7–25)
CO2: 27 mmol/L (ref 20–32)
Calcium: 10.2 mg/dL (ref 8.6–10.4)
Chloride: 105 mmol/L (ref 98–110)
Creat: 0.93 mg/dL — ABNORMAL HIGH (ref 0.60–0.88)
GFR, Est African American: 62 mL/min/{1.73_m2} (ref >=60)
GFR, Est Non African American: 53 mL/min/{1.73_m2} — ABNORMAL LOW (ref >=60)
Globulin: 2.4 g/dL (calc) (ref 1.9–3.7)
Glucose, Bld: 86 mg/dL (ref 65–99)
Potassium: 5.2 mmol/L (ref 3.5–5.3)
Sodium: 139 mmol/L (ref 135–146)
Total Bilirubin: 1 mg/dL (ref 0.2–1.2)
Total Protein: 6.5 g/dL (ref 6.1–8.1)

## 2021-01-09 LAB — CBC WITH DIFFERENTIAL/PLATELET
Absolute Monocytes: 768 cells/uL (ref 200–950)
Basophils Absolute: 80 cells/uL (ref 0–200)
Basophils Relative: 1 %
Eosinophils Absolute: 120 cells/uL (ref 15–500)
Eosinophils Relative: 1.5 %
HCT: 41.5 % (ref 35.0–45.0)
Hemoglobin: 13.7 g/dL (ref 11.7–15.5)
Lymphs Abs: 840 cells/uL — ABNORMAL LOW (ref 850–3900)
MCH: 29.2 pg (ref 27.0–33.0)
MCHC: 33 g/dL (ref 32.0–36.0)
MCV: 88.5 fL (ref 80.0–100.0)
MPV: 10.3 fL (ref 7.5–12.5)
Monocytes Relative: 9.6 %
Neutro Abs: 6192 cells/uL (ref 1500–7800)
Neutrophils Relative %: 77.4 %
Platelets: 401 10*3/uL — ABNORMAL HIGH (ref 140–400)
RBC: 4.69 10*6/uL (ref 3.80–5.10)
RDW: 14.3 % (ref 11.0–15.0)
Total Lymphocyte: 10.5 %
WBC: 8 10*3/uL (ref 3.8–10.8)

## 2021-01-09 LAB — MAGNESIUM: Magnesium: 2.3 mg/dL (ref 1.5–2.5)

## 2021-01-09 LAB — TSH: TSH: 1.99 mIU/L (ref 0.40–4.50)

## 2021-01-10 ENCOUNTER — Other Ambulatory Visit: Payer: Self-pay

## 2021-01-10 ENCOUNTER — Ambulatory Visit (HOSPITAL_COMMUNITY): Payer: Medicare Other | Attending: Cardiovascular Disease

## 2021-01-10 ENCOUNTER — Encounter: Payer: Self-pay | Admitting: Physical Medicine & Rehabilitation

## 2021-01-10 ENCOUNTER — Other Ambulatory Visit: Payer: Medicare Other | Admitting: *Deleted

## 2021-01-10 ENCOUNTER — Encounter: Payer: Medicare Other | Attending: Physical Medicine & Rehabilitation | Admitting: Physical Medicine & Rehabilitation

## 2021-01-10 VITALS — BP 131/87 | HR 91 | Temp 98.6°F | Ht 62.0 in | Wt 106.0 lb

## 2021-01-10 DIAGNOSIS — I10 Essential (primary) hypertension: Secondary | ICD-10-CM | POA: Diagnosis not present

## 2021-01-10 DIAGNOSIS — I429 Cardiomyopathy, unspecified: Secondary | ICD-10-CM | POA: Diagnosis not present

## 2021-01-10 DIAGNOSIS — R0989 Other specified symptoms and signs involving the circulatory and respiratory systems: Secondary | ICD-10-CM | POA: Diagnosis not present

## 2021-01-10 DIAGNOSIS — Z8673 Personal history of transient ischemic attack (TIA), and cerebral infarction without residual deficits: Secondary | ICD-10-CM | POA: Diagnosis not present

## 2021-01-10 DIAGNOSIS — I5042 Chronic combined systolic (congestive) and diastolic (congestive) heart failure: Secondary | ICD-10-CM | POA: Diagnosis not present

## 2021-01-10 DIAGNOSIS — I639 Cerebral infarction, unspecified: Secondary | ICD-10-CM | POA: Diagnosis not present

## 2021-01-10 DIAGNOSIS — E782 Mixed hyperlipidemia: Secondary | ICD-10-CM

## 2021-01-10 DIAGNOSIS — I6340 Cerebral infarction due to embolism of unspecified cerebral artery: Secondary | ICD-10-CM | POA: Diagnosis not present

## 2021-01-10 LAB — HEPATIC FUNCTION PANEL
ALT: 21 IU/L (ref 0–32)
AST: 20 IU/L (ref 0–40)
Albumin: 4.2 g/dL (ref 3.5–4.6)
Alkaline Phosphatase: 57 IU/L (ref 44–121)
Bilirubin Total: 0.6 mg/dL (ref 0.0–1.2)
Bilirubin, Direct: 0.2 mg/dL (ref 0.00–0.40)
Total Protein: 6.7 g/dL (ref 6.0–8.5)

## 2021-01-10 LAB — LIPID PANEL
Chol/HDL Ratio: 1.8 ratio (ref 0.0–4.4)
Cholesterol, Total: 114 mg/dL (ref 100–199)
HDL: 65 mg/dL (ref >39)
LDL Chol Calc (NIH): 38 mg/dL (ref 0–99)
Triglycerides: 46 mg/dL (ref 0–149)
VLDL Cholesterol Cal: 11 mg/dL (ref 5–40)

## 2021-01-10 LAB — ECHOCARDIOGRAM COMPLETE
Area-P 1/2: 3.83 cm2
S' Lateral: 4.9 cm

## 2021-01-10 NOTE — Patient Instructions (Signed)
PLEASE FEEL FREE TO CALL OUR OFFICE WITH ANY PROBLEMS OR QUESTIONS ([phone])      

## 2021-01-10 NOTE — Progress Notes (Signed)
Subjective:    Patient ID: Leslie [name], female    DOB: 1927-01-11, 87 y.o.   MRN: [id number]  HPI  This is an outpatient follow-up visit for Leslie [name] who is here today after her inpatient rehab stay for by cerebral cardioembolic infarcts.  She has continued to progress nicely.  She is walking without assistive device.  She denies any falls or near mishaps.  She is able to perform all for personal care items without difficulty.  She walks about 30 minutes 5 or 6 days a week.  She has a 2 acre lot which she can walk around.  She already walked this morning before coming in to see me.  She is sleeping fairly well.  She does use Ativan to help her wind down before she rests.  She remains on Eliquis per cardiology recommendations.  She denies any significant bleeding sequelae from this.  She does have bruising on her limbs.  Bowel and bladder function is within normal limits.  Mood is quite positive.   Pain Inventory Average Pain 0 Pain Right Now 0 My pain is  none  LOCATION OF PAIN  none  BOWEL Number of stools per week: . Oral laxative use Yes  Type of laxative miralax Enema or suppository use No  History of colostomy No  Incontinent No   BLADDER Normal In and out cath, frequency .  Able to self cath  . Bladder incontinence No  Frequent urination No  Leakage with coughing No  Difficulty starting stream No  Incomplete bladder emptying No    Mobility walk without assistance how many minutes can you walk? 30 ability to climb steps?  yes do you drive?  yes Do you have any goals in this area?  no  Function retired  Neuro/Psych No problems in this area  Prior Studies Any changes since last visit?  no  Physicians involved in your care Any changes since last visit?  no   Family History  Problem Relation Age of Onset   Heart attack Brother    Heart disease Brother    Heart attack Brother    Heart disease Brother    Hypertension Mother    Stroke Mother     Heart attack Father    Diabetes Father    Social History   Socioeconomic History   Marital status: Married    Spouse name: Not on file   Number of children: Not on file   Years of education: Not on file   Highest education level: Not on file  Occupational History   Not on file  Tobacco Use   Smoking status: Former    Pack years: 0.00    Types: Cigarettes    Quit date: 07/16/1979    Years since quitting: 41.5   Smokeless tobacco: Never  Vaping Use   Vaping Use: Never used  Substance and Sexual Activity   Alcohol use: No    Alcohol/week: 0.0 standard drinks   Drug use: No   Sexual activity: Not Currently  Other Topics Concern   Not on file  Social History Narrative   Not on file   Social Determinants of Health   Financial Resource Strain: Not on file  Food Insecurity: Not on file  Transportation Needs: Not on file  Physical Activity: Not on file  Stress: Not on file  Social Connections: Not on file   Past Surgical History:  Procedure Laterality Date   GLAUCOMA SURGERY  2013   TONSILLECTOMY  Past Medical History:  Diagnosis Date   Anxiety disorder    Cardiomyopathy (HCC)    Glaucoma    Hearing loss    HTN (hypertension)    Hyperglycemia    Hyperlipidemia    LBBB (left bundle branch block)    Stroke (cerebrum) (HCC)    Vitamin D deficiency    BP 131/87   Pulse 91   Temp 98.6 F (37 C)   Ht 5\' 2"  (1.575 m)   Wt 106 lb (48.1 kg)   SpO2 98%   BMI 19.39 kg/m   Opioid Risk Score:   Fall Risk Score:  `1  Depression screen PHQ 2/9  Depression screen [facility] 2/9 11/16/2020 11/08/2020 08/29/2020 05/17/2020 02/01/2020 07/10/2019 07/10/2019  Decreased Interest 0 0 0 0 0 0 0  Down, Depressed, Hopeless 0 0 0 0 0 0 0  PHQ - 2 Score 0 0 0 0 0 0 0  Altered sleeping 1 - - - - - -  Tired, decreased energy 0 - - - - - -  Change in appetite 0 - - - - - -  Feeling bad or failure about yourself  0 - - - - - -  Trouble concentrating 0 - - - - - -  Moving slowly or  fidgety/restless 0 - - - - - -  Suicidal thoughts 0 - - - - - -  PHQ-9 Score 1 - - - - - -    Review of Systems  Constitutional: Negative.   HENT: Negative.    Eyes: Negative.   Respiratory: Negative.    Cardiovascular: Negative.   Gastrointestinal: Negative.   Endocrine: Negative.   Genitourinary: Negative.   Musculoskeletal: Negative.   Skin: Negative.   Allergic/Immunologic: Negative.   Neurological: Negative.   Psychiatric/Behavioral: Negative.    All other systems reviewed and are negative.     Objective:   Physical Exam Gen: no distress, normal appearing HEENT: oral mucosa pink and moist, NCAT Cardio: Reg rate Chest: normal effort, normal rate of breathing Abd: soft, non-distended Ext: no edema Psych: pleasant, normal affect Skin: intact.  The few bruises on the upper limbs noted today. Neuro: Patient is alert and oriented to person place month year.  She missed the day.  She recalled that 4 July was coming up with some extra time.  Demonstrated good insight and awareness.  Normal language.  Normal speech.  Strength is 5 out of 5.  Normal sensation.  Balance was excellent.  Romberg was negative.  Gait was normal. Musculoskeletal: Full ROM, No pain with AROM or PROM in the neck, trunk, or extremities. Posture appropriate        Assessment & Plan:   1.  Functional deficits and left hemiparesis secondary to bi-cerebral cardio-embolic infarcts              Independent at household level  -Continue with home exercise program.  She is walking daily which is great. 2.  Antithrombotics: - Eliquis per cardiology and primary               3. Chronic neck/back painPain Management:              resolved at this point  -Discussed appropriate posture 4. Mood:  very positive  -Has used Ativan chronically to help with sleep  5. HTN: Fairly well controlled with Toprol. 9. Cardiomyopathy/combined CHF/Asymptomatic episode NSVT: Continue Toprol.  Encouraged her to monitor her  weights at home.  She does have a scale.  10. Prediabetes: per primary           Fifteen minutes of face to face patient care time were spent during this visit. All questions were encouraged and answered.  Follow up with me as needed.

## 2021-02-01 ENCOUNTER — Other Ambulatory Visit: Payer: Self-pay

## 2021-02-01 ENCOUNTER — Encounter: Payer: Self-pay | Admitting: Cardiology

## 2021-02-01 ENCOUNTER — Ambulatory Visit (INDEPENDENT_AMBULATORY_CARE_PROVIDER_SITE_OTHER): Payer: Medicare Other | Admitting: Cardiology

## 2021-02-01 VITALS — BP 120/70 | HR 91 | Ht 62.0 in | Wt 107.0 lb

## 2021-02-01 DIAGNOSIS — I639 Cerebral infarction, unspecified: Secondary | ICD-10-CM

## 2021-02-01 DIAGNOSIS — I429 Cardiomyopathy, unspecified: Secondary | ICD-10-CM

## 2021-02-01 DIAGNOSIS — I6340 Cerebral infarction due to embolism of unspecified cerebral artery: Secondary | ICD-10-CM | POA: Diagnosis not present

## 2021-02-01 DIAGNOSIS — I10 Essential (primary) hypertension: Secondary | ICD-10-CM

## 2021-02-01 NOTE — Progress Notes (Signed)
Cardiology Office Note:    Date:  02/01/2021   ID:  Leslie [name], DOB 22-Feb-1927, MRN [id number]  PCP:  Lucky Cowboy, MD   [facility] Providers Cardiologist:  Donato Schultz, MD     Referring MD: Lucky Cowboy, MD    History of Present Illness:    Leslie [name] is a 87 y.o. female here for follow-up of hypertension hyperlipidemia cardiomyopathy with EF of 25%.  Admitted back on 10/01/2020 with facial droop slurred speech.  MRI revealed several small punctate infarcts in the right frontoparietal vertex and left cerebellum.  Stroke was felt to be possibly secondary to embolic source with a low EF and apixaban was recommended until EF is improved.  I saw her in the hospital setting for input on this matter.  Low-dose beta-blocker was added due to asymptomatic NSVT.  Her son lives in [place].  She is done well post COVID.  She walks for 30 minutes around her 2 acre property.   Past Medical History:  Diagnosis Date   Anxiety disorder    Cardiomyopathy (HCC)    Glaucoma    Hearing loss    HTN (hypertension)    Hyperglycemia    Hyperlipidemia    LBBB (left bundle branch block)    Stroke (cerebrum) (HCC)    Vitamin D deficiency     Past Surgical History:  Procedure Laterality Date   GLAUCOMA SURGERY  2013   TONSILLECTOMY      Current Medications: Current Meds  Medication Sig   apixaban (ELIQUIS) 2.5 MG TABS tablet Take 1 tablet (2.5 mg total) by mouth 2 (two) times daily.   atorvastatin (LIPITOR) 20 MG tablet Take 1 tablet (20 mg total) by mouth daily.   CHOLECALCIFEROL PO Take 3,000 Units by mouth daily.   latanoprost (XALATAN) 0.005 % ophthalmic solution Place 1 drop into both eyes daily.   LORazepam (ATIVAN) 0.5 MG tablet TAKE 1/2-1 TABLET BY MOUTH AT BEDTIME ONLY IF NEEDED FOR SLEEP AND PLEASE TRY TO LIMIT TO 5 DAYS A WEEK TO AVOID ADDICTION AND DEMENTIA   metoprolol succinate (TOPROL-XL) 25 MG 24 hr tablet Take 1 tablet (25 mg total) by mouth daily.    polyethylene glycol (MIRALAX / GLYCOLAX) 17 g packet Take 17 g by mouth 2 (two) times daily.     Allergies:   Adhesive [tape], Advil [ibuprofen], and Atenolol   Social History   Socioeconomic History   Marital status: Married    Spouse name: Not on file   Number of children: Not on file   Years of education: Not on file   Highest education level: Not on file  Occupational History   Not on file  Tobacco Use   Smoking status: Former    Types: Cigarettes    Quit date: 07/16/1979    Years since quitting: 41.5   Smokeless tobacco: Never  Vaping Use   Vaping Use: Never used  Substance and Sexual Activity   Alcohol use: No    Alcohol/week: 0.0 standard drinks   Drug use: No   Sexual activity: Not Currently  Other Topics Concern   Not on file  Social History Narrative   Not on file   Social Determinants of Health   Financial Resource Strain: Not on file  Food Insecurity: Not on file  Transportation Needs: Not on file  Physical Activity: Not on file  Stress: Not on file  Social Connections: Not on file     Family History: The patient's family history includes Diabetes  in her father; Heart attack in her brother, brother, and father; Heart disease in her brother and brother; Hypertension in her mother; Stroke in her mother.  ROS:   Please see the history of present illness.     All other systems reviewed and are negative.  EKGs/Labs/Other Studies Reviewed:    The following studies were reviewed today Echocardiogram 09/2020 with EF 25% akinesis of the septum inferior and apical walls.  Repeat echocardiogram in 01/10/2021:  1. Left ventricular ejection fraction, by estimation, is 20 to 25%. The  left ventricle has severely decreased function. The left ventricle  demonstrates global hypokinesis. The left ventricular internal cavity size  was mildly dilated. Indeterminate  diastolic filling due to E-A fusion. There is severe akinesis of the left  ventricular, entire inferior  wall, apical segment and inferoseptal wall.   2. Right ventricular systolic function is normal. The right ventricular  size is normal.   3. Left atrial size was mildly dilated.   4. The mitral valve is abnormal. Mild mitral valve regurgitation.   5. The aortic valve is tricuspid. Aortic valve regurgitation is not  visualized.   6. The inferior vena cava is normal in size with greater than 50%  respiratory variability, suggesting right atrial pressure of 3 mmHg.   Comparison(s): No significant change from prior study. 10/02/2020: LVEF  25%, global HK with inferior, septal and apical akinesis.    Recent Labs: 01/08/2021: BUN 21; Creat 0.93; Hemoglobin 13.7; Magnesium 2.3; Platelets 401; Potassium 5.2; Sodium 139; TSH 1.99 01/10/2021: ALT 21  Recent Lipid Panel    Component Value Date/Time   CHOL 114 01/10/2021 0827   TRIG 46 01/10/2021 0827   HDL 65 01/10/2021 0827   CHOLHDL 1.8 01/10/2021 0827   CHOLHDL 2.9 10/01/2020 1801   VLDL 10 10/01/2020 1801   LDLCALC 38 01/10/2021 0827   LDLCALC 100 (H) 05/17/2020 1229     Risk Assessment/Calculations:          Physical Exam:    VS:  BP 120/70 (BP Location: Left Arm, Patient Position: Sitting, Cuff Size: Normal)   Pulse 91   Ht 5\' 2"  (1.575 m)   Wt 107 lb (48.5 kg)   SpO2 96%   BMI 19.57 kg/m     Wt Readings from Last 3 Encounters:  02/01/21 107 lb (48.5 kg)  01/10/21 106 lb (48.1 kg)  01/08/21 107 lb (48.5 kg)     GEN:  Well nourished, well developed in no acute distress HEENT: Normal, Thin NECK: No JVD; No carotid bruits LYMPHATICS: No lymphadenopathy CARDIAC: RRR, no murmurs, rubs, gallops RESPIRATORY:  Clear to auscultation without rales, wheezing or rhonchi  ABDOMEN: Soft, non-tender, non-distended MUSCULOSKELETAL:  No edema; No deformity  SKIN: Warm and dry NEUROLOGIC:  Alert and oriented x 3 PSYCHIATRIC:  Normal affect   ASSESSMENT:    1. Cardiomyopathy, unspecified type (HCC)   2. Cerebrovascular accident  (CVA) due to embolism of cerebral artery (HCC)   3. Essential hypertension    PLAN:    In order of problems listed above:  Chronic systolic heart failure/cardiomyopathy - Ejection fraction remains 20 to 25% on echocardiogram repeat.  Continue with medical management, on Toprol 25. -Currently NYHA class I symptoms.  Walking 30 minutes a day throughout her driveway, 2 acres without difficulty.  She still lives alone but has help from her son's cousin who's takes her to lunch every day.  She goes to the church on Wednesday, Sundays twice.  Enjoys socializing.  No  chest pain, no shortness of breath.  Continue with current medical management.  Stroke - Given her poor EF, thought to be secondary to possible depressed EF.  It is reasonable to continue with low-dose Eliquis at this point.  No bleeding symptoms.  Hyperlipidemia - On atorvastatin 20 mg daily.  LDL 38.  Excellent.     Medication Adjustments/Labs and Tests Ordered: Current medicines are reviewed at length with the patient today.  Concerns regarding medicines are outlined above.  No orders of the defined types were placed in this encounter.  No orders of the defined types were placed in this encounter.   Patient Instructions  Medication Instructions:  The current medical regimen is effective;  continue present plan and medications.  *If you need a refill on your cardiac medications before your next appointment, please call your pharmacy*  Follow-Up: At [facility], you and your health needs are our priority.  As part of our continuing mission to provide you with exceptional heart care, we have created designated Provider Care Teams.  These Care Teams include your primary Cardiologist (physician) and Advanced Practice Providers (APPs -  Physician Assistants and Nurse Practitioners) who all work together to provide you with the care you need, when you need it.  We recommend signing up for the patient portal called "MyChart".   Sign up information is provided on this After Visit Summary.  MyChart is used to connect with patients for Virtual Visits (Telemedicine).  Patients are able to view lab/test results, encounter notes, upcoming appointments, etc.  Non-urgent messages can be sent to your provider as well.   To learn more about what you can do with MyChart, go to [email or web address].    Your next appointment:   6 month(s)  The format for your next appointment:   In Person  Provider:  a NP or PA and Donato Schultz, MD in 1 year.   Thank you for choosing [facility]!!     Signed, Donato Schultz, MD  02/01/2021 4:22 PM    [facility]

## 2021-02-01 NOTE — Patient Instructions (Addendum)
Medication Instructions:  The current medical regimen is effective;  continue present plan and medications.  *If you need a refill on your cardiac medications before your next appointment, please call your pharmacy*  Follow-Up: At [facility], you and your health needs are our priority.  As part of our continuing mission to provide you with exceptional heart care, we have created designated Provider Care Teams.  These Care Teams include your primary Cardiologist (physician) and Advanced Practice Providers (APPs -  Physician Assistants and Nurse Practitioners) who all work together to provide you with the care you need, when you need it.  We recommend signing up for the patient portal called "MyChart".  Sign up information is provided on this After Visit Summary.  MyChart is used to connect with patients for Virtual Visits (Telemedicine).  Patients are able to view lab/test results, encounter notes, upcoming appointments, etc.  Non-urgent messages can be sent to your provider as well.   To learn more about what you can do with MyChart, go to [email or web address].    Your next appointment:   6 month(s)  The format for your next appointment:   In Person  Provider:  a NP or PA and Donato Schultz, MD in 1 year.   Thank you for choosing [facility]!!

## 2021-02-05 ENCOUNTER — Encounter: Payer: Medicare Other | Admitting: Internal Medicine

## 2021-02-07 ENCOUNTER — Other Ambulatory Visit: Payer: Self-pay | Admitting: Adult Health

## 2021-02-08 ENCOUNTER — Other Ambulatory Visit: Payer: Self-pay

## 2021-02-08 ENCOUNTER — Ambulatory Visit (INDEPENDENT_AMBULATORY_CARE_PROVIDER_SITE_OTHER): Payer: Medicare Other | Admitting: Adult Health

## 2021-02-08 ENCOUNTER — Encounter: Payer: Self-pay | Admitting: Adult Health

## 2021-02-08 VITALS — BP 136/87 | HR 96 | Ht 62.0 in | Wt 106.0 lb

## 2021-02-08 DIAGNOSIS — I5042 Chronic combined systolic (congestive) and diastolic (congestive) heart failure: Secondary | ICD-10-CM

## 2021-02-08 DIAGNOSIS — I10 Essential (primary) hypertension: Secondary | ICD-10-CM

## 2021-02-08 DIAGNOSIS — E785 Hyperlipidemia, unspecified: Secondary | ICD-10-CM | POA: Diagnosis not present

## 2021-02-08 DIAGNOSIS — I639 Cerebral infarction, unspecified: Secondary | ICD-10-CM | POA: Diagnosis not present

## 2021-02-08 NOTE — Progress Notes (Signed)
I agree with the above plan 

## 2021-02-08 NOTE — Progress Notes (Signed)
[facility] [address] [place] [id number] [phone]       STROKE FOLLOW UP NOTE  [name] Date of Birth:  08/30/1926 Medical Record Number:  [id number]   Reason for Referral: stroke follow up    SUBJECTIVE:   CHIEF COMPLAINT:  Chief Complaint  Patient presents with   Follow-up    RM 3 with son Leslie [name] Pt is well and stable, no complaints      HPI:   Today, 02/08/2021, [name] returns for 3095-month stroke follow-up accompanied by her son, Leslie [name].  Stable since prior visit without new stroke/TIA symptoms. Denies any residual stroke deficits - son believes she is doing better than she was before. She does walk around her yard/driveway for 30 min per day without difficulty. Denies any recent falls.  Continues to maintain ADLs and IADLs independently without difficulty.  Family checks on her routinely.  She has since returned back to driving short distance during the day time only - denies any difficulty.  Reports compliance on Eliquis and atorvastatin without associated side effects.  Blood pressure today 136/87. Blood pressure stable at home. Had follow-up visit with cardiologist Dr. Anne FuSkains with repeat 2D echo showing continued EF 20 to 25%. No concerns today.     History provided for reference purposes only Initial visit 11/08/2020 JM: [name] is being seen for hospital follow-up accompanied by her son  [facility] with recommended outpatient therapies.  Currently working with outpatient therapy for residual mild cognitive impairment with short-term memory loss and mild LLE weakness but overall greatly improving.  Ambulates without assistive device and denies any recent falls.  She continues to live independently with family checking in on her routinely maintaining ADLs and IADLs independently without difficulty.  She questions return to driving as she was driving previously. Denies new stroke/TIA  symptoms  Reports compliance on Eliquis 2.5 mg twice daily with mild bruising but no bleeding as well as atorvastatin 20 mg daily without associated side effects.  Blood pressure today 142/92.  Routinely monitors at home and typically 110-120/90s.  Has scheduled follow-up with cardiology 5/18 She was seen by her PCP on 3/31  Son is requesting medication refills that she was discharged on from hospital  No further concerns at this time  Stroke admission 10/01/2020 [name] is a 87 y.o. female with history of HTN, HLD, pre DMII, glaucoma, and Vit D deficiency who presented on 10/01/2020 with slurred speech with a left facial droop.  Personally reviewed hospitalization pertinent progress notes, lab work and imaging summary provided.  Stroke work-up revealed small left cerebellar and several punctate right frontal parietal cortex infarcts, etiology likely due to cardiomyopathy with low EF although occult A. Fib also possibility.  Recommended initiating Eliquis 2.5 mg twice daily in setting of cardiomyopathy with a EF of 25% and recommended if EF improves after 3 months can switch back to antiplatelet therapy.  Advised to follow-up with cardiology outpatient.  HTN stable.  LDL 78 and initiate atorvastatin 20 mg daily.  Other stroke risk factors include advanced age.  Also treated for UTI with culture positive for E. Coli.  Evaluated by therapies who recommended discharge to CIR for ongoing therapy needs.  Stroke: Small left cerebellum and several punctate right frontoparietal vertex infarcts, etiology likely due to cardiomyopathy with low EF.  Occult A. fib also possible. Code Stroke CT head No acute abnormality. Atrophy and chronic small-vessel ischemic changes. ASPECTS  10.  CTA head & neck Aortic atherosclerosis and ectasia.  No intracranial large vessel occlusion. Occlusion of the innominate vein MRI  Several small/punctate acute infarctions at the right frontoparietal vertex. Small acute  infarction at the left lateral cerebellum. LE venous dopplers: negative for DVT 2D Echo EF 25%, severely depressed LVEF with akinesis of the septum, inferior and apical walls, hypokinesis elsewhere.  LDL 78 HgbA1c 5.8 VTE prophylaxis - Lovenox Diet: heart healthy No antithrombotic prior to admission, now on aspirin 81 mg daily and clopidogrel 75 mg daily.  Will start Eliquis 2.5mg  bid given her cardiomyopathy and recent stroke and s/p ASA and plavix.  If her EF improves after 3 months, switch back to antiplatelet therapy.   Therapy recommendations:  CIR Disposition:  d/c to CIR on 10/03/2020     ROS:   14 system review of systems performed and negative with exception of those listed in HPI  PMH:  Past Medical History:  Diagnosis Date   Anxiety disorder    Cardiomyopathy (HCC)    Glaucoma    Hearing loss    HTN (hypertension)    Hyperglycemia    Hyperlipidemia    LBBB (left bundle branch block)    Stroke (cerebrum) (HCC)    Vitamin D deficiency     PSH:  Past Surgical History:  Procedure Laterality Date   GLAUCOMA SURGERY  2013   TONSILLECTOMY      Social History:  Social History   Socioeconomic History   Marital status: Married    Spouse name: Not on file   Number of children: Not on file   Years of education: Not on file   Highest education level: Not on file  Occupational History   Not on file  Tobacco Use   Smoking status: Former    Types: Cigarettes    Quit date: 07/16/1979    Years since quitting: 41.5   Smokeless tobacco: Never  Vaping Use   Vaping Use: Never used  Substance and Sexual Activity   Alcohol use: No    Alcohol/week: 0.0 standard drinks   Drug use: No   Sexual activity: Not Currently  Other Topics Concern   Not on file  Social History Narrative   Not on file   Social Determinants of Health   Financial Resource Strain: Not on file  Food Insecurity: Not on file  Transportation Needs: Not on file  Physical Activity: Not on file   Stress: Not on file  Social Connections: Not on file  Intimate Partner Violence: Not on file    Family History:  Family History  Problem Relation Age of Onset   Heart attack Brother    Heart disease Brother    Heart attack Brother    Heart disease Brother    Hypertension Mother    Stroke Mother    Heart attack Father    Diabetes Father     Medications:   Current Outpatient Medications on File Prior to Visit  Medication Sig Dispense Refill   apixaban (ELIQUIS) 2.5 MG TABS tablet Take 1 tablet (2.5 mg total) by mouth 2 (two) times daily. 60 tablet 2   atorvastatin (LIPITOR) 20 MG tablet Take 1 tablet (20 mg total) by mouth daily. 90 tablet 3   CHOLECALCIFEROL PO Take 3,000 Units by mouth daily.     latanoprost (XALATAN) 0.005 % ophthalmic solution Place 1 drop into both eyes daily.     LORazepam (ATIVAN) 0.5 MG tablet TAKE 1/2-1 TABLET BY MOUTH AT BEDTIME ONLY  IF NEEDED FOR SLEEP AND PLEASE TRY TO LIMIT TO 5 DAYS A WEEK TO AVOID ADDICTION AND DEMENTIA 30 tablet 0   metoprolol succinate (TOPROL-XL) 25 MG 24 hr tablet Take 1 tablet (25 mg total) by mouth daily. 90 tablet 0   polyethylene glycol (MIRALAX / GLYCOLAX) 17 g packet Take 17 g by mouth 2 (two) times daily. 60 each 0   No current facility-administered medications on file prior to visit.    Allergies:   Allergies  Allergen Reactions   Adhesive [Tape]    Advil [Ibuprofen]    Atenolol       OBJECTIVE:  Physical Exam  Vitals:   02/08/21 0821  BP: 136/87  Pulse: 96  Weight: 106 lb (48.1 kg)  Height: 5\' 2"  (1.575 m)    Body mass index is 19.39 kg/m. No results found.  General: Frail very pleasant elderly Caucasian female, seated, in no evident distress Head: head normocephalic and atraumatic.   Neck: supple with no carotid or supraclavicular bruits Cardiovascular: regular rate and rhythm, no murmurs Musculoskeletal: no deformity Skin:  no rash/petichiae Vascular:  Normal pulses all extremities    Neurologic Exam Mental Status: Awake and fully alert.   Fluent speech and language.  Oriented to place and time. Recent and remote memory intact. Attention span, concentration and fund of knowledge appropriate during visit. Mood and affect appropriate.   Cranial Nerves: Pupils equal, briskly reactive to light. Extraocular movements full without nystagmus. Visual fields full to confrontation.  HOH bilaterally. Facial sensation intact. Face, tongue, palate moves normally and symmetrically.  Motor: Normal bulk and tone. Normal strength in all tested extremity muscles except mild left hip flexor weakness Sensory.: intact to touch , pinprick , position and vibratory sensation.  Coordination: Rapid alternating movements normal in all extremities. Finger-to-nose and heel-to-shin performed accurately bilaterally. Gait and Station: Arises from chair without difficulty. Stance is normal. Gait demonstrates normal stride length with mild imbalance without use of assistive device.  Reflexes: 1+ and symmetric. Toes downgoing.         ASSESSMENT: BRENEE GAJDA is a 87 y.o. year old female presented with slurred speech and left facial droop on 10/01/2020 with stroke work-up revealing small left cerebellum and several punctate right frontoparietal cortex infarcts, likely secondary to cardiomyopathy with low EF of 25%. Vascular risk factors include cardiomyopathy, HTN, HLD, CHF and advanced age.      PLAN:  L cerebellum and R frontoparietal infarcts:  Residual deficit: Recovered very well with only minimal left hip flexor weakness.  Encouraged continued exercise at home as tolerated Continue Eliquis (apixaban) 2.5 mg twice daily  and atorvastatin 20 mg daily for secondary stroke prevention.   Discussed secondary stroke prevention measures and importance of close PCP follow up for aggressive stroke risk factor management  HTN: BP goal <130/90.  Well-controlled on metoprolol per cardiology HLD: LDL goal <70.  Recent LDL 38 down from 78 -continue atorvastatin 20 mg daily per PCP Cardiomyopathy: EF 20-25% 12/2020 and 09/2020.  Routinely followed by Dr. 10/2020 on Eliquis 2.5 mg twice daily (age and weight) for secondary stroke prevention    Follow up in 6 months or call earlier if needed   CC:  [facility] provider: Dr. Anne Fu PCP: Pearlean Brownie, MD    I spent 29 minutes of face-to-face and non-face-to-face time with patient and son.  This included previsit chart review, lab review, study review, electronic health record documentation, patient education regarding stroke and etiology, secondary stroke prevention measures and importance  of managing stroke risk factors, minimal residual deficits and answered all other questions to patient and sons satisfaction  Ihor Austin, AGNP-BC  [facility] [address] [place], [place] [address]  Phone [phone] Fax [phone] Note: This document was prepared with digital dictation and possible smart phrase technology. Any transcriptional errors that result from this process are unintentional.

## 2021-02-08 NOTE — Patient Instructions (Signed)
Continue Eliquis (apixaban) daily  and atorvastatin  for secondary stroke prevention  Continue to follow with Dr. Anne Fu for eliquis management and monitoring  Continue to follow up with PCP regarding cholesterol and blood pressure management  Maintain strict control of hypertension with blood pressure goal below 130/90  and cholesterol with LDL cholesterol (bad cholesterol) goal below 70 mg/dL.       Followup in the future with me in 6 months or call earlier if needed       Thank you for coming to see [place] at [facility]. I hope we have been able to provide you high quality care today.  You may receive a patient satisfaction survey over the next few weeks. We would appreciate your feedback and comments so that we may continue to improve ourselves and the health of our patients.

## 2021-02-09 ENCOUNTER — Ambulatory Visit (INDEPENDENT_AMBULATORY_CARE_PROVIDER_SITE_OTHER): Payer: Medicare Other

## 2021-02-09 ENCOUNTER — Other Ambulatory Visit: Payer: Self-pay | Admitting: Adult Health

## 2021-02-09 ENCOUNTER — Other Ambulatory Visit: Payer: Self-pay

## 2021-02-09 DIAGNOSIS — N289 Disorder of kidney and ureter, unspecified: Secondary | ICD-10-CM | POA: Diagnosis not present

## 2021-02-09 LAB — BASIC METABOLIC PANEL WITH GFR
BUN/Creatinine Ratio: 30 (calc) — ABNORMAL HIGH (ref 6–22)
BUN: 27 mg/dL — ABNORMAL HIGH (ref 7–25)
CO2: 27 mmol/L (ref 20–32)
Calcium: 10.1 mg/dL (ref 8.6–10.4)
Chloride: 105 mmol/L (ref 98–110)
Creat: 0.89 mg/dL (ref 0.60–0.95)
Glucose, Bld: 96 mg/dL (ref 65–99)
Potassium: 4.8 mmol/L (ref 3.5–5.3)
Sodium: 139 mmol/L (ref 135–146)
eGFR: 61 mL/min/{1.73_m2} (ref >=60)

## 2021-02-09 NOTE — Progress Notes (Signed)
Patient presents to the office for a nurse visit to have labs done. Checking on kidney function today. Patient states that she drinks 16oz of water in the morning along with one cup of coffee, and drinks more water throughout the day. No questions or concerns and no changes with medications. Vitals taken and recorded.

## 2021-02-13 ENCOUNTER — Other Ambulatory Visit: Payer: Self-pay | Admitting: Adult Health

## 2021-02-13 NOTE — Telephone Encounter (Signed)
I refilled back in April bc she did not have cardiology follow-up yet but has since been seen by cardiology who should continue to refill at this point.  Thank you.

## 2021-02-15 ENCOUNTER — Telehealth: Payer: Self-pay | Admitting: Adult Health

## 2021-02-15 ENCOUNTER — Other Ambulatory Visit: Payer: Self-pay | Admitting: Internal Medicine

## 2021-02-15 MED ORDER — METOPROLOL SUCCINATE ER 25 MG PO TB24: ORAL_TABLET | ORAL | 3 refills | Status: DC

## 2021-02-15 NOTE — Telephone Encounter (Signed)
Pt's son Deniece [place] on [place] called to get mothers refill for metoprolol succinate (TOPROL-XL) 25 MG 24 hr tablet. Pharmacy [facility] [phone].

## 2021-02-15 NOTE — Telephone Encounter (Signed)
Contacted son, informed him that during last visit in April, jessica refilled for 3 months and to request future refills from cardio or PCP.  He understood and will reach out to PCP.

## 2021-03-08 ENCOUNTER — Other Ambulatory Visit: Payer: Self-pay | Admitting: Adult Health

## 2021-03-08 ENCOUNTER — Telehealth: Payer: Self-pay

## 2021-03-08 MED ORDER — LORAZEPAM 0.5 MG PO TABS: ORAL_TABLET | ORAL | 0 refills | Status: DC

## 2021-03-08 NOTE — Telephone Encounter (Signed)
Refill request for Lorazepam.  

## 2021-03-08 NOTE — Progress Notes (Signed)
Future Appointments  Date Time Provider Department Center  04/11/2021  3:00 PM Lucky Cowboy, MD [facility] None  05/17/2021 11:00 AM Judd Gaudier, NP [facility] None  08/16/2021  8:45 AM Ihor Austin, NP [facility] None   PDMP reviewed for lorazepam refill request.

## 2021-03-14 ENCOUNTER — Telehealth: Payer: Self-pay | Admitting: Adult Health

## 2021-03-14 MED ORDER — APIXABAN 2.5 MG PO TABS: 2.5000 mg | ORAL_TABLET | Freq: Two times a day (BID) | ORAL | 2 refills | Status: DC

## 2021-03-14 NOTE — Telephone Encounter (Signed)
Eliquis refilled as requested.

## 2021-03-14 NOTE — Telephone Encounter (Signed)
Pt's son [name] called requesting his mothers refill for apixaban (ELIQUIS) 2.5 MG TABS tablet. Pharmacy [facility] [phone].

## 2021-04-11 ENCOUNTER — Encounter: Payer: Self-pay | Admitting: Internal Medicine

## 2021-04-11 ENCOUNTER — Other Ambulatory Visit: Payer: Self-pay

## 2021-04-11 ENCOUNTER — Ambulatory Visit (INDEPENDENT_AMBULATORY_CARE_PROVIDER_SITE_OTHER): Payer: Medicare Other | Admitting: Internal Medicine

## 2021-04-11 VITALS — BP 136/86 | HR 105 | Temp 97.5°F | Resp 16 | Ht 62.0 in | Wt 107.0 lb

## 2021-04-11 DIAGNOSIS — Z136 Encounter for screening for cardiovascular disorders: Secondary | ICD-10-CM | POA: Diagnosis not present

## 2021-04-11 DIAGNOSIS — I5042 Chronic combined systolic (congestive) and diastolic (congestive) heart failure: Secondary | ICD-10-CM

## 2021-04-11 DIAGNOSIS — R7309 Other abnormal glucose: Secondary | ICD-10-CM | POA: Diagnosis not present

## 2021-04-11 DIAGNOSIS — Z87891 Personal history of nicotine dependence: Secondary | ICD-10-CM

## 2021-04-11 DIAGNOSIS — Z1211 Encounter for screening for malignant neoplasm of colon: Secondary | ICD-10-CM

## 2021-04-11 DIAGNOSIS — E559 Vitamin D deficiency, unspecified: Secondary | ICD-10-CM | POA: Diagnosis not present

## 2021-04-11 DIAGNOSIS — R0989 Other specified symptoms and signs involving the circulatory and respiratory systems: Secondary | ICD-10-CM

## 2021-04-11 DIAGNOSIS — I700 Atherosclerosis of aorta: Secondary | ICD-10-CM

## 2021-04-11 DIAGNOSIS — N1831 Chronic kidney disease, stage 3a: Secondary | ICD-10-CM | POA: Diagnosis not present

## 2021-04-11 DIAGNOSIS — Z8249 Family history of ischemic heart disease and other diseases of the circulatory system: Secondary | ICD-10-CM

## 2021-04-11 DIAGNOSIS — E782 Mixed hyperlipidemia: Secondary | ICD-10-CM | POA: Diagnosis not present

## 2021-04-11 DIAGNOSIS — Z1212 Encounter for screening for malignant neoplasm of rectum: Secondary | ICD-10-CM

## 2021-04-11 DIAGNOSIS — Z79899 Other long term (current) drug therapy: Secondary | ICD-10-CM

## 2021-04-11 NOTE — Patient Instructions (Signed)

## 2021-04-11 NOTE — Progress Notes (Signed)
Comprehensive Evaluation &  Examination  Future Appointments  Date Time Provider Department Center  04/11/2021  3:00 PM Lucky Cowboy, MD [facility] None  05/17/2021 11:00 AM Judd Gaudier, NP [facility] None  08/16/2021  8:45 AM Ihor Austin, NP [facility] None  04/11/2022  3:00 PM Lucky Cowboy, MD [facility] None        This very nice 87 y.o. [facility]  presents for a comprehensive evaluation and management of multiple medical co-morbidities.  Patient has been followed for HTN, HLD, Prediabetes  and Vitamin D Deficiency.        HTN predates since  64. Patient's BP has been controlled at home . 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 Patient denies any cardiac symptoms as chest pain, palpitations, shortness of breath, dizziness or ankle swelling. Today's BP was initially slightly elevated & rechecked at goal - 136/86 .       Patient's hyperlipidemia is controlled with diet and medications. Patient denies myalgias or other medication SE's. Last lipids were at goal:  Lab Results  Component Value Date   CHOL 114 01/10/2021   HDL 65 01/10/2021   LDLCALC 38 01/10/2021   TRIG 46 01/10/2021   CHOLHDL 1.8 01/10/2021         Patient has hx/o prediabetes (A1c 5.7% /2012) and patient denies reactive hypoglycemic symptoms, visual blurring, diabetic polys or paresthesias. Last A1c was not at goal:  Lab Results  Component Value Date   HGBA1C 5.8 (H) 10/02/2020         Finally, patient has history of Vitamin D Deficiency and last Vitamin D was low:  Lab Results  Component Value Date   VD25OH 48 08/29/2020     Current Outpatient Medications on File Prior to Visit  Medication Sig   apixaban (ELIQUIS) 2.5 MG TABS  Take 1 tablet  2 times daily.   atorvastatin 20 MG tablet Take 1 tablet daily.   Vitamin D  Take 3,000 Units  daily.    XALATAN 0.005 % ophth  soln Place 1 drop into both eyes daily.   LORazepam 0.5 MG tablet TAKE 1/2-1 TABLET  AT BEDTIME ONLY IF NEEDED    metoprolol succinate -XL 25 MG  Take  1 tablet  Daily  for Heart & BP   polyethylene glycol 17 g packet Take  2 times daily.     Allergies  Allergen Reactions   Adhesive [Tape]    Advil [Ibuprofen]    Atenolol      Past Medical History:  Diagnosis Date   Anxiety disorder    Cardiomyopathy (HCC)    Glaucoma    Hearing loss    HTN (hypertension)    Hyperglycemia    Hyperlipidemia    LBBB (left bundle branch block)    Stroke (cerebrum) (HCC)    Vitamin D deficiency      Health Maintenance  Topic Date Due   Zoster Vaccines- Shingrix (1 of 2) Never done   COVID-19 Vaccine (3 - Pfizer risk series) 11/10/2019   HPV VACCINES  Aged Out   INFLUENZA VACCINE  Discontinued   DEXA SCAN  Discontinued   TETANUS/TDAP  Discontinued     Immunization History  Administered Date(s) Administered   DTaP 05/06/2003   PFIZER   SARS-COV-2 Vacc 09/22/2019, 10/13/2019   Pneumococcal-23 05/06/2003   Zoster, Live 07/16/2007  Last Colon - refuses    Last MGM - refuses   Past Surgical History:  Procedure Laterality Date   GLAUCOMA SURGERY  2013   TONSILLECTOMY       Family History  Problem Relation Age of Onset   Heart attack Brother    Heart disease Brother    Heart attack Brother    Heart disease Brother    Hypertension Mother    Stroke Mother    Heart attack Father    Diabetes Father      Social History   Tobacco Use   Smoking status: Former    Types: Cigarettes    Quit date: 07/16/1979    Years since quitting: 41.7   Smokeless tobacco: Never  Vaping Use   Vaping Use: Never used  Substance Use Topics   Alcohol use: No    Alcohol/week: 0.0 standard drinks   Drug use: No      ROS Constitutional: Denies fever, chills, weight loss/gain, headaches, insomnia,  night sweats, and change in appetite. Does c/o fatigue. Eyes:  Denies redness, blurred vision, diplopia, discharge, itchy, watery eyes.  ENT: Denies discharge, congestion, post nasal drip, epistaxis, sore throat, earache, hearing loss, dental pain, Tinnitus, Vertigo, Sinus pain, snoring.  Cardio: Denies chest pain, palpitations, irregular heartbeat, syncope, dyspnea, diaphoresis, orthopnea, PND, claudication, edema Respiratory: denies cough, dyspnea, DOE, pleurisy, hoarseness, laryngitis, wheezing.  Gastrointestinal: Denies dysphagia, heartburn, reflux, water brash, pain, cramps, nausea, vomiting, bloating, diarrhea, constipation, hematemesis, melena, hematochezia, jaundice, hemorrhoids Genitourinary: Denies dysuria, frequency, urgency, nocturia, hesitancy, discharge, hematuria, flank pain Breast: Breast lumps, nipple discharge, bleeding.  Musculoskeletal: Denies arthralgia, myalgia, stiffness, Jt. Swelling, pain, limp, and strain/sprain. Denies falls. Skin: Denies puritis, rash, hives, warts, acne, eczema, changing in skin lesion Neuro: No weakness, tremor, incoordination, spasms, paresthesia, pain Psychiatric: Denies confusion, memory loss, sensory loss. Denies Depression. Endocrine: Denies change in weight, skin, hair change, nocturia, and paresthesia, diabetic polys, visual blurring, hyper / hypo glycemic episodes.  Heme/Lymph: No excessive bleeding, bruising, enlarged lymph nodes.  Physical Exam  BP 136/86   Pulse (!) 105   Temp (!) 97.5 F (36.4 C)   Resp 16   Ht 5\' 2"  (1.575 m)   Wt 107 lb (48.5 kg)   SpO2 99%   BMI 19.57 kg/m   General Appearance: Well nourished, well groomed and in no apparent distress.  Eyes: PERRLA, EOMs, conjunctiva no swelling or erythema, normal fundi and vessels. Sinuses: No frontal/maxillary tenderness ENT/Mouth: EACs patent / TMs  nl. Nares clear without erythema, swelling, mucoid exudates. Oral hygiene is good. No erythema, swelling, or exudate. Tongue normal, non-obstructing. Tonsils not swollen or erythematous.  Hearing normal.  Neck: Supple, thyroid not palpable. No bruits, nodes or JVD. Respiratory: Respiratory effort normal.  BS equal and clear bilateral without rales, rhonci, wheezing or stridor. Cardio: Heart sounds are normal with regular rate and rhythm and no murmurs, rubs or gallops. Peripheral pulses are normal and equal bilaterally without edema. No aortic or femoral bruits. Chest: symmetric with normal excursions and percussion. Breasts: Atrophic w/o palpable abnormal masses.  Abdomen: Flat, soft with bowel sounds active. Nontender, no guarding, rebound, hernias, masses, or organomegaly.  Lymphatics: Non tender without lymphadenopathy.  Genitourinary:  Musculoskeletal: Full ROM all peripheral extremities, joint stability, 5/5 strength, and normal gait. Skin: Warm and dry without rashes, lesions, cyanosis, clubbing or  ecchymosis.  Neuro: Cranial nerves intact, reflexes equal bilaterally. Normal muscle tone, no cerebellar symptoms. Sensation intact.  Pysch: Alert and oriented X 3, normal affect,  Insight and Judgment appropriate.    Assessment and Plan  1. Labile hypertension  - EKG 12-Lead - Urinalysis, Routine w reflex microscopic - Microalbumin / creatinine urine ratio - CBC with Differential/Platelet - COMPLETE METABOLIC PANEL WITH GFR - Magnesium - TSH  2. Hyperlipidemia, mixed  - Lipid panel - TSH  3. Abnormal glucose  - Hemoglobin A1c - Insulin, random  4. Vitamin D deficiency  - VITAMIN D 25 Hydroxy   5. Aortic atherosclerosis (HCC) - CT 09/2020  - Lipid panel  6. Screening for colorectal cancer  - POC Hemoccult Bld/Stl   7. Stage 3a chronic kidney disease (HCC)  - PTH, intact and calcium  8. Screening for heart disease  - EKG 12-Lead - Urinalysis, Routine w reflex microscopic  9. FHx: heart disease  - EKG 12-Lead  10. Former smoker  - EKG 12-Lead  11. Medication management  - CBC with Differential/Platelet - COMPLETE METABOLIC PANEL WITH  GFR - Magnesium - Lipid panel - TSH - Hemoglobin A1c - Insulin, random - VITAMIN D 25 Hydroxy          Patient was counseled in prudent diet to achieve/maintain BMI less than 25 for weight control, BP monitoring, regular exercise and medications. Discussed med's effects and SE's. Screening labs and tests as requested with regular follow-up as recommended.  Strongly encouraged to get MGM. Over 40 minutes of exam, counseling, chart review and high complex critical decision making was performed.   Marinus Maw, MD

## 2021-04-12 ENCOUNTER — Other Ambulatory Visit: Payer: Self-pay | Admitting: Internal Medicine

## 2021-04-12 DIAGNOSIS — N3001 Acute cystitis with hematuria: Secondary | ICD-10-CM

## 2021-04-12 LAB — COMPLETE METABOLIC PANEL WITH GFR
AG Ratio: 1.4 (calc) (ref 1.0–2.5)
ALT: 16 U/L (ref 6–29)
AST: 18 U/L (ref 10–35)
Albumin: 4.2 g/dL (ref 3.6–5.1)
Alkaline phosphatase (APISO): 72 U/L (ref 37–153)
BUN/Creatinine Ratio: 25 (calc) — ABNORMAL HIGH (ref 6–22)
BUN: 25 mg/dL (ref 7–25)
CO2: 27 mmol/L (ref 20–32)
Calcium: 10.5 mg/dL — ABNORMAL HIGH (ref 8.6–10.4)
Chloride: 103 mmol/L (ref 98–110)
Creat: 1.02 mg/dL — ABNORMAL HIGH (ref 0.60–0.95)
Globulin: 2.9 g/dL (calc) (ref 1.9–3.7)
Glucose, Bld: 88 mg/dL (ref 65–99)
Potassium: 4.9 mmol/L (ref 3.5–5.3)
Sodium: 139 mmol/L (ref 135–146)
Total Bilirubin: 0.8 mg/dL (ref 0.2–1.2)
Total Protein: 7.1 g/dL (ref 6.1–8.1)
eGFR: 52 mL/min/{1.73_m2} — ABNORMAL LOW (ref >=60)

## 2021-04-12 LAB — CBC WITH DIFFERENTIAL/PLATELET
Absolute Monocytes: 593 cells/uL (ref 200–950)
Basophils Absolute: 84 cells/uL (ref 0–200)
Basophils Relative: 1.1 %
Eosinophils Absolute: 91 cells/uL (ref 15–500)
Eosinophils Relative: 1.2 %
HCT: 40.4 % (ref 35.0–45.0)
Hemoglobin: 13.3 g/dL (ref 11.7–15.5)
Lymphs Abs: 836 cells/uL — ABNORMAL LOW (ref 850–3900)
MCH: 29.8 pg (ref 27.0–33.0)
MCHC: 32.9 g/dL (ref 32.0–36.0)
MCV: 90.6 fL (ref 80.0–100.0)
MPV: 9.6 fL (ref 7.5–12.5)
Monocytes Relative: 7.8 %
Neutro Abs: 5996 cells/uL (ref 1500–7800)
Neutrophils Relative %: 78.9 %
Platelets: 498 10*3/uL — ABNORMAL HIGH (ref 140–400)
RBC: 4.46 10*6/uL (ref 3.80–5.10)
RDW: 14.1 % (ref 11.0–15.0)
Total Lymphocyte: 11 %
WBC: 7.6 10*3/uL (ref 3.8–10.8)

## 2021-04-12 LAB — URINALYSIS, ROUTINE W REFLEX MICROSCOPIC
Bilirubin Urine: NEGATIVE
Glucose, UA: NEGATIVE
Hyaline Cast: NONE SEEN /LPF
Nitrite: NEGATIVE
Specific Gravity, Urine: 1.02 (ref 1.001–1.035)
pH: 6 (ref 5.0–8.0)

## 2021-04-12 LAB — LIPID PANEL
Cholesterol: 129 mg/dL (ref <200)
HDL: 66 mg/dL (ref >=50)
LDL Cholesterol (Calc): 50 mg/dL (calc)
Non-HDL Cholesterol (Calc): 63 mg/dL (calc) (ref <130)
Total CHOL/HDL Ratio: 2 (calc) (ref <5.0)
Triglycerides: 57 mg/dL (ref <150)

## 2021-04-12 LAB — TSH: TSH: 2.54 mIU/L (ref 0.40–4.50)

## 2021-04-12 LAB — PTH, INTACT AND CALCIUM
Calcium: 10.5 mg/dL — ABNORMAL HIGH (ref 8.6–10.4)
PTH: 74 pg/mL (ref 16–77)

## 2021-04-12 LAB — HEMOGLOBIN A1C
Hgb A1c MFr Bld: 5.5 % of total Hgb (ref <5.7)
Mean Plasma Glucose: 111 mg/dL
eAG (mmol/L): 6.2 mmol/L

## 2021-04-12 LAB — INSULIN, RANDOM: Insulin: 4.1 u[IU]/mL

## 2021-04-12 LAB — MICROALBUMIN / CREATININE URINE RATIO
Creatinine, Urine: 177 mg/dL (ref 20–275)
Microalb Creat Ratio: 115 mcg/mg creat — ABNORMAL HIGH (ref <30)
Microalb, Ur: 20.4 mg/dL

## 2021-04-12 LAB — VITAMIN D 25 HYDROXY (VIT D DEFICIENCY, FRACTURES): Vit D, 25-Hydroxy: 41 ng/mL (ref 30–100)

## 2021-04-12 LAB — MAGNESIUM: Magnesium: 2.4 mg/dL (ref 1.5–2.5)

## 2021-04-12 MED ORDER — NITROFURANTOIN MONOHYD MACRO 100 MG PO CAPS: ORAL_CAPSULE | ORAL | 0 refills | Status: DC

## 2021-04-12 NOTE — Progress Notes (Signed)
============================================================ ============================================================  -    U/A shows UTI, So Sent Rx for Antibiotic to [facility] &   - Needs to schedule NV in 1 month for U/A & C/S ============================================================ ============================================================  -  PTH - Hormone that regulates calcium balance is Normal  ============================================================ ============================================================  -  Total Chol = 129   &  LDL Chol = 50    - Both   Excellent   - Very low risk for Heart Attack  / Stroke ============================================================ ============================================================  -  A1c - Normal - No Diabetes - Great !  ============================================================ ============================================================  -  Vitamin D = 41 - Very Low - currently on 3,000 units/day   - Recommend increase to 5,000 units /day  ============================================================ ============================================================  -  All Else - CBC - Kidneys - Electrolytes - Liver - Magnesium & Thyroid    - all  Normal / OK ============================================================ ============================================================

## 2021-05-11 ENCOUNTER — Telehealth: Payer: Self-pay

## 2021-05-11 ENCOUNTER — Other Ambulatory Visit: Payer: Self-pay | Admitting: Adult Health

## 2021-05-11 MED ORDER — LORAZEPAM 0.5 MG PO TABS: ORAL_TABLET | ORAL | 0 refills | Status: DC

## 2021-05-11 NOTE — Telephone Encounter (Signed)
Refill request for Lorazepam.  

## 2021-05-11 NOTE — Progress Notes (Signed)
Future Appointments  Date Time Provider Department Center  05/17/2021 11:00 AM Judd Gaudier, NP [facility] None  08/16/2021  8:45 AM Ihor Austin, NP [facility] None  10/09/2021 11:00 AM Revonda Humphrey, NP [facility] None  04/11/2022  3:00 PM Lucky Cowboy, MD [facility] None    PDMP reviewed for lorazepam refill request.

## 2021-05-16 NOTE — Progress Notes (Signed)
MEDICARE ANNUAL WELLNESS VISIT AND FOLLOW UP  Assessment:    Diagnoses and all orders for this visit:  Encounter for Medicare annual wellness exam Due annually  Health maintenance reviewed  Declines colon cancer screening, mammogram, DEXA after discussion of risks and benefits  Prefers minimal interventions  LBBB (left bundle branch block) Avoid rate controlling drugs, monitor, annual EKG  CHF ([place]) Appears euvolemic Low sodium diet, continue regular exercise Cardiology following  Emphysema ([employer]) Denies sx; remote smoker; monitor  Labile hypertension Currently off of medications and at goal Monitor blood pressure at home; call if consistently over 140/90 Continue DASH diet.   Reminder to go to the ER if any CP, SOB, nausea, dizziness, severe HA, changes vision/speech, left arm numbness and tingling and jaw pain.  CKD IIIa (HCC) Increase fluids, avoid NSAIDS, monitor sugars, will monitor - CMP/GFR  Vitamin D deficiency At goal at recent check; continue to recommend supplementation for goal of 60-100 Defer vitamin D level  Hx of Prediabetes Discussed disease and risks Discussed diet/exercise, weight management  A1C annually; monitor CMP/weight  Medication management CBC, CMP/GFR  Hyperlipidemia No longer treated secondary to age; check annually at [place] at night as needed for sleep; anxiety recently well controlled Continue lifestyle efforts  BMI 19 Reviewed diet; continue 3 meals/day, add daily boost or ensure in between meals, add 1-2 tablespoons of nut butter with snack Start monitoring weekly weights (son will assist with monitoring); contact office if continues to trend down and will consider adding remeron Set goal to remain up 110+ lb  DNR  Unable to locate copy in chart, requested with most UTD advanced directives/living will   Need for influenza vaccine High dose quadrivalent administered without  complication today    Over 40 minutes of exam, counseling, chart review and critical decision making was performed Future Appointments  Date Time Provider [place]  08/16/2021  8:45 AM Frann Rider, NP [facility] None  10/09/2021 11:00 AM Magda Bernheim, NP [facility] None  04/11/2022  3:00 PM Unk Pinto, MD [facility] None  05/17/2022 11:00 AM Liane Comber, NP [facility] None     Plan:   During the course of the visit the patient was educated and counseled about appropriate screening and preventive services including:   Pneumococcal vaccine  Prevnar 13 Influenza vaccine Td vaccine Screening electrocardiogram Bone densitometry screening Colorectal cancer screening Diabetes screening Glaucoma screening Nutrition counseling  Advanced directives: requested   Subjective:  Leslie [name] is a 87 y.o. female who presents for Medicare Annual Wellness Visit and 3 month follow up. She has LBBB (left bundle branch block); Vitamin D deficiency; Hyperlipidemia, mixed; Medication management; Anxiety; Labile hypertension; FHx: heart disease; Former smoker; Abnormal glucose; Body mass index (BMI) of 19.0-19.9 in adult; CKD (chronic kidney disease) stage 3, GFR 30-59 ml/min ([place]); History of embolic stroke; DNR no code (do not resuscitate); Thrombocytosis; Chronic combined systolic and diastolic CHF (congestive heart failure) ([place]); Chronic low back pain without sciatica; Slow transit constipation; Aortic atherosclerosis ([place]) - CT 09/2020; and Pulmonary emphysema ([place]) - per CT 09/2020 on their problem list.  She completed course of macrobid several weeks back for UTI; denies any persistent or recurrent urinary or vaginal sx.   She was admitted 123456 cerebral embolic CVA (without residual deficits other than mild short term memory loss after inpatient therapy, son and patient note this seems resolved), initially on DAPT transitioned to elequis 2.5 mg BID due to depressed EF -  2D  echo  showed severely depressed LVEF with akinesis of the septum, inferior and apical walls, hypokinesis elsewhere.  LVEF is 25%, normal RV. Plan to continue NOAC until EF 35%+. Has followed up with cardiology; She had follow up ECHO 12/2020 showing EF remained 20-25%.   She is accompanied by her son today, back to driving and staying by herself, close check ins from her sons. Denies residual sx from recent CVA following therapy.   She is currently prescribed ativan 0.5 mg for anxiety (reduced from previous 1 mg); she reports she takes daily at night for sleep, taking 1/2 tab (0.25 mg). Hasn't needed during the day. 30 tabs typically last 60 days. Discussed reducing to PRN use, receptive to this.   BMI is Body mass index is 19.02 kg/m., she has been working on diet and exercise. Walks 5 days a week. Reports eats 3 meals, finishes plate. Has ensure but not taking.  Wt Readings from Last 3 Encounters:  05/17/21 104 lb (47.2 kg)  04/11/21 107 lb (48.5 kg)  02/09/21 107 lb (48.5 kg)    Her blood pressure has been controlled at home (100s-140/80s), today their BP is BP: 110/72 She does workout. She denies chest pain, shortness of breath, dizziness.   She has aortic atherosclerosis per CT 09/2020, emphysema per imaging , remote former smoker quit in the 100s and denies sx, monitoring only.   She is on cholesterol medication (atorvastatin 20 mg daily) and denies myalgias. Her cholesterol is at goal. The cholesterol last visit was:   Lab Results  Component Value Date   CHOL 129 04/11/2021   HDL 66 04/11/2021   LDLCALC 50 04/11/2021   TRIG 57 04/11/2021   CHOLHDL 2.0 04/11/2021    She has been working on diet and exercise for hx prediabetes (rarely eats bread, does have a sweet tooth), and denies foot ulcerations, increased appetite, nausea, paresthesia of the feet, polydipsia, polyuria, visual disturbances, vomiting and weight loss. Last A1C in the office was:  Lab Results  Component Value Date   HGBA1C  5.5 04/11/2021   She does push water intake, at minimum 5 glasses of ? 16 oz. Last GFR: Lab Results  Component Value Date   GFRNONAA 53 (L) 01/08/2021   GFRNONAA 73 10/12/2020   GFRNONAA >60 10/09/2020   She has mild elevated calcium, normal PTH recently Lab Results  Component Value Date   PTH 74 04/11/2021   CALCIUM 10.5 (H) 04/11/2021   CALCIUM 10.5 (H) 04/11/2021   CAION 1.32 10/01/2020   Patient is on Vitamin D supplement,  taking ? 3000 IU and WNL at recent check:    Lab Results  Component Value Date   VD25OH 41 04/11/2021        Medication Review: Current Outpatient Medications on File Prior to Visit  Medication Sig Dispense Refill   apixaban (ELIQUIS) 2.5 MG TABS tablet Take 1 tablet (2.5 mg total) by mouth 2 (two) times daily. 60 tablet 2   atorvastatin (LIPITOR) 20 MG tablet Take 1 tablet (20 mg total) by mouth daily. 90 tablet 3   CHOLECALCIFEROL PO Take 3,000 Units by mouth daily.     LORazepam (ATIVAN) 0.5 MG tablet TAKE 1/2-1 TABLET BY MOUTH AT BEDTIME ONLY IF NEEDED FOR SLEEP AND PLEASE TRY TO LIMIT TO 5 DAYS A WEEK TO AVOID ADDICTION AND DEMENTIA 30 tablet 0   metoprolol succinate (TOPROL-XL) 25 MG 24 hr tablet Take  1 tablet  Daily  for Heart & BP 90 tablet 3  nitrofurantoin, macrocrystal-monohydrate, (MACROBID) 100 MG capsule Take  1 capsule  2 x /day with Food for 1 week  for UTI 14 capsule 0   No current facility-administered medications on file prior to visit.    Allergies  Allergen Reactions   Adhesive [Tape]    Advil [Ibuprofen]    Atenolol     Current Problems (verified) Patient Active Problem List   Diagnosis Date Noted   Aortic atherosclerosis ([place]) - CT 09/2020 10/11/2020   Pulmonary emphysema (HCC) - per CT 09/2020 10/11/2020   Slow transit constipation    Chronic combined systolic and diastolic CHF (congestive heart failure) ([place])    Chronic low back pain without sciatica    History of embolic stroke [id number]   DNR no code (do not  resuscitate) 10/01/2020   Thrombocytosis 10/01/2020   Body mass index (BMI) of 19.0-19.9 in adult 05/15/2020   CKD (chronic kidney disease) stage 3, GFR 30-59 ml/min ([employer]) 05/15/2020   Labile hypertension 06/16/2018   FHx: heart disease 06/16/2018   Former smoker 06/16/2018   Abnormal glucose 06/16/2018   Anxiety 03/04/2018   Medication management 04/08/2014   Vitamin D deficiency 07/01/2013   Hyperlipidemia, mixed 07/01/2013   LBBB (left bundle branch block) 02/20/2010    Screening Tests Immunization History  Administered Date(s) Administered   DTaP 05/06/2003   Influenza, High Dose Seasonal PF 05/17/2021   PFIZER(Purple Top)SARS-COV-2 Vaccination 09/22/2019, 10/13/2019   Pneumococcal Polysaccharide-23 05/06/2003   Zoster, Live 07/16/2007   Preventative care: Last colonoscopy: never, declines any screening  Last mammogram: 2012, declines further mammograms DEXA: declines Last CXR: 2013, former smoker quit in 1981, declines repeat CXR today, denies concerning sx  Prior vaccinations: TD or Tdap: 2004, declines futher due to cost  Influenza: TODAY  Pneumococcal: 2004 Prevnar13: Declines Shingles/Zostavax: 2009 Covid 19: 2/2, 2021, pfizer   Names of Other Physician/Practitioners you currently use: 1. [facility]- here for primary care 2. Dr. Prudencio Burly, eye doctor, last visit 2022, sees q58m, glaucoma well controlled with drops 3. Dr.  Aggie Moats, dentist, last visit 2021- goes annually, plans to follow up  Patient Care Team: Unk Pinto, MD as PCP - General (Internal Medicine) Jerline Pain, MD as PCP - Cardiology (Cardiology)  SURGICAL HISTORY She  has a past surgical history that includes Glaucoma surgery (2013) and Tonsillectomy. FAMILY HISTORY Her family history includes Diabetes in her father; Heart attack in her brother, brother, and father; Heart disease in her brother and brother; Hypertension in her mother; Stroke in her  mother. SOCIAL HISTORY She  reports that she quit smoking about 41 years ago. Her smoking use included cigarettes. She has never used smokeless tobacco. She reports that she does not drink alcohol and does not use drugs.   MEDICARE WELLNESS OBJECTIVES: Physical activity: Current Exercise Habits: Home exercise routine, Type of exercise: walking, Time (Minutes): 30, Frequency (Times/Week): 5, Weekly Exercise (Minutes/Week): 150, Intensity: Mild, Exercise limited by: None identified Cardiac risk factors: Cardiac Risk Factors include: advanced age (>21men, >9 women);dyslipidemia;hypertension Depression/mood screen:   Depression screen [facility] 2/9 05/17/2021  Decreased Interest 0  Down, Depressed, Hopeless 0  PHQ - 2 Score 0  Altered sleeping -  Tired, decreased energy -  Change in appetite -  Feeling bad or failure about yourself  -  Trouble concentrating -  Moving slowly or fidgety/restless -  Suicidal thoughts -  PHQ-9 Score -    ADLs:  In your present state of health, do you have any difficulty  performing the following activities: 05/17/2021 04/11/2021  Hearing? N N  Vision? N N  Difficulty concentrating or making decisions? N N  Comment improved since stroke -  Walking or climbing stairs? N N  Dressing or bathing? N N  Doing errands, shopping? Y N  Comment son drives long distances, able to drive short distances to grocery -  Conservation officer, nature and eating ? N -  Using the Toilet? N -  In the past six months, have you accidently leaked urine? N -  Do you have problems with loss of bowel control? N -  Managing your Medications? N -  Managing your Finances? N -  Housekeeping or managing your Housekeeping? N -  Some recent data might be hidden     Cognitive Testing  Alert? Yes  Normal Appearance?Yes  Oriented to person? Yes  Place? Yes   Time? Yes  Recall of three objects?  Yes  Can perform simple calculations? Yes  Displays appropriate judgment?Yes  Can read the correct time from  a watch face?Yes  EOL planning: Does Patient Have a Medical Advance Directive?: Yes Type of Advance Directive: Healthcare Power of Attorney, Living will Does patient want to make changes to medical advance directive?: No - Patient declined Copy of [place] in Chart?: No - copy requested  Review of Systems  Constitutional:  Negative for malaise/fatigue and weight loss.  HENT:  Negative for hearing loss and tinnitus.   Eyes:  Negative for blurred vision and double vision.  Respiratory:  Negative for cough, sputum production, shortness of breath and wheezing.   Cardiovascular:  Negative for chest pain, palpitations, orthopnea, claudication, leg swelling and PND.  Gastrointestinal:  Negative for abdominal pain, blood in stool, constipation, diarrhea, heartburn, melena, nausea and vomiting.  Genitourinary: Negative.   Musculoskeletal:  Positive for back pain. Negative for falls, joint pain and myalgias.  Skin:  Negative for rash.  Neurological:  Negative for dizziness, tingling, sensory change, weakness and headaches.  Endo/Heme/Allergies:  Negative for polydipsia.  Psychiatric/Behavioral: Negative.  Negative for depression, memory loss, substance abuse and suicidal ideas. The patient is not nervous/anxious and does not have insomnia.   All other systems reviewed and are negative.   Objective:     Today's Vitals   05/17/21 1046  BP: 110/72  Pulse: 90  Temp: 97.7 F (36.5 C)  SpO2: 99%  Weight: 104 lb (47.2 kg)   Body mass index is 19.02 kg/m.  General appearance: alert, no distress, WD/WN, thin elder female HEENT: normocephalic, sclerae anicteric, TMs pearly, nares patent, no discharge or erythema, pharynx normal Oral cavity: MMM, no lesions Neck: supple, no lymphadenopathy, no thyromegaly, no masses Heart: RRR, normal S1, S2, no murmurs Lungs: CTA bilaterally, no wheezes, rhonchi, or rales Abdomen: +bs, soft, non tender, non distended, no masses, no  hepatomegaly, no splenomegaly Musculoskeletal: nontender, no swelling, no obvious deformity. Able to bend down to touch toes and get up without assistance.  Extremities: no edema, no cyanosis, no clubbing Pulses: 2+ symmetric, upper and lower extremities, normal cap refill Neurological: alert, oriented x 3, CN2-12 intact, strength normal upper extremities and lower extremities, sensation normal throughout, DTRs 2+ throughout, no cerebellar signs, gait normal Psychiatric: normal affect, behavior normal, pleasant   Medicare Attestation I have personally reviewed: The patient's medical and social history Their use of alcohol, tobacco or illicit drugs Their current medications and supplements The patient's functional ability including ADLs,fall risks, home safety risks, cognitive, and hearing and visual impairment Diet and  physical activities Evidence for depression or mood disorders  The patient's weight, height, BMI, and visual acuity have been recorded in the chart.  I have made referrals, counseling, and provided education to the patient based on review of the above and I have provided the patient with a written personalized care plan for preventive services.     Dan Maker, NP   05/17/2021

## 2021-05-17 ENCOUNTER — Other Ambulatory Visit: Payer: Self-pay | Admitting: Internal Medicine

## 2021-05-17 ENCOUNTER — Encounter: Payer: Self-pay | Admitting: Adult Health

## 2021-05-17 ENCOUNTER — Other Ambulatory Visit: Payer: Self-pay

## 2021-05-17 ENCOUNTER — Ambulatory Visit (INDEPENDENT_AMBULATORY_CARE_PROVIDER_SITE_OTHER): Payer: Medicare Other | Admitting: Adult Health

## 2021-05-17 VITALS — BP 110/72 | HR 90 | Temp 97.7°F | Wt 104.0 lb

## 2021-05-17 DIAGNOSIS — N1831 Chronic kidney disease, stage 3a: Secondary | ICD-10-CM

## 2021-05-17 DIAGNOSIS — Z8673 Personal history of transient ischemic attack (TIA), and cerebral infarction without residual deficits: Secondary | ICD-10-CM | POA: Diagnosis not present

## 2021-05-17 DIAGNOSIS — M545 Low back pain, unspecified: Secondary | ICD-10-CM | POA: Diagnosis not present

## 2021-05-17 DIAGNOSIS — K5901 Slow transit constipation: Secondary | ICD-10-CM | POA: Diagnosis not present

## 2021-05-17 DIAGNOSIS — Z79899 Other long term (current) drug therapy: Secondary | ICD-10-CM

## 2021-05-17 DIAGNOSIS — Z23 Encounter for immunization: Secondary | ICD-10-CM | POA: Diagnosis not present

## 2021-05-17 DIAGNOSIS — R6889 Other general symptoms and signs: Secondary | ICD-10-CM | POA: Diagnosis not present

## 2021-05-17 DIAGNOSIS — I5042 Chronic combined systolic (congestive) and diastolic (congestive) heart failure: Secondary | ICD-10-CM

## 2021-05-17 DIAGNOSIS — G8929 Other chronic pain: Secondary | ICD-10-CM

## 2021-05-17 DIAGNOSIS — Z0001 Encounter for general adult medical examination with abnormal findings: Secondary | ICD-10-CM

## 2021-05-17 DIAGNOSIS — I447 Left bundle-branch block, unspecified: Secondary | ICD-10-CM

## 2021-05-17 DIAGNOSIS — R7309 Other abnormal glucose: Secondary | ICD-10-CM

## 2021-05-17 DIAGNOSIS — E559 Vitamin D deficiency, unspecified: Secondary | ICD-10-CM

## 2021-05-17 DIAGNOSIS — I700 Atherosclerosis of aorta: Secondary | ICD-10-CM

## 2021-05-17 DIAGNOSIS — Z0000 Encounter for general adult medical examination without abnormal findings: Secondary | ICD-10-CM

## 2021-05-17 DIAGNOSIS — Z681 Body mass index (BMI) 19 or less, adult: Secondary | ICD-10-CM

## 2021-05-17 DIAGNOSIS — E782 Mixed hyperlipidemia: Secondary | ICD-10-CM

## 2021-05-17 DIAGNOSIS — J439 Emphysema, unspecified: Secondary | ICD-10-CM | POA: Diagnosis not present

## 2021-05-17 DIAGNOSIS — R0989 Other specified symptoms and signs involving the circulatory and respiratory systems: Secondary | ICD-10-CM | POA: Diagnosis not present

## 2021-05-17 DIAGNOSIS — F419 Anxiety disorder, unspecified: Secondary | ICD-10-CM

## 2021-05-17 MED ORDER — VITAMIN D3 25 MCG (1000 UT) PO CAPS: ORAL_CAPSULE | ORAL | 0 refills | Status: AC

## 2021-05-17 NOTE — Patient Instructions (Signed)
Please double check vitamin D dose  Recommend daily ensure/boost daily   Add extra peanut butter   Weigh once a week and keep a log - call office if trending down      High-Protein and High-Calorie Diet Eating high-protein and high-calorie foods can help you to gain weight, heal after an injury, and recover after an illness or surgery. The specific amount of daily protein and calories you need depends on: Your body weight. The reason this diet is recommended for you. Generally, a high-protein, high-calorie diet involves: Eating 250-500 extra calories each day. Making sure that you get enough of your daily calories from protein. Ask your health care provider how many of your calories should come from protein. Talk with a health care provider or a dietitian about how much protein and how many calories you need each day. Follow the diet as directed by your health care provider. What are tips for following this plan? Reading food labels Check the nutrition facts label for calories, grams of fat and protein. Items with more than 4 grams of protein are high-protein foods. Preparing meals Add whole milk, half-and-half, or heavy cream to cereal, pudding, soup, or hot cocoa. Add whole milk to instant breakfast drinks. Add peanut butter to oatmeal or smoothies. Add powdered milk to baked goods, smoothies, or milkshakes. Add powdered milk, cream, or butter to mashed potatoes. Add cheese to cooked vegetables. Make whole-milk yogurt parfaits. Top them with granola, fruit, or nuts. Add cottage cheese to fruit. Add avocado, cheese, or both to sandwiches or salads. Add avocado to smoothies. Add meat, poultry, or seafood to rice, pasta, casseroles, salads, and soups. Use mayonnaise when making egg salad, chicken salad, or tuna salad. Use peanut butter as a dip for fruits and vegetables or as a topping for pretzels, celery, or crackers. Add beans to casseroles, dips, and spreads. Add pureed beans  to sauces and soups. Replace calorie-free drinks with calorie-containing drinks, such as milk and fruit juice. Replace water with milk or heavy cream when making foods such as oatmeal, pudding, or cocoa. Add oil or butter to cooked vegetables and grains. Add cream cheese to sandwiches or as a topping on crackers and bread. Make cream-based pastas and soups. General information Ask your health care provider if you should take a nutritional supplement. Try to eat six small meals each day instead of three large meals. A general goal is to eat every 2 to 3 hours. Eat a balanced diet. In each meal, include one food that is high in protein and one food with fat in it. Keep nutritious snacks available, such as nuts, trail mixes, dried fruit, and yogurt. If you have kidney disease or diabetes, talk with your health care provider about how much protein is safe for you. Too much protein may put extra stress on your kidneys. Drink your calories. Choose high-calorie drinks and have them after your meals. Consider setting a timer to remind you to eat. You will want to eat even if you do not feel very hungry. What high-protein foods should I eat? Vegetables Soybeans. Peas. Grains Quinoa. Bulgur wheat. Buckwheat. Meats and other proteins Beef, pork, and poultry. Fish and seafood. Eggs. Tofu. Textured vegetable protein (TVP). Peanut butter. Nuts and seeds. Dried beans. Protein powders. Hummus. Dairy Whole milk. Whole-milk yogurt. Powdered milk. Cheese. [employer]. Eggnog. Beverages High-protein supplement drinks. Soy milk. Other foods Protein bars. The items listed above may not be a complete list of foods and beverages you can eat  and drink. Contact a dietitian for more information. What high-calorie foods should I eat? Fruits Dried fruit. Fruit leather. Canned fruit in syrup. Fruit juice. Avocado. Vegetables Vegetables cooked in oil or butter. Fried potatoes. Grains Pasta. Quick breads.  Muffins. Pancakes. Ready-to-eat cereal. Meats and other proteins Peanut butter. Nuts and seeds. Dairy Heavy cream. Whipped cream. Cream cheese. Sour cream. Ice cream. Custard. Pudding. Whole milk dairy products. Beverages Meal-replacement beverages. Nutrition shakes. Fruit juice. Seasonings and condiments Salad dressing. Mayonnaise. Alfredo sauce. Fruit preserves or jelly. Honey. Syrup. Sweets and desserts Cake. Cookies. Pie. Pastries. Candy bars. Chocolate. Fats and oils Butter or margarine. Oil. Gravy. Other foods Meal-replacement bars. The items listed above may not be a complete list of foods and beverages you can eat and drink. Contact a dietitian for more information. Summary A high-protein, high-calorie diet can help you gain weight or heal faster after an injury, illness, or surgery. To increase your protein and calories, add ingredients such as whole milk, peanut butter, cheese, beans, meat, or seafood to meal items. To get enough extra calories each day, include high-calorie foods and beverages at each meal. Adding a high-calorie drink or shake can be an easy way to help you get enough calories each day. Talk with your healthcare provider or dietitian about the best options for you. This information is not intended to replace advice given to you by your health care provider. Make sure you discuss any questions you have with your health care provider. Document Revised: 06/04/2020 Document Reviewed: 06/04/2020 Elsevier Patient Education  2022 [employer].

## 2021-05-18 LAB — COMPLETE METABOLIC PANEL WITH GFR
AG Ratio: 1.5 (calc) (ref 1.0–2.5)
ALT: 13 U/L (ref 6–29)
AST: 16 U/L (ref 10–35)
Albumin: 4 g/dL (ref 3.6–5.1)
Alkaline phosphatase (APISO): 47 U/L (ref 37–153)
BUN/Creatinine Ratio: 28 (calc) — ABNORMAL HIGH (ref 6–22)
BUN: 28 mg/dL — ABNORMAL HIGH (ref 7–25)
CO2: 27 mmol/L (ref 20–32)
Calcium: 10.3 mg/dL (ref 8.6–10.4)
Chloride: 103 mmol/L (ref 98–110)
Creat: 1.01 mg/dL — ABNORMAL HIGH (ref 0.60–0.95)
Globulin: 2.7 g/dL (calc) (ref 1.9–3.7)
Glucose, Bld: 107 mg/dL — ABNORMAL HIGH (ref 65–99)
Potassium: 4.6 mmol/L (ref 3.5–5.3)
Sodium: 138 mmol/L (ref 135–146)
Total Bilirubin: 0.6 mg/dL (ref 0.2–1.2)
Total Protein: 6.7 g/dL (ref 6.1–8.1)
eGFR: 52 mL/min/{1.73_m2} — ABNORMAL LOW (ref >=60)

## 2021-06-05 ENCOUNTER — Telehealth: Payer: Self-pay | Admitting: Internal Medicine

## 2021-06-05 NOTE — Chronic Care Management (AMB) (Signed)
  Chronic Care Management   Outreach Note  06/05/2021 Name: Leslie [name] MRN: [id number] DOB: 1926/08/25  Referred by: Lucky Cowboy, MD Reason for referral : No chief complaint on file.   An unsuccessful telephone outreach was attempted today. The patient was referred to the pharmacist for assistance with care management and care coordination.   Follow Up Plan:   Tatjana Dellinger Up[employer] Scheduler

## 2021-06-11 ENCOUNTER — Telehealth: Payer: Self-pay | Admitting: Internal Medicine

## 2021-06-11 NOTE — Chronic Care Management (AMB) (Signed)
  Chronic Care Management   Note  06/11/2021 Name: CARLISA EBLE MRN: [id number] DOB: 1926/09/20  JONEL SICK is a 87 y.o. year old female who is a primary care patient of Lucky Cowboy, MD. I reached out to [name] by phone today in response to a referral sent by Ms. Melbourne Abts Reale's PCP, Lucky Cowboy, MD.   Ms. Edinger was given information about Chronic Care Management services today including:  CCM service includes personalized support from designated clinical staff supervised by her physician, including individualized plan of care and coordination with other care providers 24/7 contact phone numbers for assistance for urgent and routine care needs. Service will only be billed when office clinical staff spend 20 minutes or more in a month to coordinate care. Only one practitioner may furnish and bill the service in a calendar month. The patient may stop CCM services at any time (effective at the end of the month) by phone call to the office staff.   Patient agreed to services and verbal consent obtained.   Follow up plan:   Tatjana Restaurant manager, fast food

## 2021-07-17 ENCOUNTER — Telehealth: Payer: Self-pay | Admitting: Adult Health

## 2021-07-17 ENCOUNTER — Other Ambulatory Visit: Payer: Self-pay

## 2021-07-17 DIAGNOSIS — I639 Cerebral infarction, unspecified: Secondary | ICD-10-CM

## 2021-07-17 MED ORDER — ATORVASTATIN CALCIUM 20 MG PO TABS
20.0000 mg | ORAL_TABLET | Freq: Every day | ORAL | 3 refills | Status: DC
Start: 2021-07-17 — End: 2022-06-10

## 2021-07-17 MED ORDER — METOPROLOL SUCCINATE ER 25 MG PO TB24: ORAL_TABLET | ORAL | 3 refills | Status: DC

## 2021-07-17 MED ORDER — APIXABAN 2.5 MG PO TABS: 2.5000 mg | ORAL_TABLET | Freq: Two times a day (BID) | ORAL | 1 refills | Status: DC

## 2021-07-17 NOTE — Telephone Encounter (Signed)
Called son, on [place] and informed him the pharmacy change can be made today. He stated they are switching to prevent patient from driving to [facility]. He stated she has enough medication to last a week until Rx arrives. He requested a 90 day supply; I advised will send in. He verbalized understanding, appreciation. Eliquis and atorvastatin discontinued at [facility], [facility]

## 2021-07-17 NOTE — Telephone Encounter (Signed)
Pt;son, [employer] (on [place]) permanently changing pharmacy for apixaban (ELIQUIS) 2.5 MG TABS tablet andatorvastatin (LIPITOR) 20 MG tablet  to: [facility]  Would like a call from the nurse to discuss when prescription will be sent to [facility]

## 2021-07-19 ENCOUNTER — Telehealth: Payer: Medicare Other | Admitting: Pharmacist

## 2021-07-27 DIAGNOSIS — H401431 Capsular glaucoma with pseudoexfoliation of lens, bilateral, mild stage: Secondary | ICD-10-CM | POA: Diagnosis not present

## 2021-08-16 ENCOUNTER — Ambulatory Visit (INDEPENDENT_AMBULATORY_CARE_PROVIDER_SITE_OTHER): Payer: Medicare Other | Admitting: Adult Health

## 2021-08-16 ENCOUNTER — Encounter: Payer: Self-pay | Admitting: Adult Health

## 2021-08-16 VITALS — BP 137/86 | HR 91 | Ht 62.0 in | Wt 106.0 lb

## 2021-08-16 DIAGNOSIS — I639 Cerebral infarction, unspecified: Secondary | ICD-10-CM

## 2021-08-16 NOTE — Progress Notes (Signed)
[facility] [address] [place]. [place] [address] [phone]       STROKE FOLLOW UP NOTE  [name] Date of Birth:  Apr 13, 1927 Medical Record Number:  [id number]   Reason for Referral: stroke follow up    SUBJECTIVE:   CHIEF COMPLAINT:  Chief Complaint  Patient presents with   Follow-up    RM 2 with son wayne Pt is well and stable, no new concerns      HPI:   Update 08/16/2021 JM: Returns for 85-month stroke follow-up accompanied by her son, [place].  Overall stable without new or reoccurring stroke/TIA symptoms.  Continues to live alone and maintains ADLs and IADLs independently with family assisting as needed and routinely checking on her. She also participates in seniors helping seniors 3 days per week. Compliant on Eliquis and atorvastatin without side effects.  Blood pressure today 137/86.  Is overdue for follow-up with Dr. Marlou Porch.  Routinely followed by PCP.  No new concerns at this time.    History provided for reference purposes only Update 02/08/2021 JM: [name] returns for 71-month stroke follow-up accompanied by her son, Dayle.  Stable since prior visit without new stroke/TIA symptoms. Denies any residual stroke deficits - son believes she is doing better than she was before. She does walk around her yard/driveway for 30 min per day without difficulty. Denies any recent falls.  Continues to maintain ADLs and IADLs independently without difficulty.  Family checks on her routinely.  She has since returned back to driving short distance during the day time only - denies any difficulty.  Reports compliance on Eliquis and atorvastatin without associated side effects.  Blood pressure today 136/87. Blood pressure stable at home. Had follow-up visit with cardiologist Dr. Marlou Porch with repeat 2D echo showing continued EF 20 to 25%. No concerns today.   Initial visit 11/08/2020 JM: Ms. [name] is being seen for hospital follow-up accompanied by her son  She  was discharged home from [facility] on 10/10/2020 with recommended outpatient therapies.  Currently working with outpatient therapy for residual mild cognitive impairment with short-term memory loss and mild LLE weakness but overall greatly improving.  Ambulates without assistive device and denies any recent falls.  She continues to live independently with family checking in on her routinely maintaining ADLs and IADLs independently without difficulty.  She questions return to driving as she was driving previously. Denies new stroke/TIA symptoms  Reports compliance on Eliquis 2.5 mg twice daily with mild bruising but no bleeding as well as atorvastatin 20 mg daily without associated side effects.  Blood pressure today 142/92.  Routinely monitors at home and typically 110-120/90s.  Has scheduled follow-up with cardiology 5/18 She was seen by her PCP on 3/31  Son is requesting medication refills that she was discharged on from hospital  No further concerns at this time  Stroke admission 10/01/2020 [name] is a 88 y.o. female with history of HTN, HLD, pre DMII, glaucoma, and Vit D deficiency who presented on 10/01/2020 with slurred speech with a left facial droop.  Personally reviewed hospitalization pertinent progress notes, lab work and imaging summary provided.  Stroke work-up revealed small left cerebellar and several punctate right frontal parietal cortex infarcts, etiology likely due to cardiomyopathy with low EF although occult A. Fib also possibility.  Recommended initiating Eliquis 2.5 mg twice daily in setting of cardiomyopathy with a EF of 25% and recommended if EF improves after 3 months can switch back to antiplatelet therapy.  Advised to follow-up with cardiology outpatient.  HTN stable.  LDL 78 and initiate atorvastatin 20 mg daily.  Other stroke risk factors include advanced age.  Also treated for UTI with culture positive for E. Coli.  Evaluated by therapies who recommended discharge to CIR  for ongoing therapy needs.  Stroke: Small left cerebellum and several punctate right frontoparietal vertex infarcts, etiology likely due to cardiomyopathy with low EF.  Occult A. fib also possible. Code Stroke CT head No acute abnormality. Atrophy and chronic small-vessel ischemic changes. ASPECTS 10.  CTA head & neck Aortic atherosclerosis and ectasia.  No intracranial large vessel occlusion. Occlusion of the innominate vein MRI  Several small/punctate acute infarctions at the right frontoparietal vertex. 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 LE venous dopplers: negative for DVT 2D Echo EF 25%, severely depressed LVEF with akinesis of the septum, inferior and apical walls, hypokinesis elsewhere.  LDL 78 HgbA1c 5.8 VTE prophylaxis - Lovenox Diet: heart healthy No antithrombotic prior to admission, now on aspirin 81 mg daily and clopidogrel 75 mg daily.  Will start Eliquis 2.5mg  bid given her cardiomyopathy and recent stroke and s/p ASA and plavix.  If her EF improves after 3 months, switch back to antiplatelet therapy.   Therapy recommendations:  CIR Disposition:  d/c to CIR on 10/03/2020     ROS:   14 system review of systems performed and negative with exception of those listed in HPI  PMH:  Past Medical History:  Diagnosis Date   Anxiety disorder    Cardiomyopathy ([place])    Glaucoma    Hearing loss    HTN (hypertension)    Hyperglycemia    Hyperlipidemia    LBBB (left bundle branch block)    Stroke (cerebrum) (HCC)    Vitamin D deficiency     PSH:  Past Surgical History:  Procedure Laterality Date   GLAUCOMA SURGERY  2013   TONSILLECTOMY      Social History:  Social History   Socioeconomic History   Marital status: Married    Spouse name: Not on file   Number of children: Not on file   Years of education: Not on file   Highest education level: Not on file  Occupational History   Not on file  Tobacco Use   Smoking status: Former    Types:  Cigarettes    Quit date: 07/16/1979    Years since quitting: 42.1   Smokeless tobacco: Never  Vaping Use   Vaping Use: Never used  Substance and Sexual Activity   Alcohol use: No    Alcohol/week: 0.0 standard drinks   Drug use: No   Sexual activity: Not Currently  Other Topics Concern   Not on file  Social History Narrative   Not on file   Social Determinants of Health   Financial Resource Strain: Not on file  Food Insecurity: Not on file  Transportation Needs: Not on file  Physical Activity: Not on file  Stress: Not on file  Social Connections: Not on file  Intimate Partner Violence: Not on file    Family History:  Family History  Problem Relation Age of Onset   Heart attack Brother    Heart disease Brother    Heart attack Brother    Heart disease Brother    Hypertension Mother    Stroke Mother    Heart attack Father    Diabetes Father     Medications:   Current Outpatient Medications on File Prior to Visit  Medication Sig Dispense Refill   apixaban (ELIQUIS) 2.5 MG TABS tablet Take 1 tablet (2.5 mg total) by mouth 2 (two) times daily. 180 tablet 1   atorvastatin (LIPITOR) 20 MG tablet Take 1 tablet (20 mg total) by mouth daily. 90 tablet 3   Cholecalciferol (VITAMIN D3) 25 MCG (1000 UT) CAPS Take 3 capsules  (3,000 units) Daily 1 capsule 0   LORazepam (ATIVAN) 0.5 MG tablet TAKE 1/2-1 TABLET BY MOUTH AT BEDTIME ONLY IF NEEDED FOR SLEEP AND PLEASE TRY TO LIMIT TO 5 DAYS A WEEK TO AVOID ADDICTION AND DEMENTIA 30 tablet 0   metoprolol succinate (TOPROL-XL) 25 MG 24 hr tablet Take  1 tablet  Daily  for Heart & BP 90 tablet 3   No current facility-administered medications on file prior to visit.    Allergies:   Allergies  Allergen Reactions   Adhesive [Tape]    Advil [Ibuprofen]    Atenolol       OBJECTIVE:  Physical Exam  Vitals:   08/16/21 0839  BP: 137/86  Pulse: 91  Weight: 106 lb (48.1 kg)  Height: 5\' 2"  (1.575 m)     Body mass index is 19.39  kg/m. No results found.  General: Frail very pleasant elderly Caucasian female, seated, in no evident distress Head: head normocephalic and atraumatic.   Neck: supple with no carotid or supraclavicular bruits Cardiovascular: regular rate and rhythm, no murmurs Musculoskeletal: no deformity Skin:  no rash/petichiae Vascular:  Normal pulses all extremities   Neurologic Exam Mental Status: Awake and fully alert.   Fluent speech and language.  Oriented to place and time. Recent and remote memory intact. Attention span, concentration and fund of knowledge appropriate during visit. Mood and affect appropriate.   Cranial Nerves: Pupils equal, briskly reactive to light. Extraocular movements full without nystagmus. Visual fields full to confrontation.  HOH bilaterally. Facial sensation intact. Face, tongue, palate moves normally and symmetrically.  Motor: Normal bulk and tone. Normal strength in all tested extremity muscles except very mild left hip flexor weakness Sensory.: intact to touch , pinprick , position and vibratory sensation.  Coordination: Rapid alternating movements normal in all extremities. Finger-to-nose and heel-to-shin performed accurately bilaterally. Gait and Station: Arises from chair without difficulty. Stance is normal. Gait demonstrates normal stride length with mild imbalance initially without use of assistive device.  Reflexes: 1+ and symmetric. Toes downgoing.         ASSESSMENT: Leslie [name] is a 88 y.o. year old female presented with slurred speech and left facial droop on 10/01/2020 with stroke work-up revealing small left cerebellum and several punctate right frontoparietal cortex infarcts, likely secondary to cardiomyopathy with low EF of 25% with minimal to no residual deficits. Vascular risk factors include cardiomyopathy, HTN, HLD, CHF and advanced age.      PLAN:  L cerebellum and R frontoparietal infarcts:  Continue Eliquis (apixaban) 2.5 mg twice daily   and atorvastatin 20 mg daily for secondary stroke prevention.  Request ongoing refills and management to PCP or cardiology Discussed secondary stroke prevention measures and importance of close PCP follow up for aggressive stroke risk factor management  HTN: BP goal <130/90.  Well-controlled on metoprolol per cardiology HLD: LDL goal <70. LDL 58 on atorvastatin 20 mg daily per PCP Cardiomyopathy: EF 20-25% 12/2020 and 09/2020.  Routinely followed by Dr. Anne Fu on Eliquis 2.5 mg twice daily (age and weight) for secondary stroke prevention. Advised to schedule f/u visit as overdue.    Overall stable.  No further recommendations.  Follow-up as needed.   CC:  PCP: Unk Pinto, MD    I spent 26 minutes of face-to-face and non-face-to-face time with patient and son.  This included previsit chart review, lab review, study review, electronic health record documentation, patient education regarding stroke and etiology, secondary stroke prevention measures and importance of managing stroke risk factors, minimal residual deficits and answered all other questions to patient and sons satisfaction  Frann Rider, AGNP-BC  [facility] [address] [place], [place] [address]  Phone [phone] Fax [phone] Note: This document was prepared with digital dictation and possible smart phrase technology. Any transcriptional errors that result from this process are unintentional.

## 2021-08-16 NOTE — Patient Instructions (Signed)
Continue Eliquis (apixaban) daily  and atorvastatin for secondary stroke prevention - request ongoing refills be obtained from either your PCP or cardiologist  Please ensure you schedule a follow-up visit with your cardiologist for routine follow-up  Continue to follow up with PCP regarding cholesterol and blood pressure management  Maintain strict control of hypertension with blood pressure goal below 130/90 and cholesterol with LDL cholesterol (bad cholesterol) goal below 70 mg/dL.   Signs of a Stroke? Follow the BEFAST method:  Balance Watch for a sudden loss of balance, trouble with coordination or vertigo Eyes Is there a sudden loss of vision in one or both eyes? Or double vision?  Face: Ask the person to smile. Does one side of the face droop or is it numb?  Arms: Ask the person to raise both arms. Does one arm drift downward? Is there weakness or numbness of a leg? Speech: Ask the person to repeat a simple phrase. Does the speech sound slurred/strange? Is the person confused ? Time: If you observe any of these signs, call 911.       Thank you for coming to see [place] at [facility]. I hope we have been able to provide you high quality care today.  You may receive a patient satisfaction survey over the next few weeks. We would appreciate your feedback and comments so that we may continue to improve ourselves and the health of our patients.

## 2021-08-21 ENCOUNTER — Ambulatory Visit: Payer: Medicare Other | Admitting: Pharmacist

## 2021-08-21 ENCOUNTER — Telehealth: Payer: Medicare Other | Admitting: Pharmacist

## 2021-08-23 ENCOUNTER — Telehealth: Payer: Medicare Other | Admitting: Pharmacist

## 2021-08-23 DIAGNOSIS — H401432 Capsular glaucoma with pseudoexfoliation of lens, bilateral, moderate stage: Secondary | ICD-10-CM | POA: Diagnosis not present

## 2021-09-27 ENCOUNTER — Ambulatory Visit: Payer: Medicare Other | Admitting: Cardiology

## 2021-10-08 NOTE — Progress Notes (Signed)
?6  MONTH FOLLOW UP ? ?Assessment:  ? ? ?Atherosclerosis of aorta ([place])- CT 09/2020 ?Control blood pressure, cholesterol, glucose, increase exercise.  ? ?Chronic combined systolic and diastolic CHF (congestive heart failure) ([place]) ?Appears euvolemic ?On BB; not on losartan due to hyperkalemia, has follow up ECHO scheduled ?Cardiology following ?Reviewed low sodium and fluid intake ?Call office or present to ED for any weight gain with new fatigue, dyspnea or chest pain ? ?Labile hypertension ?Currently off of medications and at goal ?Monitor blood pressure at home; call if consistently over 140/90 ?Continue DASH diet.   ?Reminder to go to the ER if any CP, SOB, nausea, dizziness, severe HA, changes vision/speech, left arm numbness and tingling and jaw pain. ? ?CKD IIIa ([place]) ?Increase fluids, avoid NSAIDS, monitor sugars, will monitor ?- CMP/GFR ? ?Vitamin D deficiency ?At goal at recent check; continue to recommend supplementation for goal of 60-100 ?Defer vitamin D level ? ?Hx of Prediabetes ?Discussed disease and risks ?Discussed diet/exercise, weight management  ?A1C annually ? ?Medication management ?CBC, CMP/GFR ? ?Hyperlipidemia ?Continue medications: atorvastatin 20 mg daily  ?Continue low cholesterol diet and exercise.  ?- lipid panel ? ?Hx of Embolic CVA ?Neuro following; she is on NOAC until EF improves 35%+ ?LDL goal <70 ? ?Anxiety ?Discussed goal to avoid daily benzo use, risks discussed ?Try melatonin 5-15 mg, low dose benadryl, or can call back and consider trazodone if above not helpful ?Goal at minimum to limit benzo to <5 days/week  ?Continue lifestyle efforts ? ?BMI 22 ?Continue to recommend diet heavy in fruits and veggies and low in animal meats, cheeses, and dairy products, appropriate calorie intake ?Discuss exercise recommendations routinely ?Continue to monitor weight at each visit ? ? ?Over 30 minutes of exam, counseling, chart review and critical decision making was performed ?Future  Appointments  ?Date Time Provider [place]  ?10/19/2021  8:00 AM Jerline Pain, MD [facility]  ?04/11/2022  3:00 PM Unk Pinto, MD [facility] None  ?05/17/2022 11:00 AM Liane Comber, NP [facility] None  ? ? ?Subjective:  ?Leslie [name] is a 88 y.o. female who presents for 3  month follow up.  ? ?She was admitted [id number] cerebral embolic CVA (without residual deficits other than mild short term memory loss after inpatient therapy, son has noted improving), initially on DAPT transitioned to elequis 2.5 mg BID due to depressed EF -  2D echo showed severely depressed LVEF with akinesis of the septum, inferior and apical walls, hypokinesis elsewhere.  LVEF is 25%, normal RV. Plan to continue NOAC until EF 35%+. Has followed up with cardiology. Follow up echo 12/2020 showed EF remained 20-25% ? ?She is accompanied by her son today, back to driving ans staying by herself. She is living at her house . She has seniors helping seniors  who visit her 3 times a week. She is still driving, she goes out to eat daily. Due to have drivers license renewed [id number] ? ?Last appointment with neurology was 08/16/21: ?L cerebellum and R frontoparietal infarcts:  ?Continue Eliquis (apixaban) 2.5 mg twice daily  and atorvastatin 20 mg daily for secondary stroke prevention.  Request ongoing refills and management to PCP or cardiology ?Discussed secondary stroke prevention measures and importance of close PCP follow up for aggressive stroke risk factor management  ?HTN: BP goal <130/90.  Well-controlled on metoprolol per cardiology ?HLD: LDL goal <70. LDL 58 on atorvastatin 20 mg daily per PCP ?Cardiomyopathy: EF 20-25% 12/2020 and 09/2020.  Routinely followed by Dr. Marlou Porch  on Eliquis 2.5 mg twice daily (age and weight) for secondary stroke prevention. Advised to schedule f/u visit as overdue. ? ? ?She is currently prescribed ativan 0.5 mg for anxiety (reduced from previous 1 mg); she reports she takes dakly at night  for sleep, taking 1/2 tab (0.25 mg). Hasn't needed during the day. 30 tabs typically last 60 days. Last prescription for Ativan 0.5 mg #30 was filled 05/11/21 ? ?BMI is Body mass index is 19.28 kg/m?., she has been working on diet and exercise. Walks 5 days a week, 30 min daily. She is pushing fluids drinks 14 oz glass of water in the morning.  ?Wt Readings from Last 3 Encounters:  ?10/09/21 105 lb 6.4 oz (47.8 kg)  ?08/16/21 106 lb (48.1 kg)  ?05/17/21 104 lb (47.2 kg)  ? ?Her blood pressure has been controlled at home (100s-140/80s), today their BP is BP: 132/88  ?BP Readings from Last 3 Encounters:  ?10/09/21 132/88  ?08/16/21 137/86  ?05/17/21 110/72  ?  ?She does workout. She denies chest pain, shortness of breath, dizziness.  ? ?She has aortic atherosclerosis and emphysema per CT 09/2020.  ? ?She is on cholesterol medication (newly on atorvastatin 20 mg daily, cardiology wanted fasting follow up lab, she reports non-fasting today) and denies myalgias. Her cholesterol is not at goal LDL <70. The cholesterol last visit was:   ?Lab Results  ?Component Value Date  ? CHOL 129 04/11/2021  ? HDL 66 04/11/2021  ? [place] 50 04/11/2021  ? TRIG 57 04/11/2021  ? CHOLHDL 2.0 04/11/2021  ? ? She has been working on diet and exercise for hx prediabetes (rarely eats bread, does have a sweet tooth), and denies foot ulcerations, increased appetite, nausea, paresthesia of the feet, polydipsia, polyuria, visual disturbances, vomiting and weight loss. Last A1C in the office was:  ?Lab Results  ?Component Value Date  ? HGBA1C 5.5 04/11/2021  ? ?She does push water intake, . Last GFR: ?Lab Results  ?Component Value Date  ? GFRNONAA 53 (L) 01/08/2021  ? GFRNONAA 73 10/12/2020  ? GFRNONAA >60 10/09/2020  ? ?Patient is on Vitamin D supplement,  taking 3000 IU but admits irregular recently , last check was:   ?Lab Results  ?Component Value Date  ? VD25OH 41 04/11/2021  ?   ? ?Medication Review: ?Current Outpatient Medications on File  Prior to Visit  ?Medication Sig Dispense Refill  ? apixaban (ELIQUIS) 2.5 MG TABS tablet Take 1 tablet (2.5 mg total) by mouth 2 (two) times daily. 180 tablet 1  ? atorvastatin (LIPITOR) 20 MG tablet Take 1 tablet (20 mg total) by mouth daily. 90 tablet 3  ? Cholecalciferol (VITAMIN D3) 25 MCG (1000 UT) CAPS Take 3 capsules  (3,000 units) Daily 1 capsule 0  ? LORazepam (ATIVAN) 0.5 MG tablet TAKE 1/2-1 TABLET BY MOUTH AT BEDTIME ONLY IF NEEDED FOR SLEEP AND PLEASE TRY TO LIMIT TO 5 DAYS A WEEK TO AVOID ADDICTION AND DEMENTIA 30 tablet 0  ? metoprolol succinate (TOPROL-XL) 25 MG 24 hr tablet Take  1 tablet  Daily  for Heart & BP 90 tablet 3  ? ?No current facility-administered medications on file prior to visit.  ? ? ?Allergies  ?Allergen Reactions  ? Adhesive [Tape]   ? Advil [Ibuprofen]   ? Atenolol   ? ? ?Current Problems (verified) ?Patient Active Problem List  ? Diagnosis Date Noted  ? Aortic atherosclerosis ([place]) - CT 09/2020 10/11/2020  ? Pulmonary emphysema ([place]) - per CT 09/2020  10/11/2020  ? Slow transit constipation   ? Chronic combined systolic and diastolic CHF (congestive heart failure) ([place])   ? Chronic low back pain without sciatica   ? History of embolic stroke [id number]  ? DNR no code (do not resuscitate) 10/01/2020  ? Thrombocytosis 10/01/2020  ? Body mass index (BMI) of 19.0-19.9 in adult 05/15/2020  ? CKD (chronic kidney disease) stage 3, GFR 30-59 ml/min (HCC) 05/15/2020  ? Labile hypertension 06/16/2018  ? FHx: heart disease 06/16/2018  ? Former smoker 06/16/2018  ? Abnormal glucose 06/16/2018  ? Anxiety 03/04/2018  ? Medication management 04/08/2014  ? Vitamin D deficiency 07/01/2013  ? Hyperlipidemia, mixed 07/01/2013  ? LBBB (left bundle branch block) 02/20/2010  ? ? ?SURGICAL HISTORY ?She  has a past surgical history that includes Glaucoma surgery (2013) and Tonsillectomy. ?FAMILY HISTORY ?Her family history includes Diabetes in her father; Heart attack in her brother, brother, and father;  Heart disease in her brother and brother; Hypertension in her mother; Stroke in her mother. ?SOCIAL HISTORY ?She  reports that she quit smoking about 42 years ago. Her smoking use included cigarettes. She ha

## 2021-10-09 ENCOUNTER — Other Ambulatory Visit: Payer: Self-pay

## 2021-10-09 ENCOUNTER — Ambulatory Visit (INDEPENDENT_AMBULATORY_CARE_PROVIDER_SITE_OTHER): Payer: Medicare Other | Admitting: Nurse Practitioner

## 2021-10-09 ENCOUNTER — Encounter: Payer: Self-pay | Admitting: Nurse Practitioner

## 2021-10-09 VITALS — BP 132/88 | HR 87 | Temp 97.5°F | Wt 105.4 lb

## 2021-10-09 DIAGNOSIS — R7309 Other abnormal glucose: Secondary | ICD-10-CM | POA: Diagnosis not present

## 2021-10-09 DIAGNOSIS — F419 Anxiety disorder, unspecified: Secondary | ICD-10-CM

## 2021-10-09 DIAGNOSIS — E559 Vitamin D deficiency, unspecified: Secondary | ICD-10-CM

## 2021-10-09 DIAGNOSIS — Z6822 Body mass index (BMI) 22.0-22.9, adult: Secondary | ICD-10-CM

## 2021-10-09 DIAGNOSIS — R0989 Other specified symptoms and signs involving the circulatory and respiratory systems: Secondary | ICD-10-CM

## 2021-10-09 DIAGNOSIS — Z8673 Personal history of transient ischemic attack (TIA), and cerebral infarction without residual deficits: Secondary | ICD-10-CM | POA: Diagnosis not present

## 2021-10-09 DIAGNOSIS — I700 Atherosclerosis of aorta: Secondary | ICD-10-CM | POA: Diagnosis not present

## 2021-10-09 DIAGNOSIS — Z79899 Other long term (current) drug therapy: Secondary | ICD-10-CM | POA: Diagnosis not present

## 2021-10-09 DIAGNOSIS — J439 Emphysema, unspecified: Secondary | ICD-10-CM | POA: Diagnosis not present

## 2021-10-09 DIAGNOSIS — N1831 Chronic kidney disease, stage 3a: Secondary | ICD-10-CM | POA: Diagnosis not present

## 2021-10-09 DIAGNOSIS — E782 Mixed hyperlipidemia: Secondary | ICD-10-CM

## 2021-10-09 DIAGNOSIS — I5042 Chronic combined systolic (congestive) and diastolic (congestive) heart failure: Secondary | ICD-10-CM | POA: Diagnosis not present

## 2021-10-10 LAB — CBC WITH DIFFERENTIAL/PLATELET
Absolute Monocytes: 611 cells/uL (ref 200–950)
Basophils Absolute: 86 cells/uL (ref 0–200)
Basophils Relative: 1 %
Eosinophils Absolute: 86 cells/uL (ref 15–500)
Eosinophils Relative: 1 %
HCT: 38.9 % (ref 35.0–45.0)
Hemoglobin: 12.9 g/dL (ref 11.7–15.5)
Lymphs Abs: 834 cells/uL — ABNORMAL LOW (ref 850–3900)
MCH: 29.9 pg (ref 27.0–33.0)
MCHC: 33.2 g/dL (ref 32.0–36.0)
MCV: 90.3 fL (ref 80.0–100.0)
MPV: 10 fL (ref 7.5–12.5)
Monocytes Relative: 7.1 %
Neutro Abs: 6983 cells/uL (ref 1500–7800)
Neutrophils Relative %: 81.2 %
Platelets: 496 10*3/uL — ABNORMAL HIGH (ref 140–400)
RBC: 4.31 10*6/uL (ref 3.80–5.10)
RDW: 14.3 % (ref 11.0–15.0)
Total Lymphocyte: 9.7 %
WBC: 8.6 10*3/uL (ref 3.8–10.8)

## 2021-10-10 LAB — COMPLETE METABOLIC PANEL WITH GFR
AG Ratio: 1.6 (calc) (ref 1.0–2.5)
ALT: 12 U/L (ref 6–29)
AST: 17 U/L (ref 10–35)
Albumin: 4 g/dL (ref 3.6–5.1)
Alkaline phosphatase (APISO): 42 U/L (ref 37–153)
BUN: 23 mg/dL (ref 7–25)
CO2: 28 mmol/L (ref 20–32)
Calcium: 9.8 mg/dL (ref 8.6–10.4)
Chloride: 105 mmol/L (ref 98–110)
Creat: 0.87 mg/dL (ref 0.60–0.95)
Globulin: 2.5 g/dL (calc) (ref 1.9–3.7)
Glucose, Bld: 91 mg/dL (ref 65–99)
Potassium: 4.7 mmol/L (ref 3.5–5.3)
Sodium: 139 mmol/L (ref 135–146)
Total Bilirubin: 0.9 mg/dL (ref 0.2–1.2)
Total Protein: 6.5 g/dL (ref 6.1–8.1)
eGFR: 62 mL/min/{1.73_m2} (ref >=60)

## 2021-10-10 LAB — HEMOGLOBIN A1C
Hgb A1c MFr Bld: 5.5 % of total Hgb (ref <5.7)
Mean Plasma Glucose: 111 mg/dL
eAG (mmol/L): 6.2 mmol/L

## 2021-10-10 LAB — LIPID PANEL
Cholesterol: 125 mg/dL (ref <200)
HDL: 66 mg/dL (ref >=50)
LDL Cholesterol (Calc): 44 mg/dL (calc)
Non-HDL Cholesterol (Calc): 59 mg/dL (calc) (ref <130)
Total CHOL/HDL Ratio: 1.9 (calc) (ref <5.0)
Triglycerides: 70 mg/dL (ref <150)

## 2021-10-19 ENCOUNTER — Ambulatory Visit (INDEPENDENT_AMBULATORY_CARE_PROVIDER_SITE_OTHER): Payer: Medicare Other | Admitting: Cardiology

## 2021-10-19 ENCOUNTER — Encounter: Payer: Self-pay | Admitting: Cardiology

## 2021-10-19 DIAGNOSIS — I5042 Chronic combined systolic (congestive) and diastolic (congestive) heart failure: Secondary | ICD-10-CM

## 2021-10-19 DIAGNOSIS — E782 Mixed hyperlipidemia: Secondary | ICD-10-CM

## 2021-10-19 DIAGNOSIS — Z8673 Personal history of transient ischemic attack (TIA), and cerebral infarction without residual deficits: Secondary | ICD-10-CM

## 2021-10-19 DIAGNOSIS — I639 Cerebral infarction, unspecified: Secondary | ICD-10-CM | POA: Diagnosis not present

## 2021-10-19 NOTE — Progress Notes (Signed)
?Cardiology Office Note:   ? ?Date:  10/19/2021  ? ?ID:  Leslie [name], DOB [birth year], MRN [id number] ? ?PCP:  Lucky Cowboy, MD ?  ?[facility] Providers ?Cardiologist:  Donato Schultz, MD    ? ?Referring MD: Lucky Cowboy, MD  ? ? ?History of Present Illness:   ? ?Leslie [name] is a 88 y.o. female here for follow-up of cardiomyopathy EF 25% with hypertension hyperlipidemia.  Small punctate infarct on brain MRI right frontoparietal vertex and left cerebellum back in March 2022.  Had facial droop.  Stroke was felt to be possible embolic source and Eliquis was recommended until EF improved.  Low-dose beta-blocker was added because of asymptomatic nonsustained ventricular tachycardia.  Her son lives in [place].  Her other son lives in [place].  She enjoys walking around her 2 acre property previously. ? ?Echo in March as well as June 2022 showed EF of 25%.  Personally reviewed. ? ?Overall still doing very well.  Spry for her age.  NYHA class I.  No syncope no bleeding no orthopnea no PND no shortness of breath no chest pain ? ?Past Medical History:  ?Diagnosis Date  ? Anxiety disorder   ? Cardiomyopathy (HCC)   ? Glaucoma   ? Hearing loss   ? HTN (hypertension)   ? Hyperglycemia   ? Hyperlipidemia   ? LBBB (left bundle branch block)   ? Stroke (cerebrum) (HCC)   ? Vitamin D deficiency   ? ? ?Past Surgical History:  ?Procedure Laterality Date  ? GLAUCOMA SURGERY  2013  ? TONSILLECTOMY    ? ? ?Current Medications: ?Current Meds  ?Medication Sig  ? apixaban (ELIQUIS) 2.5 MG TABS tablet Take 1 tablet (2.5 mg total) by mouth 2 (two) times daily.  ? atorvastatin (LIPITOR) 20 MG tablet Take 1 tablet (20 mg total) by mouth daily.  ? Cholecalciferol (VITAMIN D3) 25 MCG (1000 UT) CAPS Take 3 capsules  (3,000 units) Daily  ? LORazepam (ATIVAN) 0.5 MG tablet TAKE 1/2-1 TABLET BY MOUTH AT BEDTIME ONLY IF NEEDED FOR SLEEP AND PLEASE TRY TO LIMIT TO 5 DAYS A WEEK TO AVOID ADDICTION AND DEMENTIA  ? metoprolol succinate  (TOPROL-XL) 25 MG 24 hr tablet Take  1 tablet  Daily  for Heart & BP  ?  ? ?Allergies:   Adhesive [tape], Advil [ibuprofen], and Atenolol  ? ?Social History  ? ?Socioeconomic History  ? Marital status: Married  ?  Spouse name: Not on file  ? Number of children: Not on file  ? Years of education: Not on file  ? Highest education level: Not on file  ?Occupational History  ? Not on file  ?Tobacco Use  ? Smoking status: Former  ?  Types: Cigarettes  ?  Quit date: 07/16/1979  ?  Years since quitting: 42.2  ? Smokeless tobacco: Never  ?Vaping Use  ? Vaping Use: Never used  ?Substance and Sexual Activity  ? Alcohol use: No  ?  Alcohol/week: 0.0 standard drinks  ? Drug use: No  ? Sexual activity: Not Currently  ?Other Topics Concern  ? Not on file  ?Social History Narrative  ? Not on file  ? ?Social Determinants of Health  ? ?Financial Resource Strain: Not on file  ?Food Insecurity: Not on file  ?Transportation Needs: Not on file  ?Physical Activity: Not on file  ?Stress: Not on file  ?Social Connections: Not on file  ?  ? ?Family History: ?The patient's family history includes Diabetes in her father;  Heart attack in her brother, brother, and father; Heart disease in her brother and brother; Hypertension in her mother; Stroke in her mother. ? ?ROS:   ?Please see the history of present illness.    ? All other systems reviewed and are negative. ? ?EKGs/Labs/Other Studies Reviewed:   ? ?Prior EKG shows left bundle branch block. ?Recent Labs: ?04/11/2021: Magnesium 2.4; TSH 2.54 ?10/09/2021: ALT 12; BUN 23; Creat 0.87; Hemoglobin 12.9; Platelets 496; Potassium 4.7; Sodium 139  ?Recent Lipid Panel ?   ?Component Value Date/Time  ? CHOL 125 10/09/2021 1136  ? CHOL 114 01/10/2021 0827  ? TRIG 70 10/09/2021 1136  ? HDL 66 10/09/2021 1136  ? HDL 65 01/10/2021 0827  ? CHOLHDL 1.9 10/09/2021 1136  ? VLDL 10 10/01/2020 1801  ? LDLCALC 44 10/09/2021 1136  ? ? ? ?Risk Assessment/Calculations:   ? ? ?    ? ?   ? ?Physical Exam:   ? ?VS:  BP  124/88   Pulse 97   Ht 5\' 2"  (1.575 m)   Wt 105 lb 6.4 oz (47.8 kg)   SpO2 96%   BMI 19.28 kg/m?    ? ?Wt Readings from Last 3 Encounters:  ?10/19/21 105 lb 6.4 oz (47.8 kg)  ?10/09/21 105 lb 6.4 oz (47.8 kg)  ?08/16/21 106 lb (48.1 kg)  ?  ? ?GEN:  Well nourished, well developed in no acute distress ?HEENT: Normal ?NECK: No JVD; No carotid bruits ?LYMPHATICS: No lymphadenopathy ?CARDIAC: RRR, no murmurs, no rubs, gallops ?RESPIRATORY:  Clear to auscultation without rales, wheezing or rhonchi  ?ABDOMEN: Soft, non-tender, non-distended ?MUSCULOSKELETAL:  No edema; No deformity  ?SKIN: Warm and dry ?NEUROLOGIC:  Alert and oriented x 3 ?PSYCHIATRIC:  Normal affect  ? ?ASSESSMENT:   ? ?1. Chronic combined systolic and diastolic CHF (congestive heart failure) (HCC)   ?2. Hyperlipidemia, mixed   ?3. History of embolic stroke   ? ?PLAN:   ? ?In order of problems listed above: ? ?Chronic combined systolic and diastolic CHF (congestive heart failure) (HCC) ?EF remains 25%.  She is NYHA class I however.  Asymptomatic.  Doing very well.  No orthopnea walking her property, 2 acres 5 times a week without any difficulty.  She is able to bend down, squat and get back up she showed me.  Here today with her son from [place].  She has another son from [place].  Continue with low-dose Toprol.  No changes made. ? ?Hyperlipidemia, mixed ?Continue with atorvastatin 20 mg.  LDL 44.  Excellent.  No myalgias. ? ?History of embolic stroke ?This is why she is on Eliquis.  Therapy was that with her EF of 25% she may have had an embolic source.  She is not having any bleeding.  Hemoglobin 12.9.  Continue.  Creatinine at last check 0.8. ?  ? ? ?  ? ? ?Medication Adjustments/Labs and Tests Ordered: ?Current medicines are reviewed at length with the patient today.  Concerns regarding medicines are outlined above.  ?No orders of the defined types were placed in this encounter. ? ?No orders of the defined types were placed in this  encounter. ? ? ?Patient Instructions  ?Medication Instructions:  ?The current medical regimen is effective;  continue present plan and medications. ? ?*If you need a refill on your cardiac medications before your next appointment, please call your pharmacy* ? ?Follow-Up: ?At [facility], you and your health needs are our priority.  As part of our continuing mission to provide you with  exceptional heart care, we have created designated Provider Care Teams.  These Care Teams include your primary Cardiologist (physician) and Advanced Practice Providers (APPs -  Physician Assistants and Nurse Practitioners) who all work together to provide you with the care you need, when you need it. ? ?We recommend signing up for the patient portal called "MyChart".  Sign up information is provided on this After Visit Summary.  MyChart is used to connect with patients for Virtual Visits (Telemedicine).  Patients are able to view lab/test results, encounter notes, upcoming appointments, etc.  Non-urgent messages can be sent to your provider as well.   ?To learn more about what you can do with MyChart, go to [email or web address].   ? ?Your next appointment:   ?6 month(s) ? ?The format for your next appointment:   ?In Person ? ?Provider:   ?Chelsea AusVin Bhagat, PA-C, Jari Favreessa Conte, PA-C, Dayna Dunn, PA-C, Jacolyn ReedyMichele Lenze, PA-C, Eligha BridegroomMichelle Swinyer, NP, or Tereso NewcomerScott Weaver, PA-C       ? ?Thank you for choosing [facility]!! ? ? ?  ? ?Signed, ?Donato SchultzMark Kealani Leckey, MD  ?10/19/2021 8:34 AM    ?[facility] ?

## 2021-10-19 NOTE — Patient Instructions (Signed)
Medication Instructions:  °The current medical regimen is effective;  continue present plan and medications. ° °*If you need a refill on your cardiac medications before your next appointment, please call your pharmacy* ° °Follow-Up: °At [facility], you and your health needs are our priority.  As part of our continuing mission to provide you with exceptional heart care, we have created designated Provider Care Teams.  These Care Teams include your primary Cardiologist (physician) and Advanced Practice Providers (APPs -  Physician Assistants and Nurse Practitioners) who all work together to provide you with the care you need, when you need it. ° °We recommend signing up for the patient portal called "MyChart".  Sign up information is provided on this After Visit Summary.  MyChart is used to connect with patients for Virtual Visits (Telemedicine).  Patients are able to view lab/test results, encounter notes, upcoming appointments, etc.  Non-urgent messages can be sent to your provider as well.   °To learn more about what you can do with MyChart, go to [email or web address].   ° °Your next appointment:   °6 month(s) ° °The format for your next appointment:   °In Person ° °Provider:   °Vin Bhagat, PA-C, Tessa Conte, PA-C, Dayna Dunn, PA-C, Michele Lenze, PA-C, Michelle Swinyer, NP, or Scott Weaver, PA-C       ° °Thank you for choosing [facility]!! ° ° ° °

## 2021-10-19 NOTE — Assessment & Plan Note (Signed)
EF remains 25%.  She is NYHA class I however.  Asymptomatic.  Doing very well.  No orthopnea walking her property, 2 acres 5 times a week without any difficulty.  She is able to bend down, squat and get back up she showed me.  Here today with her son from [place].  She has another son from [place].  Continue with low-dose Toprol.  No changes made. ?

## 2021-10-19 NOTE — Assessment & Plan Note (Signed)
Continue with atorvastatin 20 mg.  LDL 44.  Excellent.  No myalgias. ?

## 2021-10-19 NOTE — Assessment & Plan Note (Signed)
This is why she is on Eliquis.  Therapy was that with her EF of 25% she may have had an embolic source.  She is not having any bleeding.  Hemoglobin 12.9.  Continue.  Creatinine at last check 0.8. ?

## 2021-12-12 ENCOUNTER — Other Ambulatory Visit: Payer: Self-pay | Admitting: Adult Health

## 2021-12-12 DIAGNOSIS — I639 Cerebral infarction, unspecified: Secondary | ICD-10-CM

## 2022-02-21 DIAGNOSIS — H401432 Capsular glaucoma with pseudoexfoliation of lens, bilateral, moderate stage: Secondary | ICD-10-CM | POA: Diagnosis not present

## 2022-02-21 DIAGNOSIS — H2512 Age-related nuclear cataract, left eye: Secondary | ICD-10-CM | POA: Diagnosis not present

## 2022-02-21 DIAGNOSIS — H26491 Other secondary cataract, right eye: Secondary | ICD-10-CM | POA: Diagnosis not present

## 2022-02-21 DIAGNOSIS — Z961 Presence of intraocular lens: Secondary | ICD-10-CM | POA: Diagnosis not present

## 2022-02-22 ENCOUNTER — Telehealth: Payer: Self-pay | Admitting: Internal Medicine

## 2022-02-22 NOTE — Progress Notes (Signed)
  Chronic Care Management   Note  02/22/2022 Name: Leslie [name] MRN: [id number] DOB: 1926-08-14  Leslie [name] is a 88 y.o. year old female who is a primary care patient of Lucky Cowboy, MD. I reached out to [name] by phone today in response to a referral sent by [name]'s PCP, Lucky Cowboy, MD.   [name] was given information about Chronic Care Management services today including:  CCM service includes personalized support from designated clinical staff supervised by her physician, including individualized plan of care and coordination with other care providers 24/7 contact phone numbers for assistance for urgent and routine care needs. Service will only be billed when office clinical staff spend 20 minutes or more in a month to coordinate care. Only one practitioner may furnish and bill the service in a calendar month. The patient may stop CCM services at any time (effective at the end of the month) by phone call to the office staff.   WAYNE/ SON verbally agreed to assistance and services provided by embedded care coordination/care management team today.  Follow up plan:   [employer] manager, [employer]

## 2022-03-05 ENCOUNTER — Other Ambulatory Visit: Payer: Self-pay | Admitting: Adult Health

## 2022-03-05 DIAGNOSIS — I639 Cerebral infarction, unspecified: Secondary | ICD-10-CM

## 2022-03-12 ENCOUNTER — Other Ambulatory Visit: Payer: Self-pay

## 2022-03-12 DIAGNOSIS — I639 Cerebral infarction, unspecified: Secondary | ICD-10-CM

## 2022-03-12 MED ORDER — APIXABAN 2.5 MG PO TABS: 2.5000 mg | ORAL_TABLET | Freq: Two times a day (BID) | ORAL | 0 refills | Status: DC

## 2022-04-09 ENCOUNTER — Telehealth: Payer: Self-pay

## 2022-04-09 NOTE — Telephone Encounter (Signed)
LM-04/09/22-Chart Prep started, Reviewing OV, Consults, Hospital visits, Labs and medication changes.  Chart prep completed. Patient data update form complete.  Total time spent: 36 min.

## 2022-04-09 NOTE — Telephone Encounter (Signed)
LM-04/09/22-Care plan created and complete.  Total time spent: 8 min.

## 2022-04-10 NOTE — Telephone Encounter (Signed)
LM-04/10/22-Called pts. Son Patrick Jupiter to confirm pts. Initial CP phone visit for 10/3 at 9:00AM. Unable to reach pts. Son. Left detailed message regarding appointment and to return call to complete precall questions and confirm visit.  Total time spent: 2 min.

## 2022-04-10 NOTE — Telephone Encounter (Signed)
LM-04/10/22-Pts. Son Patrick Jupiter returned call and confirmed CP initial phone visit for 10/3 at 9:00AM. Pt. Son informed me pt. Has no barriers receiving her medications and her top health concern she'd like to discuss is her Eliquis side effect-brusing easily. Informed pt. Son I will inform CP. Pts. Son stated he will do a conference call at the time of visit w/ CP and pt.   Total time spent: 10 min.

## 2022-04-11 ENCOUNTER — Encounter: Payer: Medicare Other | Admitting: Internal Medicine

## 2022-04-12 IMAGING — MR MR HEAD W/O CM
9 of 13 series · 30 of 48 positions shown · non-contrast
Comparison: CT studies same day.

CLINICAL DATA: Slurred speech and left face droop beginning today.

EXAM:
MRI HEAD WITHOUT CONTRAST
TECHNIQUE: Multiplanar, multiecho pulse sequences of the brain and surrounding
structures were obtained without intravenous contrast.

[Series 2: DWI · coronal · 4.0mm · 0.94mm/px · 5 of 68 slices shown (1 of 4)]
[im 1/68]
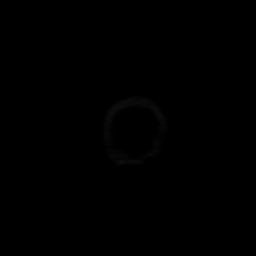
[im 17/68]
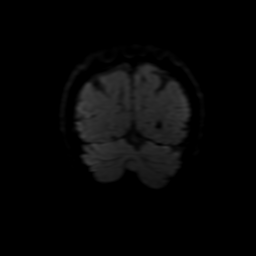
[im 34/68]
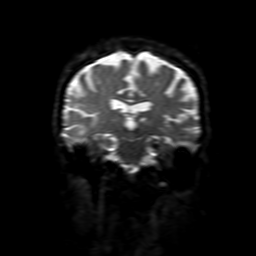
[im 51/68]
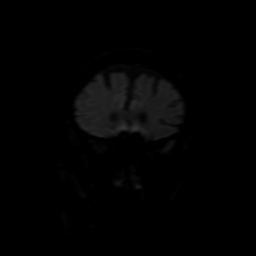
[im 68/68]
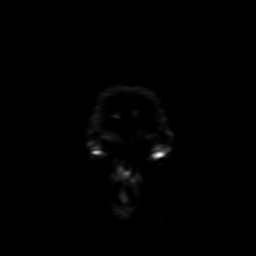

[Series 3: DWI · axial · 3.0mm · 0.94mm/px · z∈[-52,+88]mm · 6 of 96 slices shown (2 of 4)]
[im 1/96]
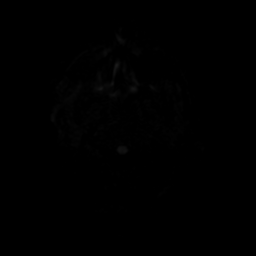
[im 20/96]
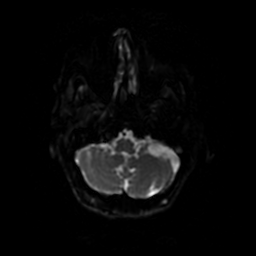
[im 39/96]
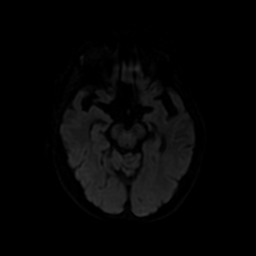
[im 58/96]
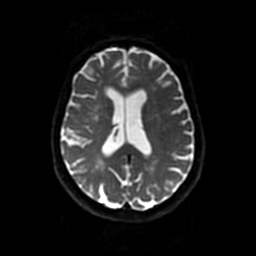
[im 77/96]
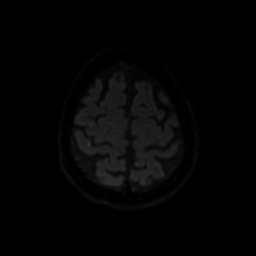
[im 96/96]
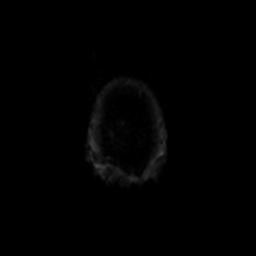

[Series 4: FLAIR · sagittal · 5.0mm · 0.23mm/px · 1 of 23 slices shown (1 of 2)]
[im 1/23]
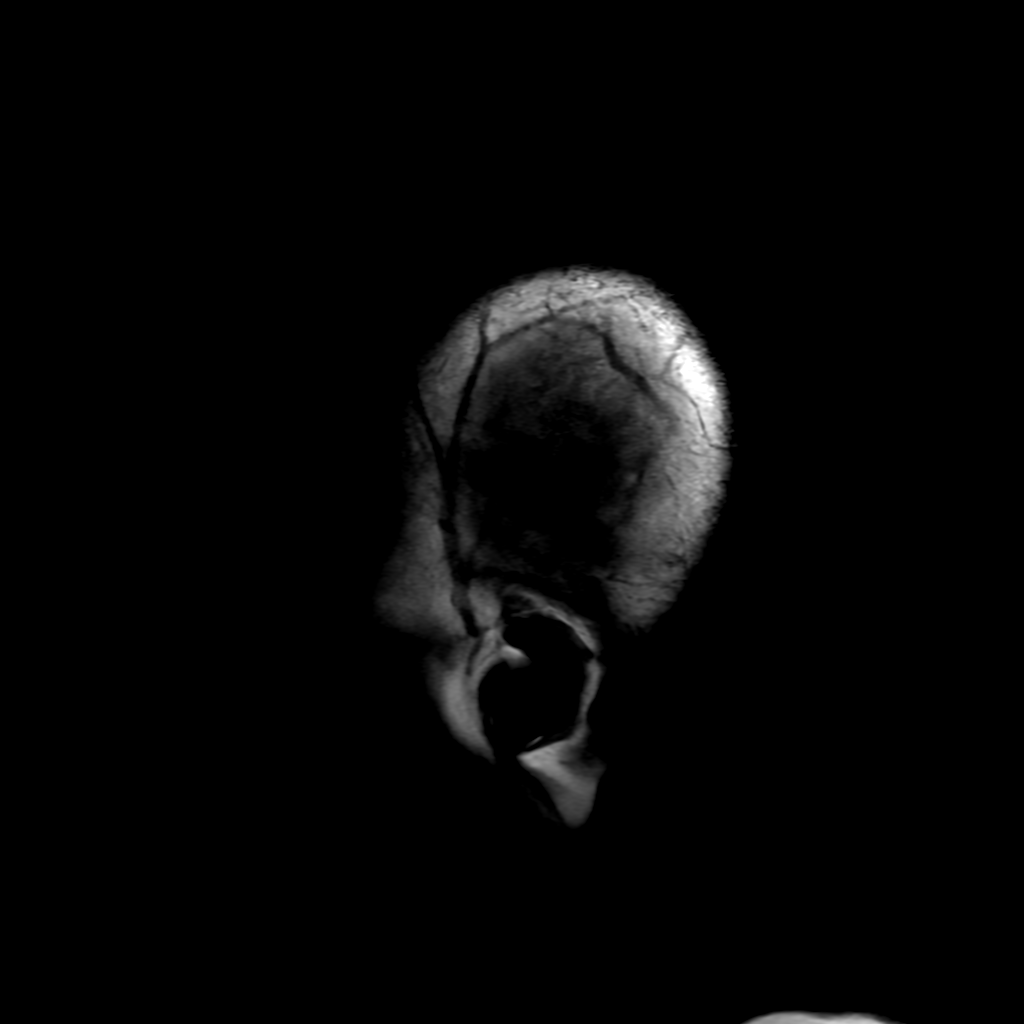

[Series 5: T2 · axial · 5.0mm · 0.23mm/px · 1 of 24 slices shown]
[im 1/24]
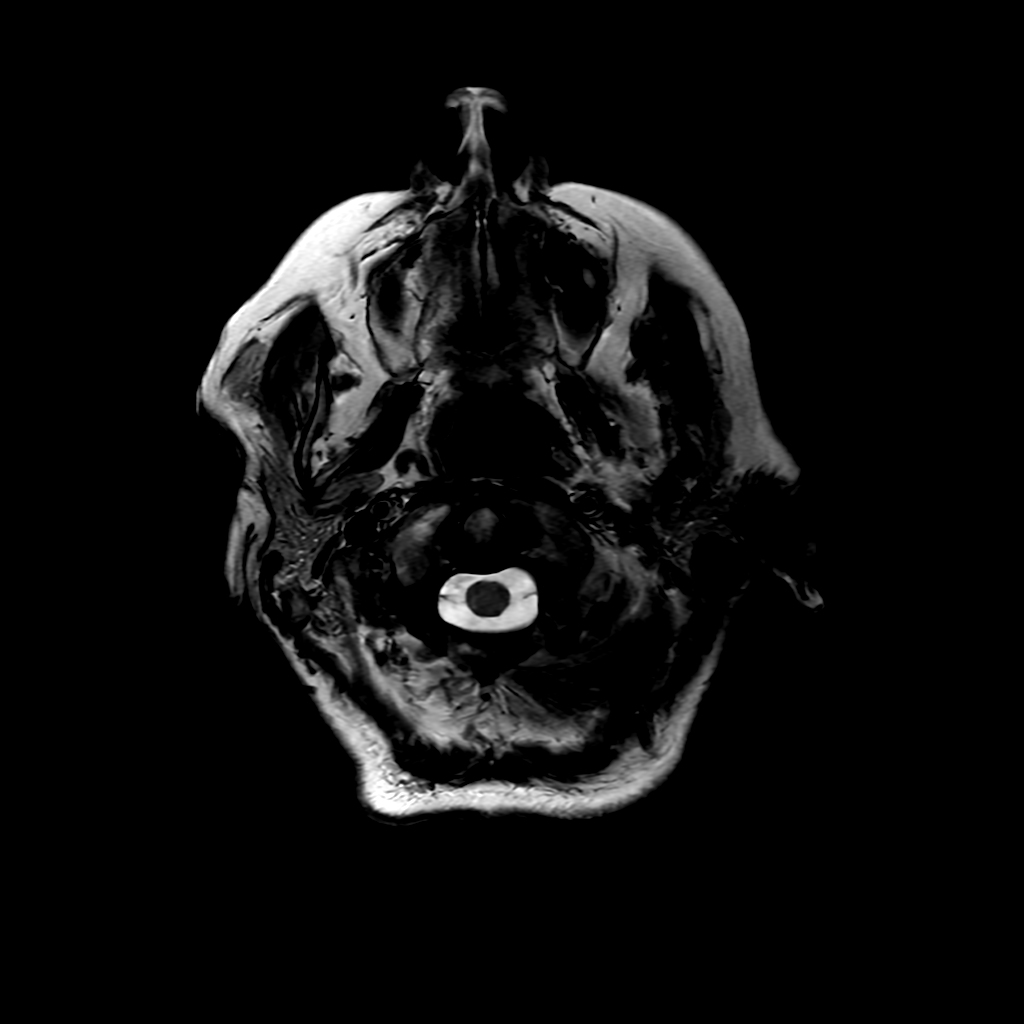

[Series 6: FLAIR · axial · 3.0mm · 0.45mm/px · z∈[-51,+86]mm · 2 of 24 slices shown (2 of 2)]
[im 1/24]
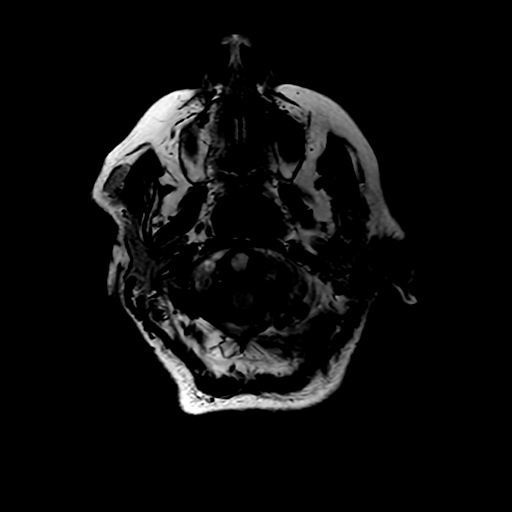
[im 24/24]
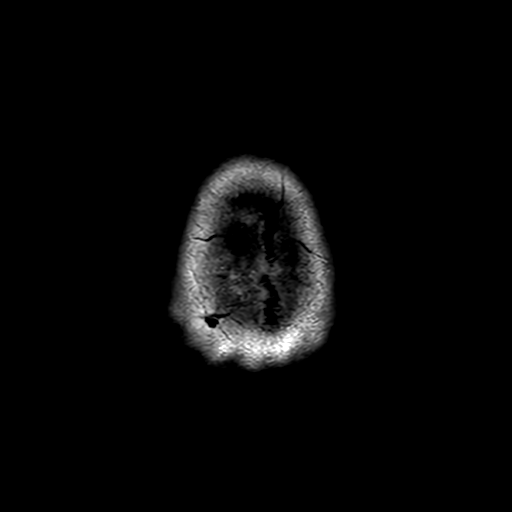

[Series 210: DWI · coronal · 4.0mm · 0.94mm/px · 4 of 68 slices shown (3 of 4)]
[im 1/68]
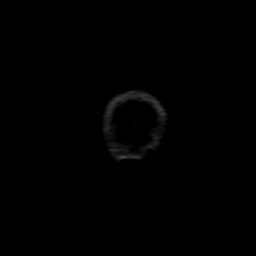
[im 23/68]
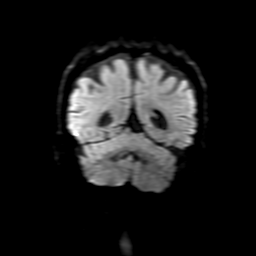
[im 45/68]
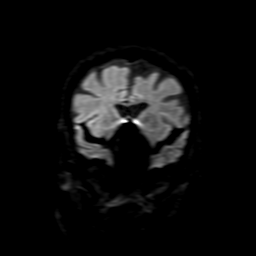
[im 68/68]
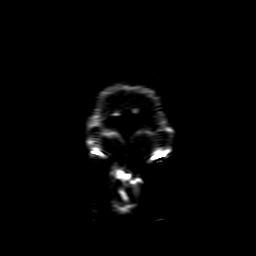

[Series 250: ADC · coronal · 4.0mm · 0.94mm/px · 2 of 34 slices shown (1 of 2)]
[im 1/34]
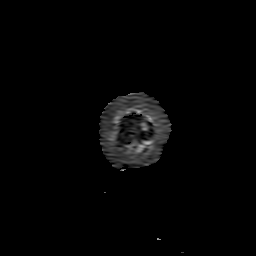
[im 34/34]
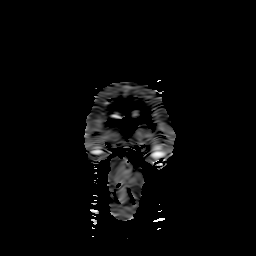

[Series 310: DWI · axial · 3.0mm · 0.94mm/px · z∈[-52,+88]mm · 6 of 96 slices shown (4 of 4)]
[im 1/96]
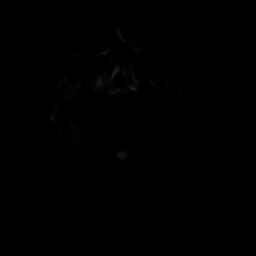
[im 20/96]
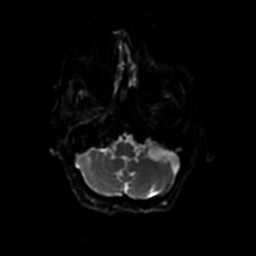
[im 39/96]
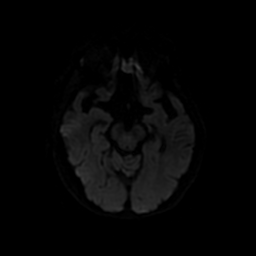
[im 58/96]
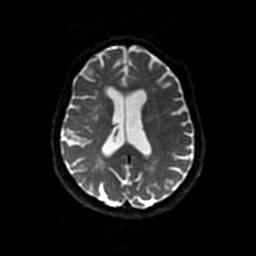
[im 77/96]
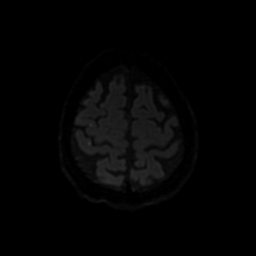
[im 96/96]
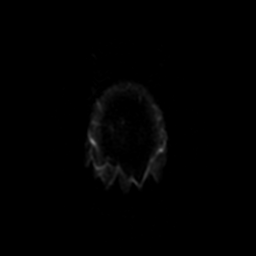

[Series 350: ADC · axial · 3.0mm · 0.94mm/px · z∈[-52,+88]mm · 3 of 48 slices shown (2 of 2)]
[im 1/48]
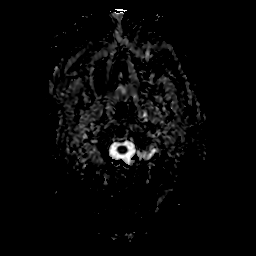
[im 24/48]
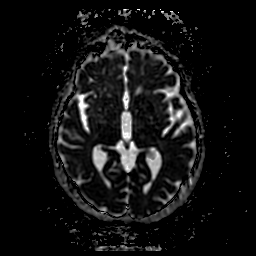
[im 48/48]
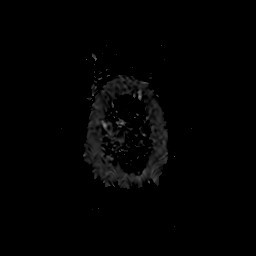

[30 of 48 positions shown; findings below may reference images not displayed]

FINDINGS: Brain: Small acute infarction in the left lateral cerebellum.
Several punctate acute infarctions at the right frontoparietal
vertex affecting the cortex and subcortical white matter. No
confluent large vessel infarction.

Generalized age related atrophy. Chronic small-vessel ischemic
changes of the pons. Numerous old small vessel cerebellar
infarctions. Old small vessel infarctions of the thalami. Moderate
chronic small-vessel ischemic changes of the white matter.

No hydrocephalus, hemorrhage or extra-axial collection.

Vascular: Major vessels at the base of the brain show flow.

Skull and upper cervical spine: Negative

Sinuses/Orbits: Sinuses are clear.  Orbits are negative.

Other: Small right mastoid effusion.
IMPRESSION: 1. Several small/punctate acute infarctions at the right
frontoparietal vertex. Small acute infarction at the left lateral
cerebellum. This distribution would suggest embolic disease from the
heart or ascending aorta.
2. Numerous old small vessel ischemic changes throughout the brain
as outlined above.

## 2022-04-16 ENCOUNTER — Ambulatory Visit: Payer: Medicare Other | Admitting: Pharmacy Technician

## 2022-04-16 ENCOUNTER — Telehealth: Payer: Self-pay

## 2022-04-16 DIAGNOSIS — E782 Mixed hyperlipidemia: Secondary | ICD-10-CM

## 2022-04-16 DIAGNOSIS — R0989 Other specified symptoms and signs involving the circulatory and respiratory systems: Secondary | ICD-10-CM

## 2022-04-16 DIAGNOSIS — Z79899 Other long term (current) drug therapy: Secondary | ICD-10-CM

## 2022-04-16 DIAGNOSIS — N1831 Chronic kidney disease, stage 3a: Secondary | ICD-10-CM

## 2022-04-16 NOTE — Telephone Encounter (Signed)
LM-04/16/22-Called pts. Son [place] and informed him CP is not feeling well, and will call as soon as possible. Pts. Son was very understanding and verbalized agreement.  Total time spent: 7 min.

## 2022-04-17 ENCOUNTER — Encounter: Payer: Self-pay | Admitting: Cardiology

## 2022-04-17 ENCOUNTER — Ambulatory Visit: Payer: Medicare Other | Attending: Cardiology | Admitting: Cardiology

## 2022-04-17 VITALS — BP 140/92 | HR 90 | Ht 62.0 in | Wt 115.3 lb

## 2022-04-17 DIAGNOSIS — Z8673 Personal history of transient ischemic attack (TIA), and cerebral infarction without residual deficits: Secondary | ICD-10-CM | POA: Diagnosis not present

## 2022-04-17 DIAGNOSIS — E782 Mixed hyperlipidemia: Secondary | ICD-10-CM

## 2022-04-17 DIAGNOSIS — I5042 Chronic combined systolic (congestive) and diastolic (congestive) heart failure: Secondary | ICD-10-CM | POA: Diagnosis not present

## 2022-04-17 DIAGNOSIS — I639 Cerebral infarction, unspecified: Secondary | ICD-10-CM

## 2022-04-17 NOTE — Telephone Encounter (Signed)
LM-04/17/22-CP visit notes from 10/3 have been downloaded, edited, and uploaded to pts. Documents.Engagement notes added to pts. Notes. FWD to CP for review.  Total time spent: 4 min.

## 2022-04-17 NOTE — Patient Instructions (Signed)

## 2022-04-17 NOTE — Progress Notes (Signed)
Comprehensive Evaluation &  Examination  Future Appointments  Date Time Provider Department  04/18/2022                    cpe 11:00 AM Unk Pinto, MD [facility]  05/17/2022                    wellness 11:00 AM Darrol Jump, NP [facility]  09/26/2022  9:20 AM Jerline Pain, MD [facility]  04/23/2023                     cpe 11:00 AM Unk Pinto, MD [facility]   Brought today by her son       This very nice 88 y.o. WWF  presents for a comprehensive evaluation and management of multiple medical co-morbidities.  Patient has been followed for HTN, HLD, Prediabetes  and Vitamin D Deficiency. CT scan in Mar 2022 showed Aortic Atherosclerosis .        HTN predates since  81. Patient's BP has been controlled at home . 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  Patient also follows with Dr Marlou Porch for Rt/Lt CHF /EF 20-25%. Patient denies any cardiac symptoms as chest pain, palpitations, shortness of breath, dizziness or ankle swelling. Today's BP is at goal - 130/90 .       Patient's hyperlipidemia is controlled with diet and Atorvastatin . Patient denies myalgias or other medication SE's. Last lipids were at goal:  Lab Results  Component Value Date   CHOL 125 10/09/2021   HDL 66 10/09/2021   LDLCALC 44 10/09/2021   TRIG 70 10/09/2021   CHOLHDL 1.9 10/09/2021        Patient has hx/o prediabetes (A1c 5.7% /2012) and patient denies reactive hypoglycemic symptoms, visual blurring, diabetic polys or paresthesias. Last A1c was  at goal:  Lab Results  Component Value Date   HGBA1C 5.5 10/09/2021         Finally, patient has history of Vitamin D Deficiency and last Vitamin D was low:  Lab Results  Component Value Date   VD25OH 71 04/11/2021       Current Outpatient Medications  Medication Instructions   apixaban (ELIQUIS)  2.5 mg 2 times daily   atorvastatin  20 mg  Daily     VITAMIN D 1000 UT) CAPS  Take 3 capsules  (3,000 units) Daily   XALATAN 0.005 % ophth soln 1 drop, Daily at bedtime   LORazepam  0.5 MG tablet TAKE 1/2-1 TABLET AT BEDTIME ONLY IF NEEDED FOR SLEEP    metoprolol succ-XL 25 MG  Take  1 tablet  Daily     PRED FORTE 1 % ophth  susp 1 drop  4 times daily     Allergies  Allergen Reactions   Adhesive [Tape]    Advil [Ibuprofen]    Atenolol      Past Medical History:  Diagnosis Date   Anxiety disorder    Cardiomyopathy ([place])    Glaucoma    Hearing loss    HTN (hypertension)    Hyperglycemia    Hyperlipidemia    LBBB (left bundle branch block)    Stroke (cerebrum) (HCC)    Vitamin D deficiency      Health Maintenance  Topic Date Due   Zoster Vaccines- Shingrix (1 of 2) Never done   COVID-19 Vaccine (3 -  Pfizer risk series) 11/10/2019   HPV VACCINES  Aged Out   INFLUENZA VACCINE  Discontinued   DEXA SCAN  Discontinued   TETANUS/TDAP  Discontinued     Immunization History  Administered Date(s) Administered   DTaP 05/06/2003   PFIZER   SARS-COV-2 Vacc 09/22/2019, 10/13/2019   Pneumococcal-23 05/06/2003   Zoster, Live 07/16/2007    Last Colon - refuses    Last MGM - refuses   Past Surgical History:  Procedure Laterality Date   GLAUCOMA SURGERY  2013   TONSILLECTOMY       Family History  Problem Relation Age of Onset   Heart attack Brother    Heart disease Brother    Heart attack Brother    Heart disease Brother    Hypertension Mother    Stroke Mother    Heart attack Father    Diabetes Father      Social History   Tobacco Use   Smoking status: Former    Types: Cigarettes    Quit date: 07/16/1979    Years since quitting: 41.7   Smokeless tobacco: Never  Vaping Use   Vaping Use: Never used  Substance Use Topics   Alcohol use: No    Alcohol/week: 0.0 standard drinks   Drug use: No      ROS Constitutional: Denies fever, chills, weight loss/gain, headaches, insomnia,  night sweats, and  change in appetite. Does c/o fatigue. Eyes: Denies redness, blurred vision, diplopia, discharge, itchy, watery eyes.  ENT: Denies discharge, congestion, post nasal drip, epistaxis, sore throat, earache, hearing loss, dental pain, Tinnitus, Vertigo, Sinus pain, snoring.  Cardio: Denies chest pain, palpitations, irregular heartbeat, syncope, dyspnea, diaphoresis, orthopnea, PND, claudication, edema Respiratory: denies cough, dyspnea, DOE, pleurisy, hoarseness, laryngitis, wheezing.  Gastrointestinal: Denies dysphagia, heartburn, reflux, water brash, pain, cramps, nausea, vomiting, bloating, diarrhea, constipation, hematemesis, melena, hematochezia, jaundice, hemorrhoids Genitourinary: Denies dysuria, frequency, urgency, nocturia, hesitancy, discharge, hematuria, flank pain Breast: Breast lumps, nipple discharge, bleeding.  Musculoskeletal: Denies arthralgia, myalgia, stiffness, Jt. Swelling, pain, limp, and strain/sprain. Denies falls. Skin: Denies puritis, rash, hives, warts, acne, eczema, changing in skin lesion Neuro: No weakness, tremor, incoordination, spasms, paresthesia, pain Psychiatric: Denies confusion, memory loss, sensory loss. Denies Depression. Endocrine: Denies change in weight, skin, hair change, nocturia, and paresthesia, diabetic polys, visual blurring, hyper / hypo glycemic episodes.  Heme/Lymph: No excessive bleeding, bruising, enlarged lymph nodes.  Physical Exam  BP (!) 130/90   Pulse 94   Temp 97.6 F (36.4 C)   Resp 17   Ht 5\' 2"  (1.575 m)   Wt 111 lb (50.3 kg)   SpO2 98%   BMI 20.30 kg/m   General Appearance: Adequate nourished &  well groomed and in no apparent distress.  Eyes: PERRLA, EOMs, conjunctiva no swelling or erythema, normal fundi and vessels. Sinuses: No frontal/maxillary tenderness ENT/Mouth: EACs patent / TMs  nl. Nares clear without erythema, swelling, mucoid exudates. Oral hygiene is good. No erythema, swelling, or exudate. Tongue normal,  non-obstructing. Tonsils not swollen or erythematous. Hearing normal.  Neck: Supple, thyroid not palpable. No bruits, nodes or JVD. Respiratory: Respiratory effort normal.  BS equal and clear bilateral without rales, rhonci, wheezing or stridor. Cardio: Heart sounds are normal with regular rate and rhythm with an aortic systolic  murmur. Peripheral pulses are 1+ and equal bilaterally without edema. No aortic or femoral bruits. Chest: symmetric with normal excursions and percussion. Breasts: Atrophic w/o palpable abnormal masses.  Abdomen: Flat, soft with bowel sounds active. Nontender, no guarding,  rebound, hernias, masses, or organomegaly.  Lymphatics: Non tender without lymphadenopathy.  Musculoskeletal: Full ROM all peripheral extremitie\s with generalized decrease in muscle power, tone & bulk.  Normal gait. Skin: Warm and dry without rashes, lesions, cyanosis, clubbing or  ecchymosis.  Neuro: Cranial nerves intact, reflexes equal bilaterally. Normal muscle tone, no cerebellar symptoms. Sensation intact. (+) Snout . (+) palmo-mental reflex. Pysch: Alert and oriented x 3, normal affect, Insight and Judgment limited.    Assessment and Plan   1. Labile hypertension  - EKG 12-Lead - Urinalysis, Routine w reflex microscopic - Microalbumin / creatinine urine ratio - CBC with Differential/Platelet - COMPLETE METABOLIC PANEL WITH GFR - Magnesium - TSH  2. Hyperlipidemia, mixed  - EKG 12-Lead - Lipid panel - TSH  3. Abnormal glucose  - EKG 12-Lead - Hemoglobin A1c - Insulin, random  4. Vitamin D deficiency  - VITAMIN D 25 Hydroxy   5. Chronic combined systolic and diastolic CHF (congestive heart failure) (HCC)  - EKG 12-Lead  6. Aortic atherosclerosis ([place]) - CT 09/2020  - EKG 12-Lead - Lipid panel  7. Stage 3a chronic kidney disease (HCC)  - PTH, intact and calcium  8. Screening for colorectal cancer  - POC Hemoccult Bld/Stl   9. Screening for heart disease  -  EKG 12-Lead  10. FHx: heart disease  - EKG 12-Lead  11. Former smoker  - EKG 12-Lead  12. Medication management  - Urinalysis, Routine w reflex microscopic - Microalbumin / creatinine urine ratio - CBC with Differential/Platelet - COMPLETE METABOLIC PANEL WITH GFR - Magnesium - Lipid panel - TSH - Hemoglobin A1c - Insulin, random - VITAMIN D 25 Hydroxy           Patient was counseled in prudent diet to achieve/maintain BMI less than 25 for weight control, BP monitoring, regular exercise and medications. Discussed med's effects and SE's. Screening labs and tests as requested with regular follow-up as recommended.  Strongly encouraged to get MGM. Over 40 minutes of exam, counseling, chart review and high complex critical decision making was performed.   Kirtland Bouchard, MD

## 2022-04-17 NOTE — Progress Notes (Signed)
Pharmacist Visit   Leslie [name]  27 years, Female  DOB: Aug 06, 1926  M: [phone]  __________________________________________________ Chronic Conditions Patient's Chronic Conditions: Hypertension (HTN), Heart Failure, Constipation, Chronic Kidney Disease (CKD), Hyperlipidemia/Dyslipidemia (HLD), Anxiety, Diabetes (DM), Chronic Pain, Other List Other Conditions (separated by comma): Slow transit constipation, Vitamin D deficiency  Summary for PCP:  1. Patient has stopped Lorazepam early this year and has not needed/asked for it. 2. Fall prevention counseling done today. 3. Will review labs after AWV.  Disease Assessments Visit Completed on: 04/16/2022 Did patient bring medications to appointment?: No What source (s) was used to reconcile medications this visit?: Verbal recall from patient/caregiver Subjective: Spoke to patient's son Patrick Jupiter today for CCM initial visit. He is very involved in mother's care. He lives in [place] and other brother lives in [place]. They visit very often. His cousin and niece both live side by side to Ms. Raja and check on her daily. She has visitors 3 times per week. She lives alone and is still driving within a 5 mile radius of her house. She walks daily and enjoys going to lunch every day. She does word find puzzles in her down time. She has had no falls and does very well for her age! Lifestyle habits: Diet: No formal diet. Limits carbs and salt Exercise: Walks her driveway daily Tobacco: None Alcohol: None Caffeine: 1 cup of coffee daily Recreational drugs: None What is the patient's sleep pattern?: No sleep issues How many hours per night does patient typically sleep?: 8+  SDOH: Accountable Health Communities Health-Related Social Needs Screening Tool (BloggerBowl.es) SDOH questions were documented and reviewed (EMR or [employer]) within the past 12 months or since hospitalization?: No What is  your living situation today? (ref #1): I have a steady place to live Think about the place you live. Do you have problems with any of the following? (ref #2): None of the above Within the past 12 months, you worried that your food would run out before you got money to buy more (ref #3): Never true Within the past 12 months, the food you bought just didn't last and you didn't have money to get more (ref #4): Never true In the past 12 months, has lack of reliable transportation kept you from medical appointments, meetings, work or from getting things needed for daily living? (ref #5): No In the past 12 months, has the electric, gas, oil, or water company threatened to shut off services in your home? (ref #6): No How often does anyone, including family and friends, physically hurt you? (ref #7): Never (1) How often does anyone, including family and friends, insult or talk down to you? (ref #8): Never (1) How often does anyone, including friends and family, threaten you with harm? (ref #9): Never (1) How often does anyone, including family and friends, scream or curse at you? (ref #10): Never (1)  Medication Adherence Does the [facility] have access to medication refill history?: Yes Medication adherence rates for STAR metric medications:  Atorvastatin 20mg - 12/06/21 (90DS), 03/06/22 (90DS)  Medication adherence rates for non-STAR metric medications:  Eliquis 2.5mg - 12/12/21 (90DS), 03/12/22 (90DS) Latanoprost 0.005%-  12/19/21 (108DS), 03/19/22 (108DS) Prednisone 1%- 08/23/21 (25DS)  Name and location of Current pharmacy: [facility] Current Rx insurance plan: Other Details: Medicare Are meds synced by current pharmacy?: No Are meds delivered by current pharmacy?: Yes - by mail order pharmacy Would patient benefit from direct intervention of clinical lead in dispensing process to optimize clinical outcomes?: No Are [facility] pharmacy  services available where patient lives?: Yes Is patient disadvantaged to  use [facility]?: Yes Does patient experience delays in picking up medications due to transportation concerns (getting to pharmacy)?: No Medication organization: [facility] Additional Info: Wayne manages medications Assessment:: Adherent  Hypertension (HTN) Most Recent BP: 124/88 Most Recent HR: 97 taken on: 10/19/2021 Care Gap: Need BP documented or last BP 140/90 or higher: Addressed Assessed today?: Yes Goal: <130/80 mmHG Is Patient checking BP at home?: Yes Patient home BP readings are ranging: 110s-120s/70s Has patient experienced hypotension, dizziness, falls or bradycardia?: No We discussed: DASH diet:  following a diet emphasizing fruits and vegetables and low-fat dairy products along with whole grains, fish, poultry, and nuts. Reducing red meats and sugars., Reducing the amount of salt intake to 1500mg /per day., Proper Home BP Measurement, Contacting PCP office for signs and symptoms of high or low blood pressure (hypotension, dizziness, falls, headaches, edema) Assessment:: Controlled Drug: Metoprolol XL 25mg -1 tab QD Pharmacist Assessment: Appropriate, Effective, Safe, Accessible  Hyperlipidemia/Dyslipidemia (HLD) Last Lipid panel on: 10/09/2021 TC (Goal<200): 125 LDL: 44 HDL (Goal>40): 66 TG (Goal<150): 70 ASCVD 10-year risk?is:: N/A due to Age > 47 Assessed today?: No Drug: Atorvastatin 20mg - 1 tab QD Pharmacist Assessment: Appropriate, Effective, Safe, Accessible Plan/Follow up: Review upcoming lipid panel  Chronic kidney disease (CKD) Most Recent GFR: 62 taken on: 10/09/2021 Previous GFR: 52 taken on: 05/17/2021 Most recent microalbumin ratio: 115 tested on: 04/11/2021 Assessed today?: Yes CKD Stage: Stage 2 (GFR 61-90 ml/min) Albuminuria Stage: A2 (30-300) Contributing factors for developing CKD: HTN, Diabetes, Proteinuria Is Patient taking statin medication: Yes Is patient taking ACEi / ARB?: No Reason patient is not taking ACEi / ARB:  Hyperkalemia Renal dose adjustments recommended?: No We discussed: Limiting dietary sodium intake to less than 2000 mg / day, Maintaining blood pressure control, Maintaining blood glucose control, Avoidance of nephrotoxic drugs (NSAIDs) Assessment:: Controlled Drug: none Plan/Follow up: Counseled don proper water intake  Preventative Health Care Gap: Colorectal cancer screening: Patient excluded from population (Age > 88, hx of colorectal cancer or colectomy, hospice services) Care Gap: Breast cancer screening: Patient excluded from population (Age > 11, hx of bilateral mastectomy, frailty, hospice services) Care Gap: Annual Wellness Visit (AWV): Addressed Immunizations needed: Pneumococcal, Zoster, Influenza Additional exercise counseling points. We discussed: incorporating flexibility, balance, and strength training exercises, targeting at least 150 minutes per week of moderate-intensity aerobic exercise. Additional diet counseling points. We discussed: aiming to consume at least 8 cups of water day  Clinical Summary Next CCM Follow Up: 1 year Next AWV: 05/17/22 at 11:00AM Next PCP Visit: 04/18/22 at 11:00AM Attestation Statement:: CCM Services:  This encounter meets complex CCM services and moderate to high medical decision making.  Prior to outreach and patient consent for Chronic Care Management, I referred this patient for services after reviewing the nominated patient list or from a personal encounter with the patient.  I have personally reviewed this encounter including the documentation in this note and have collaborated with the care management provider regarding care management and care coordination activities to include development and update of the comprehensive care plan I am certifying that I agree with the content of this note and encounter as supervising physician. Pharmacy Interventions Pharmacist Interventions discussed: Yes Started Therapy: Immunizations  recommended Monitoring: Routine monitoring Education: Lifestyle modifications  CPP Prep: 5min CPP Televisit: 76min CPP Doc: 64min CPP Care Plan: 83min  Marda Stalker, PharmD Clinical Pharmacist [email or web address] [phone]

## 2022-04-17 NOTE — Patient Instructions (Signed)
Medication Instructions:  Your physician recommends that you continue on your current medications as directed. Please refer to the Current Medication list given to you today.  *If you need a refill on your cardiac medications before your next appointment, please call your pharmacy*  Follow-Up: At [facility], you and your health needs are our priority.  As part of our continuing mission to provide you with exceptional heart care, we have created designated Provider Care Teams.  These Care Teams include your primary Cardiologist (physician) and Advanced Practice Providers (APPs -  Physician Assistants and Nurse Practitioners) who all work together to provide you with the care you need, when you need it.   Your next appointment:   6 month(s)  The format for your next appointment:   In Person  Provider:   Mark Skains, MD     Important Information About Sugar       

## 2022-04-17 NOTE — Addendum Note (Signed)
Addended by: Gaetano Net on: 04/17/2022 12:43 PM   Modules accepted: Orders

## 2022-04-17 NOTE — Progress Notes (Signed)
Cardiology Office Note:    Date:  04/17/2022   ID:  Leslie [name], DOB [birth year], MRN [id number]  PCP:  Unk Pinto, MD   [facility] Providers Cardiologist:  Candee Furbish, MD     Referring MD: Unk Pinto, MD    History of Present Illness:    Leslie [name] is a 88 y.o. female here for follow-up of cardiomyopathy EF 25% with hypertension hyperlipidemia.  Small punctate infarct on brain MRI right frontoparietal vertex and left cerebellum back in March 2022.  Had facial droop.  Stroke was felt to be possible embolic source and Eliquis was recommended until EF improved.  Low-dose beta-blocker was added because of asymptomatic nonsustained ventricular tachycardia.  Her son lives in [place].  Her other son lives in [place].  She enjoys walking around her 2 acre property previously.  Echo in March as well as June 2022 showed EF of 25%.  Personally reviewed.  Overall still doing very well.  Spry for her age.  NYHA class I.  No syncope no bleeding no orthopnea no PND no shortness of breath no chest pain  Past Medical History:  Diagnosis Date   Anxiety disorder    Cardiomyopathy ([place])    Glaucoma    Hearing loss    HTN (hypertension)    Hyperglycemia    Hyperlipidemia    LBBB (left bundle branch block)    Stroke (cerebrum) (HCC)    Vitamin D deficiency     Past Surgical History:  Procedure Laterality Date   GLAUCOMA SURGERY  2013   TONSILLECTOMY      Current Medications: Current Meds  Medication Sig   apixaban (ELIQUIS) 2.5 MG TABS tablet Take 1 tablet (2.5 mg total) by mouth 2 (two) times daily.   atorvastatin (LIPITOR) 20 MG tablet Take 1 tablet (20 mg total) by mouth daily.   latanoprost (XALATAN) 0.005 % ophthalmic solution 1 drop at bedtime.   LORazepam (ATIVAN) 0.5 MG tablet TAKE 1/2-1 TABLET BY MOUTH AT BEDTIME ONLY IF NEEDED FOR SLEEP AND PLEASE TRY TO LIMIT TO 5 DAYS A WEEK TO AVOID ADDICTION AND DEMENTIA   metoprolol succinate (TOPROL-XL) 25 MG 24 hr  tablet Take  1 tablet  Daily  for Heart & BP   prednisoLONE acetate (PRED FORTE) 1 % ophthalmic suspension 1 drop 4 (four) times daily.     Allergies:   Adhesive [tape], Advil [ibuprofen], and Atenolol   Social History   Socioeconomic History   Marital status: Married    Spouse name: Not on file   Number of children: Not on file   Years of education: Not on file   Highest education level: Not on file  Occupational History   Not on file  Tobacco Use   Smoking status: Former    Types: Cigarettes    Quit date: 07/16/1979    Years since quitting: 42.7   Smokeless tobacco: Never  Vaping Use   Vaping Use: Never used  Substance and Sexual Activity   Alcohol use: No    Alcohol/week: 0.0 standard drinks of alcohol   Drug use: No   Sexual activity: Not Currently  Other Topics Concern   Not on file  Social History Narrative   Not on file   Social Determinants of Health   Financial Resource Strain: Not on file  Food Insecurity: Not on file  Transportation Needs: Not on file  Physical Activity: Not on file  Stress: Not on file  Social Connections: Not on file  Family History: The patient's family history includes Diabetes in her father; Heart attack in her brother, brother, and father; Heart disease in her brother and brother; Hypertension in her mother; Stroke in her mother.  ROS:   Please see the history of present illness.     All other systems reviewed and are negative.  EKGs/Labs/Other Studies Reviewed:   Today's EKG shows sinus rhythm 90 left bundle branch block Prior EKG shows left bundle branch block. Recent Labs: 10/09/2021: ALT 12; BUN 23; Creat 0.87; Hemoglobin 12.9; Platelets 496; Potassium 4.7; Sodium 139  Recent Lipid Panel    Component Value Date/Time   CHOL 125 10/09/2021 1136   CHOL 114 01/10/2021 0827   TRIG 70 10/09/2021 1136   HDL 66 10/09/2021 1136   HDL 65 01/10/2021 0827   CHOLHDL 1.9 10/09/2021 1136   VLDL 10 10/01/2020 1801   LDLCALC 44  10/09/2021 1136     Risk Assessment/Calculations:              Physical Exam:    VS:  BP (!) 140/92 (BP Location: Right Leg, Patient Position: Sitting, Cuff Size: Normal)   Pulse 90   Ht 5\' 2"  (1.575 m)   Wt 115 lb 5 oz (52.3 kg)   SpO2 91%   BMI 21.09 kg/m     Wt Readings from Last 3 Encounters:  04/17/22 115 lb 5 oz (52.3 kg)  10/19/21 105 lb 6.4 oz (47.8 kg)  10/09/21 105 lb 6.4 oz (47.8 kg)     GEN:  Well nourished, well developed in no acute distress HEENT: Normal NECK: No JVD; No carotid bruits LYMPHATICS: No lymphadenopathy CARDIAC: RRR, no murmurs, no rubs, gallops RESPIRATORY:  Clear to auscultation without rales, wheezing or rhonchi  ABDOMEN: Soft, non-tender, non-distended MUSCULOSKELETAL:  No edema; No deformity  SKIN: Warm and dry NEUROLOGIC:  Alert and oriented x 3 PSYCHIATRIC:  Normal affect   ASSESSMENT:    1. Chronic combined systolic and diastolic CHF (congestive heart failure) (HCC)   2. Hyperlipidemia, mixed   3. History of embolic stroke     PLAN:    In order of problems listed above:  Chronic combined systolic and diastolic CHF (congestive heart failure) (HCC) EF remains 25%.  Echocardiogram 2022.  She is NYHA class I however.  Asymptomatic.  Doing very well.  No orthopnea walking her property, 2 acres 5 times a week without any difficulty.  She is able to bend down, squat and get back up she showed me.  Here today with her son from [place].  She has another son from [place].  Continue with low-dose Toprol.  No changes made.  No defibrillator.   Hyperlipidemia, mixed Continue with atorvastatin 20 mg.  LDL 44.  Excellent.  No myalgias.  Doing very well.  She has had prior stroke.   History of embolic stroke This is why she is on Eliquis.  Therapy was that with her EF of 25% she may have had an embolic source.  She is not having any bleeding.  Hemoglobin 12.9.  Continue.  Creatinine at last check 0.8.  Excellent.         Medication  Adjustments/Labs and Tests Ordered: Current medicines are reviewed at length with the patient today.  Concerns regarding medicines are outlined above.  No orders of the defined types were placed in this encounter.  No orders of the defined types were placed in this encounter.   Patient Instructions  Medication Instructions:  Your physician recommends that you  continue on your current medications as directed. Please refer to the Current Medication list given to you today.  *If you need a refill on your cardiac medications before your next appointment, please call your pharmacy*   Follow-Up: At [facility], you and your health needs are our priority.  As part of our continuing mission to provide you with exceptional heart care, we have created designated Provider Care Teams.  These Care Teams include your primary Cardiologist (physician) and Advanced Practice Providers (APPs -  Physician Assistants and Nurse Practitioners) who all work together to provide you with the care you need, when you need it.  Your next appointment:   6 month(s)  The format for your next appointment:   In Person  Provider:   Donato Schultz, MD     Important Information About Sugar         Signed, Donato Schultz, MD  04/17/2022 10:19 AM    [facility]

## 2022-04-18 ENCOUNTER — Encounter: Payer: Self-pay | Admitting: Internal Medicine

## 2022-04-18 ENCOUNTER — Ambulatory Visit (INDEPENDENT_AMBULATORY_CARE_PROVIDER_SITE_OTHER): Payer: Medicare Other | Admitting: Internal Medicine

## 2022-04-18 VITALS — BP 130/90 | HR 94 | Temp 97.6°F | Resp 17 | Ht 62.0 in | Wt 111.0 lb

## 2022-04-18 DIAGNOSIS — Z136 Encounter for screening for cardiovascular disorders: Secondary | ICD-10-CM

## 2022-04-18 DIAGNOSIS — I700 Atherosclerosis of aorta: Secondary | ICD-10-CM | POA: Diagnosis not present

## 2022-04-18 DIAGNOSIS — Z23 Encounter for immunization: Secondary | ICD-10-CM | POA: Diagnosis not present

## 2022-04-18 DIAGNOSIS — N1831 Chronic kidney disease, stage 3a: Secondary | ICD-10-CM | POA: Diagnosis not present

## 2022-04-18 DIAGNOSIS — E559 Vitamin D deficiency, unspecified: Secondary | ICD-10-CM

## 2022-04-18 DIAGNOSIS — Z8249 Family history of ischemic heart disease and other diseases of the circulatory system: Secondary | ICD-10-CM

## 2022-04-18 DIAGNOSIS — R0989 Other specified symptoms and signs involving the circulatory and respiratory systems: Secondary | ICD-10-CM

## 2022-04-18 DIAGNOSIS — R7309 Other abnormal glucose: Secondary | ICD-10-CM

## 2022-04-18 DIAGNOSIS — Z79899 Other long term (current) drug therapy: Secondary | ICD-10-CM

## 2022-04-18 DIAGNOSIS — Z87891 Personal history of nicotine dependence: Secondary | ICD-10-CM

## 2022-04-18 DIAGNOSIS — I5042 Chronic combined systolic (congestive) and diastolic (congestive) heart failure: Secondary | ICD-10-CM

## 2022-04-18 DIAGNOSIS — E782 Mixed hyperlipidemia: Secondary | ICD-10-CM | POA: Diagnosis not present

## 2022-04-18 DIAGNOSIS — Z1211 Encounter for screening for malignant neoplasm of colon: Secondary | ICD-10-CM

## 2022-04-19 ENCOUNTER — Other Ambulatory Visit: Payer: Self-pay | Admitting: Internal Medicine

## 2022-04-19 DIAGNOSIS — N3001 Acute cystitis with hematuria: Secondary | ICD-10-CM

## 2022-04-19 LAB — CBC WITH DIFFERENTIAL/PLATELET
Absolute Monocytes: 690 cells/uL (ref 200–950)
Basophils Absolute: 113 cells/uL (ref 0–200)
Basophils Relative: 1.1 %
Eosinophils Absolute: 103 cells/uL (ref 15–500)
Eosinophils Relative: 1 %
HCT: 39.8 % (ref 35.0–45.0)
Hemoglobin: 13.4 g/dL (ref 11.7–15.5)
Lymphs Abs: 876 cells/uL (ref 850–3900)
MCH: 29.5 pg (ref 27.0–33.0)
MCHC: 33.7 g/dL (ref 32.0–36.0)
MCV: 87.5 fL (ref 80.0–100.0)
MPV: 9.5 fL (ref 7.5–12.5)
Monocytes Relative: 6.7 %
Neutro Abs: 8518 cells/uL — ABNORMAL HIGH (ref 1500–7800)
Neutrophils Relative %: 82.7 %
Platelets: 572 10*3/uL — ABNORMAL HIGH (ref 140–400)
RBC: 4.55 10*6/uL (ref 3.80–5.10)
RDW: 14.7 % (ref 11.0–15.0)
Total Lymphocyte: 8.5 %
WBC: 10.3 10*3/uL (ref 3.8–10.8)

## 2022-04-19 LAB — URINALYSIS, ROUTINE W REFLEX MICROSCOPIC
Bilirubin Urine: NEGATIVE
Glucose, UA: NEGATIVE
Hyaline Cast: NONE SEEN /LPF
Nitrite: POSITIVE — AB
Specific Gravity, Urine: 1.02 (ref 1.001–1.035)
WBC, UA: >=60 /HPF — AB (ref 0–5)
pH: 5.5 (ref 5.0–8.0)

## 2022-04-19 LAB — LIPID PANEL
Cholesterol: 146 mg/dL (ref <200)
HDL: 62 mg/dL (ref >=50)
LDL Cholesterol (Calc): 67 mg/dL (calc)
Non-HDL Cholesterol (Calc): 84 mg/dL (calc) (ref <130)
Total CHOL/HDL Ratio: 2.4 (calc) (ref <5.0)
Triglycerides: 89 mg/dL (ref <150)

## 2022-04-19 LAB — PTH, INTACT AND CALCIUM
Calcium: 10.2 mg/dL (ref 8.6–10.4)
PTH: 86 pg/mL — ABNORMAL HIGH (ref 16–77)

## 2022-04-19 LAB — COMPLETE METABOLIC PANEL WITH GFR
AG Ratio: 1.7 (calc) (ref 1.0–2.5)
ALT: 11 U/L (ref 6–29)
AST: 15 U/L (ref 10–35)
Albumin: 4.3 g/dL (ref 3.6–5.1)
Alkaline phosphatase (APISO): 43 U/L (ref 37–153)
BUN/Creatinine Ratio: 22 (calc) (ref 6–22)
BUN: 22 mg/dL (ref 7–25)
CO2: 27 mmol/L (ref 20–32)
Calcium: 10.2 mg/dL (ref 8.6–10.4)
Chloride: 104 mmol/L (ref 98–110)
Creat: 1 mg/dL — ABNORMAL HIGH (ref 0.60–0.95)
Globulin: 2.6 g/dL (calc) (ref 1.9–3.7)
Glucose, Bld: 97 mg/dL (ref 65–99)
Potassium: 5 mmol/L (ref 3.5–5.3)
Sodium: 138 mmol/L (ref 135–146)
Total Bilirubin: 0.8 mg/dL (ref 0.2–1.2)
Total Protein: 6.9 g/dL (ref 6.1–8.1)
eGFR: 53 mL/min/{1.73_m2} — ABNORMAL LOW (ref >=60)

## 2022-04-19 LAB — INSULIN, RANDOM: Insulin: 12.3 u[IU]/mL

## 2022-04-19 LAB — MICROALBUMIN / CREATININE URINE RATIO
Creatinine, Urine: 172 mg/dL (ref 20–275)
Microalb Creat Ratio: 204 mcg/mg creat — ABNORMAL HIGH (ref <30)
Microalb, Ur: 35.1 mg/dL

## 2022-04-19 LAB — HEMOGLOBIN A1C
Hgb A1c MFr Bld: 5.6 % of total Hgb (ref <5.7)
Mean Plasma Glucose: 114 mg/dL
eAG (mmol/L): 6.3 mmol/L

## 2022-04-19 LAB — TSH: TSH: 1.93 mIU/L (ref 0.40–4.50)

## 2022-04-19 LAB — VITAMIN D 25 HYDROXY (VIT D DEFICIENCY, FRACTURES): Vit D, 25-Hydroxy: 36 ng/mL (ref 30–100)

## 2022-04-19 LAB — MAGNESIUM: Magnesium: 2.2 mg/dL (ref 1.5–2.5)

## 2022-04-19 LAB — MICROSCOPIC MESSAGE

## 2022-04-19 MED ORDER — NITROFURANTOIN MONOHYD MACRO 100 MG PO CAPS: ORAL_CAPSULE | ORAL | 0 refills | Status: DC

## 2022-04-19 NOTE — Progress Notes (Signed)
<><><><><><><><><><><><><><><><><><><><><><><><><><><><><><><><><> <><><><><><><><><><><><><><><><><><><><><><><><><><><><><><><><><> -   Test results slightly outside the reference range are not unusual. If there is anything important, I will review this with you,  otherwise it is considered normal test values.  If you have further questions,  please do not hesitate to contact me at the office or via My Chart.  <><><><><><><><><><><><><><><><><><><><><><><><><><><><><><><><><> <><><><><><><><><><><><><><><><><><><><><><><><><><><><><><><><><>  -  U?a  shows Urinary Tract Infection , So  . . . . . . Marland Kitchen    - New Rx for  Macrobid Antibiotic sent to [facility] in [place]   - Please schedule a 1 month follow-up Nurse Visit to ReCheck Urine for Infection  <><><><><><><><><><><><><><><><><><><><><><><><><><><><><><><><><> <><><><><><><><><><><><><><><><><><><><><><><><><><><><><><><><><>  -  PTH hormone regulating Calcium balance is    " OK "  <><><><><><><><><><><><><><><><><><><><><><><><><><><><><><><><><> <><><><><><><><><><><><><><><><><><><><><><><><><><><><><><><><><>  -  Total; Chol = 146   &   LDL Chol = 67   - Both  Excellent   - Very low risk for Heart Attack  / Stroke <><><><><><><><><><><><><><><><><><><><><><><><><><><><><><><><><> <><><><><><><><><><><><><><><><><><><><><><><><><><><><><><><><><>  -  A1c - is Normal  - No Diabetes  - Great ! <><><><><><><><><><><><><><><><><><><><><><><><><><><><><><><><><> <><><><><><><><><><><><><><><><><><><><><><><><><><><><><><><><><>  -  But . . . . . . . . .    Vitamin D = 36 is way way way too Low   !   - Vitamin D goal is between 70-100.   - Please Increase  your Vitamin D to 6,000 units  /day                         ( Can take 3 caps of 2,000 units )    - It is very important as a natural anti-inflammatory and helping the  immune system protect against viral infections, like the Covid-19    helping hair, skin, and nails, as well  as reducing stroke and  heart attack risk.   - It helps your bones and helps with mood.  - It also decreases numerous cancer risks so please  take it as directed.   - Low Vit D is associated with a 200-300% higher risk for  CANCER   and 200-300% higher risk for HEART   ATTACK  &  STROKE.    - It is also associated with higher death rate at younger ages,   autoimmune diseases like Rheumatoid arthritis, Lupus,  Multiple Sclerosis.     - Also many other serious conditions, like depression, Alzheimer's  Dementia, infertility, muscle aches, fatigue, fibromyalgia  <><><><><><><><><><><><><><><><><><><><><><><><><><><><><><><><><> <><><><><><><><><><><><><><><><><><><><><><><><><><><><><><><><><>  -  All Else - CBC - Kidneys - Electrolytes - Liver - Magnesium & Thyroid    - all  Normal / OK  <><><><><><><><><><><><><><><><><><><><><><><><><><><><><><><><><> <><><><><><><><><><><><><><><><><><><><><><><><><><><><><><><><><>

## 2022-04-20 ENCOUNTER — Encounter: Payer: Self-pay | Admitting: Internal Medicine

## 2022-05-14 DIAGNOSIS — N1831 Chronic kidney disease, stage 3a: Secondary | ICD-10-CM | POA: Diagnosis not present

## 2022-05-14 DIAGNOSIS — R0989 Other specified symptoms and signs involving the circulatory and respiratory systems: Secondary | ICD-10-CM | POA: Diagnosis not present

## 2022-05-14 DIAGNOSIS — E782 Mixed hyperlipidemia: Secondary | ICD-10-CM | POA: Diagnosis not present

## 2022-05-17 ENCOUNTER — Ambulatory Visit: Payer: Medicare Other | Admitting: Nurse Practitioner

## 2022-05-20 ENCOUNTER — Ambulatory Visit (INDEPENDENT_AMBULATORY_CARE_PROVIDER_SITE_OTHER): Payer: Medicare Other

## 2022-05-20 VITALS — BP 110/64 | HR 94 | Temp 97.0°F | Wt 110.6 lb

## 2022-05-20 DIAGNOSIS — Z23 Encounter for immunization: Secondary | ICD-10-CM

## 2022-05-20 DIAGNOSIS — N3001 Acute cystitis with hematuria: Secondary | ICD-10-CM

## 2022-05-23 LAB — URINALYSIS, ROUTINE W REFLEX MICROSCOPIC
Bilirubin Urine: NEGATIVE
Glucose, UA: NEGATIVE
Hyaline Cast: NONE SEEN /LPF
Nitrite: POSITIVE — AB
Specific Gravity, Urine: 1.023 (ref 1.001–1.035)
WBC, UA: >=60 /HPF — AB (ref 0–5)
pH: 5.5 (ref 5.0–8.0)

## 2022-05-23 LAB — URINE CULTURE
MICRO NUMBER:: 14149634
SPECIMEN QUALITY:: ADEQUATE

## 2022-05-24 ENCOUNTER — Other Ambulatory Visit: Payer: Self-pay | Admitting: Internal Medicine

## 2022-05-24 DIAGNOSIS — N3001 Acute cystitis with hematuria: Secondary | ICD-10-CM

## 2022-05-24 MED ORDER — CIPROFLOXACIN HCL 250 MG PO TABS: ORAL_TABLET | ORAL | 0 refills | Status: DC

## 2022-05-28 ENCOUNTER — Other Ambulatory Visit: Payer: Self-pay | Admitting: Internal Medicine

## 2022-05-28 DIAGNOSIS — I639 Cerebral infarction, unspecified: Secondary | ICD-10-CM

## 2022-06-04 ENCOUNTER — Other Ambulatory Visit: Payer: Self-pay | Admitting: Adult Health

## 2022-06-04 ENCOUNTER — Other Ambulatory Visit: Payer: Self-pay | Admitting: Internal Medicine

## 2022-06-04 DIAGNOSIS — I639 Cerebral infarction, unspecified: Secondary | ICD-10-CM

## 2022-06-04 DIAGNOSIS — R0989 Other specified symptoms and signs involving the circulatory and respiratory systems: Secondary | ICD-10-CM

## 2022-06-04 MED ORDER — METOPROLOL SUCCINATE ER 25 MG PO TB24: ORAL_TABLET | ORAL | 3 refills | Status: DC

## 2022-06-10 ENCOUNTER — Other Ambulatory Visit: Payer: Self-pay | Admitting: Internal Medicine

## 2022-06-10 DIAGNOSIS — I639 Cerebral infarction, unspecified: Secondary | ICD-10-CM

## 2022-06-10 MED ORDER — ATORVASTATIN CALCIUM 20 MG PO TABS: ORAL_TABLET | ORAL | 3 refills | Status: DC

## 2022-06-24 ENCOUNTER — Ambulatory Visit (INDEPENDENT_AMBULATORY_CARE_PROVIDER_SITE_OTHER): Payer: Medicare Other

## 2022-06-24 DIAGNOSIS — N3001 Acute cystitis with hematuria: Secondary | ICD-10-CM

## 2022-06-24 NOTE — Progress Notes (Signed)
The patient came in for repeat UA/Culture. She reported no issues today and had no complaints.

## 2022-06-25 LAB — URINALYSIS, ROUTINE W REFLEX MICROSCOPIC
Bilirubin Urine: NEGATIVE
Glucose, UA: NEGATIVE
Hgb urine dipstick: NEGATIVE
Ketones, ur: NEGATIVE
Nitrite: NEGATIVE
RBC / HPF: NONE SEEN /HPF (ref 0–2)
Specific Gravity, Urine: 1.022 (ref 1.001–1.035)
pH: <=5 (ref 5.0–8.0)

## 2022-06-25 LAB — URINE CULTURE
MICRO NUMBER:: 14297493
SPECIMEN QUALITY:: ADEQUATE

## 2022-06-25 LAB — MICROSCOPIC MESSAGE

## 2022-06-25 NOTE — Progress Notes (Signed)
<><><><><><><><><><><><><><><><><><><><><><><><><><><><><><><><><> <><><><><><><><><><><><><><><><><><><><><><><><><><><><><><><><><>  -    U/Culture finally returned & is OK - No Infection - No Abx needed !  <><><><><><><><><><><><><><><><><><><><><><><><><><><><><><><><><> <><><><><><><><><><><><><><><><><><><><><><><><><><><><><><><><><>

## 2022-07-23 ENCOUNTER — Telehealth: Payer: Self-pay

## 2022-07-23 NOTE — Telephone Encounter (Signed)
LM-07/23/22-Chart review started. Reviewing OV, Consults, Hospital visits, Labs and medication changes.  Chart review complete.Called and spoke to pts. Son Frisbee and completed a General Review Call. Pt. Has had some congestion and currently taking OTC Mucinex. Advised pt. Son to bring her in for a visit if her symptoms don't improve or if she develops any other symptoms. Pt. Son verbalized understanding and agreed.(22 min.)

## 2022-07-29 ENCOUNTER — Ambulatory Visit (INDEPENDENT_AMBULATORY_CARE_PROVIDER_SITE_OTHER): Payer: Medicare Other | Admitting: Nurse Practitioner

## 2022-07-29 ENCOUNTER — Encounter: Payer: Self-pay | Admitting: Nurse Practitioner

## 2022-07-29 VITALS — BP 132/84 | HR 96 | Temp 97.5°F | Ht 62.0 in | Wt 106.8 lb

## 2022-07-29 DIAGNOSIS — Z66 Do not resuscitate: Secondary | ICD-10-CM

## 2022-07-29 DIAGNOSIS — N1831 Chronic kidney disease, stage 3a: Secondary | ICD-10-CM

## 2022-07-29 DIAGNOSIS — I509 Heart failure, unspecified: Secondary | ICD-10-CM

## 2022-07-29 DIAGNOSIS — E559 Vitamin D deficiency, unspecified: Secondary | ICD-10-CM | POA: Diagnosis not present

## 2022-07-29 DIAGNOSIS — Z0001 Encounter for general adult medical examination with abnormal findings: Secondary | ICD-10-CM | POA: Diagnosis not present

## 2022-07-29 DIAGNOSIS — F419 Anxiety disorder, unspecified: Secondary | ICD-10-CM

## 2022-07-29 DIAGNOSIS — R8271 Bacteriuria: Secondary | ICD-10-CM | POA: Diagnosis not present

## 2022-07-29 DIAGNOSIS — J439 Emphysema, unspecified: Secondary | ICD-10-CM

## 2022-07-29 DIAGNOSIS — Z681 Body mass index (BMI) 19 or less, adult: Secondary | ICD-10-CM

## 2022-07-29 DIAGNOSIS — Z79899 Other long term (current) drug therapy: Secondary | ICD-10-CM

## 2022-07-29 DIAGNOSIS — R6889 Other general symptoms and signs: Secondary | ICD-10-CM | POA: Diagnosis not present

## 2022-07-29 DIAGNOSIS — R0989 Other specified symptoms and signs involving the circulatory and respiratory systems: Secondary | ICD-10-CM | POA: Diagnosis not present

## 2022-07-29 DIAGNOSIS — R7303 Prediabetes: Secondary | ICD-10-CM

## 2022-07-29 DIAGNOSIS — Z0000 Encounter for general adult medical examination without abnormal findings: Secondary | ICD-10-CM

## 2022-07-29 DIAGNOSIS — E782 Mixed hyperlipidemia: Secondary | ICD-10-CM | POA: Diagnosis not present

## 2022-07-29 DIAGNOSIS — I447 Left bundle-branch block, unspecified: Secondary | ICD-10-CM

## 2022-07-29 NOTE — Progress Notes (Addendum)
MEDICARE ANNUAL WELLNESS VISIT AND FOLLOW UP  Assessment:    Diagnoses and all orders for this visit:  Encounter for Medicare annual wellness exam Due annually  Health maintenance reviewed  LBBB (left bundle branch block) Avoid rate controlling drugs, monitor, annual EKG  CHF (HCC) Appears euvolemic Low sodium diet, continue regular exercise Cardiology following  Emphysema (HCC) Denies sx; remote smoker; monitor  Labile hypertension Discussed DASH (Dietary Approaches to Stop Hypertension) DASH diet is lower in sodium than a typical American diet. Cut back on foods that are high in saturated fat, cholesterol, and trans fats. Eat more whole-grain foods, fish, poultry, and nuts Remain active and exercise as tolerated daily.  Monitor BP at home-Call if greater than 130/80.  Check CMP/CBC  CKD IIIa (HCC) Discussed how what you eat and drink can aide in kidney protection. Stay well hydrated. Avoid high salt foods. Avoid NSAIDS. Keep BP and BG well controlled.   Take medications as prescribed. Remain active and exercise as tolerated daily. Maintain weight.  Continue to monitor. Check CMP/GFR/Microablumin   Vitamin D deficiency Continue supplementation for goal of 60-100 Monitor levels  Hx of Prediabetes Education: Reviewed 'ABCs' of diabetes management  Discussed goals to be met and/or maintained include A1C (<7) Blood pressure (<130/80) Cholesterol (LDL <70) Continue Eye Exam yearly  Continue Dental Exam Q6 mo Discussed dietary recommendations Discussed Physical Activity recommendations Foot exam UTD Check A1C   Medication management All medications discussed and reviewed in full. All questions and concerns regarding medications addressed.     Hyperlipidemia No longer treated secondary to age Discussed lifestyle modifications. Recommended diet heavy in fruits and veggies, omega 3's. Decrease consumption of animal meats, cheeses, and dairy  products. Remain active and exercise as tolerated. Continue to monitor. Check lipids/TSH  Anxiety Reviewed relaxation techniques.  Sleep hygiene. Recommended Cognitive Behavioral Therapy (CBT). Recommended mindfulness meditation and exercise.   Encouraged personality growth wand development through coping techniques and problem-solving skills. Limit/Decrease/Monitor drug/alcohol intake.     BMI 19 Discussed appropriate BMI Diet modification. Physical activity. Encouraged/praised to build confidence.  DNR  Unable to locate copy in chart, requested with most UTD advanced directives/living will   Bacteria in urine Monitor UA/Culture Stay well hydrated to keep urinary system well flushed Discussed cranberry supplement  Thrombocytosis Platelet levels trending up on anticoagulation Monitor platelets and refer to Hematology if continues to show elevation.     Orders Placed This Encounter  Procedures   Urine Culture   CBC with Differential/Platelet   COMPLETE METABOLIC PANEL WITH GFR   Lipid panel   Hemoglobin A1c   VITAMIN D 25 Hydroxy (Vit-D Deficiency, Fractures)   Urinalysis, Routine w reflex microscopic    Notify office for further evaluation and treatment, questions or concerns if any reported s/s fail to improve.   The patient was advised to call back or seek an in-person evaluation if any symptoms worsen or if the condition fails to improve as anticipated.   Further disposition pending results of labs. Discussed med's effects and SE's.    I discussed the assessment and treatment plan with the patient. The patient was provided an opportunity to ask questions and all were answered. The patient agreed with the plan and demonstrated an understanding of the instructions.  Discussed med's effects and SE's. Screening labs and tests as requested with regular follow-up as recommended.  I provided 35 minutes of face-to-face time during this encounter including counseling,  chart review, and critical decision making was preformed.  Future Appointments  Date Time Provider Department Center  09/26/2022  9:20 AM Jake Bathe, MD [facility]  10/31/2022 10:30 AM Lucky Cowboy, MD [facility] None  04/23/2023 11:00 AM Lucky Cowboy, MD [facility] None  07/30/2023 11:00 AM Adela Glimpse, NP [facility] None     Plan:   During the course of the visit the patient was educated and counseled about appropriate screening and preventive services including:   Pneumococcal vaccine  Prevnar 13 Influenza vaccine Td vaccine Screening electrocardiogram Bone densitometry screening Colorectal cancer screening Diabetes screening Glaucoma screening Nutrition counseling  Advanced directives: requested   Subjective:  Leslie [name] is a 89 y.o. female who presents for Medicare Annual Wellness Visit and 3 month follow up. She has LBBB (left bundle branch block); Vitamin D deficiency; Hyperlipidemia, mixed; Medication management; Anxiety; Labile hypertension; FHx: heart disease; Former smoker; Abnormal glucose; Body mass index (BMI) of 19.0-19.9 in adult; CKD (chronic kidney disease) stage 3, GFR 30-59 ml/min (HCC); History of embolic stroke; DNR no code (do not resuscitate); Thrombocytosis; Chronic combined systolic and diastolic CHF (congestive heart failure) (HCC); Chronic low back pain without sciatica; Slow transit constipation; Aortic atherosclerosis (HCC) - CT 09/2020; and Pulmonary emphysema (HCC) - per CT 09/2020 on their problem list.  She presents with her son.  Overall she reports feeling well.  She has no new or additional concerns to report today. Son feels as though she is getting her days of the week confused.  He and his brother check on her frequently.  She also has a Administrator, arts that visits with her every other day for one hour.  He has no other concerns at this time.    She completed course of macrobid several weeks back for UTI; she  denies symptoms today including frequency, dysuria, fatigue.    She was admitted 10/01/2020 cerebral embolic CVA (without residual deficits other than mild short term memory loss after inpatient therapy, son and patient note this seems resolved), initially on DAPT transitioned to elequis 2.5 mg BID due to depressed EF -  2D echo showed severely depressed LVEF with akinesis of the septum, inferior and apical walls, hypokinesis elsewhere.  LVEF is 25%, normal RV. Plan to continue NOAC until EF 35%+. Has followed up with cardiology; She had follow up ECHO 12/2020 showing EF remained 20-25%.   She is on long term anticoagulation however unsure if patient is truly taking as directed.  Son shares that he feels she is taking medications as directed.  She has had a trending elevation in her platelet levels despite being on anticoagulation, if truly taking.      She had an updated driving test 72/5366 and was cleared to drive 5 miles for one hour a day.  She continues to do her grocery shopping.  Her sons accompany her to her Dr. Visits.  She is no longer taking ativan 0.5 mg for anxiety.  BMI is Body mass index is 19.53 kg/m., she has been working on diet and exercise. She continues to walk up to 5 days a week. Reports eats 3 meals, tries  finishes plate.  Wt Readings from Last 3 Encounters:  07/29/22 106 lb 12.8 oz (48.4 kg)  06/24/22 110 lb 12.8 oz (50.3 kg)  05/20/22 110 lb 9.6 oz (50.2 kg)    Her blood pressure has been controlled at home (100s-140/80s), today their BP is   She does workout. She denies chest pain, shortness of breath, dizziness.   She has aortic atherosclerosis per CT 09/2020, emphysema  per imaging , remote former smoker quit in the 80s and denies sx, monitoring only.   She is on cholesterol medication (atorvastatin 20 mg daily) and denies myalgias. Her cholesterol is at goal. The cholesterol last visit was:   Lab Results  Component Value Date   CHOL 146 04/18/2022   HDL 62  04/18/2022   LDLCALC 67 04/18/2022   TRIG 89 04/18/2022   CHOLHDL 2.4 04/18/2022    She has been working on diet and exercise for hx prediabetes (rarely eats bread, does have a sweet tooth), and denies foot ulcerations, increased appetite, nausea, paresthesia of the feet, polydipsia, polyuria, visual disturbances, vomiting and weight loss. Last A1C in the office was:  Lab Results  Component Value Date   HGBA1C 5.6 04/18/2022   She does push water intake, at minimum 5 glasses of ? 16 oz. Last GFR: Lab Results  Component Value Date   GFRNONAA 53 (L) 01/08/2021   GFRNONAA 73 10/12/2020   GFRNONAA >60 10/09/2020   She has mild elevated calcium, normal PTH recently Lab Results  Component Value Date   PTH 86 (H) 04/18/2022   CALCIUM 10.2 04/18/2022   CALCIUM 10.2 04/18/2022   CAION 1.32 10/01/2020   Patient is on Vitamin D supplement,  taking ? 3000 IU and WNL at recent check:    Lab Results  Component Value Date   VD25OH 36 04/18/2022        Medication Review: Current Outpatient Medications on File Prior to Visit  Medication Sig Dispense Refill   atorvastatin (LIPITOR) 20 MG tablet Take  1 tablet  Daily for Cholesterol 90 tablet 3   Cholecalciferol (VITAMIN D3) 25 MCG (1000 UT) CAPS Take 3 capsules  (3,000 units) Daily 1 capsule 0   ELIQUIS 2.5 MG TABS tablet TAKE 1 TABLET TWICE A DAY 180 tablet 3   LORazepam (ATIVAN) 0.5 MG tablet TAKE 1/2-1 TABLET BY MOUTH AT BEDTIME ONLY IF NEEDED FOR SLEEP AND PLEASE TRY TO LIMIT TO 5 DAYS A WEEK TO AVOID ADDICTION AND DEMENTIA 30 tablet 0   metoprolol succinate (TOPROL-XL) 25 MG 24 hr tablet Take  1 tablet  Daily  for Heart & BP 90 tablet 3   ciprofloxacin (CIPRO) 250 MG tablet Take  1 tablet  2 x /day  with Food for UTI 20 tablet 0   latanoprost (XALATAN) 0.005 % ophthalmic solution 1 drop at bedtime. (Patient not taking: Reported on 07/29/2022)     No current facility-administered medications on file prior to visit.    Allergies   Allergen Reactions   Adhesive [Tape]    Advil [Ibuprofen]    Atenolol     Current Problems (verified) Patient Active Problem List   Diagnosis Date Noted   Aortic atherosclerosis ([place]) - CT 09/2020 10/11/2020   Pulmonary emphysema ([place]) - per CT 09/2020 10/11/2020   Slow transit constipation    Chronic combined systolic and diastolic CHF (congestive heart failure) ([place])    Chronic low back pain without sciatica    History of embolic stroke 42/70/6237   DNR no code (do not resuscitate) 10/01/2020   Thrombocytosis 10/01/2020   Body mass index (BMI) of 19.0-19.9 in adult 05/15/2020   CKD (chronic kidney disease) stage 3, GFR 30-59 ml/min ([place]) 05/15/2020   Labile hypertension 06/16/2018   FHx: heart disease 06/16/2018   Former smoker 06/16/2018   Abnormal glucose 06/16/2018   Anxiety 03/04/2018   Medication management 04/08/2014   Vitamin D deficiency 07/01/2013   Hyperlipidemia, mixed 07/01/2013  LBBB (left bundle branch block) 02/20/2010    Screening Tests Immunization History  Administered Date(s) Administered   DTaP 05/06/2003   Influenza, High Dose Seasonal PF 05/17/2021, 04/18/2022   PFIZER(Purple Top)SARS-COV-2 Vaccination 09/22/2019, 10/13/2019   PNEUMOCOCCAL CONJUGATE-20 05/20/2022   Pneumococcal Polysaccharide-23 05/06/2003   Zoster, Live 07/16/2007   Preventative care: Last colonoscopy: never, declines any screening  Last mammogram: 2012, declines further mammograms DEXA: declines Last CXR: 2013, former smoker quit in 1981, declines repeat CXR today, denies concerning sx  Prior vaccinations: TD or Tdap: 2004, declines futher due to cost  Influenza: 04/2022 Pneumococcal: 2004 Prevnar13: Declines Shingles/Zostavax: 2009 Covid 19: 2/2, 2021, pfizer   Names of Other Physician/Practitioners you currently use: 1. [facility]- here for primary care 2. Dr. Prudencio Burly, eye doctor, last visit 2022, sees q36m, glaucoma well  controlled with drops 3. Dr.  Aggie Moats, dentist, last visit 2021- goes annually, plans to follow up  Patient Care Team: Unk Pinto, MD as PCP - General (Internal Medicine) Jerline Pain, MD as PCP - Cardiology (Cardiology) Newton Pigg, [facility] (Inactive) as Pharmacist (Pharmacist) Carleene Mains, [facility] as Pharmacist (Pharmacist)  SURGICAL HISTORY She  has a past surgical history that includes Glaucoma surgery (2013) and Tonsillectomy. FAMILY HISTORY Her family history includes Diabetes in her father; Heart attack in her brother, brother, and father; Heart disease in her brother and brother; Hypertension in her mother; Stroke in her mother. SOCIAL HISTORY She  reports that she quit smoking about 43 years ago. Her smoking use included cigarettes. She has never used smokeless tobacco. She reports that she does not drink alcohol and does not use drugs.   MEDICARE WELLNESS OBJECTIVES: Physical activity:   Cardiac risk factors:   Depression/mood screen:      07/29/2022   12:51 PM  Depression screen PHQ 2/9  Decreased Interest 0  Down, Depressed, Hopeless 0  PHQ - 2 Score 0    ADLs:     07/29/2022   10:06 AM 04/20/2022    9:50 PM  In your present state of health, do you have any difficulty performing the following activities:  Hearing? 0 0  Vision? 0 0  Comment Follows with Dr. Prudencio Burly will have f/u in 08/2022   Difficulty concentrating or making decisions? 1 0  Comment Forgetting Days   Walking or climbing stairs? 0 0  Dressing or bathing? 0 0  Doing errands, shopping? 0 0  Comment Passed the test to drive 82/5053 for 5 miles only daylight   Preparing Food and eating ? N   Using the Toilet? N   In the past six months, have you accidently leaked urine? N   Do you have problems with loss of bowel control? N   Managing your Medications? N   Comment [facility]   Managing your Finances? N   Comment Son helps with bills/automatic and mail   Housekeeping or managing your  Housekeeping? N   Comment Has lighted hallways      Cognitive Testing  Alert? Yes  Normal Appearance?Yes  Oriented to person? Yes  Place? Yes   Time? Yes  Recall of three objects?  Yes  Can perform simple calculations? Yes  Displays appropriate judgment?Yes  Can read the correct time from a watch face?Yes  EOL planning: Does Patient Have a Medical Advance Directive?: Yes  Review of Systems  Constitutional:  Negative for malaise/fatigue and weight loss.  HENT:  Negative for hearing loss and tinnitus.   Eyes:  Negative for blurred vision and double vision.  Respiratory:  Negative for cough, sputum production, shortness of breath and wheezing.   Cardiovascular:  Negative for chest pain, palpitations, orthopnea, claudication, leg swelling and PND.  Gastrointestinal:  Negative for abdominal pain, blood in stool, constipation, diarrhea, heartburn, melena, nausea and vomiting.  Genitourinary: Negative.   Musculoskeletal:  Positive for back pain. Negative for falls, joint pain and myalgias.  Skin:  Negative for rash.  Neurological:  Negative for dizziness, tingling, sensory change, weakness and headaches.  Endo/Heme/Allergies:  Negative for polydipsia.  Psychiatric/Behavioral: Negative.  Negative for depression, memory loss, substance abuse and suicidal ideas. The patient is not nervous/anxious and does not have insomnia.   All other systems reviewed and are negative.    Objective:     Today's Vitals   07/29/22 0922  Pulse: 96  Temp: (!) 97.5 F (36.4 C)  SpO2: 99%  Weight: 106 lb 12.8 oz (48.4 kg)  Height: 5\' 2"  (1.575 m)   Body mass index is 19.53 kg/m.  General appearance: alert, no distress, WD/WN, thin elder female HEENT: normocephalic, sclerae anicteric, TMs pearly, nares patent, no discharge or erythema, pharynx normal Oral cavity: MMM, no lesions Neck: supple, no lymphadenopathy, no thyromegaly, no masses Heart: RRR, normal S1, S2, no murmurs Lungs: CTA bilaterally,  no wheezes, rhonchi, or rales Abdomen: +bs, soft, non tender, non distended, no masses, no hepatomegaly, no splenomegaly Musculoskeletal: nontender, no swelling, no obvious deformity. Able to bend down to touch toes and get up without assistance.  Extremities: no edema, no cyanosis, no clubbing Pulses: 2+ symmetric, upper and lower extremities, normal cap refill Neurological: alert, oriented x 3, CN2-12 intact, strength normal upper extremities and lower extremities, sensation normal throughout, DTRs 2+ throughout, no cerebellar signs, gait normal Psychiatric: normal affect, behavior normal, pleasant   Medicare Attestation I have personally reviewed: The patient's medical and social history Their use of alcohol, tobacco or illicit drugs Their current medications and supplements The patient's functional ability including ADLs,fall risks, home safety risks, cognitive, and hearing and visual impairment Diet and physical activities Evidence for depression or mood disorders  The patient's weight, height, BMI, and visual acuity have been recorded in the chart.  I have made referrals, counseling, and provided education to the patient based on review of the above and I have provided the patient with a written personalized care plan for preventive services.     , NP   07/29/2022

## 2022-07-29 NOTE — Patient Instructions (Signed)
Urinary Tract Infection, Adult ?A urinary tract infection (UTI) is an infection of any part of the urinary tract. The urinary tract includes: ?The kidneys. ?The ureters. ?The bladder. ?The urethra. ?These organs make, store, and get rid of pee (urine) in the body. ?What are the causes? ?This infection is caused by germs (bacteria) in your genital area. These germs grow and cause swelling (inflammation) of your urinary tract. ?What increases the risk? ?The following factors may make you more likely to develop this condition: ?Using a small, thin tube (catheter) to drain pee. ?Not being able to control when you pee or poop (incontinence). ?Being female. If you are female, these things can increase the risk: ?Using these methods to prevent pregnancy: ?A medicine that kills sperm (spermicide). ?A device that blocks sperm (diaphragm). ?Having low levels of a female hormone (estrogen). ?Being pregnant. ?You are more likely to develop this condition if: ?You have genes that add to your risk. ?You are sexually active. ?You take antibiotic medicines. ?You have trouble peeing because of: ?A prostate that is bigger than normal, if you are female. ?A blockage in the part of your body that drains pee from the bladder. ?A kidney stone. ?A nerve condition that affects your bladder. ?Not getting enough to drink. ?Not peeing often enough. ?You have other conditions, such as: ?Diabetes. ?A weak disease-fighting system (immune system). ?Sickle cell disease. ?Gout. ?Injury of the spine. ?What are the signs or symptoms? ?Symptoms of this condition include: ?Needing to pee right away. ?Peeing small amounts often. ?Pain or burning when peeing. ?Blood in the pee. ?Pee that smells bad or not like normal. ?Trouble peeing. ?Pee that is cloudy. ?Fluid coming from the vagina, if you are female. ?Pain in the belly or lower back. ?Other symptoms include: ?Vomiting. ?Not feeling hungry. ?Feeling mixed up (confused). This may be the first symptom in  older adults. ?Being tired and grouchy (irritable). ?A fever. ?Watery poop (diarrhea). ?How is this treated? ?Taking antibiotic medicine. ?Taking other medicines. ?Drinking enough water. ?In some cases, you may need to see a specialist. ?Follow these instructions at home: ? ?Medicines ?Take over-the-counter and prescription medicines only as told by your doctor. ?If you were prescribed an antibiotic medicine, take it as told by your doctor. Do not stop taking it even if you start to feel better. ?General instructions ?Make sure you: ?Pee until your bladder is empty. ?Do not hold pee for a long time. ?Empty your bladder after sex. ?Wipe from front to back after peeing or pooping if you are a female. Use each tissue one time when you wipe. ?Drink enough fluid to keep your pee pale yellow. ?Keep all follow-up visits. ?Contact a doctor if: ?You do not get better after 1-2 days. ?Your symptoms go away and then come back. ?Get help right away if: ?You have very bad back pain. ?You have very bad pain in your lower belly. ?You have a fever. ?You have chills. ?You feeling like you will vomit or you vomit. ?Summary ?A urinary tract infection (UTI) is an infection of any part of the urinary tract. ?This condition is caused by germs in your genital area. ?There are many risk factors for a UTI. ?Treatment includes antibiotic medicines. ?Drink enough fluid to keep your pee pale yellow. ?This information is not intended to replace advice given to you by your health care provider. Make sure you discuss any questions you have with your health care provider. ?Document Revised: 02/11/2020 Document Reviewed: 02/11/2020 ?Elsevier Patient Education ?   [place].

## 2022-07-30 LAB — CBC WITH DIFFERENTIAL/PLATELET
Absolute Monocytes: 592 cells/uL (ref 200–950)
Basophils Absolute: 91 cells/uL (ref 0–200)
Basophils Relative: 1 %
Eosinophils Absolute: 64 cells/uL (ref 15–500)
Eosinophils Relative: 0.7 %
HCT: 42.6 % (ref 35.0–45.0)
Hemoglobin: 14.3 g/dL (ref 11.7–15.5)
Lymphs Abs: 664 cells/uL — ABNORMAL LOW (ref 850–3900)
MCH: 29.4 pg (ref 27.0–33.0)
MCHC: 33.6 g/dL (ref 32.0–36.0)
MCV: 87.7 fL (ref 80.0–100.0)
MPV: 9.3 fL (ref 7.5–12.5)
Monocytes Relative: 6.5 %
Neutro Abs: 7690 cells/uL (ref 1500–7800)
Neutrophils Relative %: 84.5 %
Platelets: 706 10*3/uL — ABNORMAL HIGH (ref 140–400)
RBC: 4.86 10*6/uL (ref 3.80–5.10)
RDW: 14.5 % (ref 11.0–15.0)
Total Lymphocyte: 7.3 %
WBC: 9.1 10*3/uL (ref 3.8–10.8)

## 2022-07-30 LAB — COMPLETE METABOLIC PANEL WITH GFR
AG Ratio: 1.5 (calc) (ref 1.0–2.5)
ALT: 10 U/L (ref 6–29)
AST: 16 U/L (ref 10–35)
Albumin: 4.3 g/dL (ref 3.6–5.1)
Alkaline phosphatase (APISO): 61 U/L (ref 37–153)
BUN/Creatinine Ratio: 27 (calc) — ABNORMAL HIGH (ref 6–22)
BUN: 31 mg/dL — ABNORMAL HIGH (ref 7–25)
CO2: 26 mmol/L (ref 20–32)
Calcium: 11 mg/dL — ABNORMAL HIGH (ref 8.6–10.4)
Chloride: 104 mmol/L (ref 98–110)
Creat: 1.16 mg/dL — ABNORMAL HIGH (ref 0.60–0.95)
Globulin: 2.9 g/dL (calc) (ref 1.9–3.7)
Glucose, Bld: 91 mg/dL (ref 65–99)
Potassium: 5.2 mmol/L (ref 3.5–5.3)
Sodium: 140 mmol/L (ref 135–146)
Total Bilirubin: 0.9 mg/dL (ref 0.2–1.2)
Total Protein: 7.2 g/dL (ref 6.1–8.1)
eGFR: 44 mL/min/{1.73_m2} — ABNORMAL LOW (ref >=60)

## 2022-07-30 LAB — URINE CULTURE
MICRO NUMBER:: 14431659
Result:: NO GROWTH
SPECIMEN QUALITY:: ADEQUATE

## 2022-07-30 LAB — HEMOGLOBIN A1C
Hgb A1c MFr Bld: 6 % of total Hgb — ABNORMAL HIGH (ref <5.7)
Mean Plasma Glucose: 126 mg/dL
eAG (mmol/L): 7 mmol/L

## 2022-07-30 LAB — VITAMIN D 25 HYDROXY (VIT D DEFICIENCY, FRACTURES): Vit D, 25-Hydroxy: 28 ng/mL — ABNORMAL LOW (ref 30–100)

## 2022-07-30 LAB — URINALYSIS, ROUTINE W REFLEX MICROSCOPIC
Bacteria, UA: NONE SEEN /HPF
Bilirubin Urine: NEGATIVE
Glucose, UA: NEGATIVE
Hgb urine dipstick: NEGATIVE
Nitrite: NEGATIVE
Specific Gravity, Urine: 1.024 (ref 1.001–1.035)
pH: 5.5 (ref 5.0–8.0)

## 2022-07-30 LAB — LIPID PANEL
Cholesterol: 192 mg/dL (ref <200)
HDL: 64 mg/dL (ref >=50)
LDL Cholesterol (Calc): 110 mg/dL (calc) — ABNORMAL HIGH
Non-HDL Cholesterol (Calc): 128 mg/dL (calc) (ref <130)
Total CHOL/HDL Ratio: 3 (calc) (ref <5.0)
Triglycerides: 89 mg/dL (ref <150)

## 2022-07-30 LAB — MICROSCOPIC MESSAGE

## 2022-08-06 ENCOUNTER — Other Ambulatory Visit: Payer: Self-pay | Admitting: Nurse Practitioner

## 2022-08-06 DIAGNOSIS — D75839 Thrombocytosis, unspecified: Secondary | ICD-10-CM

## 2022-08-06 DIAGNOSIS — Z7901 Long term (current) use of anticoagulants: Secondary | ICD-10-CM

## 2022-08-12 ENCOUNTER — Telehealth: Payer: Self-pay | Admitting: Hematology and Oncology

## 2022-08-12 NOTE — Telephone Encounter (Signed)
Scheduled appt per 1/23 referral. Pt's son is aware of appt date and time. Pt's son is aware to arrive 15 mins prior to appt time and to bring and updated insurance card. Pt's son is aware of appt location.

## 2022-08-14 DIAGNOSIS — R0989 Other specified symptoms and signs involving the circulatory and respiratory systems: Secondary | ICD-10-CM

## 2022-08-14 DIAGNOSIS — N1831 Chronic kidney disease, stage 3a: Secondary | ICD-10-CM

## 2022-08-14 DIAGNOSIS — E782 Mixed hyperlipidemia: Secondary | ICD-10-CM

## 2022-08-20 ENCOUNTER — Inpatient Hospital Stay: Payer: Medicare Other | Attending: Hematology and Oncology | Admitting: Hematology and Oncology

## 2022-08-20 ENCOUNTER — Other Ambulatory Visit: Payer: Self-pay

## 2022-08-20 ENCOUNTER — Inpatient Hospital Stay: Payer: Medicare Other

## 2022-08-20 VITALS — BP 132/81 | HR 87 | Temp 97.5°F | Resp 16 | Wt 107.1 lb

## 2022-08-20 DIAGNOSIS — I10 Essential (primary) hypertension: Secondary | ICD-10-CM | POA: Insufficient documentation

## 2022-08-20 DIAGNOSIS — Z8673 Personal history of transient ischemic attack (TIA), and cerebral infarction without residual deficits: Secondary | ICD-10-CM | POA: Insufficient documentation

## 2022-08-20 DIAGNOSIS — D75839 Thrombocytosis, unspecified: Secondary | ICD-10-CM | POA: Diagnosis not present

## 2022-08-20 DIAGNOSIS — Z7901 Long term (current) use of anticoagulants: Secondary | ICD-10-CM | POA: Diagnosis not present

## 2022-08-20 DIAGNOSIS — Z87891 Personal history of nicotine dependence: Secondary | ICD-10-CM | POA: Insufficient documentation

## 2022-08-20 LAB — CBC WITH DIFFERENTIAL (CANCER CENTER ONLY)
Abs Immature Granulocytes: 0.01 10*3/uL (ref 0.00–0.07)
Basophils Absolute: 0.1 10*3/uL (ref 0.0–0.1)
Basophils Relative: 1 %
Eosinophils Absolute: 0.1 10*3/uL (ref 0.0–0.5)
Eosinophils Relative: 2 %
HCT: 39.3 % (ref 36.0–46.0)
Hemoglobin: 13.4 g/dL (ref 12.0–15.0)
Immature Granulocytes: 0 %
Lymphocytes Relative: 12 %
Lymphs Abs: 0.8 10*3/uL (ref 0.7–4.0)
MCH: 29.6 pg (ref 26.0–34.0)
MCHC: 34.1 g/dL (ref 30.0–36.0)
MCV: 86.8 fL (ref 80.0–100.0)
Monocytes Absolute: 0.7 10*3/uL (ref 0.1–1.0)
Monocytes Relative: 11 %
Neutro Abs: 4.7 10*3/uL (ref 1.7–7.7)
Neutrophils Relative %: 74 %
Platelet Count: 605 10*3/uL — ABNORMAL HIGH (ref 150–400)
RBC: 4.53 MIL/uL (ref 3.87–5.11)
RDW: 15.9 % — ABNORMAL HIGH (ref 11.5–15.5)
WBC Count: 6.4 10*3/uL (ref 4.0–10.5)
nRBC: 0 % (ref 0.0–0.2)

## 2022-08-20 LAB — CMP (CANCER CENTER ONLY)
ALT: 12 U/L (ref 0–44)
AST: 20 U/L (ref 15–41)
Albumin: 3.9 g/dL (ref 3.5–5.0)
Alkaline Phosphatase: 40 U/L (ref 38–126)
Anion gap: 4 — ABNORMAL LOW (ref 5–15)
BUN: 29 mg/dL — ABNORMAL HIGH (ref 8–23)
CO2: 29 mmol/L (ref 22–32)
Calcium: 9.7 mg/dL (ref 8.9–10.3)
Chloride: 106 mmol/L (ref 98–111)
Creatinine: 1.18 mg/dL — ABNORMAL HIGH (ref 0.44–1.00)
GFR, Estimated: 43 mL/min — ABNORMAL LOW (ref >60)
Glucose, Bld: 91 mg/dL (ref 70–99)
Potassium: 3.7 mmol/L (ref 3.5–5.1)
Sodium: 139 mmol/L (ref 135–145)
Total Bilirubin: 0.6 mg/dL (ref 0.3–1.2)
Total Protein: 6.3 g/dL — ABNORMAL LOW (ref 6.5–8.1)

## 2022-08-20 LAB — IRON AND IRON BINDING CAPACITY (CC-WL,HP ONLY)
Iron: 64 ug/dL (ref 28–170)
Saturation Ratios: 20 % (ref 10.4–31.8)
TIBC: 322 ug/dL (ref 250–450)
UIBC: 258 ug/dL (ref 148–442)

## 2022-08-20 LAB — FERRITIN: Ferritin: 36 ng/mL (ref 11–307)

## 2022-08-20 NOTE — Assessment & Plan Note (Signed)
Lab review: 10/09/2021: Platelets 496 04/18/2022: Platelets 572 07/29/2022: Platelets 706  Differential diagnosis 1. Primary thrombocytosis: Related to myeloproliferative disorders of the bone marrow especially essential thrombocytosis and CML. I would like to send for BCR-ABL as well as JAK-2 dictation testings. Patient understands that JAK2 mutation is only present in 50% of essential thrombocytosis so the test is advantageous only if it is positive. If it is negative, it does not rule out. 2. Secondary/reactive thrombocytosis Different causes including infections, inflammation, iron deficiency.  I would like to send out for C-reactive protein, iron studies with ferritin to complete the workup.  Treatment options: 1. If it is primary essential thrombocytosis, treatment would depend on platelet count level as well as history of thrombosis. A. For low risk patients, (platelet counts less than 1000 and no history of blood clots) the treatment would be with aspirin therapy B. for high risk patients(platelet counts greater than 1000/history of blood clot) the treatment would be platelet lowering therapy with aspirin 2. Treatment of secondary thrombocytosis would be to treat underlying cause. There would not be any risk of thrombosis with secondary thrombocytosis.  Return to clinic in 3 weeks to discuss the results of these tests.

## 2022-08-20 NOTE — Progress Notes (Signed)
[place] NOTE  Patient Care Team: Unk Pinto, MD as PCP - General (Internal Medicine) Jerline Pain, MD as PCP - Cardiology (Cardiology) Newton Pigg, [facility] (Inactive) as Pharmacist (Pharmacist) Carleene Mains, [facility] as Pharmacist (Pharmacist)  CHIEF COMPLAINTS/PURPOSE OF CONSULTATION:  Thrombocytosis  HISTORY OF PRESENTING ILLNESS:  Leslie [name] 89 y.o. female is here because of elevated platelet count.  Patient is in excellent health both physically and mentally.  She had a prior history of stroke in 2022.  She is currently on blood thinners with Eliquis.  She was noted to have progressive increase in the platelet counts and because of that she was referred to [place].  No recent infections or illnesses.  She lives alone and takes care of her yard work by herself.  She has a 2 acre property.  I reviewed her records extensively and collaborated the history with the patient.   MEDICAL HISTORY:  Past Medical History:  Diagnosis Date   Anxiety disorder    Cardiomyopathy ([place])    Glaucoma    Hearing loss    HTN (hypertension)    Hyperglycemia    Hyperlipidemia    LBBB (left bundle branch block)    Stroke (cerebrum) (HCC)    Vitamin D deficiency     SURGICAL HISTORY: Past Surgical History:  Procedure Laterality Date   GLAUCOMA SURGERY  2013   TONSILLECTOMY      SOCIAL HISTORY: Social History   Socioeconomic History   Marital status: Married    Spouse name: Not on file   Number of children: Not on file   Years of education: Not on file   Highest education level: Not on file  Occupational History   Not on file  Tobacco Use   Smoking status: Former    Types: Cigarettes    Quit date: 07/16/1979    Years since quitting: 43.1   Smokeless tobacco: Never  Vaping Use   Vaping Use: Never used  Substance and Sexual Activity   Alcohol use: No    Alcohol/week: 0.0 standard drinks of alcohol   Drug use: No   Sexual activity: Not Currently  Other  Topics Concern   Not on file  Social History Narrative   Not on file   Social Determinants of Health   Financial Resource Strain: Not on file  Food Insecurity: Not on file  Transportation Needs: Not on file  Physical Activity: Not on file  Stress: Not on file  Social Connections: Not on file  Intimate Partner Violence: Not on file    FAMILY HISTORY: Family History  Problem Relation Age of Onset   Heart attack Brother    Heart disease Brother    Heart attack Brother    Heart disease Brother    Hypertension Mother    Stroke Mother    Heart attack Father    Diabetes Father     ALLERGIES:  is allergic to adhesive [tape], advil [ibuprofen], and atenolol.  MEDICATIONS:  Current Outpatient Medications  Medication Sig Dispense Refill   atorvastatin (LIPITOR) 20 MG tablet Take  1 tablet  Daily for Cholesterol 90 tablet 3   Cholecalciferol (VITAMIN D3) 25 MCG (1000 UT) CAPS Take 3 capsules  (3,000 units) Daily 1 capsule 0   ELIQUIS 2.5 MG TABS tablet TAKE 1 TABLET TWICE A DAY 180 tablet 3   latanoprost (XALATAN) 0.005 % ophthalmic solution 1 drop at bedtime. (Patient not taking: Reported on 07/29/2022)     LORazepam (ATIVAN) 0.5  MG tablet TAKE 1/2-1 TABLET BY MOUTH AT BEDTIME ONLY IF NEEDED FOR SLEEP AND PLEASE TRY TO LIMIT TO 5 DAYS A WEEK TO AVOID ADDICTION AND DEMENTIA 30 tablet 0   metoprolol succinate (TOPROL-XL) 25 MG 24 hr tablet Take  1 tablet  Daily  for Heart & BP 90 tablet 3   No current facility-administered medications for this visit.    REVIEW OF SYSTEMS:   Constitutional: Denies fevers, chills or abnormal night sweats All other systems were reviewed with the patient and are negative.  PHYSICAL EXAMINATION: ECOG PERFORMANCE STATUS: 1 - Symptomatic but completely ambulatory  Vitals:   08/20/22 1259  BP: 132/81  Pulse: 87  Resp: 16  Temp: (!) 97.5 F (36.4 C)  SpO2: 98%   Filed Weights   08/20/22 1259  Weight: 107 lb 2 oz (48.6 kg)    GENERAL:alert,  no distress and comfortable  LABORATORY DATA:  I have reviewed the data as listed Lab Results  Component Value Date   WBC 9.1 07/29/2022   HGB 14.3 07/29/2022   HCT 42.6 07/29/2022   MCV 87.7 07/29/2022   PLT 706 (H) 07/29/2022   Lab Results  Component Value Date   NA 140 07/29/2022   K 5.2 07/29/2022   CL 104 07/29/2022   CO2 26 07/29/2022    RADIOGRAPHIC STUDIES: I have personally reviewed the radiological reports and agreed with the findings in the report.  ASSESSMENT AND PLAN:  Thrombocytosis Lab review: 10/09/2021: Platelets 496 04/18/2022: Platelets 572 07/29/2022: Platelets 706  Differential diagnosis 1. Primary thrombocytosis: Related to myeloproliferative disorders of the bone marrow especially essential thrombocytosis and CML. I would like to send for BCR-ABL as well as JAK-2 mutation testings. Patient understands that JAK2 mutation is only present in 50% of essential thrombocytosis so the test is advantageous only if it is positive. If it is negative, it does not rule out. 2. Secondary/reactive thrombocytosis Different causes including infections, inflammation, iron deficiency.  I would like to send out for iron studies with ferritin to complete the workup.  Treatment options: 1. If it is primary essential thrombocytosis, treatment would depend on platelet count level as well as history of thrombosis. A. For low risk patients, (platelet counts less than 1000 and no history of blood clots) the treatment would be with aspirin therapy B. for high risk patients(platelet counts greater than 1000/history of blood clot) the treatment would be platelet lowering therapy with aspirin 2. Treatment of secondary thrombocytosis would be to treat underlying cause. There would not be any risk of thrombosis with secondary thrombocytosis.  Return to clinic in 2 weeks to discuss the results of these tests with a telephone visit.     All questions were answered. The patient knows to  call the clinic with any problems, questions or concerns.    Harriette Ohara, MD 08/20/22

## 2022-08-27 ENCOUNTER — Encounter: Payer: Self-pay | Admitting: Hematology and Oncology

## 2022-08-28 LAB — JAK2 (INCLUDING V617F AND EXON 12), MPL,& CALR W/RFL MPN PANEL (NGS)

## 2022-08-29 ENCOUNTER — Encounter: Payer: Self-pay | Admitting: Internal Medicine

## 2022-08-30 ENCOUNTER — Other Ambulatory Visit: Payer: Self-pay | Admitting: Internal Medicine

## 2022-08-30 MED ORDER — QUETIAPINE FUMARATE ER 50 MG PO TB24: ORAL_TABLET | ORAL | 0 refills | Status: DC

## 2022-09-02 NOTE — Progress Notes (Signed)
HEMATOLOGY-ONCOLOGY TELEPHONE VISIT PROGRESS NOTE  I connected with our patient on 09/04/22 at 11:00 AM EST by telephone and verified that I am speaking with the correct person using two identifiers.  I discussed the limitations, risks, security and privacy concerns of performing an evaluation and management service by telephone and the availability of in person appointments.  I also discussed with the patient that there may be a patient responsible charge related to this service. The patient expressed understanding and agreed to proceed.   History of Present Illness: Leslie [name] 89 y.o. female is here because of elevated platelet count.  Patient is in excellent health both physically and mentally.  She had a prior history of stroke in 2022.  She is currently on blood thinners with Eliquis. She presents to the clinic for a telephone follow-up to discuss test results.     REVIEW OF SYSTEMS:   Constitutional: Denies fevers, chills or abnormal weight loss All other systems were reviewed with the patient and are negative. Observations/Objective:     Assessment Plan:  Thrombocytosis Lab review: 10/09/2021: Platelets 496 04/18/2022: Platelets 572 07/29/2022: Platelets 706  08/20/22: Platelets 605, Iron studies: Sat: 20%, Ferritin 36, Cr: 1.18 MPN Panel: CALR mutation  We decided to watch and monitor the platelet count since she does not have any rapidly rising platelet count numbers. Labs and MD visit in 3 months  I discussed the assessment and treatment plan with the patient. The patient was provided an opportunity to ask questions and all were answered. The patient agreed with the plan and demonstrated an understanding of the instructions. The patient was advised to call back or seek an in-person evaluation if the symptoms worsen or if the condition fails to improve as anticipated.   I provided 12 minutes of non-face-to-face time during this encounter.  This includes time for charting and  coordination of care   Harriette Ohara, MD  I Gardiner Coins am acting as a scribe for Dr.Vinay Gudena  I have reviewed the above documentation for accuracy and completeness, and I agree with the above.

## 2022-09-03 NOTE — Assessment & Plan Note (Signed)
Lab review: 10/09/2021: Platelets 496 04/18/2022: Platelets 572 07/29/2022: Platelets 706  08/20/22: Platelets 605, Iron studies: Sat: 20%, Ferritin 36, Cr: 1.18

## 2022-09-04 ENCOUNTER — Inpatient Hospital Stay (HOSPITAL_BASED_OUTPATIENT_CLINIC_OR_DEPARTMENT_OTHER): Payer: Medicare Other | Admitting: Hematology and Oncology

## 2022-09-04 DIAGNOSIS — D75839 Thrombocytosis, unspecified: Secondary | ICD-10-CM | POA: Diagnosis not present

## 2022-09-05 ENCOUNTER — Telehealth: Payer: Self-pay | Admitting: Hematology and Oncology

## 2022-09-05 NOTE — Telephone Encounter (Signed)
Scheduled appointments per 2/21 los. Talked with the patients son and he is aware of the made appointments.

## 2022-09-09 DIAGNOSIS — H401431 Capsular glaucoma with pseudoexfoliation of lens, bilateral, mild stage: Secondary | ICD-10-CM | POA: Diagnosis not present

## 2022-09-17 ENCOUNTER — Other Ambulatory Visit: Payer: Self-pay | Admitting: Internal Medicine

## 2022-09-17 DIAGNOSIS — F419 Anxiety disorder, unspecified: Secondary | ICD-10-CM

## 2022-09-17 MED ORDER — QUETIAPINE FUMARATE ER 50 MG PO TB24: ORAL_TABLET | ORAL | 3 refills | Status: DC

## 2022-09-26 ENCOUNTER — Ambulatory Visit: Payer: Medicare Other | Attending: Cardiology | Admitting: Cardiology

## 2022-09-26 ENCOUNTER — Encounter: Payer: Self-pay | Admitting: Cardiology

## 2022-09-26 VITALS — BP 133/87 | HR 84 | Ht 62.0 in | Wt 110.0 lb

## 2022-09-26 DIAGNOSIS — Z8673 Personal history of transient ischemic attack (TIA), and cerebral infarction without residual deficits: Secondary | ICD-10-CM

## 2022-09-26 DIAGNOSIS — I5042 Chronic combined systolic (congestive) and diastolic (congestive) heart failure: Secondary | ICD-10-CM | POA: Diagnosis not present

## 2022-09-26 DIAGNOSIS — I447 Left bundle-branch block, unspecified: Secondary | ICD-10-CM | POA: Insufficient documentation

## 2022-09-26 NOTE — Patient Instructions (Signed)
Medication Instructions:  The current medical regimen is effective;  continue present plan and medications.  *If you need a refill on your cardiac medications before your next appointment, please call your pharmacy*  Follow-Up: At [facility], you and your health needs are our priority.  As part of our continuing mission to provide you with exceptional heart care, we have created designated Provider Care Teams.  These Care Teams include your primary Cardiologist (physician) and Advanced Practice Providers (APPs -  Physician Assistants and Nurse Practitioners) who all work together to provide you with the care you need, when you need it.  We recommend signing up for the patient portal called "MyChart".  Sign up information is provided on this After Visit Summary.  MyChart is used to connect with patients for Virtual Visits (Telemedicine).  Patients are able to view lab/test results, encounter notes, upcoming appointments, etc.  Non-urgent messages can be sent to your provider as well.   To learn more about what you can do with MyChart, go to [email or web address].    Your next appointment:   6 month(s)  Provider:   Mark Skains, MD     

## 2022-09-26 NOTE — Progress Notes (Signed)
Cardiology Office Note:    Date:  09/26/2022   ID:  Leslie [name], DOB August 29, 1926, MRN [id number]  PCP:  Unk Pinto, MD   [facility] Providers Cardiologist:  Candee Furbish, MD     Referring MD: Unk Pinto, MD    History of Present Illness:    Leslie [name] is a 89 y.o. female here for follow-up of cardiomyopathy EF 25% with hypertension hyperlipidemia.  Small punctate infarct on brain MRI right frontoparietal vertex and left cerebellum back in March 2022.  Had facial droop.  Stroke was felt to be possible embolic source and Eliquis was recommended until EF improved.  Low-dose beta-blocker was added because of asymptomatic nonsustained ventricular tachycardia.  Her son lives in [place].  Her other son lives in [place].  She enjoys walking around her 2 acre property previously.  Echo in March as well as June 2022 showed EF of 25%.  Personally reviewed.  Overall still doing very well.  Spry for her age.  NYHA class I.  No syncope no bleeding no orthopnea no PND no shortness of breath no chest pain  Past Medical History:  Diagnosis Date   Anxiety disorder    Cardiomyopathy ([place])    Glaucoma    Hearing loss    HTN (hypertension)    Hyperglycemia    Hyperlipidemia    LBBB (left bundle branch block)    Stroke (cerebrum) (HCC)    Vitamin D deficiency     Past Surgical History:  Procedure Laterality Date   GLAUCOMA SURGERY  2013   TONSILLECTOMY      Current Medications: Current Meds  Medication Sig   atorvastatin (LIPITOR) 20 MG tablet Take  1 tablet  Daily for Cholesterol   Cholecalciferol (VITAMIN D3) 25 MCG (1000 UT) CAPS Take 3 capsules  (3,000 units) Daily   ELIQUIS 2.5 MG TABS tablet TAKE 1 TABLET TWICE A DAY   LORazepam (ATIVAN) 0.5 MG tablet TAKE 1/2-1 TABLET BY MOUTH AT BEDTIME ONLY IF NEEDED FOR SLEEP AND PLEASE TRY TO LIMIT TO 5 DAYS A WEEK TO AVOID ADDICTION AND DEMENTIA   metoprolol succinate (TOPROL-XL) 25 MG 24 hr tablet Take  1 tablet  Daily  for  Heart & BP   QUEtiapine (SEROQUEL XR) 50 MG TB24 24 hr tablet Take 1 tablet Daily at Lunchtime for Memory & Concentration     Allergies:   Adhesive [tape], Advil [ibuprofen], and Atenolol   Social History   Socioeconomic History   Marital status: Married    Spouse name: Not on file   Number of children: Not on file   Years of education: Not on file   Highest education level: Not on file  Occupational History   Not on file  Tobacco Use   Smoking status: Former    Types: Cigarettes    Quit date: 07/16/1979    Years since quitting: 43.2   Smokeless tobacco: Never  Vaping Use   Vaping Use: Never used  Substance and Sexual Activity   Alcohol use: No    Alcohol/week: 0.0 standard drinks of alcohol   Drug use: No   Sexual activity: Not Currently  Other Topics Concern   Not on file  Social History Narrative   Not on file   Social Determinants of Health   Financial Resource Strain: Not on file  Food Insecurity: Not on file  Transportation Needs: Not on file  Physical Activity: Not on file  Stress: Not on file  Social Connections: Not on file  Family History: The patient's family history includes Diabetes in her father; Heart attack in her brother, brother, and father; Heart disease in her brother and brother; Hypertension in her mother; Stroke in her mother.  ROS:   Please see the history of present illness.     All other systems reviewed and are negative.  EKGs/Labs/Other Studies Reviewed:   Today's EKG shows sinus rhythm 90 left bundle branch block Prior EKG shows left bundle branch block. Recent Labs: 04/18/2022: Magnesium 2.2; TSH 1.93 08/20/2022: ALT 12; BUN 29; Creatinine 1.18; Hemoglobin 13.4; Platelet Count 605; Potassium 3.7; Sodium 139  Recent Lipid Panel    Component Value Date/Time   CHOL 192 07/29/2022 0000   CHOL 114 01/10/2021 0827   TRIG 89 07/29/2022 0000   HDL 64 07/29/2022 0000   HDL 65 01/10/2021 0827   CHOLHDL 3.0 07/29/2022 0000   VLDL 10  10/01/2020 1801   LDLCALC 110 (H) 07/29/2022 0000     Risk Assessment/Calculations:              Physical Exam:    VS:  BP 133/87 (BP Location: Left Arm, Patient Position: Sitting, Cuff Size: Normal)   Pulse 84   Ht '5\' 2"'$  (1.575 m)   Wt 110 lb (49.9 kg)   BMI 20.12 kg/m     Wt Readings from Last 3 Encounters:  09/26/22 110 lb (49.9 kg)  08/20/22 107 lb 2 oz (48.6 kg)  07/29/22 106 lb 12.8 oz (48.4 kg)     GEN:  Well nourished, well developed in no acute distress HEENT: Normal NECK: No JVD; No carotid bruits LYMPHATICS: No lymphadenopathy CARDIAC: RRR, no murmurs, no rubs, gallops RESPIRATORY:  Clear to auscultation without rales, wheezing or rhonchi  ABDOMEN: Soft, non-tender, non-distended MUSCULOSKELETAL:  No edema; No deformity  SKIN: Warm and dry NEUROLOGIC:  Alert and oriented x 3 PSYCHIATRIC:  Normal affect   ASSESSMENT:    1. Chronic combined systolic and diastolic CHF (congestive heart failure) ([place])   2. LBBB (left bundle branch block)   3. History of embolic stroke      PLAN:    In order of problems listed above:  Chronic combined systolic and diastolic CHF (congestive heart failure) (HCC) EF remains 25%.  Echocardiogram 2022.  She is NYHA class I however.  Asymptomatic.  Doing very well.  No orthopnea walking her property, 2 acres 5 times a week without any difficulty.  She has a long circular driveway.  She is able to bend down, squat and get back up she showed me.  Here today with her son from [place].  She has another son from [place].  Continue with low-dose Toprol.  No changes made.  No defibrillator.  She is pleased.   Hyperlipidemia, mixed Continue with atorvastatin 20 mg.  LDL 44 previously, on recent check 110.  Excellent.  No myalgias.  Doing very well.  She has had prior stroke.   History of embolic stroke This is why she is on Eliquis.  Therapy was that with her EF of 25% she may have had an embolic source.  She is not having any  bleeding.  Hemoglobin 12.9.  Continue.  Creatinine at last check 0.8.  Excellent.  She is also seen by hematology.  Notes reviewed.  Platelet counts have been stable.         Medication Adjustments/Labs and Tests Ordered: Current medicines are reviewed at length with the patient today.  Concerns regarding medicines are outlined above.  No orders  of the defined types were placed in this encounter.  No orders of the defined types were placed in this encounter.   Patient Instructions  Medication Instructions:  The current medical regimen is effective;  continue present plan and medications.  *If you need a refill on your cardiac medications before your next appointment, please call your pharmacy*  Follow-Up: At [facility], you and your health needs are our priority.  As part of our continuing mission to provide you with exceptional heart care, we have created designated Provider Care Teams.  These Care Teams include your primary Cardiologist (physician) and Advanced Practice Providers (APPs -  Physician Assistants and Nurse Practitioners) who all work together to provide you with the care you need, when you need it.  We recommend signing up for the patient portal called "MyChart".  Sign up information is provided on this After Visit Summary.  MyChart is used to connect with patients for Virtual Visits (Telemedicine).  Patients are able to view lab/test results, encounter notes, upcoming appointments, etc.  Non-urgent messages can be sent to your provider as well.   To learn more about what you can do with MyChart, go to [email or web address].    Your next appointment:   6 month(s)  Provider:   Candee Furbish, MD        Signed, Candee Furbish, MD  09/26/2022 10:25 AM    [place]

## 2022-10-16 ENCOUNTER — Telehealth: Payer: Self-pay

## 2022-10-16 NOTE — Progress Notes (Signed)
Patient: Leslie [name], [place] DOB: 12/19/1926  HC chart review started. Reviewing office visits, consults, hospital visits, labs and medication changes. Chart review complete. Calling pt to complete fall risk review call. Spoke with pts son, pt is doing well. Recently stared Quetiapine and there has been an excellent improvement in pt. family is currently looking to transition her into a independent living facility.  Fall Risk Review [facility]) Chart Review What recent interventions have been made by any provider to improve the patient's conditions in the last 3 months?: None Has there been any documented recent hospitalizations or ED visits since last visit with Clinical Lead?: No Adherence Review Does the [facility] have access to medication refill data?: No Disease State Questions Able to connect with the Patient?: Yes In the past 12 months, have you fallen?: No Are there any stairs in or around the home?: Yes Are there any stairs with handrails?: Yes Is the home free of loose throw rugs in walkways, pet beds, electrical cords, etc.?: Yes Is there adequate lighting in your home to reduce the risk of falls?: Yes FPO 04/15/23 FWD to CPP for review.  Total time spent: 20 min

## 2022-10-31 ENCOUNTER — Ambulatory Visit: Payer: Medicare Other | Admitting: Internal Medicine

## 2022-11-04 ENCOUNTER — Ambulatory Visit: Payer: Medicare Other | Admitting: Internal Medicine

## 2022-11-11 ENCOUNTER — Encounter: Payer: Self-pay | Admitting: Internal Medicine

## 2022-11-11 NOTE — Patient Instructions (Signed)

## 2022-11-11 NOTE — Progress Notes (Unsigned)
Future Appointments  Date Time Provider Department  11/12/2022 10:30 AM Lucky Cowboy, MD [facility]  11/27/2022                 6 mo ov  9:45 AM Serena Croissant, MD [facility]  04/01/2023  9:00 AM Jake Bathe, MD [facility]  04/23/2023                  cpe 11:00 AM Lucky Cowboy, MD [facility]  07/30/2023                  wellness 11:00 AM Adela Glimpse, NP [facility]    History of Present Illness:       This very nice 89 y.o. WWW presents for 3 month follow up with HTN, HLD, Pre-Diabetes and Vitamin D Deficiency. CT scan in Mar 2022 showed Aortic Atherosclerosis         Patient is treated for HTN  (1980)  & BP has been controlled at home. Today's BP is at goal   - 138/80.  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 Patient has had no complaints of any cardiac type chest pain, palpitations, dyspnea / orthopnea / PND, dizziness, claudication, or dependent edema.        Hyperlipidemia is controlled with diet & meds. Patient denies myalgias or other med SE's. Last Lipids were not at goal :  Lab Results  Component Value Date   CHOL 192 07/29/2022   HDL 64 07/29/2022   LDLCALC 110 (H) 07/29/2022   TRIG 89 07/29/2022   CHOLHDL 3.0 07/29/2022     Also, the patient has history of PreDiabetes (A1c 5.7% /2012)  and has had no symptoms of reactive hypoglycemia, diabetic polys, paresthesias or visual blurring.  Last A1c was not at goal :  Lab Results  Component Value Date   HGBA1C 6.0 (H) 07/29/2022                                                           Further, the patient also has history of Vitamin D Deficiency and supplements vitamin D . Last vitamin D was still very low :  Lab Results  Component Value Date   VD25OH 28 (L) 07/29/2022     Current Outpatient Medications on File Prior to Visit  Medication Sig    atorvastatin ) 20 MG tablet Take  1 tablet  Daily   VITAMIN D 1000 u Take 3 capsules  (3,000 units) Daily   ELIQUIS 2.5 MG TABS tablet TAKE 1 TABLET TWICE A DAY   Lorazepam 0.5 MG tablet TAKE 1/2-1 TABLET  AT BEDTIME ONLY IF NEEDED    metoprolol succinate -XL  25 MG  Take  1 tablet  Daily   QUEtiapine  XR 50 MG  Take 1 tablet Daily     Allergies  Allergen Reactions   Adhesive [Tape]    Advil [Ibuprofen]    Atenolol      PMHx:   Past Medical History:  Diagnosis Date   Anxiety disorder    Cardiomyopathy (HCC)    Glaucoma    Hearing loss    HTN (  hypertension)    Hyperglycemia    Hyperlipidemia    LBBB (left bundle branch block)    Stroke (cerebrum) (HCC)    Vitamin D deficiency      Immunization History  Administered Date(s) Administered   DTaP 05/06/2003   Influenza, High Dose  05/17/2021, 04/18/2022   PFIZER SARS-COV-2 Vacc 09/22/2019, 10/13/2019   PNEUMOCOCCAL CONJUGATE-20 05/20/2022   Pneumococcal -23 05/06/2003   Zoster, Live 07/16/2007     Past Surgical History:  Procedure Laterality Date   GLAUCOMA SURGERY  2013   TONSILLECTOMY       FHx:    Reviewed / unchanged   SHx:    Reviewed / unchanged    Systems Review:  Constitutional: Denies fever, chills, wt changes, headaches, insomnia, fatigue, night sweats, change in appetite. Eyes: Denies redness, blurred vision, diplopia, discharge, itchy, watery eyes.  ENT: Denies discharge, congestion, post nasal drip, epistaxis, sore throat, earache, hearing loss, dental pain, tinnitus, vertigo, sinus pain, snoring.  CV: Denies chest pain, palpitations, irregular heartbeat, syncope, dyspnea, diaphoresis, orthopnea, PND, claudication or edema. Respiratory: denies cough, dyspnea, DOE, pleurisy, hoarseness, laryngitis, wheezing.  Gastrointestinal: Denies dysphagia, odynophagia, heartburn, reflux, water brash, abdominal pain or cramps, nausea, vomiting, bloating, diarrhea, constipation, hematemesis, melena,  hematochezia  or hemorrhoids. Genitourinary: Denies dysuria, frequency, urgency, nocturia, hesitancy, discharge, hematuria or flank pain. Musculoskeletal: Denies arthralgias, myalgias, stiffness, jt. swelling, pain, limping or strain/sprain.  Skin: Denies pruritus, rash, hives, warts, acne, eczema or change in skin lesion(s). Neuro: No weakness, tremor, incoordination, spasms, paresthesia or pain. Psychiatric: Denies confusion, memory loss or sensory loss. Endo: Denies change in weight, skin or hair change.  Heme/Lymph: No excessive bleeding, bruising or enlarged lymph nodes.   Physical Exam  BP 138/80   Pulse 100   Temp 97.8 F (36.6 C)   Resp 17   Ht 5\' 2"  (1.575 m)   Wt 111 lb 9.6 oz (50.6 kg)   SpO2 98%   BMI 20.41 kg/m   Appears  well nourished, well groomed  and in no distress.  Eyes: PERRLA, EOMs, conjunctiva no swelling or erythema. Sinuses: No frontal/maxillary tenderness ENT/Mouth: EAC's clear, TM's nl w/o erythema, bulging. Nares clear w/o erythema, swelling, exudates. Oropharynx clear without erythema or exudates. Oral hygiene is good. Tongue normal, non obstructing. Hearing intact.  Neck: Supple. Thyroid not palpable. Car 2+/2+ without bruits, nodes or JVD. Chest: Respirations nl with BS clear & equal w/o rales, rhonchi, wheezing or stridor.  Cor: Heart sounds normal w/ regular rate and rhythm without sig. murmurs, gallops, clicks or rubs. Peripheral pulses normal and equal  without edema.  Abdomen: Soft & bowel sounds normal. Non-tender w/o guarding, rebound, hernias, masses or organomegaly.  Lymphatics: Unremarkable.  Musculoskeletal: Full ROM all peripheral extremities, joint stability, 5/5 strength and normal gait.  Skin: Warm, dry without exposed rashes, lesions or ecchymosis apparent.  Neuro: Cranial nerves intact, reflexes equal bilaterally. Sensory-motor testing grossly intact. Tendon reflexes grossly intact.  Pysch: Alert & oriented x 3.  Insight and  judgement nl & appropriate. No ideations.   Assessment and Plan:  1. Labile hypertension  - Continue medication, monitor blood pressure at home.  - Continue DASH diet.  Reminder to go to the ER if any CP,  SOB, nausea, dizziness, severe HA, changes vision/speech.    - Magnesium - TSH - COMPLETE METABOLIC PANEL WITH GFR - CBC with Differential/Platelet  2. Hyperlipidemia, mixed  - Continue diet/meds, exercise,& lifestyle modifications.  - Continue monitor periodic cholesterol/liver & renal functions     -  Lipid panel - TSH  3. Abnormal glucose  - Continue diet, exercise  - Lifestyle modifications.  - Monitor appropriate labs    - Insulin, random  4. Vitamin D deficiency  - Continue supplementation.   - VITAMIN D 25 Hydroxy   5. Persistent atrial fibrillation (HCC)   6. Medication management  - Magnesium - Lipid panel - TSH - Hemoglobin A1c - Insulin, random - VITAMIN D 25 Hydroxy - COMPLETE METABOLIC PANEL WITH GFR - CBC with Differential/Platelet          Discussed  regular exercise, BP monitoring, weight control to achieve/maintain BMI less than 25 and discussed med and SE's. Recommended labs to assess /monitor clinical status .  I discussed the assessment and treatment plan with the patient. The patient was provided an opportunity to ask questions and all were answered. The patient agreed with the plan and demonstrated an understanding of the instructions.  I provided over 30 minutes of exam, counseling, chart review and  complex critical decision making.        The patient was advised to call back or seek an in-person evaluation if the symptoms worsen or if the condition fails to improve as anticipated.   Marinus Maw, MD

## 2022-11-12 ENCOUNTER — Ambulatory Visit (INDEPENDENT_AMBULATORY_CARE_PROVIDER_SITE_OTHER): Payer: Medicare Other | Admitting: Internal Medicine

## 2022-11-12 ENCOUNTER — Encounter: Payer: Self-pay | Admitting: Internal Medicine

## 2022-11-12 VITALS — BP 138/80 | HR 100 | Temp 97.8°F | Resp 17 | Ht 62.0 in | Wt 111.6 lb

## 2022-11-12 DIAGNOSIS — E559 Vitamin D deficiency, unspecified: Secondary | ICD-10-CM | POA: Diagnosis not present

## 2022-11-12 DIAGNOSIS — R7309 Other abnormal glucose: Secondary | ICD-10-CM

## 2022-11-12 DIAGNOSIS — I4819 Other persistent atrial fibrillation: Secondary | ICD-10-CM | POA: Diagnosis not present

## 2022-11-12 DIAGNOSIS — N1831 Chronic kidney disease, stage 3a: Secondary | ICD-10-CM | POA: Diagnosis not present

## 2022-11-12 DIAGNOSIS — Z79899 Other long term (current) drug therapy: Secondary | ICD-10-CM

## 2022-11-12 DIAGNOSIS — R0989 Other specified symptoms and signs involving the circulatory and respiratory systems: Secondary | ICD-10-CM

## 2022-11-12 DIAGNOSIS — E782 Mixed hyperlipidemia: Secondary | ICD-10-CM | POA: Diagnosis not present

## 2022-11-13 LAB — COMPLETE METABOLIC PANEL WITH GFR
AG Ratio: 1.6 (calc) (ref 1.0–2.5)
ALT: 12 U/L (ref 6–29)
AST: 16 U/L (ref 10–35)
Albumin: 4.2 g/dL (ref 3.6–5.1)
Alkaline phosphatase (APISO): 47 U/L (ref 37–153)
BUN/Creatinine Ratio: 25 (calc) — ABNORMAL HIGH (ref 6–22)
BUN: 24 mg/dL (ref 7–25)
CO2: 28 mmol/L (ref 20–32)
Calcium: 10 mg/dL (ref 8.6–10.4)
Chloride: 105 mmol/L (ref 98–110)
Creat: 0.96 mg/dL — ABNORMAL HIGH (ref 0.60–0.95)
Globulin: 2.7 g/dL (calc) (ref 1.9–3.7)
Glucose, Bld: 81 mg/dL (ref 65–99)
Potassium: 4.4 mmol/L (ref 3.5–5.3)
Sodium: 140 mmol/L (ref 135–146)
Total Bilirubin: 0.7 mg/dL (ref 0.2–1.2)
Total Protein: 6.9 g/dL (ref 6.1–8.1)
eGFR: 55 mL/min/{1.73_m2} — ABNORMAL LOW (ref >=60)

## 2022-11-13 LAB — CBC WITH DIFFERENTIAL/PLATELET
Absolute Monocytes: 680 cells/uL (ref 200–950)
Basophils Absolute: 126 cells/uL (ref 0–200)
Basophils Relative: 1.5 %
Eosinophils Absolute: 151 cells/uL (ref 15–500)
Eosinophils Relative: 1.8 %
HCT: 38.9 % (ref 35.0–45.0)
Hemoglobin: 12.9 g/dL (ref 11.7–15.5)
Lymphs Abs: 857 cells/uL (ref 850–3900)
MCH: 29.2 pg (ref 27.0–33.0)
MCHC: 33.2 g/dL (ref 32.0–36.0)
MCV: 88 fL (ref 80.0–100.0)
MPV: 9 fL (ref 7.5–12.5)
Monocytes Relative: 8.1 %
Neutro Abs: 6586 cells/uL (ref 1500–7800)
Neutrophils Relative %: 78.4 %
Platelets: 781 10*3/uL — ABNORMAL HIGH (ref 140–400)
RBC: 4.42 10*6/uL (ref 3.80–5.10)
RDW: 14.4 % (ref 11.0–15.0)
Total Lymphocyte: 10.2 %
WBC: 8.4 10*3/uL (ref 3.8–10.8)

## 2022-11-13 LAB — INSULIN, RANDOM: Insulin: 5.8 u[IU]/mL

## 2022-11-13 LAB — LIPID PANEL
Cholesterol: 194 mg/dL (ref <200)
HDL: 68 mg/dL (ref >=50)
LDL Cholesterol (Calc): 108 mg/dL (calc) — ABNORMAL HIGH
Non-HDL Cholesterol (Calc): 126 mg/dL (calc) (ref <130)
Total CHOL/HDL Ratio: 2.9 (calc) (ref <5.0)
Triglycerides: 86 mg/dL (ref <150)

## 2022-11-13 LAB — TSH: TSH: 2.38 mIU/L (ref 0.40–4.50)

## 2022-11-13 LAB — HEMOGLOBIN A1C
Hgb A1c MFr Bld: 5.6 % of total Hgb (ref <5.7)
Mean Plasma Glucose: 114 mg/dL
eAG (mmol/L): 6.3 mmol/L

## 2022-11-13 LAB — VITAMIN D 25 HYDROXY (VIT D DEFICIENCY, FRACTURES): Vit D, 25-Hydroxy: 31 ng/mL (ref 30–100)

## 2022-11-13 LAB — MAGNESIUM: Magnesium: 2.4 mg/dL (ref 1.5–2.5)

## 2022-11-13 NOTE — Progress Notes (Signed)
^<^<^<^<^<^<^<^<^<^<^<^<^<^<^<^<^<^<^<^<^<^<^<^<^<^<^<^<^<^<^<^<^<^<^<^<^ ^>^>^>^>^>^>^>^>^>^>^>>^>^>^>^>^>^>^>^>^>^>^>^>^>^>^>^>^>^>^>^>^>^>^>^>^>  -Test results slightly outside the reference range are not unusual. If there is anything important, I will review this with you,  otherwise it is considered normal test values.  If you have further questions,  please do not hesitate to contact me at the office or via My Chart.   ^<^<^<^<^<^<^<^<^<^<^<^<^<^<^<^<^<^<^<^<^<^<^<^<^<^<^<^<^<^<^<^<^<^<^<^<^ ^>^>^>^>^>^>^>^>^>^>^>^>^>^>^>^>^>^>^>^>^>^>^>^>^>^>^>^>^>^>^>^>^>^>^>^>^  -  Chol =194    and     LDL Chol = 108  - Both slightly elevated    - Recommend a stricter low cholesterol diet   - Cholesterol only comes from animal sources                                                                           '                                                                                  - ie. meat, dairy, egg yolks  - Eat all the vegetables you want.  - Avoid Meat, Avoid Meat,  Avoid Meat                                                                    - especially Red Meat - Beef AND Pork .  - Avoid cheese & dairy - milk & ice cream.     - Cheese is the most concentrated form of trans-fats which                                                                     is the worst thing to clog up our arteries.   - Veggie cheese is OK which can be found in the fresh produce section at                                                                         [employer] or [employer] or [employer]  ^<^<^<^<^<^<^<^<^<^<^<^<^<^<^<^<^<^<^<^<^<^<^<^<^<^<^<^<^<^<^<^<^<^<^<^<^ ^>^>^>^>^>^>^>^>^>^>^>^>^>^>^>^>^>^>^>^>^>^>^>^>^>^>^>^>^>^>^>^>^>^>^>^>^  -                 Kidney functions stable in Stable 3a  & OK  ^<^<^<^<^<^<^<^<^<^<^<^<^<^<^<^<^<^<^<^<^<^<^<^<^<^<^<^<^<^<^<^<^<^<^<^<^ ^>^>^>^>^>^>^>^>^>^>^>^>^>^>^>^>^>^>^>^>^>^>^>^>^>^>^>^>^>^>^>^>^>^>^>^>^  - A1c - Normal - No Diabetes  -  Great ! ^<^<^<^<^<^<^<^<^<^<^<^<^<^<^<^<^<^<^<^<^<^<^<^<^<^<^<^<^<^<^<^<^<^<^<^<^ ^>^>^>^>^>^>^>^>^>^>^>^>^>^>^>^>^>^>^>^>^>^>^>^>^>^>^>^>^>^>^>^>^>^>^>^>^  -  Vitamin D = 31  - Extremely LOW    - Vitamin D goal is between 70-100.   - Please INCREASE your Vitamin D to 5,000 units  / day   - It is very important as a natural anti-inflammatory and helping the                              immune system protect against viral infections, like the Covid-19    helping hair, skin, and nails, as well as reducing stroke and heart attack risk.                  muscle aches, fatigue, fibromyalgia

## 2022-11-21 ENCOUNTER — Telehealth: Payer: Self-pay | Admitting: Hematology and Oncology

## 2022-11-21 NOTE — Telephone Encounter (Signed)
Rescheduled appointments per provider [facility].Talked with the patients son and he is aware of the changes made to the patients upcoming appointments.

## 2022-11-27 ENCOUNTER — Encounter: Payer: Self-pay | Admitting: Internal Medicine

## 2022-11-27 ENCOUNTER — Inpatient Hospital Stay: Payer: Medicare Other

## 2022-11-27 ENCOUNTER — Inpatient Hospital Stay: Payer: Medicare Other | Admitting: Hematology and Oncology

## 2022-12-01 NOTE — Progress Notes (Unsigned)
     Future Appointments  Date Time Provider Department  12/02/2022  9:30 AM Lucky Cowboy, MD [facility]  12/03/2022 10:45 AM Serena Croissant, MD [facility]  02/18/2023  9:30 AM Adela Glimpse, NP [facility]  04/01/2023  9:00 AM Jake Bathe, MD [facility]  07/15/2023 11:00 AM Lucky Cowboy, MD [facility]  07/30/2023 11:00 AM Adela Glimpse, NP [facility]    History of Present Illness:       This very nice 89 y.o. WWW  with HTN, pAfib, HLD, Pre-Diabetes and Vitamin D Deficiency presents with c/o LBP       Current Outpatient Medications on File Prior to Visit  Medication Sig   atorvastatin 20 MG tab Take  1 tablet  Daily for Cholesterol   VITAMIN D 1000 u Take 3 capsules  (3,000 units) Daily   ELIQUIS 2.5 MG TABS  TAKE 1 TABLET TWICE A DAY   LORazepam   0.5 MG tab TAKE 1/2-1 TABLET BY MOUTH AT BEDTIME ONLY IF NEEDED    metoprolol succinate -XL 25 MG  Take  1 tablet  Daily  for Heart & BP   QUEtiapine (SEROQUEL XR) 50 MG TB24 24 hr tablet Take 1 tablet Daily at Lunchtime for Memory & Concentration     Allergies  Allergen Reactions   Adhesive [Tape]    Advil [Ibuprofen]    Atenolol      Problem list She has LBBB (left bundle branch block); Vitamin D deficiency; Hyperlipidemia, mixed; Medication management; Anxiety; Labile hypertension; FHx: heart disease; Former smoker; Abnormal glucose; Body mass index (BMI) of 19.0-19.9 in adult; CKD (chronic kidney disease) stage 3, GFR 30-59 ml/min (HCC); History of embolic stroke; DNR no code (do not resuscitate); Thrombocytosis; Chronic combined systolic and diastolic CHF (congestive heart failure) (HCC); Chronic low back pain without sciatica; Slow transit constipation; Aortic atherosclerosis (HCC) - CT 09/2020; and Pulmonary emphysema (HCC) - per CT 09/2020 on their problem list.   Observations/Objective:  There were no vitals taken for this visit.  HEENT - WNL. Neck - supple.  Chest - Clear equal BS. Cor - Nl HS. RRR w/o  sig MGR. PP 1(+). No edema. MS- FROM w/o deformities.  Gait Nl. Neuro -  Nl w/o focal abnormalities.   Assessment and Plan:      Follow Up Instructions:        I discussed the assessment and treatment plan with the patient. The patient was provided an opportunity to ask questions and all were answered. The patient agreed with the plan and demonstrated an understanding of the instructions.       The patient was advised to call back or seek an in-person evaluation if the symptoms worsen or if the condition fails to improve as anticipated.    Marinus Maw, MD

## 2022-12-02 ENCOUNTER — Ambulatory Visit (HOSPITAL_BASED_OUTPATIENT_CLINIC_OR_DEPARTMENT_OTHER)
Admission: RE | Admit: 2022-12-02 | Discharge: 2022-12-02 | Disposition: A | Discharge status: Home / Self Care | Payer: Medicare Other | Source: Ambulatory Visit | Attending: Internal Medicine | Admitting: Internal Medicine

## 2022-12-02 ENCOUNTER — Encounter: Payer: Self-pay | Admitting: Internal Medicine

## 2022-12-02 ENCOUNTER — Ambulatory Visit (INDEPENDENT_AMBULATORY_CARE_PROVIDER_SITE_OTHER): Payer: Medicare Other | Admitting: Internal Medicine

## 2022-12-02 VITALS — BP 118/70 | HR 86 | Temp 97.8°F | Resp 16 | Ht 62.0 in | Wt 104.0 lb

## 2022-12-02 DIAGNOSIS — M545 Low back pain, unspecified: Secondary | ICD-10-CM

## 2022-12-02 DIAGNOSIS — M8588 Other specified disorders of bone density and structure, other site: Secondary | ICD-10-CM | POA: Diagnosis not present

## 2022-12-02 DIAGNOSIS — M549 Dorsalgia, unspecified: Secondary | ICD-10-CM | POA: Diagnosis not present

## 2022-12-03 ENCOUNTER — Inpatient Hospital Stay: Payer: Medicare Other | Attending: Hematology and Oncology

## 2022-12-03 ENCOUNTER — Other Ambulatory Visit: Payer: Self-pay

## 2022-12-03 ENCOUNTER — Inpatient Hospital Stay (HOSPITAL_BASED_OUTPATIENT_CLINIC_OR_DEPARTMENT_OTHER): Payer: Medicare Other | Admitting: Hematology and Oncology

## 2022-12-03 VITALS — BP 117/75 | HR 79 | Temp 97.9°F | Resp 16 | Wt 104.2 lb

## 2022-12-03 DIAGNOSIS — M545 Low back pain, unspecified: Secondary | ICD-10-CM | POA: Diagnosis not present

## 2022-12-03 DIAGNOSIS — D75839 Thrombocytosis, unspecified: Secondary | ICD-10-CM

## 2022-12-03 DIAGNOSIS — Z7901 Long term (current) use of anticoagulants: Secondary | ICD-10-CM | POA: Insufficient documentation

## 2022-12-03 DIAGNOSIS — Z79899 Other long term (current) drug therapy: Secondary | ICD-10-CM | POA: Insufficient documentation

## 2022-12-03 LAB — CBC WITH DIFFERENTIAL (CANCER CENTER ONLY)
Abs Immature Granulocytes: 0.02 10*3/uL (ref 0.00–0.07)
Basophils Absolute: 0.1 10*3/uL (ref 0.0–0.1)
Basophils Relative: 2 %
Eosinophils Absolute: 0.1 10*3/uL (ref 0.0–0.5)
Eosinophils Relative: 1 %
HCT: 39.4 % (ref 36.0–46.0)
Hemoglobin: 13.4 g/dL (ref 12.0–15.0)
Immature Granulocytes: 0 %
Lymphocytes Relative: 8 %
Lymphs Abs: 0.6 10*3/uL — ABNORMAL LOW (ref 0.7–4.0)
MCH: 29.5 pg (ref 26.0–34.0)
MCHC: 34 g/dL (ref 30.0–36.0)
MCV: 86.8 fL (ref 80.0–100.0)
Monocytes Absolute: 0.5 10*3/uL (ref 0.1–1.0)
Monocytes Relative: 6 %
Neutro Abs: 6.1 10*3/uL (ref 1.7–7.7)
Neutrophils Relative %: 83 %
Platelet Count: 784 10*3/uL — ABNORMAL HIGH (ref 150–400)
RBC: 4.54 MIL/uL (ref 3.87–5.11)
RDW: 15.1 % (ref 11.5–15.5)
WBC Count: 7.4 10*3/uL (ref 4.0–10.5)
nRBC: 0 % (ref 0.0–0.2)

## 2022-12-03 NOTE — Progress Notes (Signed)
Patient Care Team: Lucky Cowboy, MD as PCP - General (Internal Medicine) Jake Bathe, MD as PCP - Cardiology (Cardiology) Charlett Nose, [facility] (Inactive) as Pharmacist (Pharmacist) Sundra Aland, [facility] as Pharmacist (Pharmacist)  DIAGNOSIS:  Encounter Diagnosis  Name Primary?   Thrombocytosis Yes      CHIEF COMPLIANT: elevated platelet counts  INTERVAL HISTORY: Leslie [name] is a 89 y.o. female is here because of elevated platelet count. She presents to the clinic for a follow-up. She complains of back issues, but she is seeing a doctor for it. She says she does enjoy walking. She is still able to her daily activities.  She is using a wheelchair today because she has been having some low back pain issues. She takes blood thinners.  ALLERGIES:  is allergic to adhesive [tape], advil [ibuprofen], and atenolol.  MEDICATIONS:  Current Outpatient Medications  Medication Sig Dispense Refill   atorvastatin (LIPITOR) 20 MG tablet Take  1 tablet  Daily for Cholesterol 90 tablet 3   Cholecalciferol (VITAMIN D3) 25 MCG (1000 UT) CAPS Take 3 capsules  (3,000 units) Daily 1 capsule 0   ELIQUIS 2.5 MG TABS tablet TAKE 1 TABLET TWICE A DAY 180 tablet 3   latanoprost (XALATAN) 0.005 % ophthalmic solution 1 drop at bedtime.     LORazepam (ATIVAN) 0.5 MG tablet TAKE 1/2-1 TABLET BY MOUTH AT BEDTIME ONLY IF NEEDED FOR SLEEP AND PLEASE TRY TO LIMIT TO 5 DAYS A WEEK TO AVOID ADDICTION AND DEMENTIA 30 tablet 0   metoprolol succinate (TOPROL-XL) 25 MG 24 hr tablet Take  1 tablet  Daily  for Heart & BP 90 tablet 3   QUEtiapine (SEROQUEL XR) 50 MG TB24 24 hr tablet Take 1 tablet Daily at Lunchtime for Memory & Concentration 90 tablet 3   No current facility-administered medications for this visit.    PHYSICAL EXAMINATION: ECOG PERFORMANCE STATUS: 1 - Symptomatic but completely ambulatory  Vitals:   12/03/22 1053  BP: 117/75  Pulse: 79  Resp: 16  Temp: 97.9 F (36.6 C)  SpO2: 99%    Filed Weights   12/03/22 1053  Weight: 104 lb 3.2 oz (47.3 kg)      LABORATORY DATA:  I have reviewed the data as listed    Latest Ref Rng & Units 11/12/2022   10:35 AM 08/20/2022    1:35 PM 07/29/2022   12:00 AM  CMP  Glucose 65 - 99 mg/dL 81  91  91   BUN 7 - 25 mg/dL 24  29  31    Creatinine 0.60 - 0.95 mg/dL 1.61  0.96  0.45   Sodium 135 - 146 mmol/L 140  139  140   Potassium 3.5 - 5.3 mmol/L 4.4  3.7  5.2   Chloride 98 - 110 mmol/L 105  106  104   CO2 20 - 32 mmol/L 28  29  26    Calcium 8.6 - 10.4 mg/dL 40.9  9.7  81.1   Total Protein 6.1 - 8.1 g/dL 6.9  6.3  7.2   Total Bilirubin 0.2 - 1.2 mg/dL 0.7  0.6  0.9   Alkaline Phos 38 - 126 U/L  40    AST 10 - 35 U/L 16  20  16    ALT 6 - 29 U/L 12  12  10      Lab Results  Component Value Date   WBC 7.4 12/03/2022   HGB 13.4 12/03/2022   HCT 39.4 12/03/2022   MCV 86.8 12/03/2022  PLT 784 (H) 12/03/2022   NEUTROABS 6.1 12/03/2022    ASSESSMENT & PLAN:  Thrombocytosis Lab review: 10/09/2021: Platelets 496 04/18/2022: Platelets 572 07/29/2022: Platelets 706 08/20/22: Platelets 605, Iron studies: Sat: 20%, Ferritin 36, Cr: 1.18, MPN Panel: CALR mutation 11/12/2022: Platelets 781 12/03/2022: Platelets 784   I discussed the role of platelet reduction therapy in controlling the thrombocytosis. I recommended watchful monitoring until the platelet count goes above 900. Labs and MD visit in 6 months    Orders Placed This Encounter  Procedures   CBC with Differential (Cancer Center Only)    Standing Status:   Future    Standing Expiration Date:   12/03/2023   The patient has a good understanding of the overall plan. she agrees with it. she will call with any problems that may develop before the next visit here. Total time spent: 30 mins including face to face time and time spent for planning, charting and co-ordination of care   Tamsen Meek, MD 12/03/22    I Janan Ridge am acting as a Neurosurgeon for [employer]  I have reviewed the above documentation for accuracy and completeness, and I agree with the above.

## 2022-12-03 NOTE — Assessment & Plan Note (Signed)
Lab review: 10/09/2021: Platelets 496 04/18/2022: Platelets 572 07/29/2022: Platelets 706 08/20/22: Platelets 605, Iron studies: Sat: 20%, Ferritin 36, Cr: 1.18, MPN Panel: CALR mutation 11/12/2022: Platelets 781   I discussed the role of platelet reduction therapy in controlling the thrombocytosis. Recommended starting the patient on anagrelide. Labs and MD visit in 3 months

## 2022-12-05 NOTE — Progress Notes (Signed)
^<^<^<^<^<^<^<^<^<^<^<^<^<^<^<^<^<^<^<^<^<^<^<^<^<^<^<^<^<^<^<^<^<^<^<^<^ ^>^>^>^>^>^>^>^>^>^>^>>^>^>^>^>^>^>^>^>^>^>^>^>^>^>^>^>^>^>^>^>^>^>^>^>^>  -   X-Rays of Lumbar & Thoracis vertebral Spine  does show                              mild compression type fractures of several vertebrae -                                          Which is  like a mild collapse of several  Vertebrae                                                          which is is common in persons  as they age   But Fortunately,   there is no sign of Cancer  in the spinal bones or Vertebrae.   ^<^<^<^<^<^<^<^<^<^<^<^<^<^<^<^<^<^<^<^<^<^<^<^<^<^<^<^<^<^<^<^<^<^<^<^<^ ^>^>^>^>^>^>^>^>^>^>^>^>^>^>^>^>^>^>^>^>^>^>^>^>^>^>^>^>^>^>^>^>^>^>^>^>^

## 2023-02-16 ENCOUNTER — Other Ambulatory Visit: Payer: Self-pay | Admitting: Internal Medicine

## 2023-02-16 MED ORDER — CYCLOBENZAPRINE HCL 5 MG PO TABS: ORAL_TABLET | ORAL | 0 refills | Status: DC

## 2023-02-18 ENCOUNTER — Ambulatory Visit (INDEPENDENT_AMBULATORY_CARE_PROVIDER_SITE_OTHER): Payer: Medicare Other | Admitting: Nurse Practitioner

## 2023-02-18 ENCOUNTER — Encounter: Payer: Self-pay | Admitting: Nurse Practitioner

## 2023-02-18 VITALS — BP 96/68 | HR 80 | Temp 97.7°F | Wt 91.4 lb

## 2023-02-18 DIAGNOSIS — R6889 Other general symptoms and signs: Secondary | ICD-10-CM

## 2023-02-18 DIAGNOSIS — R0989 Other specified symptoms and signs involving the circulatory and respiratory systems: Secondary | ICD-10-CM

## 2023-02-18 DIAGNOSIS — E782 Mixed hyperlipidemia: Secondary | ICD-10-CM

## 2023-02-18 DIAGNOSIS — K591 Functional diarrhea: Secondary | ICD-10-CM

## 2023-02-18 DIAGNOSIS — M545 Low back pain, unspecified: Secondary | ICD-10-CM | POA: Diagnosis not present

## 2023-02-18 DIAGNOSIS — R5383 Other fatigue: Secondary | ICD-10-CM

## 2023-02-18 DIAGNOSIS — N1831 Chronic kidney disease, stage 3a: Secondary | ICD-10-CM

## 2023-02-18 DIAGNOSIS — R531 Weakness: Secondary | ICD-10-CM | POA: Diagnosis not present

## 2023-02-18 DIAGNOSIS — D75839 Thrombocytosis, unspecified: Secondary | ICD-10-CM | POA: Diagnosis not present

## 2023-02-18 DIAGNOSIS — Z79899 Other long term (current) drug therapy: Secondary | ICD-10-CM

## 2023-02-18 DIAGNOSIS — R4189 Other symptoms and signs involving cognitive functions and awareness: Secondary | ICD-10-CM | POA: Diagnosis not present

## 2023-02-18 DIAGNOSIS — R159 Full incontinence of feces: Secondary | ICD-10-CM

## 2023-02-18 DIAGNOSIS — R152 Fecal urgency: Secondary | ICD-10-CM

## 2023-02-18 LAB — CBC WITH DIFFERENTIAL/PLATELET
Absolute Monocytes: 468 cells/uL (ref 200–950)
Basophils Absolute: 72 cells/uL (ref 0–200)
Basophils Relative: 0.8 %
Eosinophils Absolute: 72 cells/uL (ref 15–500)
Eosinophils Relative: 0.8 %
HCT: 38.7 % (ref 35.0–45.0)
Hemoglobin: 12.7 g/dL (ref 11.7–15.5)
Lymphs Abs: 657 cells/uL — ABNORMAL LOW (ref 850–3900)
MCH: 28.7 pg (ref 27.0–33.0)
MCHC: 32.8 g/dL (ref 32.0–36.0)
MCV: 87.4 fL (ref 80.0–100.0)
MPV: 9.4 fL (ref 7.5–12.5)
Monocytes Relative: 5.2 %
Neutro Abs: 7731 cells/uL (ref 1500–7800)
Neutrophils Relative %: 85.9 %
Platelets: 829 10*3/uL — ABNORMAL HIGH (ref 140–400)
RBC: 4.43 10*6/uL (ref 3.80–5.10)
RDW: 15.2 % — ABNORMAL HIGH (ref 11.0–15.0)
Total Lymphocyte: 7.3 %
WBC: 9 10*3/uL (ref 3.8–10.8)

## 2023-02-18 NOTE — Progress Notes (Signed)
FOLLOW UP  Assessment:   Leslie [name] presents today for a follow up.  Diagnoses and all orders for this visit:  I certify that this patient is confined to his/her home and needs  intermittent skilled nursing care, physical therapy and/or speech therapy or continues to need  occupational therapy. The patient is under my care, and I have authorized services on this plan of care  and will periodically review the plan. The patient had a face-to-face encounter with an allowed  provider type on 11/01/xxxx and the encounter was related to the primary reason for home health care.  LBBB (left bundle branch block) Avoid rate controlling drugs, monitor, annual EKG  CHF (HCC) Appears euvolemic Low sodium diet, continue regular exercise Cardiology following  Emphysema (HCC) Denies sx; remote smoker; monitor  Labile hypertension Discussed DASH (Dietary Approaches to Stop Hypertension) DASH diet is lower in sodium than a typical American diet. Cut back on foods that are high in saturated fat, cholesterol, and trans fats. Eat more whole-grain foods, fish, poultry, and nuts Remain active and exercise as tolerated daily.  Monitor BP at home-Call if greater than 130/80.  Check CMP/CBC  CKD IIIa (HCC) Discussed how what you eat and drink can aide in kidney protection. Stay well hydrated. Avoid high salt foods. Avoid NSAIDS. Keep BP and BG well controlled.   Take medications as prescribed. Remain active and exercise as tolerated daily. Maintain weight.  Continue to monitor. Check CMP/GFR/Microablumin  Vitamin D deficiency Continue supplementation for goal of 60-100 Monitor levels  Hx of Prediabetes Education: Reviewed 'ABCs' of diabetes management  Discussed goals to be met and/or maintained include A1C (<7) Blood pressure (<130/80) Cholesterol (LDL <70) Continue Eye Exam yearly  Continue Dental Exam Q6 mo Discussed dietary recommendations Discussed Physical Activity  recommendations Foot exam UTD Check A1C   Medication management All medications discussed and reviewed in full. All questions and concerns regarding medications addressed.     Hyperlipidemia No longer treated secondary to age Discussed lifestyle modifications. Recommended diet heavy in fruits and veggies, omega 3's. Decrease consumption of animal meats, cheeses, and dairy products. Remain active and exercise as tolerated. Continue to monitor. Check lipids/TSH  Anxiety Reviewed relaxation techniques.  Sleep hygiene. Recommended Cognitive Behavioral Therapy (CBT). Recommended mindfulness meditation and exercise.   Encouraged personality growth wand development through coping techniques and problem-solving skills. Limit/Decrease/Monitor drug/alcohol intake.     BMI 19 Discussed appropriate BMI Diet modification. Physical activity. Encouraged/praised to build confidence.  DNR  Unable to locate copy in chart, requested with most UTD advanced directives/living will   Thrombocytosis Platelet levels trending up on anticoagulation Monitor platelets and refer to Hematology if continues to show elevation.    Medication management All medications discussed and reviewed in full. All questions and concerns regarding medications addressed.   Weakness and fatigue/Cognitive decline Refer to home health for review and evaluation of disease and medication management including evaluation of PT/OT services as well and review of rehab/SNF placement for overall improvement in health status.   Orders Placed This Encounter  Procedures   CBC with Differential/Platelet   COMPLETE METABOLIC PANEL WITH GFR   Lipid panel   Notify office for further evaluation and treatment, questions or concerns if any reported s/s fail to improve.   The patient was advised to call back or seek an in-person evaluation if any symptoms worsen or if the condition fails to improve as anticipated.   Further  disposition pending results of labs. Discussed med's effects and SE's.  I discussed the assessment and treatment plan with the patient. The patient was provided an opportunity to ask questions and all were answered. The patient agreed with the plan and demonstrated an understanding of the instructions.  Discussed med's effects and SE's. Screening labs and tests as requested with regular follow-up as recommended.  I provided 25 minutes of face-to-face time during this encounter including counseling, chart review, and critical decision making was preformed.  Future Appointments  Date Time Provider Department Center  04/01/2023  9:00 AM Jake Bathe, MD [facility]  06/05/2023  9:00 AM [facility] None  06/05/2023  9:30 AM Serena Croissant, MD [facility] None  07/15/2023 11:00 AM Lucky Cowboy, MD [facility] None  07/30/2023 11:00 AM Adela Glimpse, NP [facility] None    Subjective:  Leslie [name] is a 89 y.o. female who presents for a 3 month follow up. She has LBBB (left bundle branch block); Vitamin D deficiency; Hyperlipidemia, mixed; Medication management; Anxiety; Labile hypertension; FHx: heart disease; Former smoker; Abnormal glucose; Body mass index (BMI) of 19.0-19.9 in adult; CKD (chronic kidney disease) stage 3, GFR 30-59 ml/min (HCC); History of embolic stroke; DNR no code (do not resuscitate); Thrombocytosis; Chronic combined systolic and diastolic CHF (congestive heart failure) (HCC); Chronic low back pain without sciatica; Slow transit constipation; Aortic atherosclerosis (HCC) - CT 09/2020; and Pulmonary emphysema (HCC) - per CT 09/2020 on their problem list.  She presents with her son today who is concerned about mother's/patients overall fatigued and weakness.  Does not feel as  though patient is remember to eat, shower, take medications as directed.  The patient does states that overall she reports feeling well.  She has no new or additional  concerns to report today. Son feels as though she is getting her days of the week confused.  He and his brother check on her frequently.  She also has a Administrator, arts that visits with her every other day for one hour.  He is interested in discussed rehab placement for overall healthcare maintenance.  He has researched [facility] and would like to move forward with overall evaluation of placement for strength, medication and disease management.  She is following with Dr. Pamelia Hoit for thrombocytosis.  Last seen 11/2022.  She is currently being monitored every 6 months.  Platelet level goal is set at <900. She is on long term anticoagulation however unsure if patient is truly taking as directed.  Son shares that he is usure if she is taking all medications as directed.  She has had a trending elevation in her platelet levels despite being on anticoagulation, if truly taking.      She also Orthopedics for low back pain 12/02/22.  She has a hx of osteopenia.   T12 and L3 compression deformities are new but age indeterminate. Multilevel endplate degenerative changes. Loss of disc space height at L2-3, L4-5 and L5-S1. Facet hypertrophy in the mid and lower lumbar spine. Pectus deformity.   She was admitted 10/01/2020 cerebral embolic CVA (without residual deficits other than mild short term memory loss after inpatient therapy, son and patient note this seems resolved), initially on DAPT transitioned to elequis 2.5 mg BID due to depressed EF -  2D echo showed severely depressed LVEF with akinesis of the septum, inferior and apical walls, hypokinesis elsewhere.  LVEF is 25%, normal RV. Plan to continue NOAC until EF 35%+. Has followed up with cardiology; She had follow up ECHO 12/2020 showing EF remained 20-25%.  BMI is Body mass index is 16.72 kg/m., she has not been working on diet and exercise.  Son feels as though she is forgetting to eat and when consuming foods only eats small portions. Wt  Readings from Last 3 Encounters:  02/18/23 91 lb 6.4 oz (41.5 kg)  12/03/22 104 lb 3.2 oz (47.3 kg)  12/02/22 104 lb (47.2 kg)    Her blood pressure has been controlled at home (100s-140/80s), today their BP is BP: 96/68 She does workout. She denies chest pain, shortness of breath, dizziness.   She has aortic atherosclerosis per CT 09/2020, emphysema per imaging , remote former smoker quit in the 80s and denies sx, monitoring only.   She is on cholesterol medication (atorvastatin 20 mg daily) and denies myalgias. Her cholesterol is at goal. The cholesterol last visit was:   Lab Results  Component Value Date   CHOL 194 11/12/2022   HDL 68 11/12/2022   LDLCALC 108 (H) 11/12/2022   TRIG 86 11/12/2022   CHOLHDL 2.9 11/12/2022    She has been working on diet and exercise for hx prediabetes (rarely eats bread, does have a sweet tooth), and denies foot ulcerations, increased appetite, nausea, paresthesia of the feet, polydipsia, polyuria, visual disturbances, vomiting and weight loss. Last A1C in the office was:  Lab Results  Component Value Date   HGBA1C 5.6 11/12/2022   She does push water intake, at minimum 5 glasses of ? 16 oz. Last GFR: Lab Results  Component Value Date   GFRNONAA 43 (L) 08/20/2022   GFRNONAA 53 (L) 01/08/2021   GFRNONAA 73 10/12/2020   She has mild elevated calcium, normal PTH recently Lab Results  Component Value Date   PTH 86 (H) 04/18/2022   CALCIUM 10.0 11/12/2022   CAION 1.32 10/01/2020   Patient is on Vitamin D supplement,  taking ? 3000 IU and WNL at recent check:    Lab Results  Component Value Date   VD25OH 31 11/12/2022        Medication Review: Current Outpatient Medications on File Prior to Visit  Medication Sig Dispense Refill   atorvastatin (LIPITOR) 20 MG tablet Take  1 tablet  Daily for Cholesterol 90 tablet 3   Cholecalciferol (VITAMIN D3) 25 MCG (1000 UT) CAPS Take 3 capsules  (3,000 units) Daily 1 capsule 0   ELIQUIS 2.5 MG TABS  tablet TAKE 1 TABLET TWICE A DAY 180 tablet 3   latanoprost (XALATAN) 0.005 % ophthalmic solution 1 drop at bedtime.     metoprolol succinate (TOPROL-XL) 25 MG 24 hr tablet Take  1 tablet  Daily  for Heart & BP 90 tablet 3   QUEtiapine (SEROQUEL XR) 50 MG TB24 24 hr tablet Take 1 tablet Daily at Lunchtime for Memory & Concentration 90 tablet 3   cyclobenzaprine (FLEXERIL) 5 MG tablet Take 1/2 to 1 tablet  3 x  /day  for Muscle spasm 90 tablet 0   No current facility-administered medications on file prior to visit.    Allergies  Allergen Reactions   Adhesive [Tape]    Advil [Ibuprofen]    Atenolol     Current Problems (verified) Patient Active Problem List   Diagnosis Date Noted   Aortic atherosclerosis (HCC) - CT 09/2020 10/11/2020   Pulmonary emphysema (HCC) - per CT 09/2020 10/11/2020   Slow transit constipation    Chronic combined systolic and diastolic CHF (congestive heart failure) (HCC)    Chronic low back pain without sciatica    History of  embolic stroke 10/01/2020   DNR no code (do not resuscitate) 10/01/2020   Thrombocytosis 10/01/2020   Body mass index (BMI) of 19.0-19.9 in adult 05/15/2020   CKD (chronic kidney disease) stage 3, GFR 30-59 ml/min (HCC) 05/15/2020   Labile hypertension 06/16/2018   FHx: heart disease 06/16/2018   Former smoker 06/16/2018   Abnormal glucose 06/16/2018   Anxiety 03/04/2018   Medication management 04/08/2014   Vitamin D deficiency 07/01/2013   Hyperlipidemia, mixed 07/01/2013   LBBB (left bundle branch block) 02/20/2010    Screening Tests Immunization History  Administered Date(s) Administered   DTaP 05/06/2003   Influenza, High Dose Seasonal PF 05/17/2021, 04/18/2022   PFIZER(Purple Top)SARS-COV-2 Vaccination 09/22/2019, 10/13/2019   PNEUMOCOCCAL CONJUGATE-20 05/20/2022   Pneumococcal Polysaccharide-23 05/06/2003   Zoster, Live 07/16/2007   Patient Care Team: Lucky Cowboy, MD as PCP - General (Internal Medicine) Jake Bathe, MD as PCP - Cardiology (Cardiology) Charlett Nose, [facility] (Inactive) as Pharmacist (Pharmacist) Sundra Aland, [facility] (Inactive) as Pharmacist (Pharmacist)  SURGICAL HISTORY She  has a past surgical history that includes Glaucoma surgery (2013) and Tonsillectomy. FAMILY HISTORY Her family history includes Diabetes in her father; Heart attack in her brother, brother, and father; Heart disease in her brother and brother; Hypertension in her mother; Stroke in her mother. SOCIAL HISTORY She  reports that she quit smoking about 43 years ago. Her smoking use included cigarettes. She has never used smokeless tobacco. She reports that she does not drink alcohol and does not use drugs.  Review of Systems  Constitutional:  Negative for malaise/fatigue and weight loss.  HENT:  Negative for hearing loss and tinnitus.   Eyes:  Negative for blurred vision and double vision.  Respiratory:  Negative for cough, sputum production, shortness of breath and wheezing.   Cardiovascular:  Negative for chest pain, palpitations, orthopnea, claudication, leg swelling and PND.  Gastrointestinal:  Negative for abdominal pain, blood in stool, constipation, diarrhea, heartburn, melena, nausea and vomiting.  Genitourinary: Negative.   Musculoskeletal:  Positive for back pain. Negative for falls, joint pain and myalgias.  Skin:  Negative for rash.  Neurological:  Negative for dizziness, tingling, sensory change, weakness and headaches.  Endo/Heme/Allergies:  Negative for polydipsia.  Psychiatric/Behavioral: Negative.  Negative for depression, memory loss, substance abuse and suicidal ideas. The patient is not nervous/anxious and does not have insomnia.   All other systems reviewed and are negative.    Objective:     Today's Vitals   02/18/23 0941  BP: 96/68  Pulse: 80  Temp: 97.7 F (36.5 C)  SpO2: 97%  Weight: 91 lb 6.4 oz (41.5 kg)   Body mass index is 16.72 kg/m.  General appearance: alert, no  distress, WD/WN, thin elder female HEENT: normocephalic, sclerae anicteric, TMs pearly, nares patent, no discharge or erythema, pharynx normal Oral cavity: MMM, no lesions Neck: supple, no lymphadenopathy, no thyromegaly, no masses Heart: RRR, normal S1, S2, no murmurs Lungs: CTA bilaterally, no wheezes, rhonchi, or rales Abdomen: +bs, soft, non tender, non distended, no masses, no hepatomegaly, no splenomegaly Musculoskeletal: nontender, no swelling, no obvious deformity. Able to bend down to touch toes and get up without assistance.  Extremities: no edema, no cyanosis, no clubbing Pulses: 2+ symmetric, upper and lower extremities, normal cap refill Neurological: alert, oriented x 3, CN2-12 intact, strength normal upper extremities and lower extremities, sensation normal throughout, DTRs 2+ throughout, no cerebellar signs, gait normal Psychiatric: normal affect, behavior normal, pleasant   Medicare Attestation I have personally  reviewed: The patient's medical and social history Their use of alcohol, tobacco or illicit drugs Their current medications and supplements The patient's functional ability including ADLs,fall risks, home safety risks, cognitive, and hearing and visual impairment Diet and physical activities Evidence for depression or mood disorders  The patient's weight, height, BMI, and visual acuity have been recorded in the chart.  I have made referrals, counseling, and provided education to the patient based on review of the above and I have provided the patient with a written personalized care plan for preventive services.     Adela Glimpse, NP   02/18/2023

## 2023-02-23 NOTE — Patient Instructions (Signed)
Malnutrition, Adult Malnutrition is a group of symptoms that affects adults, including the elderly. These symptoms include eating too little and losing weight. People who have this may not want to be with friends, or they may not want to eat or drink. This condition is not a normal part of getting older. What are the causes? A disease, such as dementia, diabetes, cancer, or lung disease. A health problem, such as a vitamin deficiency or a heart problem. A disorder, such as sadness (depression). A disability. Medicines. Having tooth or mouth problems. Neglect or being treated badly. In some cases, the cause may not be known. What are the signs or symptoms? Loss of more than 5% of your body weight. Being more tired than normal after an activity. Having trouble getting up after sitting. Not feeling hungry. Not getting out of bed. Not wanting to do your normal activities. Sadness. Getting infections often. Bedsores. Taking a long time to get better after an infection, injury, or a surgery. Weakness. How is this treated? Treatment for this condition depends on the cause. It may be treated by: Treating a disease or disorder that is causing symptoms. Having talk therapy or taking medicine to treat sadness. Eating better, such as eating more often or taking nutritional supplements. Changing or stopping a medicine. Having physical or occupational therapy. It often takes a team of doctors to find the right treatment. Follow these instructions at home:  Take over-the-counter and prescription medicines only as told by your doctor. Eat a healthy diet. Make sure that you eat enough. Ask your doctor how much you should eat. Be active. Do exercises that make you stronger (strength training). A physical therapist can help to set up a program that fits you. Make sure that you are safe at home. Have a plan for what to do if you cannot make decisions for yourself. Contact a doctor if: You are not  able to eat well. You are not able to move around. You feel very sad. You feel very hopeless. Get help right away if: You think about ending your life. You cannot eat or drink. You do not get out of bed. Staying at home is not safe. You have a fever. Get help right away if you feel like you may hurt yourself or others, or have thoughts about taking your own life. Go to your nearest emergency room or: Call 911. Call the National Suicide Prevention Lifeline at (938) 436-0913 or 988. This is open 24 hours a day. Text the Crisis Text Line at 7603081099. Summary Malnutrition is a group of symptoms that affect adults, including the elderly. Symptoms include eating too little and losing weight. Take all medicines only as told by your doctor. Eat a healthy diet. Make sure that you eat enough. Ask your doctor how much you should eat. Be active. Do exercises that make you stronger. A physical therapist can help to set up a program that is right for you. This information is not intended to replace advice given to you by your health care provider. Make sure you discuss any questions you have with your health care provider. Document Revised: 03/16/2021 Document Reviewed: 03/16/2021 Elsevier Patient Education  2024 [employer].

## 2023-02-24 ENCOUNTER — Telehealth: Payer: Self-pay

## 2023-02-24 NOTE — Telephone Encounter (Signed)
-----   Message from [facility] sent at 02/19/2023  2:51 PM EDT ----- Regarding: F2F Placement Form I was informed that a Face to Face from was being faxed from [facility] yesterday to complete for placement but I have not yet seen anything come through.    Please let me know if they have sent and if not, please reach out to request a form. Thanks

## 2023-02-24 NOTE — Telephone Encounter (Signed)
[facility] will be faxing [place] a form tomorrow, 08/13

## 2023-02-25 ENCOUNTER — Other Ambulatory Visit: Payer: Self-pay

## 2023-02-25 ENCOUNTER — Emergency Department (HOSPITAL_COMMUNITY): Payer: Medicare Other

## 2023-02-25 ENCOUNTER — Observation Stay (HOSPITAL_COMMUNITY)
Admission: EM | Admit: 2023-02-25 | Discharge: 2023-02-28 | Disposition: A | Discharge status: Home / Self Care | Payer: Medicare Other | Attending: Family Medicine | Admitting: Family Medicine

## 2023-02-25 DIAGNOSIS — M549 Dorsalgia, unspecified: Secondary | ICD-10-CM | POA: Insufficient documentation

## 2023-02-25 DIAGNOSIS — Z7901 Long term (current) use of anticoagulants: Secondary | ICD-10-CM | POA: Diagnosis not present

## 2023-02-25 DIAGNOSIS — R9431 Abnormal electrocardiogram [ECG] [EKG]: Secondary | ICD-10-CM | POA: Diagnosis not present

## 2023-02-25 DIAGNOSIS — R2681 Unsteadiness on feet: Secondary | ICD-10-CM | POA: Diagnosis not present

## 2023-02-25 DIAGNOSIS — K5289 Other specified noninfective gastroenteritis and colitis: Principal | ICD-10-CM

## 2023-02-25 DIAGNOSIS — D696 Thrombocytopenia, unspecified: Secondary | ICD-10-CM | POA: Insufficient documentation

## 2023-02-25 DIAGNOSIS — Z79899 Other long term (current) drug therapy: Secondary | ICD-10-CM | POA: Insufficient documentation

## 2023-02-25 DIAGNOSIS — Z87891 Personal history of nicotine dependence: Secondary | ICD-10-CM | POA: Diagnosis not present

## 2023-02-25 DIAGNOSIS — N1832 Chronic kidney disease, stage 3b: Secondary | ICD-10-CM | POA: Diagnosis not present

## 2023-02-25 DIAGNOSIS — E876 Hypokalemia: Secondary | ICD-10-CM | POA: Insufficient documentation

## 2023-02-25 DIAGNOSIS — I5020 Unspecified systolic (congestive) heart failure: Secondary | ICD-10-CM | POA: Insufficient documentation

## 2023-02-25 DIAGNOSIS — R197 Diarrhea, unspecified: Secondary | ICD-10-CM | POA: Diagnosis not present

## 2023-02-25 DIAGNOSIS — I4821 Permanent atrial fibrillation: Secondary | ICD-10-CM | POA: Insufficient documentation

## 2023-02-25 DIAGNOSIS — Z9181 History of falling: Secondary | ICD-10-CM | POA: Diagnosis not present

## 2023-02-25 DIAGNOSIS — N1831 Chronic kidney disease, stage 3a: Secondary | ICD-10-CM | POA: Diagnosis not present

## 2023-02-25 DIAGNOSIS — N179 Acute kidney failure, unspecified: Secondary | ICD-10-CM | POA: Diagnosis not present

## 2023-02-25 DIAGNOSIS — R000 Tachycardia, unspecified: Secondary | ICD-10-CM | POA: Diagnosis not present

## 2023-02-25 DIAGNOSIS — Z1152 Encounter for screening for COVID-19: Secondary | ICD-10-CM | POA: Insufficient documentation

## 2023-02-25 DIAGNOSIS — R0902 Hypoxemia: Secondary | ICD-10-CM | POA: Diagnosis not present

## 2023-02-25 DIAGNOSIS — K529 Noninfective gastroenteritis and colitis, unspecified: Secondary | ICD-10-CM | POA: Diagnosis not present

## 2023-02-25 DIAGNOSIS — Z90710 Acquired absence of both cervix and uterus: Secondary | ICD-10-CM | POA: Diagnosis not present

## 2023-02-25 DIAGNOSIS — I130 Hypertensive heart and chronic kidney disease with heart failure and stage 1 through stage 4 chronic kidney disease, or unspecified chronic kidney disease: Secondary | ICD-10-CM | POA: Diagnosis not present

## 2023-02-25 DIAGNOSIS — Z8673 Personal history of transient ischemic attack (TIA), and cerebral infarction without residual deficits: Secondary | ICD-10-CM | POA: Diagnosis not present

## 2023-02-25 DIAGNOSIS — R6889 Other general symptoms and signs: Secondary | ICD-10-CM | POA: Diagnosis not present

## 2023-02-25 DIAGNOSIS — M6281 Muscle weakness (generalized): Secondary | ICD-10-CM | POA: Diagnosis not present

## 2023-02-25 DIAGNOSIS — I517 Cardiomegaly: Secondary | ICD-10-CM | POA: Diagnosis not present

## 2023-02-25 DIAGNOSIS — K5641 Fecal impaction: Secondary | ICD-10-CM | POA: Diagnosis not present

## 2023-02-25 DIAGNOSIS — I480 Paroxysmal atrial fibrillation: Secondary | ICD-10-CM | POA: Diagnosis not present

## 2023-02-25 LAB — COMPREHENSIVE METABOLIC PANEL
ALT: 11 U/L (ref 0–44)
AST: 16 U/L (ref 15–41)
Albumin: 2.3 g/dL — ABNORMAL LOW (ref 3.5–5.0)
Alkaline Phosphatase: 43 U/L (ref 38–126)
Anion gap: 11 (ref 5–15)
BUN: 25 mg/dL — ABNORMAL HIGH (ref 8–23)
CO2: 22 mmol/L (ref 22–32)
Calcium: 8.4 mg/dL — ABNORMAL LOW (ref 8.9–10.3)
Chloride: 101 mmol/L (ref 98–111)
Creatinine, Ser: 1.28 mg/dL — ABNORMAL HIGH (ref 0.44–1.00)
GFR, Estimated: 39 mL/min — ABNORMAL LOW (ref >60)
Glucose, Bld: 103 mg/dL — ABNORMAL HIGH (ref 70–99)
Potassium: 3.9 mmol/L (ref 3.5–5.1)
Sodium: 134 mmol/L — ABNORMAL LOW (ref 135–145)
Total Bilirubin: 1.6 mg/dL — ABNORMAL HIGH (ref 0.3–1.2)
Total Protein: 4.9 g/dL — ABNORMAL LOW (ref 6.5–8.1)

## 2023-02-25 LAB — CBC WITH DIFFERENTIAL/PLATELET
Abs Immature Granulocytes: 0.07 10*3/uL (ref 0.00–0.07)
Basophils Absolute: 0.1 10*3/uL (ref 0.0–0.1)
Basophils Relative: 1 %
Eosinophils Absolute: 0 10*3/uL (ref 0.0–0.5)
Eosinophils Relative: 0 %
HCT: 29.9 % — ABNORMAL LOW (ref 36.0–46.0)
Hemoglobin: 9.9 g/dL — ABNORMAL LOW (ref 12.0–15.0)
Immature Granulocytes: 1 %
Lymphocytes Relative: 5 %
Lymphs Abs: 0.7 10*3/uL (ref 0.7–4.0)
MCH: 29.4 pg (ref 26.0–34.0)
MCHC: 33.1 g/dL (ref 30.0–36.0)
MCV: 88.7 fL (ref 80.0–100.0)
Monocytes Absolute: 1.1 10*3/uL — ABNORMAL HIGH (ref 0.1–1.0)
Monocytes Relative: 7 %
Neutro Abs: 12.6 10*3/uL — ABNORMAL HIGH (ref 1.7–7.7)
Neutrophils Relative %: 86 %
Platelets: 460 10*3/uL — ABNORMAL HIGH (ref 150–400)
RBC: 3.37 MIL/uL — ABNORMAL LOW (ref 3.87–5.11)
RDW: 16.9 % — ABNORMAL HIGH (ref 11.5–15.5)
WBC: 14.6 10*3/uL — ABNORMAL HIGH (ref 4.0–10.5)
nRBC: 0 % (ref 0.0–0.2)

## 2023-02-25 LAB — PROTIME-INR
INR: 2.3 — ABNORMAL HIGH (ref 0.8–1.2)
Prothrombin Time: 25.8 seconds — ABNORMAL HIGH (ref 11.4–15.2)

## 2023-02-25 LAB — APTT: aPTT: 34 seconds (ref 24–36)

## 2023-02-25 NOTE — ED Triage Notes (Signed)
Pt from home by ambulance hypotensive,tachycardic, diahrea,weakness. Alert and orientated.ambulatory. skin warm and dry. 103/60in room.

## 2023-02-25 NOTE — ED Provider Notes (Incomplete)
[place] EMERGENCY DEPARTMENT AT [facility] Provider Note   CSN: [id number] Arrival date & time: 02/25/23  2230     History {Add pertinent medical, surgical, social history, OB history to HPI:1} Chief Complaint  Patient presents with   Diarrhea    Leslie [name] is a 89 y.o. female.  Patient presents to the emergency department via EMS due to family calling over increased diarrhea.  Patient lives at home alone and is very independent.  Upon EMS arrival she was noted to be hypotensive with a systolic pressure in the 70s and was tachycardic with a heart rate in the low 100s.  Patient had an episode of diarrhea while being moved to the stretcher.  Upon arrival patient denies any complaints.  She states she has had diarrhea but that is not worse than usual, denies chest pain, shortness of breath, abdominal pain, nausea, vomiting, fever.  EMS activated a sepsis alert due to the patient's tachycardia and hypotension.  Upon arrival at the emergency department, vitals did not meet SIRS criteria.  Past medical history significant for hypertension, pulmonary emphysema, aortic atherosclerosis, chronic combined systolic and diastolic CHF, CKD stage III  HPI     Home Medications Prior to Admission medications   Medication Sig Start Date End Date Taking? Authorizing Provider  atorvastatin (LIPITOR) 20 MG tablet Take  1 tablet  Daily for Cholesterol 06/10/22   Lucky Cowboy, MD  Cholecalciferol (VITAMIN D3) 25 MCG (1000 UT) CAPS Take 3 capsules  (3,000 units) Daily 05/17/21   Lucky Cowboy, MD  ELIQUIS 2.5 MG TABS tablet TAKE 1 TABLET TWICE A DAY 05/28/22   Raynelle Dick, NP  latanoprost (XALATAN) 0.005 % ophthalmic solution 1 drop at bedtime. 10/21/22   [provider]  metoprolol succinate (TOPROL-XL) 25 MG 24 hr tablet Take  1 tablet  Daily  for Heart & BP 06/04/22   Lucky Cowboy, MD  QUEtiapine (SEROQUEL XR) 50 MG TB24 24 hr tablet Take 1 tablet Daily at Lunchtime  for Memory & Concentration 09/17/22   Lucky Cowboy, MD      Allergies    Adhesive [tape], Advil [ibuprofen], and Atenolol    Review of Systems   Review of Systems  Physical Exam Updated Vital Signs BP 102/62   Pulse 69   Temp 97.6 F (36.4 C) (Oral)   Resp 19   Ht 5\' 2"  (1.575 m)   Wt 50.3 kg   SpO2 100%   BMI 20.30 kg/m  Physical Exam Vitals and nursing note reviewed.  Constitutional:      General: She is not in acute distress.    Appearance: She is well-developed.  HENT:     Head: Normocephalic and atraumatic.  Eyes:     Conjunctiva/sclera: Conjunctivae normal.  Cardiovascular:     Rate and Rhythm: Normal rate and regular rhythm.     Heart sounds: No murmur heard. Pulmonary:     Effort: Pulmonary effort is normal. No respiratory distress.     Breath sounds: Normal breath sounds.  Abdominal:     Palpations: Abdomen is soft.     Tenderness: There is no abdominal tenderness.  Musculoskeletal:        General: No swelling.     Cervical back: Neck supple.  Skin:    General: Skin is warm and dry.     Capillary Refill: Capillary refill takes less than 2 seconds.  Neurological:     Mental Status: She is alert.  Psychiatric:  Mood and Affect: Mood normal.     ED Results / Procedures / Treatments   Labs (all labs ordered are listed, but only abnormal results are displayed) Labs Reviewed  CBC WITH DIFFERENTIAL/PLATELET - Abnormal; Notable for the following components:      Result Value   WBC 14.6 (*)    RBC 3.37 (*)    Hemoglobin 9.9 (*)    HCT 29.9 (*)    RDW 16.9 (*)    Platelets 460 (*)    Neutro Abs 12.6 (*)    Monocytes Absolute 1.1 (*)    All other components within normal limits  CULTURE, BLOOD (ROUTINE X 2)  CULTURE, BLOOD (ROUTINE X 2)  C DIFFICILE QUICK SCREEN W PCR REFLEX    COMPREHENSIVE METABOLIC PANEL  PROTIME-INR  APTT  URINALYSIS, W/ REFLEX TO CULTURE (INFECTION SUSPECTED)  I-STAT CG4 LACTIC ACID, ED    EKG EKG  Interpretation Date/Time:  Tuesday February 25 2023 22:44:29 EDT Ventricular Rate:  83 PR Interval:  135 QRS Duration:  143 QT Interval:  434 QTC Calculation: 510 R Axis:   -70  Text Interpretation: Sinus rhythm Left bundle branch block No significant change since prior 3/22 Confirmed by Meridee Score [phone]) on 02/25/2023 10:47:42 PM  Radiology No results found.  Procedures Procedures  {Document cardiac monitor, telemetry assessment procedure when appropriate:1}  Medications Ordered in ED Medications - No data to display  ED Course/ Medical Decision Making/ A&P   {   Click here for ABCD2, HEART and other calculatorsREFRESH Note before signing :1}                              Medical Decision Making Amount and/or Complexity of Data Reviewed Labs: ordered. Radiology: ordered. ECG/medicine tests: ordered.   This patient presents to the ED for concern of diarrhea, weakness, this involves an extensive number of treatment options, and is a complaint that carries with it a high risk of complications and morbidity.  The differential diagnosis includes infection, sepsis, deconditioning, dysrhythmia, others   Co morbidities that complicate the patient evaluation  History of embolic stroke, chronic kidney disease, CHF   Additional history obtained:  Additional history obtained from EMS External records from outside source obtained and reviewed including internal medicine notes from August 6, telephone encounter from yesterday showing plans for possible admission to a nursing facility   Lab Tests:  I Ordered, and personally interpreted labs.  The pertinent results include:  ***   Imaging Studies ordered:  I ordered imaging studies including ***  I independently visualized and interpreted imaging which showed *** I agree with the radiologist interpretation   Cardiac Monitoring: / EKG:  The patient was maintained on a cardiac monitor.  I personally viewed and  interpreted the cardiac monitored which showed an underlying rhythm of: ***   Consultations Obtained:  I requested consultation with the ***,  and discussed lab and imaging findings as well as pertinent plan - they recommend: ***   Problem List / ED Course / Critical interventions / Medication management  *** I ordered medication including ***  for ***  Reevaluation of the patient after these medicines showed that the patient {resolved/improved/worsened:23923::"improved"} I have reviewed the patients home medicines and have made adjustments as needed   Social Determinants of Health:  ***   Test / Admission - Considered:  ***   {Document critical care time when appropriate:1} {Document review of labs and clinical decision  tools ie heart score, Chads2Vasc2 etc:1}  {Document your independent review of radiology images, and any outside records:1} {Document your discussion with family members, caretakers, and with consultants:1} {Document social determinants of health affecting pt's care:1} {Document your decision making why or why not admission, treatments were needed:1} Final Clinical Impression(s) / ED Diagnoses Final diagnoses:  None    Rx / DC Orders ED Discharge Orders     None

## 2023-02-26 ENCOUNTER — Telehealth: Payer: Self-pay

## 2023-02-26 ENCOUNTER — Emergency Department (HOSPITAL_COMMUNITY): Payer: Medicare Other

## 2023-02-26 ENCOUNTER — Encounter (HOSPITAL_COMMUNITY): Payer: Self-pay | Admitting: Student

## 2023-02-26 DIAGNOSIS — Z90710 Acquired absence of both cervix and uterus: Secondary | ICD-10-CM | POA: Diagnosis not present

## 2023-02-26 DIAGNOSIS — R197 Diarrhea, unspecified: Secondary | ICD-10-CM

## 2023-02-26 DIAGNOSIS — K5641 Fecal impaction: Secondary | ICD-10-CM | POA: Diagnosis not present

## 2023-02-26 LAB — RESPIRATORY PANEL BY PCR

## 2023-02-26 LAB — CBC WITH DIFFERENTIAL/PLATELET
Abs Immature Granulocytes: 0.04 10*3/uL (ref 0.00–0.07)
Basophils Absolute: 0.1 10*3/uL (ref 0.0–0.1)
Basophils Relative: 1 %
Eosinophils Absolute: 0.1 10*3/uL (ref 0.0–0.5)
Eosinophils Relative: 1 %
HCT: 29.8 % — ABNORMAL LOW (ref 36.0–46.0)
Hemoglobin: 9.9 g/dL — ABNORMAL LOW (ref 12.0–15.0)
Immature Granulocytes: 0 %
Lymphocytes Relative: 6 %
Lymphs Abs: 0.6 10*3/uL — ABNORMAL LOW (ref 0.7–4.0)
MCH: 28.9 pg (ref 26.0–34.0)
MCHC: 33.2 g/dL (ref 30.0–36.0)
MCV: 86.9 fL (ref 80.0–100.0)
Monocytes Absolute: 0.7 10*3/uL (ref 0.1–1.0)
Monocytes Relative: 7 %
Neutro Abs: 8.6 10*3/uL — ABNORMAL HIGH (ref 1.7–7.7)
Neutrophils Relative %: 85 %
Platelets: 506 10*3/uL — ABNORMAL HIGH (ref 150–400)
RBC: 3.43 MIL/uL — ABNORMAL LOW (ref 3.87–5.11)
RDW: 17.1 % — ABNORMAL HIGH (ref 11.5–15.5)
WBC: 10.1 10*3/uL (ref 4.0–10.5)
nRBC: 0 % (ref 0.0–0.2)

## 2023-02-26 LAB — COMPREHENSIVE METABOLIC PANEL
ALT: 10 U/L (ref 0–44)
AST: 13 U/L — ABNORMAL LOW (ref 15–41)
Albumin: 2.2 g/dL — ABNORMAL LOW (ref 3.5–5.0)
Alkaline Phosphatase: 47 U/L (ref 38–126)
Anion gap: 9 (ref 5–15)
BUN: 25 mg/dL — ABNORMAL HIGH (ref 8–23)
CO2: 26 mmol/L (ref 22–32)
Calcium: 8.3 mg/dL — ABNORMAL LOW (ref 8.9–10.3)
Chloride: 104 mmol/L (ref 98–111)
Creatinine, Ser: 1.28 mg/dL — ABNORMAL HIGH (ref 0.44–1.00)
GFR, Estimated: 39 mL/min — ABNORMAL LOW (ref >60)
Glucose, Bld: 85 mg/dL (ref 70–99)
Potassium: 4 mmol/L (ref 3.5–5.1)
Sodium: 139 mmol/L (ref 135–145)
Total Bilirubin: 1.8 mg/dL — ABNORMAL HIGH (ref 0.3–1.2)
Total Protein: 4.9 g/dL — ABNORMAL LOW (ref 6.5–8.1)

## 2023-02-26 LAB — LIPASE, BLOOD: Lipase: 36 U/L (ref 11–51)

## 2023-02-26 LAB — SARS CORONAVIRUS 2 BY RT PCR: SARS Coronavirus 2 by RT PCR: NEGATIVE

## 2023-02-26 LAB — I-STAT CG4 LACTIC ACID, ED: Lactic Acid, Venous: 1.5 mmol/L (ref 0.5–1.9)

## 2023-02-26 MED ORDER — SODIUM CHLORIDE 0.9% FLUSH
3.0000 mL | Freq: Two times a day (BID) | INTRAVENOUS | Status: DC
  Administered 2023-02-26 – 2023-02-28 (×4): 3 mL via INTRAVENOUS

## 2023-02-26 MED ORDER — ONDANSETRON HCL 4 MG PO TABS: 4.0000 mg | ORAL_TABLET | Freq: Four times a day (QID) | ORAL | Status: DC | PRN

## 2023-02-26 MED ORDER — ONDANSETRON HCL 4 MG/2ML IJ SOLN: 4.0000 mg | Freq: Four times a day (QID) | INTRAMUSCULAR | Status: DC | PRN

## 2023-02-26 MED ORDER — IOHEXOL 350 MG/ML SOLN
55.0000 mL | Freq: Once | INTRAVENOUS | Status: AC | PRN
  Administered 2023-02-26: 55 mL via INTRAVENOUS

## 2023-02-26 MED ORDER — SORBITOL 70 % SOLN
200.0000 mL | TOPICAL_OIL | Freq: Once | ORAL | Status: AC
  Administered 2023-02-26: 200 mL via RECTAL
  Filled 2023-02-26: qty 60

## 2023-02-26 MED ORDER — APIXABAN 2.5 MG PO TABS
2.5000 mg | ORAL_TABLET | Freq: Two times a day (BID) | ORAL | Status: DC
  Administered 2023-02-26 – 2023-02-28 (×5): 2.5 mg via ORAL
  Filled 2023-02-26 (×6): qty 1

## 2023-02-26 MED ORDER — QUETIAPINE FUMARATE ER 50 MG PO TB24
50.0000 mg | ORAL_TABLET | Freq: Every day | ORAL | Status: DC
  Administered 2023-02-26 – 2023-02-28 (×3): 50 mg via ORAL
  Filled 2023-02-26 (×3): qty 1

## 2023-02-26 MED ORDER — ACETAMINOPHEN 650 MG RE SUPP: 650.0000 mg | Freq: Four times a day (QID) | RECTAL | Status: DC | PRN

## 2023-02-26 MED ORDER — TUBERCULIN PPD 5 UNIT/0.1ML ID SOLN
5.0000 [IU] | Freq: Once | INTRADERMAL | Status: AC
  Administered 2023-02-26: 5 [IU] via INTRADERMAL
  Filled 2023-02-26: qty 0.1

## 2023-02-26 MED ORDER — IOHEXOL 350 MG/ML SOLN: 75.0000 mL | Freq: Once | INTRAVENOUS | Status: DC | PRN

## 2023-02-26 MED ORDER — SODIUM CHLORIDE 0.9 % IV SOLN: Freq: Once | INTRAVENOUS | Status: AC

## 2023-02-26 MED ORDER — LIDOCAINE HCL URETHRAL/MUCOSAL 2 % EX GEL
1.0000 | Freq: Once | CUTANEOUS | Status: AC
  Administered 2023-02-26: 1 via TOPICAL
  Filled 2023-02-26: qty 22

## 2023-02-26 MED ORDER — IPRATROPIUM-ALBUTEROL 0.5-2.5 (3) MG/3ML IN SOLN: 3.0000 mL | Freq: Four times a day (QID) | RESPIRATORY_TRACT | Status: DC | PRN

## 2023-02-26 MED ORDER — ACETAMINOPHEN 325 MG PO TABS
650.0000 mg | ORAL_TABLET | Freq: Four times a day (QID) | ORAL | Status: DC | PRN
  Administered 2023-02-27: 650 mg via ORAL
  Filled 2023-02-26: qty 2

## 2023-02-26 MED ORDER — SODIUM CHLORIDE 0.9 % IV SOLN: INTRAVENOUS | Status: DC

## 2023-02-26 MED ORDER — LATANOPROST 0.005 % OP SOLN
1.0000 [drp] | Freq: Every day | OPHTHALMIC | Status: DC
  Administered 2023-02-26 – 2023-02-27 (×2): 1 [drp] via OPHTHALMIC
  Filled 2023-02-26: qty 2.5

## 2023-02-26 NOTE — Telephone Encounter (Signed)
-----   Message from [facility] sent at 02/19/2023  2:51 PM EDT ----- Regarding: F2F Placement Form I was informed that a Face to Face from was being faxed from [facility] yesterday to complete for placement but I have not yet seen anything come through.    Please let me know if they have sent and if not, please reach out to request a form. Thanks

## 2023-02-26 NOTE — Progress Notes (Signed)
Patient to 2W06 at this time

## 2023-02-26 NOTE — Evaluation (Signed)
Physical Therapy Evaluation Patient Details Name: Leslie [name] MRN: [id number] DOB: [birth year] Today's Date: 02/26/2023  History of Present Illness  Patient is a 89 yo female presenting to the ED with diarrhea on 02/25/23. Found to be tachycardic and hypotensive. Elevated WBC and fecal impaction noted. PMH includes: permanent atrial fibrillation, labile hypertension, dyslipidemia, prediabetes, history of low back pain secondary to compression fractures, stroke dyslipidemia, and glaucoma.  Clinical Impression  Pt admitted with above. Plans to d/c to [facility] following d/c. Pt presents with weakness, impaired standing balance, and decreased activity tolerance. Pt ambulating 80 ft with handheld assist, HR 84 bpm. Reports "achiness," in distal RLE after walk. Would benefit from follow up HHPT at ALF to address deficits and maximize functional mobility.       If plan is discharge home, recommend the following: A little help with walking and/or transfers;A little help with bathing/dressing/bathroom;Assistance with cooking/housework;Assist for transportation   Can travel by private vehicle        Equipment Recommendations None recommended by PT  Recommendations for Other Services       Functional Status Assessment Patient has had a recent decline in their functional status and demonstrates the ability to make significant improvements in function in a reasonable and predictable amount of time.     Precautions / Restrictions Precautions Precautions: Fall Restrictions Weight Bearing Restrictions: No      Mobility  Bed Mobility Overal bed mobility: Needs Assistance Bed Mobility: Supine to Sit, Sit to Supine     Supine to sit: Supervision Sit to supine: Supervision   General bed mobility comments: Increased time/effort, HOB elevated 35 degrees, no physical assist required    Transfers Overall transfer level: Needs assistance Equipment used: None Transfers: Sit to/from  Stand Sit to Stand: Min assist           General transfer comment: Light minA to come to standing    Ambulation/Gait Ambulation/Gait assistance: Min assist Gait Distance (Feet): 80 Feet Assistive device: 1 person hand held assist Gait Pattern/deviations: Step-through pattern, Decreased stride length Gait velocity: decreased     General Gait Details: Dynamic instability, requiring HHA  Stairs            Wheelchair Mobility     Tilt Bed    Modified Rankin (Stroke Patients Only)       Balance Overall balance assessment: Mild deficits observed, not formally tested                                           Pertinent Vitals/Pain Pain Assessment Pain Assessment: Faces Faces Pain Scale: Hurts a little bit Pain Location: lateral, distal RLE after walk Pain Descriptors / Indicators: Aching Pain Intervention(s): Monitored during session    Home Living Family/patient expects to be discharged to:: Private residence Living Arrangements: Alone Available Help at Discharge: Family;Personal care attendant Type of Home: House Home Access: Stairs to enter Entrance Stairs-Rails: Right Entrance Stairs-Number of Steps: 2   Home Layout: One level Home Equipment: Production assistant, radio - single point Additional Comments: 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, sons are trying to get her into [facility] and are awaiting paperwork from MD    Prior Function Prior Level of Function : History of Falls (last six months);Needs assist             Mobility Comments: furniture surfer,  fiercely independent ADLs Comments: has not been participating in higher level ADLs (showering), losing weight quickly     Extremity/Trunk Assessment   Upper Extremity Assessment Upper Extremity Assessment: Defer to OT evaluation    Lower Extremity Assessment Lower Extremity Assessment: RLE deficits/detail;LLE deficits/detail RLE Deficits / Details: Grossly  4/5 strength LLE Deficits / Details: Grossly 4/5 strength    Cervical / Trunk Assessment Cervical / Trunk Assessment: Kyphotic (minimally)  Communication   Communication Communication: Hearing impairment  Cognition Arousal: Alert Behavior During Therapy: WFL for tasks assessed/performed Overall Cognitive Status: Within Functional Limits for tasks assessed                                 General Comments: Minimally hard of hearing        General Comments      Exercises     Assessment/Plan    PT Assessment Patient needs continued PT services  PT Problem List Decreased strength;Decreased activity tolerance;Decreased balance;Decreased mobility       PT Treatment Interventions DME instruction;Gait training;Functional mobility training;Therapeutic activities;Balance training;Therapeutic exercise;Patient/family education    PT Goals (Current goals can be found in the Care Plan section)  Acute Rehab PT Goals Patient Stated Goal: get stronger PT Goal Formulation: With patient Time For Goal Achievement: 03/12/23 Potential to Achieve Goals: Good    Frequency Min 1X/week     Co-evaluation               AM-PAC PT "6 Clicks" Mobility  Outcome Measure Help needed turning from your back to your side while in a flat bed without using bedrails?: None Help needed moving from lying on your back to sitting on the side of a flat bed without using bedrails?: A Little Help needed moving to and from a bed to a chair (including a wheelchair)?: A Little Help needed standing up from a chair using your arms (e.g., wheelchair or bedside chair)?: A Little Help needed to walk in hospital room?: A Little Help needed climbing 3-5 steps with a railing? : A Lot 6 Click Score: 18    End of Session Equipment Utilized During Treatment: Gait belt Activity Tolerance: Patient tolerated treatment well Patient left: in bed;with call bell/phone within reach;with family/visitor  present Nurse Communication: Mobility status PT Visit Diagnosis: Unsteadiness on feet (R26.81);History of falling (Z91.81)    Time: 4098-1191 PT Time Calculation (min) (ACUTE ONLY): 20 min   Charges:   PT Evaluation $PT Eval Low Complexity: 1 Low   PT General Charges $$ ACUTE PT VISIT: 1 Visit         Lillia Pauls, PT, DPT Acute Rehabilitation Services Office [phone]   Norval Morton 02/26/2023, 4:32 PM

## 2023-02-26 NOTE — Telephone Encounter (Signed)
F2F form finally was faxed to our office.However, patient is currently admitted to the hospital.

## 2023-02-26 NOTE — H&P (Signed)
History and Physical    Patient: Leslie [name] NWG:[id number] DOB: 04/13/1927 DOA: 02/25/2023 DOS: the patient was seen and examined on 02/26/2023 PCP: Lucky Cowboy, MD  Patient coming from: Home-in process trying to get patient placed at [facility]   Chief Complaint:  Chief Complaint  Patient presents with   Diarrhea   HPI: Leslie [name] is a 89 y.o. female with medical history significant of permanent atrial fibrillation, labile hypertension, dyslipidemia, prediabetes, history of low back pain secondary to compression fractures, dyslipidemia, and glaucoma.  Patient had been in her usual state of health.  She does have a history of constipation and had been on Senokot regularly until about 3 weeks ago.  This medication was discontinued due to diarrhea.  Patient developed increased episodes of diarrhea over the past several days and her family called EMS to bring her to the hospital.  Upon arrival to the hospital she was tachycardic and hypotensive with systolic blood pressures in the 70s and heart rates in the low to mid 100s.  She was alert and oriented and mentating appropriately.  She was afebrile.  Her white count was elevated at 14,600.  Initial sodium was 134, BUN 25, creatinine 1.28 with baseline between 0.96 and 1.19.  Total bilirubin was slightly elevated at 1.6, GFR was down to 39, lactic acid 2.4, white count 14,600 with a left shift, PT 25.8 and INR 2.3 noting patient is on chronic Eliquis, blood cultures have been obtained, CT of the abdomen and pelvis revealed mild fecal impaction/moderate rectal stool burden with mild stercoral colitis.  No other etiology to the patient's diarrhea noted.  She was not having any abdominal pain.  She had 1 additional episode of diarrhea after arrival and has no further episodes.  Hospitalist team was asked to evaluate the patient for admission.  Upon my evaluation of the patient her vital signs were stable with a BP of 119/62, she was not hypoxic, she  was no longer tachycardic.  I have ordered follow-up labs.  She had been given 1 L of normal saline at 150 cc/h which has been completed.  Patient's son is at the bedside and has confirmed DNR status.  I have updated patient and son regarding current evaluation and suspicion that diarrhea likely secondary to body's natural response to fecal impaction.  They are aware we are also ruling out infectious causes such as virus or urinary tract infection.  Review of Systems: As mentioned in the history of present illness. All other systems reviewed and are negative. Past Medical History:  Diagnosis Date   Anxiety disorder    Cardiomyopathy (HCC)    Glaucoma    Hearing loss    HTN (hypertension)    Hyperglycemia    Hyperlipidemia    LBBB (left bundle branch block)    Stroke (cerebrum) (HCC)    Vitamin D deficiency    Past Surgical History:  Procedure Laterality Date   GLAUCOMA SURGERY  2013   TONSILLECTOMY     Social History:  reports that she quit smoking about 43 years ago. Her smoking use included cigarettes. She has never used smokeless tobacco. She reports that she does not drink alcohol and does not use drugs.  Allergies  Allergen Reactions   Adhesive [Tape]    Advil [Ibuprofen]    Atenolol     Family History  Problem Relation Age of Onset   Heart attack Brother    Heart disease Brother    Heart attack Brother  Heart disease Brother    Hypertension Mother    Stroke Mother    Heart attack Father    Diabetes Father     Prior to Admission medications   Medication Sig Start Date End Date Taking? Authorizing Provider  atorvastatin (LIPITOR) 20 MG tablet Take  1 tablet  Daily for Cholesterol 06/10/22   Lucky Cowboy, MD  Cholecalciferol (VITAMIN D3) 25 MCG (1000 UT) CAPS Take 3 capsules  (3,000 units) Daily 05/17/21   Lucky Cowboy, MD  ELIQUIS 2.5 MG TABS tablet TAKE 1 TABLET TWICE A DAY 05/28/22   Raynelle Dick, NP  latanoprost (XALATAN) 0.005 % ophthalmic solution  1 drop at bedtime. 10/21/22   [provider]  metoprolol succinate (TOPROL-XL) 25 MG 24 hr tablet Take  1 tablet  Daily  for Heart & BP 06/04/22   Lucky Cowboy, MD  QUEtiapine (SEROQUEL XR) 50 MG TB24 24 hr tablet Take 1 tablet Daily at [facility] for Memory & Concentration 09/17/22   Lucky Cowboy, MD    Physical Exam: Vitals:   02/26/23 0700 02/26/23 0715 02/26/23 0730 02/26/23 0745  BP: 121/63 (!) 111/53 (!) 110/46 119/62  Pulse:      Resp: 19 18 17 19   Temp:      TempSrc:      SpO2:      Weight:      Height:       Constitutional: NAD, calm, comfortable Respiratory: clear to auscultation bilaterally, no wheezing, no crackles. Normal respiratory effort. No accessory muscle use. RA  Cardiovascular: Regular rate and rhythm, no murmurs / rubs / gallops. No extremity edema. 2+ pedal pulses.  Abdomen: no tenderness, no masses palpated. No hepatosplenomegaly. Bowel sounds positive.  Musculoskeletal: no clubbing / cyanosis. No joint deformity upper and lower extremities. Good ROM, no contractures. Normal muscle tone.  Skin: no rashes, lesions, ulcers. No induration Neurologic: CN 2-12 grossly intact. Sensation intact, Strength 5/5 x all 4 extremities.  Psychiatric: Normal judgment and insight. Alert and oriented x 3. Normal mood.   Data Reviewed:  As per HPI  Assessment and Plan: Intractable diarrhea in context of fecal impaction/stercoral colitis Patient had been on scheduled Senokot but this was discontinued about 3 to 4 weeks ago due to persistent diarrhea. Will give SMOG enema x 1 since stool burden is primarily located in the rectal vault Continue IV fluids Rule out infectious causes: Check RVP and GIP.  COVID PCR is negative; check urinalysis and culture as well. Will need to be started on a new bowel regimen at time of discharge.  Patient may do better with MiraLAX at bedtime and +/- Colace 100 mg twice daily  SIRS physiology Secondary to volume depletion from  diarrhea Continue IV fluids as above Hypotension, tachycardia and lactic acidosis has resolved with hydration Follow-up on electrolyte panel and CBC.  Suspect leukocytosis primarily related to SIRS physiology/hypoperfusion Infectious workup also in process but currently has no signs of sepsis  Permanent atrial fibrillation Currently rate controlled and recent tachycardia secondary to volume depletion Given recent hypotension Toprol-XL on hold but will continue telemetry monitoring and if necessary institute as needed Lopressor IV Continue Eliquis  CKD 3a Presented with mild acute kidney injury in context of volume depletion Repeat electrolyte panel pending Urine microalbumin in October 2023 was normal  Labile hypertension Primarily on diet control Toprol can improve hypertension but has less effect than other medications and primary utilization for Toprol and this patient appears to be for rate control for A-fib  Chronic low back pain secondary to compression fractures/low vitamin D Recent imaging reveals T5, T8 and T12 compression fractures noted from 03/11/2006 but also age-indeterminate.  She also has multilevel degenerative disc disease Currently utilizing Tylenol for pain management Will obtain PT and OT consultation Family interested in placing patient in assisted living facility-there is also a mild degree of short-term memory loss-TOC made aware that PCP has begun paperwork process for placement  Thrombocytosis Follows with Dr. Pamelia Hoit Currently on a watchful monitoring plan and if platelets go up above 900,000 may institute platelet reduction therapy  Dyslipidemia Statin on hold until advance diet to regular/heart healthy  Prediabetes Hemoglobin A1c April 2024 was 5.6    Advance Care Planning:   Code Status: DNR -no documents under ACP.  Prior CODE STATUS has been DNR.  This was confirmed with son at bedside.  VTE prophylaxis: Eliquis  Consults: None  Family  Communication: Son at bedside  Severity of Illness: The appropriate patient status for this patient is OBSERVATION. Observation status is judged to be reasonable and necessary in order to provide the required intensity of service to ensure the patient's safety. The patient's presenting symptoms, physical exam findings, and initial radiographic and laboratory data in the context of their medical condition is felt to place them at decreased risk for further clinical deterioration. Furthermore, it is anticipated that the patient will be medically stable for discharge from the hospital within 2 midnights of admission.   Author: Junious Silk, NP 02/26/2023 8:10 AM  For on call review [email or web address].

## 2023-02-26 NOTE — TOC Initial Note (Signed)
Transition of Care [facility]) - Initial/Assessment Note    Patient Details  Name: Leslie [name] MRN: [id number] Date of Birth: 14-Mar-1927  Transition of Care [facility]) CM/SW Contact:    [name], RN Phone Number: 02/26/2023, 12:51 PM  Clinical Narrative:                 CM met with the patient and son at the bedside to discuss TOC needs.  The patient admitted to the hospital for intractable diarrhea.  Medicare Observation letter will be given to the patient's son.  The son states that the patent was independent at the home but has been declining in the past couple of months.  Patient does not have DME at the home and fell about 1 month ago.  History of CVA but little residual issues.  Son states that [facility] is holding a bed for the patient - patient was actually due to see PCP this am for TB skin test of completion of FL2 that the PCP has not provided to the son at this time.  Patient was seen by OT and is pending PT evaluation.   \ TB skin test to be given today.    I called and left a message with Rowe Clack, CM at [facility] - [phone].  CM will continue to follow the patient for TOC needs - pending  ALF placement - if not possible - can discharge home with [facility] with ALF pending soon.    Expected Discharge Plan: Assisted Living Barriers to Discharge: Continued Medical Work up   Patient Goals and CMS Choice Patient states their goals for this hospitalization and ongoing recovery are:: To get better CMS Medicare.gov Compare Post Acute Care list provided to:: Patient Represenative (must comment) (son, [name] at the bedside) Choice offered to / list presented to : Adult Children      Expected Discharge Plan and Services   Discharge Planning Services: CM Consult Post Acute Care Choice: Resumption of Svcs/PTA Provider (Family needs assistance with ALF) Living arrangements for the past 2 months: Single Family Home                                       Prior Living Arrangements/Services Living arrangements for the past 2 months: Single Family Home Lives with:: Self Patient language and need for interpreter reviewed:: Yes Do you feel safe going back to the place where you live?: Yes (Patient's family needs assistance with FL2 and requests TB skin for ALF placement at [facility] - holding a bed at the facility - see note)      Need for Family Participation in Patient Care: Yes (Comment) Care giver support system in place?: Yes (comment) Current home services: DME (Shower seat - does not use) Criminal Activity/Legal Involvement Pertinent to Current Situation/Hospitalization: No - Comment as needed  Activities of Daily Living Home Assistive Devices/Equipment: Eyeglasses ADL Screening (condition at time of admission) Patient's cognitive ability adequate to safely complete daily activities?: Yes Is the patient deaf or have difficulty hearing?: No Does the patient have difficulty seeing, even when wearing glasses/contacts?: No Does the patient have difficulty concentrating, remembering, or making decisions?: Yes Patient able to express need for assistance with ADLs?: Yes Does the patient have difficulty dressing or bathing?: No Independently performs ADLs?: Yes (appropriate for developmental age) Does the patient have difficulty walking or climbing stairs?: No Weakness of  Legs: None Weakness of Arms/Hands: None  Permission Sought/Granted Permission sought to share information with : Case Manager, Magazine features editor, Family Supports Permission granted to share information with : Yes, Verbal Permission Granted     Permission granted to share info w AGENCY: Rowe Clack, CM at [facility]  Permission granted to share info w Relationship: Lorn Junes, son     Emotional Assessment Appearance:: Appears stated age Attitude/Demeanor/Rapport: Gracious Affect (typically observed): Accepting Orientation: :  Oriented to Self, Oriented to Place, Oriented to  Time, Oriented to Situation Alcohol / Substance Use: Not Applicable Psych Involvement: No (comment)  Admission diagnosis:  Diarrhea in adult patient [R19.7] Intractable diarrhea [R19.7] Stercoral colitis [K52.89] Patient Active Problem List   Diagnosis Date Noted   Intractable diarrhea 02/26/2023   Aortic atherosclerosis (HCC) - CT 09/2020 10/11/2020   Pulmonary emphysema (HCC) - per CT 09/2020 10/11/2020   Slow transit constipation    Chronic combined systolic and diastolic CHF (congestive heart failure) (HCC)    Chronic low back pain without sciatica    History of embolic stroke 10/01/2020   DNR no code (do not resuscitate) 10/01/2020   Thrombocytosis 10/01/2020   Body mass index (BMI) of 19.0-19.9 in adult 05/15/2020   CKD (chronic kidney disease) stage 3, GFR 30-59 ml/min (HCC) 05/15/2020   Labile hypertension 06/16/2018   FHx: heart disease 06/16/2018   Former smoker 06/16/2018   Abnormal glucose 06/16/2018   Anxiety 03/04/2018   Medication management 04/08/2014   Vitamin D deficiency 07/01/2013   Hyperlipidemia, mixed 07/01/2013   LBBB (left bundle branch block) 02/20/2010   PCP:  Lucky Cowboy, MD Pharmacy:   [facility] DELIVERY - [place], [place] - [address] [place] [address] Phone: [phone] Fax: [phone]  [facility] [address] [place] - [address] [place] [address] Phone: [phone] Fax: [phone]     Social Determinants of Health (SDOH) Social History: SDOH Screenings   Food Insecurity: No Food Insecurity (02/26/2023)  Housing: Low Risk  (02/26/2023)  Transportation Needs: No Transportation Needs (02/26/2023)  Utilities: Not At Risk (02/26/2023)  Depression (PHQ2-9): Low Risk  (11/11/2022)  Tobacco Use: Medium Risk (02/26/2023)   SDOH Interventions:     Readmission Risk Interventions     No data to display

## 2023-02-26 NOTE — NC FL2 (Signed)
[place] MEDICAID FL2 LEVEL OF CARE FORM     IDENTIFICATION  Patient Name: Leslie [name] Birthdate: 1927/03/07 Sex: female Admission Date (Current Location): 02/25/2023  [facility] and [place] Number:  Producer, television/film/video and Address:  [facility], [address] [place], [place] [address]      Provider Number: [id number]  Attending Physician Name and Address:  Almon Hercules, MD  Relative Name and Phone Number:  Lorn Junes, son - [phone]    Current Level of Care: Hospital Recommended Level of Care: Assisted Living Facility Prior Approval Number:    Date Approved/Denied:   PASRR Number:    Discharge Plan: Other (Comment) (Assisted Living Facility)    Current Diagnoses: Patient Active Problem List   Diagnosis Date Noted   Intractable diarrhea 02/26/2023   Aortic atherosclerosis (HCC) - CT 09/2020 10/11/2020   Pulmonary emphysema (HCC) - per CT 09/2020 10/11/2020   Slow transit constipation    Chronic combined systolic and diastolic CHF (congestive heart failure) (HCC)    Chronic low back pain without sciatica    History of embolic stroke 10/01/2020   DNR no code (do not resuscitate) 10/01/2020   Thrombocytosis 10/01/2020   Body mass index (BMI) of 19.0-19.9 in adult 05/15/2020   CKD (chronic kidney disease) stage 3, GFR 30-59 ml/min (HCC) 05/15/2020   Labile hypertension 06/16/2018   FHx: heart disease 06/16/2018   Former smoker 06/16/2018   Abnormal glucose 06/16/2018   Anxiety 03/04/2018   Medication management 04/08/2014   Vitamin D deficiency 07/01/2013   Hyperlipidemia, mixed 07/01/2013   LBBB (left bundle branch block) 02/20/2010    Orientation RESPIRATION BLADDER Height & Weight     Self, Time, Situation, Place  Normal Continent Weight: 50.3 kg Height:  5\' 2"  (157.5 cm)  BEHAVIORAL SYMPTOMS/MOOD NEUROLOGICAL BOWEL NUTRITION STATUS      Continent Diet  AMBULATORY STATUS COMMUNICATION OF NEEDS Skin   Supervision Verbally  Normal                       Personal Care Assistance Level of Assistance  Bathing, Feeding, Dressing Bathing Assistance: Limited assistance Feeding assistance: Independent Dressing Assistance: Independent     Functional Limitations Info  Sight, Hearing, Speech Sight Info: Impaired (wears glasses) Hearing Info: Impaired (Hard of hearing - does not weat hearing aide) Speech Info: Adequate    SPECIAL CARE FACTORS FREQUENCY  PT (By licensed PT), OT (By licensed OT)     PT Frequency: 3 times per week OT Frequency: 3 times per week            Contractures Contractures Info: Not present    Additional Factors Info  Code Status, Allergies, Psychotropic Code Status Info: DNR Allergies Info: Advil, Atenolol, Tape Psychotropic Info: Seroquel         Current Medications (02/26/2023):  This is the current hospital active medication list Current Facility-Administered Medications  Medication Dose Route Frequency Provider Last Rate Last Admin   0.9 %  sodium chloride infusion   Intravenous Continuous Russella Dar, NP 100 mL/hr at 02/26/23 0646 New Bag at 02/26/23 0646   acetaminophen (TYLENOL) tablet 650 mg  650 mg Oral Q6H PRN Russella Dar, NP       Or   acetaminophen (TYLENOL) suppository 650 mg  650 mg Rectal Q6H PRN Russella Dar, NP       apixaban Everlene Balls) tablet 2.5 mg  2.5 mg Oral BID Russella Dar,  NP   2.5 mg at 02/26/23 1017   ipratropium-albuterol (DUONEB) 0.5-2.5 (3) MG/3ML nebulizer solution 3 mL  3 mL Nebulization Q6H PRN Russella Dar, NP       latanoprost (XALATAN) 0.005 % ophthalmic solution 1 drop  1 drop Both Eyes QHS Russella Dar, NP       ondansetron [facility]) tablet 4 mg  4 mg Oral Q6H PRN Russella Dar, NP       Or   ondansetron [facility]) injection 4 mg  4 mg Intravenous Q6H PRN Russella Dar, NP       QUEtiapine (SEROQUEL XR) 24 hr tablet 50 mg  50 mg Oral Q lunch Russella Dar, NP       sodium chloride flush (NS) 0.9 %  injection 3 mL  3 mL Intravenous Q12H Russella Dar, NP         Discharge Medications: Please see discharge summary for a list of discharge medications.  Relevant Imaging Results:  Relevant Lab Results:   Additional Information SS# [id number]  [name], RN

## 2023-02-26 NOTE — ED Notes (Signed)
ED TO INPATIENT HANDOFF REPORT  ED Nurse Name and Phone #: Minerva Areola [phone]  S Name/Age/Gender Leslie [name] 89 y.o. female Room/Bed: 027C/027C  Code Status   Code Status: DNR  Home/SNF/Other Home Patient oriented to: self, place, time, and situation Is this baseline? Yes   Triage Complete: Triage complete  Chief Complaint Intractable diarrhea [R19.7]  Triage Note Pt from home by ambulance hypotensive,tachycardic, diahrea,weakness. Alert and orientated.ambulatory. skin warm and dry. 103/60in room.    Allergies Allergies  Allergen Reactions   Adhesive [Tape]    Advil [Ibuprofen]    Atenolol     Level of Care/Admitting Diagnosis ED Disposition     ED Disposition  Admit   Condition  --   Comment  Hospital Area: [facility] [[id number]]  Level of Care: Telemetry Medical [104]  May place patient in observation at [facility] or [facility] if equivalent level of care is available:: Yes  Covid Evaluation: Symptomatic Person Under Investigation (PUI) or recent exposure (last 10 days) *Testing Required*  Diagnosis: Intractable diarrhea [1191478]  Admitting Physician: Almon Hercules [[id number]]  Attending Physician: Almon Hercules [[id number]]          B Medical/Surgery History Past Medical History:  Diagnosis Date   Anxiety disorder    Cardiomyopathy (HCC)    Glaucoma    Hearing loss    HTN (hypertension)    Hyperglycemia    Hyperlipidemia    LBBB (left bundle branch block)    Stroke (cerebrum) (HCC)    Vitamin D deficiency    Past Surgical History:  Procedure Laterality Date   GLAUCOMA SURGERY  2013   TONSILLECTOMY       A IV Location/Drains/Wounds Patient Lines/Drains/Airways Status     Active Line/Drains/Airways     Name Placement date Placement time Site Days   Peripheral IV 02/25/23 20 G Left Antecubital 02/25/23  2240  Antecubital  1            Intake/Output Last 24 hours No intake or output data in the 24 hours ending 02/26/23  0654  Labs/Imaging Results for orders placed or performed during the hospital encounter of 02/25/23 (from the past 48 hour(s))  Comprehensive metabolic panel     Status: Abnormal   Collection Time: 02/25/23 10:47 PM  Result Value Ref Range   Sodium 134 (L) 135 - 145 mmol/L   Potassium 3.9 3.5 - 5.1 mmol/L   Chloride 101 98 - 111 mmol/L   CO2 22 22 - 32 mmol/L   Glucose, Bld 103 (H) 70 - 99 mg/dL    Comment: Glucose reference range applies only to samples taken after fasting for at least 8 hours.   BUN 25 (H) 8 - 23 mg/dL   Creatinine, Ser 6.96 (H) 0.44 - 1.00 mg/dL   Calcium 8.4 (L) 8.9 - 10.3 mg/dL   Total Protein 4.9 (L) 6.5 - 8.1 g/dL   Albumin 2.3 (L) 3.5 - 5.0 g/dL   AST 16 15 - 41 U/L   ALT 11 0 - 44 U/L   Alkaline Phosphatase 43 38 - 126 U/L   Total Bilirubin 1.6 (H) 0.3 - 1.2 mg/dL   GFR, Estimated 39 (L) >60 mL/min    Comment: (NOTE) Calculated using the CKD-EPI Creatinine Equation (2021)    Anion gap 11 5 - 15    Comment: Performed at [facility], [address], [place], [place] [address]  CBC with Differential     Status: Abnormal  Collection Time: 02/25/23 10:47 PM  Result Value Ref Range   WBC 14.6 (H) 4.0 - 10.5 K/uL   RBC 3.37 (L) 3.87 - 5.11 MIL/uL   Hemoglobin 9.9 (L) 12.0 - 15.0 g/dL   HCT 29.5 (L) 62.1 - 30.8 %   MCV 88.7 80.0 - 100.0 fL   MCH 29.4 26.0 - 34.0 pg   MCHC 33.1 30.0 - 36.0 g/dL   RDW 65.7 (H) 84.6 - 96.2 %   Platelets 460 (H) 150 - 400 K/uL   nRBC 0.0 0.0 - 0.2 %   Neutrophils Relative % 86 %   Neutro Abs 12.6 (H) 1.7 - 7.7 K/uL   Lymphocytes Relative 5 %   Lymphs Abs 0.7 0.7 - 4.0 K/uL   Monocytes Relative 7 %   Monocytes Absolute 1.1 (H) 0.1 - 1.0 K/uL   Eosinophils Relative 0 %   Eosinophils Absolute 0.0 0.0 - 0.5 K/uL   Basophils Relative 1 %   Basophils Absolute 0.1 0.0 - 0.1 K/uL   Immature Granulocytes 1 %   Abs Immature Granulocytes 0.07 0.00 - 0.07 K/uL    Comment: Performed at [facility], [address], [place], [place] [address]  Protime-INR     Status: Abnormal   Collection Time: 02/25/23 10:47 PM  Result Value Ref Range   Prothrombin Time 25.8 (H) 11.4 - 15.2 seconds   INR 2.3 (H) 0.8 - 1.2    Comment: (NOTE) INR goal varies based on device and disease states. Performed at [facility], [address], [place], [place] [address] CORRECTED ON 08/14 AT 0055: PREVIOUSLY REPORTED AS 2.3   APTT     Status: None   Collection Time: 02/25/23 10:47 PM  Result Value Ref Range   aPTT 34 24 - 36 seconds    Comment: Performed at [facility], [address], [place], [place] [address]  I-Stat Lactic Acid, ED     Status: Abnormal   Collection Time: 02/25/23 10:52 PM  Result Value Ref Range   Lactic Acid, Venous 2.4 (HH) 0.5 - 1.9 mmol/L   Comment NOTIFIED PHYSICIAN   I-Stat Lactic Acid, ED     Status: None   Collection Time: 02/26/23 12:57 AM  Result Value Ref Range   Lactic Acid, Venous 1.5 0.5 - 1.9 mmol/L   CT ABDOMEN PELVIS W CONTRAST  Result Date: 02/26/2023 CLINICAL DATA:  Acute abdominal pain EXAM: CT ABDOMEN AND PELVIS WITH CONTRAST TECHNIQUE: Multidetector CT imaging of the abdomen and pelvis was performed using the standard protocol following bolus administration of intravenous contrast. RADIATION DOSE REDUCTION: This exam was performed according to the departmental dose-optimization program which includes automated exposure control, adjustment of the mA and/or kV according to patient size and/or use of iterative reconstruction technique. CONTRAST:  55mL OMNIPAQUE IOHEXOL 350 MG/ML SOLN COMPARISON:  Partial comparison to lumbar spine radiographs dated 12/02/2022 FINDINGS: Lower chest: Lung bases are clear. Hepatobiliary: Left renal parenchyma atrophy. Liver is otherwise within normal limits. Gallbladder is unremarkable. No intrahepatic or extrahepatic ductal dilatation. Pancreas: Within normal limits. Spleen: Within normal limits. Adrenals/Urinary Tract: Adrenal glands are  within normal limits. Kidneys are within normal limits.  No hydronephrosis. Bladder is underdistended but unremarkable. Stomach/Bowel: Stomach is within normal limits. No evidence of bowel obstruction. Appendix is not discretely visualized. Moderate rectal stool burden with mild rectal wall thickening and perirectal stranding (series 3/image 81), suggesting fecal impaction with mild stercoral colitis. Vascular/Lymphatic: No evidence of abdominal aortic aneurysm.  Atherosclerotic calcifications of the abdominal aorta and branch vessels, although vessels remain patent. No suspicious abdominopelvic lymphadenopathy. Reproductive: Status post hysterectomy. No adnexal masses. Other: No abdominopelvic ascites. Musculoskeletal: Degenerative changes of the visualized thoracolumbar spine. Moderate compression fracture deformity at T12, unchanged prior. IMPRESSION: Mild fecal impaction with stercoral colitis. Additional ancillary findings as above. Electronically Signed   By: Charline Bills M.D.   On: 02/26/2023 01:47   DG Chest Port 1 View  Result Date: 02/25/2023 CLINICAL DATA:  Question of sepsis.  Evaluate for abnormality. EXAM: PORTABLE CHEST 1 VIEW COMPARISON:  10/01/2020 FINDINGS: Shallow inspiration. Cardiac enlargement. Lungs are clear. No pleural effusions. No pneumothorax. Mediastinal contours appear intact. IMPRESSION: Cardiac enlargement.  No evidence of active pulmonary disease. Electronically Signed   By: Burman Nieves M.D.   On: 02/25/2023 23:08    Pending Labs Unresulted Labs (From admission, onward)     Start     Ordered   02/27/23 0500  Comprehensive metabolic panel  Tomorrow morning,   R        02/26/23 0637   02/27/23 0500  CBC  Tomorrow morning,   R        02/26/23 9147   02/26/23 8295  Urine Culture (for pregnant, neutropenic or urologic patients or patients with an indwelling urinary catheter)  (Urine Labs)  Once,   R       Question:  Indication  Answer:  Dysuria   02/26/23 0651    02/26/23 0635  Respiratory (~20 pathogens) panel by PCR  (Respiratory panel by PCR (~20 pathogens, ~24 hr TAT)  w precautions)  Once,   URGENT        02/26/23 0634   02/26/23 6213  Gastrointestinal Panel by PCR , Stool  (Gastrointestinal Panel by PCR, Stool                                                                                                                                                     **Does Not include CLOSTRIDIUM DIFFICILE testing. **If CDIFF testing is needed, place order from the "C Difficile Testing" order set.**)  Once,   URGENT        02/26/23 0633   02/26/23 0634  Lipase, blood  Once,   STAT        02/26/23 0865   02/26/23 0634  SARS Coronavirus 2 by RT PCR (hospital order, performed in [facility] hospital lab) *cepheid single result test* Anterior Nasal Swab  (Tier 2 - SARS Coronavirus 2 by RT PCR (hospital order, performed in [facility] hospital lab) *cepheid single result test*)  Once,   URGENT        02/26/23 0633   02/25/23 2241  Blood Culture (routine x 2)  (Undifferentiated presentation (screening labs and basic nursing orders))  BLOOD CULTURE X 2,   STAT  02/25/23 2241   02/25/23 2241  Urinalysis, w/ Reflex to Culture (Infection Suspected) -Urine, Clean Catch  (Undifferentiated presentation (screening labs and basic nursing orders))  ONCE - URGENT,   URGENT       Question:  Specimen Source  Answer:  Urine, Clean Catch   02/25/23 2241   02/25/23 2241  C Difficile Quick Screen w PCR reflex  (C Difficile quick screen w PCR reflex panel )  Once, for 24 hours,   URGENT       References:    CDiff Information Tool   02/25/23 2241            Vitals/Pain Today's Vitals   02/26/23 0223 02/26/23 0453 02/26/23 0622 02/26/23 0630  BP:  (!) 109/56  (!) 120/58  Pulse:  88    Resp:  17  16  Temp: 97.8 F (36.6 C)  97.8 F (36.6 C)   TempSrc: Oral     SpO2:  100%    Weight:      Height:      PainSc:        Isolation Precautions Droplet  precaution  Medications Medications  sodium chloride flush (NS) 0.9 % injection 3 mL (has no administration in time range)  acetaminophen (TYLENOL) tablet 650 mg (has no administration in time range)    Or  acetaminophen (TYLENOL) suppository 650 mg (has no administration in time range)  ondansetron (ZOFRAN) tablet 4 mg (has no administration in time range)    Or  ondansetron (ZOFRAN) injection 4 mg (has no administration in time range)  0.9 %  sodium chloride infusion ( Intravenous New Bag/Given 02/26/23 0646)  QUEtiapine (SEROQUEL XR) 24 hr tablet 50 mg (has no administration in time range)  apixaban (ELIQUIS) tablet 2.5 mg (has no administration in time range)  latanoprost (XALATAN) 0.005 % ophthalmic solution 1 drop (has no administration in time range)  ipratropium-albuterol (DUONEB) 0.5-2.5 (3) MG/3ML nebulizer solution 3 mL (has no administration in time range)  0.9 %  sodium chloride infusion (0 mLs Intravenous Stopped 02/26/23 0223)  iohexol (OMNIPAQUE) 350 MG/ML injection 55 mL (55 mLs Intravenous Contrast Given 02/26/23 0141)  lidocaine (XYLOCAINE) 2 % jelly 1 Application (1 Application Topical Given 02/26/23 0232)    Mobility walks     Focused Assessments Cardiac Assessment Handoff:    Lab Results  Component Value Date   CKTOTAL 55 10/01/2020   No results found for: "DDIMER" Does the Patient currently have chest pain? No    R Recommendations: See Admitting Provider Note  Report given to:   Additional Notes: cooperative, dnr, droplet

## 2023-02-26 NOTE — ED Notes (Signed)
Have attempted both urine and stool sample with no results yet

## 2023-02-26 NOTE — Plan of Care (Signed)

## 2023-02-26 NOTE — Evaluation (Signed)
Occupational Therapy Evaluation Patient Details Name: Leslie [name] MRN: [id number] DOB: 1927-06-01 Today's Date: 02/26/2023   History of Present Illness Patient is a 89 yo female presenting to the ED with diarrhea on 02/25/23. Found to be tachycardic and hypotensive. Elevated WBC and fecal impaction noted. PMH includes: permanent atrial fibrillation, labile hypertension, dyslipidemia, prediabetes, history of low back pain secondary to compression fractures, stroke dyslipidemia, and glaucoma.   Clinical Impression   Prior to this admission, patient living alone with personal care attendants ([employer]) assisting with minor IADL management, but patient refusing ADL assistance. Son present for evaluation, and endorses falls at home in the last six months, and has not been getting in and out of the shower and declining assist to complete bathing. Currently, Patient presenting with weakness and decreased activity tolerance, and need for increased assist to complete ADLs. Patient contact gaurd for ADL management, but requires min A to transfers and functional mobility (hand held assist provided). OT recommending HHOT at discharge, OT will continue to follow acutely.      If plan is discharge home, recommend the following: A little help with walking and/or transfers;A little help with bathing/dressing/bathroom;Assist for transportation    Functional Status Assessment  Patient has had a recent decline in their functional status and demonstrates the ability to make significant improvements in function in a reasonable and predictable amount of time.  Equipment Recommendations  None recommended by OT (defer to next venue)    Recommendations for Other Services       Precautions / Restrictions Precautions Precautions: Fall Restrictions Weight Bearing Restrictions: No      Mobility Bed Mobility Overal bed mobility: Needs Assistance Bed Mobility: Supine to Sit, Sit to Supine      Supine to sit: Supervision Sit to supine: Min assist   General bed mobility comments: increased time to come into sitting, use of bed rails, patient requiring min A to bring BLEs back into bed    Transfers Overall transfer level: Needs assistance Equipment used: 1 person hand held assist Transfers: Sit to/from Stand Sit to Stand: Min assist           General transfer comment: min A to come into standing, ambulating to bathroom with hand held assist and back to bed after completing ADLs at the sink      Balance Overall balance assessment: Mild deficits observed, not formally tested                                         ADL either performed or assessed with clinical judgement   ADL Overall ADL's : Needs assistance/impaired Eating/Feeding: Set up;Sitting   Grooming: Wash/dry hands;Set up;Standing   Upper Body Bathing: Contact guard assist;Sitting   Lower Body Bathing: Minimal assistance;Sit to/from stand;Sitting/lateral leans   Upper Body Dressing : Contact guard assist;Sitting   Lower Body Dressing: Contact guard assist;Sit to/from stand;Sitting/lateral leans Lower Body Dressing Details (indicate cue type and reason): able to doff and donn socks Toilet Transfer: Minimal assistance;Ambulation;Regular Toilet   Toileting- Clothing Manipulation and Hygiene: Contact guard assist;Sit to/from stand;Sitting/lateral lean       Functional mobility during ADLs: Contact guard assist General ADL Comments: Patient presenting with weakness and decreased activity tolerance, and need for increased assist to complete ADLs. Patient contact gaurd for ADL management, but requires min A to transfers and functional mobility (hand held assist provided). OT  recommending HHOT at discharge, OT will continue to follow acutely.     Vision Baseline Vision/History: 1 Wears glasses Ability to See in Adequate Light: 0 Adequate Patient Visual Report: No change from  baseline Vision Assessment?: No apparent visual deficits     Perception Perception: Not tested       Praxis Praxis: Not tested       Pertinent Vitals/Pain Pain Assessment Pain Assessment: No/denies pain     Extremity/Trunk Assessment Upper Extremity Assessment Upper Extremity Assessment: Generalized weakness   Lower Extremity Assessment Lower Extremity Assessment: Defer to PT evaluation   Cervical / Trunk Assessment Cervical / Trunk Assessment: Kyphotic (minimally)   Communication Communication Communication: Hearing impairment   Cognition Arousal: Alert Behavior During Therapy: WFL for tasks assessed/performed Overall Cognitive Status: Within Functional Limits for tasks assessed                                 General Comments: Minimally hard of hearing     General Comments       Exercises     Shoulder Instructions      Home Living Family/patient expects to be discharged to:: Private residence Living Arrangements: Alone Available Help at Discharge: Family;Personal care attendant Type of Home: House Home Access: Stairs to enter Secretary/administrator of Steps: 2 Entrance Stairs-Rails: Right Home Layout: One level     Bathroom Shower/Tub: Chief Strategy Officer: Standard Bathroom Accessibility: Yes   Home Equipment: Information systems manager   Additional Comments: 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, sons are trying to get her into [facility] and are awaiting paperwork from MD      Prior Functioning/Environment Prior Level of Function : History of Falls (last six months);Needs assist             Mobility Comments: furniture surfer, fiercely independent ADLs Comments: has not been participating in higher level ADLs (showering), losing weight quickly        OT Problem List: Decreased strength;Decreased activity tolerance;Impaired balance (sitting and/or standing);Decreased safety awareness      OT  Treatment/Interventions: Self-care/ADL training;Therapeutic exercise;Energy conservation;DME and/or AE instruction;Manual therapy;Therapeutic activities;Patient/family education    OT Goals(Current goals can be found in the care plan section) Acute Rehab OT Goals Patient Stated Goal: to go home OT Goal Formulation: With patient Time For Goal Achievement: 03/12/23 Potential to Achieve Goals: Good  OT Frequency: Min 1X/week    Co-evaluation              AM-PAC OT "6 Clicks" Daily Activity     Outcome Measure Help from another person eating meals?: None Help from another person taking care of personal grooming?: None Help from another person toileting, which includes using toliet, bedpan, or urinal?: A Little Help from another person bathing (including washing, rinsing, drying)?: A Little Help from another person to put on and taking off regular upper body clothing?: None Help from another person to put on and taking off regular lower body clothing?: A Little 6 Click Score: 21   End of Session Equipment Utilized During Treatment: Gait belt Nurse Communication: Mobility status  Activity Tolerance: Patient tolerated treatment well Patient left: in bed;with call bell/phone within reach;with chair alarm set;with family/visitor present  OT Visit Diagnosis: Unsteadiness on feet (R26.81);Muscle weakness (generalized) (M62.81);History of falling (Z91.81)                Time: 0981-1914  OT Time Calculation (min): 21 min Charges:  OT General Charges $OT Visit: 1 Visit OT Evaluation $OT Eval Moderate Complexity: 1 Mod  Pollyann Glen E. , OTR/L Acute Rehabilitation Services [phone]   [name] 02/26/2023, 12:46 PM

## 2023-02-26 NOTE — ED Notes (Signed)
Attempted to collect urine but there was bowel in the urine when patient used the restroom. Unable to collect a clean urine at this time

## 2023-02-26 NOTE — Care Management Obs Status (Cosign Needed)
MEDICARE OBSERVATION STATUS NOTIFICATION   Patient Details  Name: Leslie [name] MRN: [id number] Date of Birth: Jul 19, 1926   Medicare Observation Status Notification Given:  Yes    [name], RN 02/26/2023, 12:57 PM

## 2023-02-26 NOTE — Progress Notes (Signed)
PPD implanted left forearm and area marked with skin marker

## 2023-02-26 NOTE — Progress Notes (Signed)
OT Cancellation Note  Patient Details Name: Leslie [name] MRN: [id number] DOB: 01-Nov-1926   Cancelled Treatment:    Reason Eval/Treat Not Completed: Patient at procedure or test/ unavailable Patient with ECHO in room, OT will follow back to complete evaluation.   Pollyann Glen E. , OTR/L Acute Rehabilitation Services [phone]   [name] 02/26/2023, 11:15 AM

## 2023-02-27 DIAGNOSIS — R197 Diarrhea, unspecified: Secondary | ICD-10-CM | POA: Diagnosis not present

## 2023-02-27 LAB — CBC
HCT: 27.6 % — ABNORMAL LOW (ref 36.0–46.0)
Hemoglobin: 9.3 g/dL — ABNORMAL LOW (ref 12.0–15.0)
MCH: 29.4 pg (ref 26.0–34.0)
MCHC: 33.7 g/dL (ref 30.0–36.0)
MCV: 87.3 fL (ref 80.0–100.0)
Platelets: 447 10*3/uL — ABNORMAL HIGH (ref 150–400)
RBC: 3.16 MIL/uL — ABNORMAL LOW (ref 3.87–5.11)
RDW: 16.7 % — ABNORMAL HIGH (ref 11.5–15.5)
WBC: 7.9 10*3/uL (ref 4.0–10.5)
nRBC: 0 % (ref 0.0–0.2)

## 2023-02-27 LAB — COMPREHENSIVE METABOLIC PANEL
ALT: 10 U/L (ref 0–44)
AST: 11 U/L — ABNORMAL LOW (ref 15–41)
Albumin: 1.8 g/dL — ABNORMAL LOW (ref 3.5–5.0)
Alkaline Phosphatase: 37 U/L — ABNORMAL LOW (ref 38–126)
Anion gap: 7 (ref 5–15)
BUN: 21 mg/dL (ref 8–23)
CO2: 23 mmol/L (ref 22–32)
Calcium: 7.9 mg/dL — ABNORMAL LOW (ref 8.9–10.3)
Chloride: 106 mmol/L (ref 98–111)
Creatinine, Ser: 0.95 mg/dL (ref 0.44–1.00)
GFR, Estimated: 56 mL/min — ABNORMAL LOW (ref >60)
Glucose, Bld: 70 mg/dL (ref 70–99)
Potassium: 3.4 mmol/L — ABNORMAL LOW (ref 3.5–5.1)
Sodium: 136 mmol/L (ref 135–145)
Total Bilirubin: 1.5 mg/dL — ABNORMAL HIGH (ref 0.3–1.2)
Total Protein: 4.2 g/dL — ABNORMAL LOW (ref 6.5–8.1)

## 2023-02-27 LAB — PHOSPHORUS: Phosphorus: 2.6 mg/dL (ref 2.5–4.6)

## 2023-02-27 LAB — MAGNESIUM: Magnesium: 1.7 mg/dL (ref 1.7–2.4)

## 2023-02-27 MED ORDER — VITAMIN D 25 MCG (1000 UNIT) PO TABS
3000.0000 [IU] | ORAL_TABLET | Freq: Every day | ORAL | Status: DC
  Administered 2023-02-27 – 2023-02-28 (×2): 3000 [IU] via ORAL
  Filled 2023-02-27 (×2): qty 3

## 2023-02-27 MED ORDER — METOPROLOL SUCCINATE ER 25 MG PO TB24
25.0000 mg | ORAL_TABLET | Freq: Every morning | ORAL | Status: DC
  Administered 2023-02-28: 25 mg via ORAL
  Filled 2023-02-27: qty 1

## 2023-02-27 MED ORDER — POTASSIUM CHLORIDE CRYS ER 20 MEQ PO TBCR
20.0000 meq | EXTENDED_RELEASE_TABLET | Freq: Once | ORAL | Status: AC
  Administered 2023-02-27: 20 meq via ORAL
  Filled 2023-02-27: qty 1

## 2023-02-27 NOTE — Progress Notes (Signed)
Physical Therapy Treatment Patient Details Name: Leslie [name] MRN: [id number] DOB: [birth year] Today's Date: 02/27/2023   History of Present Illness Patient is a 89 yo female presenting to the ED with diarrhea on 02/25/23. Found to be tachycardic and hypotensive. Elevated WBC and fecal impaction noted. PMH includes: permanent atrial fibrillation, labile hypertension, dyslipidemia, prediabetes, history of low back pain secondary to compression fractures, stroke dyslipidemia, and glaucoma.    PT Comments  Pt greeted resting in bed and agreeable to session with continued progress towards acute goals. Pt requiring grossly min A for bed mobility, functional transfers and gait with HHA. Pt progressing gait distance this session reaching for rail in hall for support in addition to [facility] however pt able to progress gait speed and stability for last 77' without rial support. Pt able to complete x10 serial sit<>stand for increased LE strength with CGA to complete. Pt sons present at bedside and supportive. Current plan remains appropriate and pt continues to benefit from skilled PT services to progress toward functional mobility goals.      If plan is discharge home, recommend the following: A little help with walking and/or transfers;A little help with bathing/dressing/bathroom;Assistance with cooking/housework;Assist for transportation   Can travel by private vehicle        Equipment Recommendations  None recommended by PT    Recommendations for Other Services       Precautions / Restrictions Precautions Precautions: Fall Restrictions Weight Bearing Restrictions: No     Mobility  Bed Mobility Overal bed mobility: Needs Assistance Bed Mobility: Supine to Sit, Sit to Supine     Supine to sit: Min assist Sit to supine: Supervision   General bed mobility comments: Increased time/effort, pt asking for HHA assist this session to eleavte trunk and scoot out to EOB    Transfers Overall transfer  level: Needs assistance Equipment used: None Transfers: Sit to/from Stand Sit to Stand: Min assist, Contact guard assist           General transfer comment: Light minA to come to standing, fading to CGA with pt able to perform serial sit<>stand x10 at end of session    Ambulation/Gait Ambulation/Gait assistance: Min assist Gait Distance (Feet): 150 Feet Assistive device: 1 person hand held assist Gait Pattern/deviations: Step-through pattern, Decreased stride length Gait velocity: decreased     General Gait Details: Dynamic instability, requiring HHA with pt reaching out opposing hand for support on rail in hall for initital 75', second 26' pt without need for addtional rail support   Stairs             Wheelchair Mobility     Tilt Bed    Modified Rankin (Stroke Patients Only)       Balance Overall balance assessment: Mild deficits observed, not formally tested                                          Cognition Arousal: Alert Behavior During Therapy: WFL for tasks assessed/performed Overall Cognitive Status: Within Functional Limits for tasks assessed                                 General Comments: Minimally hard of hearing, some confusion on location of room        Exercises Other Exercises Other Exercises: serial sit<>stand x10 with hands  on lap    General Comments General comments (skin integrity, edema, etc.): VSS on RA, pt sons present at bedside and supportive      Pertinent Vitals/Pain Pain Assessment Pain Assessment: Faces Faces Pain Scale: No hurt Pain Intervention(s): Monitored during session    Home Living                          Prior Function            PT Goals (current goals can now be found in the care plan section) Acute Rehab PT Goals Patient Stated Goal: get stronger PT Goal Formulation: With patient Time For Goal Achievement: 03/12/23 Progress towards PT goals: Progressing  toward goals    Frequency    Min 1X/week      PT Plan      Co-evaluation              AM-PAC PT "6 Clicks" Mobility   Outcome Measure  Help needed turning from your back to your side while in a flat bed without using bedrails?: None Help needed moving from lying on your back to sitting on the side of a flat bed without using bedrails?: A Little Help needed moving to and from a bed to a chair (including a wheelchair)?: A Little Help needed standing up from a chair using your arms (e.g., wheelchair or bedside chair)?: A Little Help needed to walk in hospital room?: A Little Help needed climbing 3-5 steps with a railing? : A Lot 6 Click Score: 18    End of Session Equipment Utilized During Treatment: Gait belt Activity Tolerance: Patient tolerated treatment well Patient left: in bed;with call bell/phone within reach;with family/visitor present Nurse Communication: Mobility status PT Visit Diagnosis: Unsteadiness on feet (R26.81);History of falling (Z91.81)     Time: 9563-8756 PT Time Calculation (min) (ACUTE ONLY): 23 min  Charges:    $Gait Training: 8-22 mins $Therapeutic Activity: 8-22 mins PT General Charges $$ ACUTE PT VISIT: 1 Visit                      R. PTA Acute Rehabilitation Services Office: [phone]   Catalina Antigua 02/27/2023, 11:08 AM

## 2023-02-27 NOTE — Progress Notes (Signed)
[facility] PROGRESS NOTE  Leslie [name] (DOB: 1927/04/30) UVO:[id number] PCP: Lucky Cowboy, MD  Brief Narrative: Leslie [name] is a 89 y.o. female with a history of HFrEF (EF 20-25% in 2022), CVA, CKD-3A, HTN, chronic back pain, permanent A-fib on eliquis presenting with ongoing diarrhea for 2 weeks that has gotten worse over the last few days and admitted for intractable diarrhea and fecal impaction with sterile coral colitis on CT. Disimpacted in ED and started on bowel regimen. No further diarrhea. Will require ALF placement for safe disposition. Advancing diet as tolerated 8/15.   Subjective: No abd pain, diarrhea, rectal pain, chest pain or dyspnea or other complaints. Ate clears without issue this AM.  Objective: BP 127/68 (BP Location: Right Arm)   Pulse 77   Temp 97.6 F (36.4 C) (Oral)   Resp 16   Ht 5\' 2"  (1.575 m)   Wt 50.3 kg   SpO2 97%   BMI 20.30 kg/m   Gen: Elderly female in no distress Pulm: Clear, nonlabored  CV: RRR, NSR w/LBBB on ECG and tele, without pitting edema or JVD GI: Soft, NT, ND, +BS Neuro: Alert and oriented. No new focal deficits. Ext: Warm, no deformities. Skin: No rashes, lesions or ulcers on visualized skin   Assessment & Plan: Diarrhea in context of fecal impaction/stercoral colitis: diarrhea has resolved after disimpaction. Leukocytosis, lactic acid elevation have resolved without antibiotics. Will cancel GI pathogen panel, C.diff and precautions - Continue regular bowel regimen - Advance diet as tolerated now.   SIRS physiology: Secondary to volume depletion from diarrhea. Sepsis ruled out.    PAF: In NSR this admission. - Restart metoprolol and continue eliquis, DC tele   Chronic HFrEF: No recent echo, but last LVEF 20-25%. Will not institute medication changes at this time, can repeat echo and f/u with cardiology as outpatient. Continue metoprolol. Stop IVF now that she's taking po.   AKI on CKD 3b: Creatinine improved with IVF  to baseline ~0.9, representing CrCl around 47ml/min.  - Avoid nephrotoxins. Renally/age/wt dosed eliquis.    Hypokalemia:  - Supplement  Labile hypertension: Primarily on diet control - Metoprolol   Chronic low back pain secondary to compression fractures, vitamin D deficiency: Recent imaging reveals T5, T8 and T12 compression fractures noted from 03/11/2006 but also age-indeterminate.  She also has multilevel degenerative disc disease - Continue tylenol prn - PT/OT. Family interested in placing patient in assisted living facility-there is also a mild degree of short-term memory loss. TOC on board, PPD placed 8/14, will be read 8/16 and will have bed available 8/16.    Thrombocytosis: Follows with Dr. Pamelia Hoit. Stable.   Dyslipidemia: Continue statin at discharge   Prediabetes: HbA1c April 2024 was 5.6%  Tyrone Nine, MD [facility] [email or web address] 02/27/2023, 3:01 PM

## 2023-02-27 NOTE — TOC Progression Note (Signed)
Transition of Care [facility]) - Progression Note    Patient Details  Name: DERYA POYNER MRN: [id number] Date of Birth: [birth year]  [facility]) CM/SW Contact  [name], RN Phone Number: 02/27/2023, 11:56 AM  Clinical Narrative:    I met with the patient and sons at the bedside and [facility] has offered a ready bed to the patient with potential to discharge to the facility tomorrow after 4 pm once the TB skin test is ready and recorded in the progress notes.   Expected Discharge Plan: Assisted Living Barriers to Discharge: Continued Medical Work up  Expected Discharge Plan and Services   Discharge Planning Services: CM Consult Post Acute Care Choice: Resumption of Svcs/PTA Provider (Family needs assistance with ALF) Living arrangements for the past 2 months: Single Family Home                                       Social Determinants of Health (SDOH) Interventions SDOH Screenings   Food Insecurity: No Food Insecurity (02/26/2023)  Housing: Low Risk  (02/26/2023)  Transportation Needs: No Transportation Needs (02/26/2023)  Utilities: Not At Risk (02/26/2023)  Depression (PHQ2-9): Low Risk  (11/11/2022)  Tobacco Use: Medium Risk (02/26/2023)    Readmission Risk Interventions     No data to display

## 2023-02-27 NOTE — Plan of Care (Signed)

## 2023-02-28 DIAGNOSIS — R197 Diarrhea, unspecified: Secondary | ICD-10-CM | POA: Diagnosis not present

## 2023-02-28 MED ORDER — ENSURE ENLIVE PO LIQD: 237.0000 mL | Freq: Two times a day (BID) | ORAL | Status: DC

## 2023-02-28 NOTE — Progress Notes (Signed)
Physical Therapy Treatment Patient Details Name: Leslie [name] MRN: [id number] DOB: [birth year] Today's Date: 02/28/2023   History of Present Illness Patient is a 89 yo female presenting to the ED with diarrhea on 02/25/23. Found to be tachycardic and hypotensive. Elevated WBC and fecal impaction noted. PMH includes: permanent atrial fibrillation, labile hypertension, dyslipidemia, prediabetes, history of low back pain secondary to compression fractures, stroke dyslipidemia, and glaucoma.    PT Comments  Pt greeted resting in bed and agreeable to session with good progress towards acute goals. Pt performing gait with continued HHA this session for slightly increased distance, however pt continuing to require up to min A to steady and maintain balance as pt with R/L drift and some crossing of RLE over L. Pt agreeable to gait trial with rollator for support with pt able to ambulate full length of hall x2 with CGA fading to supervision with greatly increased stability and gait speed with no LOB noted, pt needing light cues for posture and anticipatory cueing x1 to avoid hitting wheel on obstacle. Pt was educated on continued rollator use to maximize functional independence, safety, and decrease risk for falls. Current plan remains appropriate to address deficits and maximize functional independence and decrease caregiver burden. Pt continues to benefit from skilled PT services to progress toward functional mobility goals.      If plan is discharge home, recommend the following: A little help with walking and/or transfers;A little help with bathing/dressing/bathroom;Assistance with cooking/housework;Assist for transportation   Can travel by private Psychologist, clinical (4 wheels)    Recommendations for Other Services       Precautions / Restrictions Precautions Precautions: Fall Restrictions Weight Bearing Restrictions: No     Mobility  Bed Mobility Overal bed  mobility: Needs Assistance Bed Mobility: Supine to Sit, Sit to Supine     Supine to sit: Min assist Sit to supine: Supervision   General bed mobility comments: very light assist to elevate trunk to sitting    Transfers Overall transfer level: Needs assistance Equipment used: None Transfers: Sit to/from Stand Sit to Stand: Min assist, Contact guard assist           General transfer comment: light min A to steady on rise    Ambulation/Gait Ambulation/Gait assistance: Min assist Gait Distance (Feet): 180 Feet (+ 400') Assistive device: 1 person hand held assist, Rollator (4 wheels) Gait Pattern/deviations: Step-through pattern, Decreased stride length Gait velocity: decreased     General Gait Details: Dynamic instability, requiring HHA with with pt demonstrating dift R/L in hall and some crossing of RLE past midline and variable gait speed, greatly improved gait stability, speed and distance with rollator use   Stairs             Wheelchair Mobility     Tilt Bed    Modified Rankin (Stroke Patients Only)       Balance Overall balance assessment: Mild deficits observed, not formally tested                                          Cognition Arousal: Alert Behavior During Therapy: WFL for tasks assessed/performed Overall Cognitive Status: Within Functional Limits for tasks assessed  General Comments: Minimally hard of hearing        Exercises      General Comments General comments (skin integrity, edema, etc.): VSS on RA      Pertinent Vitals/Pain Pain Assessment Pain Assessment: Faces Faces Pain Scale: No hurt Pain Intervention(s): Monitored during session    Home Living                          Prior Function            PT Goals (current goals can now be found in the care plan section) Acute Rehab PT Goals Patient Stated Goal: get stronger PT Goal Formulation:  With patient Time For Goal Achievement: 03/12/23 Progress towards PT goals: Progressing toward goals    Frequency    Min 1X/week      PT Plan      Co-evaluation              AM-PAC PT "6 Clicks" Mobility   Outcome Measure  Help needed turning from your back to your side while in a flat bed without using bedrails?: None Help needed moving from lying on your back to sitting on the side of a flat bed without using bedrails?: A Little Help needed moving to and from a bed to a chair (including a wheelchair)?: A Little Help needed standing up from a chair using your arms (e.g., wheelchair or bedside chair)?: A Little Help needed to walk in hospital room?: A Little Help needed climbing 3-5 steps with a railing? : A Lot 6 Click Score: 18    End of Session Equipment Utilized During Treatment: Gait belt Activity Tolerance: Patient tolerated treatment well Patient left: in bed;with call bell/phone within reach;with family/visitor present Nurse Communication: Mobility status PT Visit Diagnosis: Unsteadiness on feet (R26.81);History of falling (Z91.81)     Time: 5409-8119 PT Time Calculation (min) (ACUTE ONLY): 17 min  Charges:    $Gait Training: 8-22 mins PT General Charges $$ ACUTE PT VISIT: 1 Visit                     Sole Lengacher R. PTA Acute Rehabilitation Services Office: [phone]   Catalina Antigua 02/28/2023, 12:55 PM

## 2023-02-28 NOTE — Discharge Summary (Signed)
Physician Discharge Summary   Patient: Leslie [name] MRN: [id number] DOB: [birth year]  Admit date:     02/25/2023  Discharge date: 02/28/23  Discharge Physician: Tyrone Nine   PCP: Lucky Cowboy, MD   Recommendations at discharge:  Follow up with PCP in 1-2 weeks.  Continue regular bowel regimen to avoid recurrent fecal impaction.  Discharge Diagnoses: Principal Problem:   Intractable diarrhea  Hospital Course: Leslie [name] is a 89 y.o. female with a history of HFrEF (EF 20-25% in 2022), CVA, CKD-3A, HTN, chronic back pain, permanent A-fib on eliquis presenting with ongoing diarrhea for 2 weeks that has gotten worse over the last few days and admitted for intractable diarrhea and fecal impaction with sterile coral colitis on CT. Disimpacted in ED and started on bowel regimen. No further diarrhea. Will require ALF placement for safe disposition. Advancing diet was tolerated.   Assessment and Plan: Diarrhea in context of fecal impaction/stercoral colitis: diarrhea has resolved after disimpaction. Leukocytosis, lactic acid elevation have resolved without antibiotics. Will cancel GI pathogen panel, C.diff and precautions - Continue regular bowel regimen - Tolerating diet.   SIRS physiology: Secondary to volume depletion from diarrhea. Sepsis ruled out.    PAF: In NSR this admission. - Restarted metoprolol and continue eliquis, DC tele   Chronic HFrEF: No recent echo, but last LVEF 20-25%. Will not institute medication changes at this time, can repeat echo and f/u with cardiology as outpatient. Continue metoprolol.     AKI on CKD 3b: Creatinine improved with IVF to baseline ~0.9, representing CrCl around 38ml/min.  - Avoid nephrotoxins. Renally/age/wt dosed eliquis.    Hypokalemia:  - Supplement   Labile hypertension: Primarily on diet control - Metoprolol   Chronic low back pain secondary to compression fractures, vitamin D deficiency: Recent imaging reveals T5, T8 and T12  compression fractures noted from 03/11/2006 but also age-indeterminate.  She also has multilevel degenerative disc disease - Continue tylenol prn - PT/OT. Family interested in placing patient in assisted living facility-there is also a mild degree of short-term memory loss. TOC on board, PPD placed 8/14, this is negative at 48 hours on 8/16.   Thrombocytosis: Follows with Dr. Pamelia Hoit. Stable.   Dyslipidemia: Continue statin at discharge   Prediabetes: HbA1c April 2024 was 5.6%  Consultants: None Procedures performed: None  Disposition: Assisted living Diet recommendation: Heart healthy DISCHARGE MEDICATION: Allergies as of 02/28/2023       Reactions   Advil [ibuprofen] Other (See Comments)   Unknown per pt    Atenolol Other (See Comments)   Unknown per pt    Adhesive [tape] Rash        Medication List     TAKE these medications    atorvastatin 20 MG tablet Commonly known as: LIPITOR Take  1 tablet  Daily for Cholesterol What changed:  how much to take how to take this when to take this additional instructions   Eliquis 2.5 MG Tabs tablet Generic drug: apixaban TAKE 1 TABLET TWICE A DAY   latanoprost 0.005 % ophthalmic solution Commonly known as: XALATAN Place 1 drop into both eyes at bedtime.   metoprolol succinate 25 MG 24 hr tablet Commonly known as: TOPROL-XL Take  1 tablet  Daily  for Heart & BP What changed:  how much to take how to take this when to take this additional instructions   QUEtiapine 50 MG Tb24 24 hr tablet Commonly known as: SEROQUEL XR Take 1 tablet Daily at [facility] for Memory &  Concentration What changed:  how much to take how to take this when to take this additional instructions   Vitamin D3 25 MCG (1000 UT) Caps Take 3 capsules  (3,000 units) Daily What changed:  how much to take how to take this when to take this additional instructions        Follow-up Information     Lucky Cowboy, MD Follow up.   Specialty:  Internal Medicine Contact information: [address] [place] [address] [phone]                Discharge Exam: Ceasar Mons Weights   02/25/23 2239  Weight: 50.3 kg  BP 137/73 (BP Location: Right Arm)   Pulse 82   Temp 98.1 F (36.7 C) (Oral)   Resp 18   Ht 5\' 2"  (1.575 m)   Wt 50.3 kg   SpO2 94%   BMI 20.30 kg/m   Well-appearing elderly female in no distress Clear, nonlabored RRR Soft, NT, ND +BS PPD site on left forearm has no induration or erythema whatsoever  Condition at discharge: stable  The results of significant diagnostics from this hospitalization (including imaging, microbiology, ancillary and laboratory) are listed below for reference.   Imaging Studies: CT ABDOMEN PELVIS W CONTRAST  Result Date: 02/26/2023 CLINICAL DATA:  Acute abdominal pain EXAM: CT ABDOMEN AND PELVIS WITH CONTRAST TECHNIQUE: Multidetector CT imaging of the abdomen and pelvis was performed using the standard protocol following bolus administration of intravenous contrast. RADIATION DOSE REDUCTION: This exam was performed according to the departmental dose-optimization program which includes automated exposure control, adjustment of the mA and/or kV according to patient size and/or use of iterative reconstruction technique. CONTRAST:  55mL OMNIPAQUE IOHEXOL 350 MG/ML SOLN COMPARISON:  Partial comparison to lumbar spine radiographs dated 12/02/2022 FINDINGS: Lower chest: Lung bases are clear. Hepatobiliary: Left renal parenchyma atrophy. Liver is otherwise within normal limits. Gallbladder is unremarkable. No intrahepatic or extrahepatic ductal dilatation. Pancreas: Within normal limits. Spleen: Within normal limits. Adrenals/Urinary Tract: Adrenal glands are within normal limits. Kidneys are within normal limits.  No hydronephrosis. Bladder is underdistended but unremarkable. Stomach/Bowel: Stomach is within normal limits. No evidence of bowel obstruction. Appendix is not  discretely visualized. Moderate rectal stool burden with mild rectal wall thickening and perirectal stranding (series 3/image 81), suggesting fecal impaction with mild stercoral colitis. Vascular/Lymphatic: No evidence of abdominal aortic aneurysm. Atherosclerotic calcifications of the abdominal aorta and branch vessels, although vessels remain patent. No suspicious abdominopelvic lymphadenopathy. Reproductive: Status post hysterectomy. No adnexal masses. Other: No abdominopelvic ascites. Musculoskeletal: Degenerative changes of the visualized thoracolumbar spine. Moderate compression fracture deformity at T12, unchanged prior. IMPRESSION: Mild fecal impaction with stercoral colitis. Additional ancillary findings as above. Electronically Signed   By: Charline Bills M.D.   On: 02/26/2023 01:47   DG Chest Port 1 View  Result Date: 02/25/2023 CLINICAL DATA:  Question of sepsis.  Evaluate for abnormality. EXAM: PORTABLE CHEST 1 VIEW COMPARISON:  10/01/2020 FINDINGS: Shallow inspiration. Cardiac enlargement. Lungs are clear. No pleural effusions. No pneumothorax. Mediastinal contours appear intact. IMPRESSION: Cardiac enlargement.  No evidence of active pulmonary disease. Electronically Signed   By: Burman Nieves M.D.   On: 02/25/2023 23:08    Microbiology: Results for orders placed or performed during the hospital encounter of 02/25/23  Blood Culture (routine x 2)     Status: None (Preliminary result)   Collection Time: 02/25/23 10:41 PM   Specimen: BLOOD  Result Value Ref Range Status   Specimen Description BLOOD BLOOD  RIGHT ARM  Final   Special Requests   Final    BOTTLES DRAWN AEROBIC AND ANAEROBIC Blood Culture results may not be optimal due to an excessive volume of blood received in culture bottles   Culture   Final    NO GROWTH 2 DAYS Performed at [facility], [address] [place], [place] [address]    Report Status PENDING  Incomplete  Blood Culture (routine x 2)     Status:  None (Preliminary result)   Collection Time: 02/25/23 10:46 PM   Specimen: BLOOD  Result Value Ref Range Status   Specimen Description BLOOD RIGHT ANTECUBITAL  Final   Special Requests   Final    BOTTLES DRAWN AEROBIC AND ANAEROBIC Blood Culture adequate volume   Culture   Final    NO GROWTH 2 DAYS Performed at [facility], [address], [place], [place] [address]    Report Status PENDING  Incomplete  Respiratory (~20 pathogens) panel by PCR     Status: None   Collection Time: 02/26/23  3:41 AM   Specimen: Nasopharyngeal Swab; Respiratory  Result Value Ref Range Status   Adenovirus NOT DETECTED NOT DETECTED Final   Coronavirus 229E NOT DETECTED NOT DETECTED Final    Comment: (NOTE) The Coronavirus on the Respiratory Panel, DOES NOT test for the novel  Coronavirus (2019 nCoV)    Coronavirus HKU1 NOT DETECTED NOT DETECTED Final   Coronavirus NL63 NOT DETECTED NOT DETECTED Final   Coronavirus OC43 NOT DETECTED NOT DETECTED Final   Metapneumovirus NOT DETECTED NOT DETECTED Final   Rhinovirus / Enterovirus NOT DETECTED NOT DETECTED Final   Influenza A NOT DETECTED NOT DETECTED Final   Influenza B NOT DETECTED NOT DETECTED Final   Parainfluenza Virus 1 NOT DETECTED NOT DETECTED Final   Parainfluenza Virus 2 NOT DETECTED NOT DETECTED Final   Parainfluenza Virus 3 NOT DETECTED NOT DETECTED Final   Parainfluenza Virus 4 NOT DETECTED NOT DETECTED Final   Respiratory Syncytial Virus NOT DETECTED NOT DETECTED Final   Bordetella pertussis NOT DETECTED NOT DETECTED Final   Bordetella Parapertussis NOT DETECTED NOT DETECTED Final   Chlamydophila pneumoniae NOT DETECTED NOT DETECTED Final   Mycoplasma pneumoniae NOT DETECTED NOT DETECTED Final    Comment: Performed at [facility], [address] [place], [place] [address]  SARS Coronavirus 2 by RT PCR (hospital order, performed in [facility] hospital lab) *cepheid single result test* Anterior Nasal Swab     Status: None    Collection Time: 02/26/23  6:41 AM   Specimen: Anterior Nasal Swab  Result Value Ref Range Status   SARS Coronavirus 2 by RT PCR NEGATIVE NEGATIVE Final    Comment: Performed at [facility], [address], [place], [place] [address]    Labs: CBC: Recent Labs  Lab 02/25/23 2247 02/26/23 0902 02/27/23 0115  WBC 14.6* 10.1 7.9  NEUTROABS 12.6* 8.6*  --   HGB 9.9* 9.9* 9.3*  HCT 29.9* 29.8* 27.6*  MCV 88.7 86.9 87.3  PLT 460* 506* 447*   Basic Metabolic Panel: Recent Labs  Lab 02/25/23 2247 02/26/23 0902 02/27/23 0115  NA 134* 139 136  K 3.9 4.0 3.4*  CL 101 104 106  CO2 22 26 23   GLUCOSE 103* 85 70  BUN 25* 25* 21  CREATININE 1.28* 1.28* 0.95  CALCIUM 8.4* 8.3* 7.9*  MG  --   --  1.7  PHOS  --   --  2.6  Liver Function Tests: Recent Labs  Lab 02/25/23 2247 02/26/23 0902 02/27/23 0115  AST 16 13* 11*  ALT 11 10 10   ALKPHOS 43 47 37*  BILITOT 1.6* 1.8* 1.5*  PROT 4.9* 4.9* 4.2*  ALBUMIN 2.3* 2.2* 1.8*   CBG: No results for input(s): "GLUCAP" in the last 168 hours.  Discharge time spent: greater than 30 minutes.  Signed: Tyrone Nine, MD [facility] 02/28/2023

## 2023-02-28 NOTE — TOC Transition Note (Addendum)
Transition of Care [name]) - CM/SW Discharge Note   Patient Details  Name: Leslie [name] MRN: [id number] Date of Birth: 1927-04-29  Transition of Care [name]) CM/SW Contact:  Leslie Bridgeman, RN Phone Number: 02/28/2023, 1:21 PM   Clinical Narrative:    CM called and spoke with the patient's son and he is aware that patient can be discharged to [facility] by car this afternoon at 4 pm.  Discharge summary was faxed to the facility at [phone] for the ALF to fill the patient's needed medications and provide care when she arrives.  FL2 was sent earlier in the week.  TB skin test will be read at 4 pm today by bedside nursing and documented on a progress note by RN.  I provided Medicare choice to son for home health and DME and he did not have a preference.  I called [facility] and Leslie [name], RNCM accepted for PT/OT.  [employer] was called to deliver a Rolator to hospital room.  Patient will discharge to ALF this afternoon by car.  TB skin test is negative - progress note is completed - all clinicals were emailed to [email or web address].   Final next level of care: Assisted Living Barriers to Discharge: Continued Medical Work up   Patient Goals and CMS Choice CMS Medicare.gov Compare Post Acute Care list provided to:: Patient Represenative (must comment) (son, Leslie [name] at the bedside) Choice offered to / list presented to : Adult Children  Discharge Placement                         Discharge Plan and Services Additional resources added to the After Visit Summary for     Discharge Planning Services: CM Consult Post Acute Care Choice: Resumption of Svcs/PTA Provider (Family needs assistance with ALF)                               Social Determinants of Health (SDOH) Interventions SDOH Screenings   Food Insecurity: No Food Insecurity (02/26/2023)  Housing: Low Risk  (02/26/2023)  Transportation Needs: No Transportation Needs (02/26/2023)  Utilities: Not At Risk  (02/26/2023)  Depression (PHQ2-9): Low Risk  (11/11/2022)  Tobacco Use: Medium Risk (02/26/2023)     Readmission Risk Interventions     No data to display

## 2023-02-28 NOTE — Progress Notes (Signed)
TB testing done on 8/14 on the left forearm. Reading is negative as of 8/16.

## 2023-02-28 NOTE — Progress Notes (Signed)
Called [place] with [facility] to give report and answer any questions for staff about patient.

## 2023-02-28 NOTE — Progress Notes (Signed)
Occupational Therapy Treatment Patient Details Name: Leslie [name] MRN: [id number] DOB: [birth year] Today's Date: 02/28/2023   History of present illness Patient is a 89 yo female presenting to the ED with diarrhea on 02/25/23. Found to be tachycardic and hypotensive. Elevated WBC and fecal impaction noted. PMH includes: permanent atrial fibrillation, labile hypertension, dyslipidemia, prediabetes, history of low back pain secondary to compression fractures, stroke dyslipidemia, and glaucoma.   OT comments  Pt progressing toward established OT goals. Challenging activity tolerance, strength and balance with ADL retraining and longer distance functional mobility into hall. Pt coming to EOB with CGA and mod cues for attention to task due to pt internally distracted talking to therapist. Donning shoes with CGA at EOB bending toward feet. Able to go to restroom and perform pericare as well as functional mobility into hall with CGA as well. OT providing cues for activity pacing as pt fatiguing and no request for rest break. Will continue to follow and continue to recommend HHOT.       If plan is discharge home, recommend the following:  A little help with walking and/or transfers;A little help with bathing/dressing/bathroom;Assist for transportation   Equipment Recommendations  Other (comment) (defer)    Recommendations for Other Services      Precautions / Restrictions Precautions Precautions: Fall Restrictions Weight Bearing Restrictions: No       Mobility Bed Mobility Overal bed mobility: Needs Assistance Bed Mobility: Supine to Sit, Sit to Supine     Supine to sit: Contact guard Sit to supine: Supervision   General bed mobility comments: for safety    Transfers Overall transfer level: Needs assistance Equipment used: None Transfers: Sit to/from Stand Sit to Stand: Contact guard assist           General transfer comment: for safety     Balance Overall balance  assessment: Mild deficits observed, not formally tested                                         ADL either performed or assessed with clinical judgement   ADL Overall ADL's : Needs assistance/impaired Eating/Feeding: Set up Eating/Feeding Details (indicate cue type and reason): Finishing food on arrival. Grooming: Wash/dry hands;Set up;Standing Grooming Details (indicate cue type and reason): at sink. Refused oral care or deodorant             Lower Body Dressing: Contact guard assist;Sit to/from stand;Sitting/lateral leans Lower Body Dressing Details (indicate cue type and reason): to don and doff shoes sitting at EOB Toilet Transfer: Contact guard Nurse, adult Details (indicate cue type and reason): CGA for safety Toileting- Clothing Manipulation and Hygiene: Contact guard assist;Sit to/from stand Toileting - Clothing Manipulation Details (indicate cue type and reason): Pt able to perform pericare without physical assist     Functional mobility during ADLs: Contact guard assist      Extremity/Trunk Assessment Upper Extremity Assessment Upper Extremity Assessment: Generalized weakness   Lower Extremity Assessment Lower Extremity Assessment: Defer to PT evaluation        Vision   Additional Comments: glaucoma. Difficulty reading board from bed.   Perception     Praxis      Cognition Arousal: Alert Behavior During Therapy: WFL for tasks assessed/performed Overall Cognitive Status: No family/caregiver present to determine baseline cognitive functioning  General Comments: hard of hearing. Reporting she thinks it is january, but she does not keep up with it anymore (reoriented to August abd written on board but pt with difficulty reading board due to glaucoma). Pt with poor awareness of activity tolerance, reporting " I can walk 5 miles" but difficulty walking around RN  station once and reporting "it's different because it is not outside" Poor insight        Exercises      Shoulder Instructions       General Comments VSS on RA    Pertinent Vitals/ Pain       Pain Assessment Pain Assessment: Faces Faces Pain Scale: Hurts little more Pain Location: back Pain Descriptors / Indicators: Aching Pain Intervention(s): Limited activity within patient's tolerance, Monitored during session  Home Living                                          Prior Functioning/Environment              Frequency  Min 1X/week        Progress Toward Goals  OT Goals(current goals can now be found in the care plan section)  Progress towards OT goals: Progressing toward goals  Acute Rehab OT Goals Patient Stated Goal: go home OT Goal Formulation: With patient Time For Goal Achievement: 03/12/23 Potential to Achieve Goals: Good ADL Goals Pt Will Perform Lower Body Bathing: Independently;sit to/from stand;sitting/lateral leans Pt Will Perform Lower Body Dressing: Independently;sitting/lateral leans;sit to/from stand Pt Will Transfer to Toilet: Independently;ambulating;regular height toilet Pt Will Perform Toileting - Clothing Manipulation and hygiene: Independently;sitting/lateral leans;sit to/from stand Pt Will Perform Tub/Shower Transfer: Tub transfer;with min assist;tub bench  Plan      Co-evaluation                 AM-PAC OT "6 Clicks" Daily Activity     Outcome Measure   Help from another person eating meals?: None Help from another person taking care of personal grooming?: None Help from another person toileting, which includes using toliet, bedpan, or urinal?: A Little Help from another person bathing (including washing, rinsing, drying)?: A Little Help from another person to put on and taking off regular upper body clothing?: None Help from another person to put on and taking off regular lower body clothing?: A  Little 6 Click Score: 21    End of Session Equipment Utilized During Treatment: Gait belt  OT Visit Diagnosis: Unsteadiness on feet (R26.81);Muscle weakness (generalized) (M62.81);History of falling (Z91.81)   Activity Tolerance Patient tolerated treatment well   Patient Left in bed;with call bell/phone within reach;with bed alarm set   Nurse Communication Mobility status        Time: 1610-9604 OT Time Calculation (min): 19 min  Charges: OT General Charges $OT Visit: 1 Visit OT Treatments $Self Care/Home Management : 8-22 mins  Tyler Deis, OTR/L [facility] Acute Rehabilitation Office: [phone]   Myrla Halsted 02/28/2023, 3:17 PM

## 2023-03-03 LAB — CULTURE, BLOOD (ROUTINE X 2): Special Requests: ADEQUATE

## 2023-03-10 ENCOUNTER — Inpatient Hospital Stay: Payer: Medicare Other | Admitting: Nurse Practitioner

## 2023-03-12 DIAGNOSIS — E785 Hyperlipidemia, unspecified: Secondary | ICD-10-CM | POA: Diagnosis not present

## 2023-03-12 DIAGNOSIS — N189 Chronic kidney disease, unspecified: Secondary | ICD-10-CM | POA: Diagnosis not present

## 2023-03-12 DIAGNOSIS — I10 Essential (primary) hypertension: Secondary | ICD-10-CM | POA: Diagnosis not present

## 2023-03-12 DIAGNOSIS — E559 Vitamin D deficiency, unspecified: Secondary | ICD-10-CM | POA: Diagnosis not present

## 2023-03-12 DIAGNOSIS — E876 Hypokalemia: Secondary | ICD-10-CM | POA: Diagnosis not present

## 2023-03-12 DIAGNOSIS — F03A0 Unspecified dementia, mild, without behavioral disturbance, psychotic disturbance, mood disturbance, and anxiety: Secondary | ICD-10-CM | POA: Diagnosis not present

## 2023-03-12 DIAGNOSIS — R609 Edema, unspecified: Secondary | ICD-10-CM | POA: Diagnosis not present

## 2023-03-13 ENCOUNTER — Telehealth: Payer: Self-pay | Admitting: Nurse Practitioner

## 2023-03-13 NOTE — Telephone Encounter (Signed)
Pt's son spoke with home health and has told them Pt needs therapy, so they will do therapy 1 day a week for 6 weeks.

## 2023-03-13 NOTE — Telephone Encounter (Signed)
Homehealth called to let us know Pt does not/ is okay without therapy.

## 2023-03-19 DIAGNOSIS — N189 Chronic kidney disease, unspecified: Secondary | ICD-10-CM | POA: Diagnosis not present

## 2023-03-19 DIAGNOSIS — R609 Edema, unspecified: Secondary | ICD-10-CM | POA: Diagnosis not present

## 2023-03-19 DIAGNOSIS — F03A0 Unspecified dementia, mild, without behavioral disturbance, psychotic disturbance, mood disturbance, and anxiety: Secondary | ICD-10-CM | POA: Diagnosis not present

## 2023-03-19 DIAGNOSIS — I10 Essential (primary) hypertension: Secondary | ICD-10-CM | POA: Diagnosis not present

## 2023-04-01 ENCOUNTER — Ambulatory Visit: Payer: Medicare Other | Attending: Cardiology | Admitting: Cardiology

## 2023-04-01 ENCOUNTER — Encounter: Payer: Self-pay | Admitting: Cardiology

## 2023-04-01 VITALS — BP 138/80 | Ht 62.0 in | Wt 102.8 lb

## 2023-04-01 DIAGNOSIS — I5042 Chronic combined systolic (congestive) and diastolic (congestive) heart failure: Secondary | ICD-10-CM | POA: Diagnosis present

## 2023-04-01 DIAGNOSIS — Z8673 Personal history of transient ischemic attack (TIA), and cerebral infarction without residual deficits: Secondary | ICD-10-CM

## 2023-04-01 NOTE — Patient Instructions (Signed)
Medication Instructions:  Your physician recommends that you continue on your current medications as directed. Please refer to the Current Medication list given to you today.  *If you need a refill on your cardiac medications before your next appointment, please call your pharmacy*  Lab Work: None ordered today.  Testing/Procedures: None ordered today.  Follow-Up: At [facility], you and your health needs are our priority.  As part of our continuing mission to provide you with exceptional heart care, we have created designated Provider Care Teams.  These Care Teams include your primary Cardiologist (physician) and Advanced Practice Providers (APPs -  Physician Assistants and Nurse Practitioners) who all work together to provide you with the care you need, when you need it.  Your next appointment:   6 month(s)  The format for your next appointment:   In Person  Provider:   Donato Schultz, MD {

## 2023-04-01 NOTE — Progress Notes (Signed)
Cardiology Office Note:  .   Date:  04/01/2023  ID:  Leslie [name], DOB 01-Mar-1927, MRN [id number] PCP: Lucky Cowboy, MD  [facility] Providers Cardiologist:  Donato Schultz, MD    History of Present Illness: .   Leslie [name] is a 89 y.o. female  female here for follow-up of cardiomyopathy EF 25% with hypertension hyperlipidemia.  Small punctate infarct on brain MRI right frontoparietal vertex and left cerebellum back in March 2022.  Had facial droop.  Stroke was felt to be possible embolic source and Eliquis was recommended until EF improved.  Low-dose beta-blocker was added because of asymptomatic nonsustained ventricular tachycardia.  Her son lives in [place].  Her other son lives in [place].  She enjoys walking around her 2 acre property previously.   Was in hospital 02/28/23 - intractable diarrhea.  NSR on ECG then    Studies Reviewed: Marland Kitchen        LABS LDL: 49 (02/18/2023) Hemoglobin: 9.3 Creatinine: 0.95  DIAGNOSTIC Echocardiogram: 25% EF (01/10/2021) Risk Assessment/Calculations:                 Physical Exam:   VS:  BP 138/80   Ht 5\' 2"  (1.575 m)   Wt 102 lb 12.8 oz (46.6 kg)   BMI 18.80 kg/m    Wt Readings from Last 3 Encounters:  04/01/23 102 lb 12.8 oz (46.6 kg)  02/25/23 111 lb (50.3 kg)  02/18/23 91 lb 6.4 oz (41.5 kg)    GEN: Walker,Well nourished, well developed in no acute distress NECK: No JVD; No carotid bruits CARDIAC: RRR, no murmurs, rubs, gallops RESPIRATORY:  Clear to auscultation without rales, wheezing or rhonchi  ABDOMEN: Soft, non-tender, non-distended EXTREMITIES:  No edema; No deformity   ASSESSMENT AND PLAN: .    Assessment and Plan    Heart Failure with Reduced Ejection Fraction (HFrEF) EF 25% on 01/10/2021, NYHA class I symptoms. No recent changes in symptoms or functional status. -Continue current regimen of Metoprolol 25mg  daily. No other GDMT.   Hyperlipidemia LDL 49 on 02/18/2023, well controlled on Atorvastatin 20mg   daily. -Continue Atorvastatin 20mg  daily.  Atrial Fibrillation and Stroke Prevention On Eliquis 2.5mg  BID, no recent bleeding events. Hemoglobin stable at 9.3, slightly lower than previous. Sees Hematology as well.  -Continue Eliquis 2.5mg  BID. -Monitor hemoglobin levels closely with primary care physician.   Renal Function Creatinine 0.95, within normal limits. -Continue current management.  Follow-up No changes to current management plan. Continue monitoring with primary care physician and cardiology.            Dispo: 6 month  Signed, Donato Schultz, MD

## 2023-04-03 DIAGNOSIS — E785 Hyperlipidemia, unspecified: Secondary | ICD-10-CM | POA: Diagnosis not present

## 2023-04-03 DIAGNOSIS — R7612 Nonspecific reaction to cell mediated immunity measurement of gamma interferon antigen response without active tuberculosis: Secondary | ICD-10-CM | POA: Diagnosis not present

## 2023-04-03 DIAGNOSIS — N189 Chronic kidney disease, unspecified: Secondary | ICD-10-CM | POA: Diagnosis not present

## 2023-04-03 DIAGNOSIS — I10 Essential (primary) hypertension: Secondary | ICD-10-CM | POA: Diagnosis not present

## 2023-04-07 ENCOUNTER — Encounter (HOSPITAL_COMMUNITY): Payer: Self-pay

## 2023-04-07 ENCOUNTER — Emergency Department (HOSPITAL_COMMUNITY): Payer: Medicare Other

## 2023-04-07 ENCOUNTER — Emergency Department (HOSPITAL_COMMUNITY)
Admission: EM | Admit: 2023-04-07 | Discharge: 2023-04-08 | Disposition: A | Discharge status: Other Acute Inpatient Hospital | Payer: Medicare Other | Attending: Emergency Medicine | Admitting: Emergency Medicine

## 2023-04-07 ENCOUNTER — Other Ambulatory Visit: Payer: Self-pay

## 2023-04-07 DIAGNOSIS — S32048A Other fracture of fourth lumbar vertebra, initial encounter for closed fracture: Secondary | ICD-10-CM | POA: Insufficient documentation

## 2023-04-07 DIAGNOSIS — F0390 Unspecified dementia without behavioral disturbance: Secondary | ICD-10-CM | POA: Insufficient documentation

## 2023-04-07 DIAGNOSIS — W19XXXA Unspecified fall, initial encounter: Secondary | ICD-10-CM | POA: Diagnosis not present

## 2023-04-07 DIAGNOSIS — Z7901 Long term (current) use of anticoagulants: Secondary | ICD-10-CM | POA: Diagnosis not present

## 2023-04-07 DIAGNOSIS — Y92199 Unspecified place in other specified residential institution as the place of occurrence of the external cause: Secondary | ICD-10-CM | POA: Diagnosis not present

## 2023-04-07 DIAGNOSIS — M549 Dorsalgia, unspecified: Secondary | ICD-10-CM | POA: Diagnosis present

## 2023-04-07 NOTE — ED Provider Triage Note (Cosign Needed Addendum)
Emergency Medicine Provider Triage Evaluation Note  Leslie [name] , a 89 y.o. female  was evaluated in triage.  Pt was sent from her living facility due to a fall on Friday.  She had an x-ray that read 3 possible vertebral fractures.  Patient denies any complaints or concerns.  She states that she fell and landed on her knees.  Denies hitting her head or loss of consciousness.  She is on Eliquis.  Review of Systems  Positive: Fall Negative: Back pain, head injury, loss of consciousness, knee pain  Physical Exam  BP (!) 144/87 (BP Location: Right Arm)   Pulse 71   Temp 98.2 F (36.8 C) (Oral)   Resp 15   Ht 5\' 2"  (1.575 m)   Wt 46.3 kg   SpO2 99%   BMI 18.66 kg/m  Gen:   Awake, no distress   Resp:  Normal effort  MSK:   Moves extremities without difficulty  Other:  No midline tenderness to palpation of the spine.  Medical Decision Making  Medically screening exam initiated at 2:45 PM.  Appropriate orders placed.  Leslie [name] was informed that the remainder of the evaluation will be completed by another provider, this initial triage assessment does not replace that evaluation, and the importance of remaining in the ED until their evaluation is complete.   Maxwell Marion, PA-C 04/07/23 1446    Maxwell Marion, PA-C 04/07/23 1447

## 2023-04-07 NOTE — ED Triage Notes (Signed)
Coming from [facility] after fall on Friday xray read possible 3 vertabrae fxs.  Patient denies hitting head but staff noticed trying to get her out of bed this morning she was having severe back pain.  Patient is on eliquis but no head injury.

## 2023-04-07 NOTE — ED Provider Notes (Signed)
[place] EMERGENCY DEPARTMENT AT [facility] Provider Note   CSN: [id number] Arrival date & time: 04/07/23  1341     History Chief Complaint  Patient presents with   Fall    HPI SANYE PINT is a 89 y.o. female presenting for fall at her facility.  Patient is 69 has underlying cognitive impairment cannot remember why she is here.  She said that she called her son Duane. She denies any pain nausea vomiting syncope shortness of breath.. Per EMS report, patient is coming from [facility] secondary to a outpatient x-ray that showed 3 vertebral fractures.  She has been endorsing back pain at the facility but patient does not recall this.  Patient's recorded medical, surgical, social, medication list and allergies were reviewed in the Snapshot window as part of the initial history.   Review of Systems   Review of Systems  Unable to perform ROS: Dementia    Physical Exam Updated Vital Signs BP (!) 144/87 (BP Location: Right Arm)   Pulse 71   Temp 98.2 F (36.8 C) (Oral)   Resp 15   Ht 5\' 2"  (1.575 m)   Wt 46.3 kg   SpO2 99%   BMI 18.66 kg/m  Physical Exam Vitals and nursing note reviewed.  Constitutional:      General: She is not in acute distress.    Appearance: She is well-developed.  HENT:     Head: Normocephalic and atraumatic.  Eyes:     Conjunctiva/sclera: Conjunctivae normal.  Cardiovascular:     Rate and Rhythm: Normal rate and regular rhythm.     Heart sounds: No murmur heard. Pulmonary:     Effort: Pulmonary effort is normal. No respiratory distress.     Breath sounds: Normal breath sounds.  Abdominal:     Palpations: Abdomen is soft.     Tenderness: There is no abdominal tenderness.  Musculoskeletal:        General: No swelling.     Cervical back: Neck supple.  Skin:    General: Skin is warm and dry.     Capillary Refill: Capillary refill takes less than 2 seconds.  Neurological:     Mental Status: She is alert.  Psychiatric:         Mood and Affect: Mood normal.      ED Course/ Medical Decision Making/ A&P    Procedures Procedures   Medications Ordered in ED Medications - No data to display  Medical Decision Making:    JIMENA VASSEUR is a 89 y.o. female who presented to the ED today with abnormal x-rays detailed above.     Complete initial physical exam performed, notably the patient  was hemodynamically stable no acute distress.    Reviewed outside records including paper chart of x-rays with concern for wedge deformities. Reviewed and confirmed nursing documentation for past medical history, family history, social history.    Initial Assessment:   Will further evaluate patient's spinal fractures with CT imaging. Resulted with multiple findings including a T8 compression fracture complete, 1280% vertebral body loss with some acute on chronic component, T5 with 35% loss also chronic, Acute nondisplaced fracture of the right transverse process of L4 and a pulm nod.  Fortunately her exam is benign.  No head injury on exam.  She is on Eliquis but otherwise seems to be at her baseline.  Had a conference call with all the patient's caregivers.  They feel comfortable with supportive care for her condition. I have consulted  neurosurgery for recommendations given the multifocal subacute fractures.  They stated the patient could either have an LSO brace or supportive care depending on what patient's preferences.  She is not very mobile at baseline and I am worried about pressure ulcer formation given her requirement of care at an assisted living facility.  Family is in agreement that her pain just seems to be temporary and mild and they feel comfortable with just supportive care at this time. Strict return precautions running interval worsening development of neurologic symptoms reinforced patient's breast understanding.  Disposition:  I have considered need for hospitalization, however, considering all of the above, I  believe this patient is stable for discharge at this time.  Patient/family educated about specific return precautions for given chief complaint and symptoms.  Patient/family educated about follow-up with PCP.     Patient/family expressed understanding of return precautions and need for follow-up. Patient spoken to regarding all imaging and laboratory results and appropriate follow up for these results. All education provided in verbal form with additional information in written form. Time was allowed for answering of patient questions. Patient discharged.    Emergency Department Medication Summary:   Medications - No data to display   Clinical Impression:  1. Fall, initial encounter      Discharge   Final Clinical Impression(s) / ED Diagnoses Final diagnoses:  Fall, initial encounter    Rx / DC Orders ED Discharge Orders     None         Glyn Ade, MD 04/07/23 1740

## 2023-04-07 NOTE — Discharge Instructions (Addendum)
8 mm right upper lobe pulmonary nodule. Non-contrast chest CT at  6-12 months is recommended. If the nodule is stable at time of  repeat CT, then future CT at 18-24 months (from today's scan) is  considered optional for low-risk patients, but is recommended for  high-risk patients. This recommendation follows the consensus  statement: Guidelines for Management of Incidental Pulmonary Nodules  Detected on CT Images: From the Fleischner Society 2017; Radiology  2017; 284:228-243.

## 2023-04-23 ENCOUNTER — Encounter: Payer: Medicare Other | Admitting: Internal Medicine

## 2023-05-30 ENCOUNTER — Other Ambulatory Visit: Payer: Self-pay | Admitting: Internal Medicine

## 2023-05-30 DIAGNOSIS — I639 Cerebral infarction, unspecified: Secondary | ICD-10-CM

## 2023-05-30 DIAGNOSIS — R0989 Other specified symptoms and signs involving the circulatory and respiratory systems: Secondary | ICD-10-CM

## 2023-06-05 ENCOUNTER — Inpatient Hospital Stay: Payer: Medicare Other | Attending: Hematology and Oncology | Admitting: Hematology and Oncology

## 2023-06-05 ENCOUNTER — Encounter: Payer: Self-pay | Admitting: *Deleted

## 2023-06-05 ENCOUNTER — Inpatient Hospital Stay: Payer: Medicare Other

## 2023-06-05 VITALS — BP 123/70 | HR 78 | Temp 97.9°F | Resp 18 | Ht 62.0 in | Wt 98.9 lb

## 2023-06-05 DIAGNOSIS — D473 Essential (hemorrhagic) thrombocythemia: Secondary | ICD-10-CM | POA: Insufficient documentation

## 2023-06-05 DIAGNOSIS — Z79899 Other long term (current) drug therapy: Secondary | ICD-10-CM | POA: Insufficient documentation

## 2023-06-05 DIAGNOSIS — D75839 Thrombocytosis, unspecified: Secondary | ICD-10-CM | POA: Insufficient documentation

## 2023-06-05 DIAGNOSIS — Z7901 Long term (current) use of anticoagulants: Secondary | ICD-10-CM | POA: Insufficient documentation

## 2023-06-05 LAB — CBC WITH DIFFERENTIAL (CANCER CENTER ONLY)
Abs Immature Granulocytes: 0.03 10*3/uL (ref 0.00–0.07)
Basophils Absolute: 0.1 10*3/uL (ref 0.0–0.1)
Basophils Relative: 1 %
Eosinophils Absolute: 0.3 10*3/uL (ref 0.0–0.5)
Eosinophils Relative: 3 %
HCT: 35.7 % — ABNORMAL LOW (ref 36.0–46.0)
Hemoglobin: 11.6 g/dL — ABNORMAL LOW (ref 12.0–15.0)
Immature Granulocytes: 0 %
Lymphocytes Relative: 10 %
Lymphs Abs: 0.9 10*3/uL (ref 0.7–4.0)
MCH: 29.6 pg (ref 26.0–34.0)
MCHC: 32.5 g/dL (ref 30.0–36.0)
MCV: 91.1 fL (ref 80.0–100.0)
Monocytes Absolute: 0.7 10*3/uL (ref 0.1–1.0)
Monocytes Relative: 8 %
Neutro Abs: 7.3 10*3/uL (ref 1.7–7.7)
Neutrophils Relative %: 78 %
Platelet Count: 1075 10*3/uL (ref 150–400)
RBC: 3.92 MIL/uL (ref 3.87–5.11)
RDW: 16 % — ABNORMAL HIGH (ref 11.5–15.5)
WBC Count: 9.3 10*3/uL (ref 4.0–10.5)
nRBC: 0 % (ref 0.0–0.2)

## 2023-06-05 MED ORDER — HYDROXYUREA 500 MG PO CAPS: 500.0000 mg | ORAL_CAPSULE | Freq: Every day | ORAL | 3 refills | Status: DC

## 2023-06-05 NOTE — Progress Notes (Signed)
Patient Care Team: Lucky Cowboy, MD as PCP - General (Internal Medicine) Jake Bathe, MD as PCP - Cardiology (Cardiology) Charlett Nose, [facility] (Inactive) as Pharmacist (Pharmacist) Sundra Aland, [facility] (Inactive) as Pharmacist (Pharmacist)  DIAGNOSIS:  Encounter Diagnosis  Name Primary?   Thrombocytosis Yes   CHIEF COMPLIANT: Follow-up of essential thrombocytosis  HISTORY OF PRESENT ILLNESS:  History of Present Illness   The patient, with a history of elevated platelet counts, presents for a follow-up visit. Over the past four months, her platelet count has doubled from four hundred and forty-seven thousand to one million. The patient has been asymptomatic during this period. The patient's other blood counts, including white blood cell count and hemoglobin, are within normal limits. The patient's hemoglobin has improved from previous levels in the nines to near-normal at eleven point six.         ALLERGIES:  is allergic to advil [ibuprofen], atenolol, and adhesive [tape].  MEDICATIONS:  Current Outpatient Medications  Medication Sig Dispense Refill   hydroxyurea (HYDREA) 500 MG capsule Take 1 capsule (500 mg total) by mouth daily. May take with food to minimize GI side effects. 30 capsule 3   atorvastatin (LIPITOR) 20 MG tablet TAKE 1 TABLET DAILY FOR CHOLESTEROL 90 tablet 3   Cholecalciferol (VITAMIN D3) 25 MCG (1000 UT) CAPS Take 3 capsules  (3,000 units) Daily (Patient taking differently: Take 1,000 Units by mouth daily.) 1 capsule 0   ELIQUIS 2.5 MG TABS tablet TAKE 1 TABLET TWICE A DAY 180 tablet 3   latanoprost (XALATAN) 0.005 % ophthalmic solution Place 1 drop into both eyes at bedtime.     metoprolol succinate (TOPROL-XL) 25 MG 24 hr tablet TAKE 1 TABLET DAILY FOR HEART AND BLOOD PRESSURE 90 tablet 3   QUEtiapine (SEROQUEL XR) 50 MG TB24 24 hr tablet Take 1 tablet Daily at Lunchtime for Memory & Concentration (Patient taking differently: Take 50 mg by mouth daily.   Memory & Concentration) 90 tablet 3   No current facility-administered medications for this visit.    PHYSICAL EXAMINATION: ECOG PERFORMANCE STATUS: 1 - Symptomatic but completely ambulatory  Vitals:   06/05/23 0934  BP: 123/70  Pulse: 78  Resp: 18  Temp: 97.9 F (36.6 C)   Filed Weights   06/05/23 0934  Weight: 98 lb 14.4 oz (44.9 kg)      LABORATORY DATA:  I have reviewed the data as listed    Latest Ref Rng & Units 02/27/2023    1:15 AM 02/26/2023    9:02 AM 02/25/2023   10:47 PM  CMP  Glucose 70 - 99 mg/dL 70  85  657   BUN 8 - 23 mg/dL 21  25  25    Creatinine 0.44 - 1.00 mg/dL 8.46  9.62  9.52   Sodium 135 - 145 mmol/L 136  139  134   Potassium 3.5 - 5.1 mmol/L 3.4  4.0  3.9   Chloride 98 - 111 mmol/L 106  104  101   CO2 22 - 32 mmol/L 23  26  22    Calcium 8.9 - 10.3 mg/dL 7.9  8.3  8.4   Total Protein 6.5 - 8.1 g/dL 4.2  4.9  4.9   Total Bilirubin 0.3 - 1.2 mg/dL 1.5  1.8  1.6   Alkaline Phos 38 - 126 U/L 37  47  43   AST 15 - 41 U/L 11  13  16    ALT 0 - 44 U/L 10  10  11  Lab Results  Component Value Date   WBC 9.3 06/05/2023   HGB 11.6 (L) 06/05/2023   HCT 35.7 (L) 06/05/2023   MCV 91.1 06/05/2023   PLT 1,075 (HH) 06/05/2023   NEUTROABS 7.3 06/05/2023    ASSESSMENT & PLAN:  Thrombocytosis Lab review: 10/09/2021: Platelets 496 04/18/2022: Platelets 572 07/29/2022: Platelets 706 08/20/22: Platelets 605, Iron studies: Sat: 20%, Ferritin 36, Cr: 1.18, MPN Panel: CALR mutation 11/12/2022: Platelets 781 12/03/2022: Platelets 784 06/05/2023: Platelets 1075   Recommendation: Initiate therapy with Hydrea 500 mg daily Recheck labs in 2 weeks     Orders Placed This Encounter  Procedures   CBC with Differential (Cancer Center Only)    Standing Status:   Future    Standing Expiration Date:   06/04/2024   The patient has a good understanding of the overall plan. she agrees with it. she will call with any problems that may develop before the next visit  here. Total time spent: 30 mins including face to face time and time spent for planning, charting and co-ordination of care   Tamsen Meek, MD 06/05/23

## 2023-06-05 NOTE — Assessment & Plan Note (Addendum)
Lab review: 10/09/2021: Platelets 496 04/18/2022: Platelets 572 07/29/2022: Platelets 706 08/20/22: Platelets 605, Iron studies: Sat: 20%, Ferritin 36, Cr: 1.18, MPN Panel: CALR mutation 11/12/2022: Platelets 781 12/03/2022: Platelets 784 06/05/2023: Platelets 1075   Recommendation: Initiate therapy with Hydrea 500 mg daily Recheck labs in 2 weeks

## 2023-06-05 NOTE — Progress Notes (Signed)
CRITICAL VALUE STICKER  CRITICAL VALUE: Plt 1,075  RECEIVER (on-site recipient of call): Adelina Mings, RN  DATE & TIME NOTIFIED: 06/05/23 at 0935  MD NOTIFIED: Serena Croissant, MD  TIME OF NOTIFICATION: 06/05/23 at 0102  RESPONSE:  MD verbalized understanding.

## 2023-06-16 NOTE — Assessment & Plan Note (Signed)
Lab review: 10/09/2021: Platelets 496 04/18/2022: Platelets 572 07/29/2022: Platelets 706 08/20/22: Platelets 605, Iron studies: Sat: 20%, Ferritin 36, Cr: 1.18, MPN Panel: CALR mutation 11/12/2022: Platelets 781 12/03/2022: Platelets 784 06/05/2023: Platelets 1075 06/17/2023: Platelets   Recommendation: Initiate therapy with Hydrea 500 mg daily Recheck labs in 2 weeks

## 2023-06-17 ENCOUNTER — Inpatient Hospital Stay: Payer: Medicare Other | Attending: Internal Medicine

## 2023-06-17 ENCOUNTER — Inpatient Hospital Stay (HOSPITAL_BASED_OUTPATIENT_CLINIC_OR_DEPARTMENT_OTHER): Payer: Medicare Other | Admitting: Hematology and Oncology

## 2023-06-17 VITALS — BP 137/85 | HR 85 | Temp 98.4°F | Resp 18 | Ht 62.0 in | Wt 97.4 lb

## 2023-06-17 DIAGNOSIS — D75839 Thrombocytosis, unspecified: Secondary | ICD-10-CM | POA: Insufficient documentation

## 2023-06-17 DIAGNOSIS — D473 Essential (hemorrhagic) thrombocythemia: Secondary | ICD-10-CM | POA: Insufficient documentation

## 2023-06-17 LAB — CBC WITH DIFFERENTIAL (CANCER CENTER ONLY)
Abs Immature Granulocytes: 0.04 10*3/uL (ref 0.00–0.07)
Basophils Absolute: 0.1 10*3/uL (ref 0.0–0.1)
Basophils Relative: 1 %
Eosinophils Absolute: 0.2 10*3/uL (ref 0.0–0.5)
Eosinophils Relative: 2 %
HCT: 33.8 % — ABNORMAL LOW (ref 36.0–46.0)
Hemoglobin: 11.2 g/dL — ABNORMAL LOW (ref 12.0–15.0)
Immature Granulocytes: 0 %
Lymphocytes Relative: 12 %
Lymphs Abs: 1.2 10*3/uL (ref 0.7–4.0)
MCH: 30 pg (ref 26.0–34.0)
MCHC: 33.1 g/dL (ref 30.0–36.0)
MCV: 90.6 fL (ref 80.0–100.0)
Monocytes Absolute: 0.7 10*3/uL (ref 0.1–1.0)
Monocytes Relative: 7 %
Neutro Abs: 7.4 10*3/uL (ref 1.7–7.7)
Neutrophils Relative %: 78 %
Platelet Count: 854 10*3/uL — ABNORMAL HIGH (ref 150–400)
RBC: 3.73 MIL/uL — ABNORMAL LOW (ref 3.87–5.11)
RDW: 16.5 % — ABNORMAL HIGH (ref 11.5–15.5)
WBC Count: 9.6 10*3/uL (ref 4.0–10.5)
nRBC: 0 % (ref 0.0–0.2)

## 2023-06-17 NOTE — Progress Notes (Signed)
Patient Care Team: Lucky Cowboy, MD as PCP - General (Internal Medicine) Jake Bathe, MD as PCP - Cardiology (Cardiology) Charlett Nose, [facility] (Inactive) as Pharmacist (Pharmacist) Sundra Aland, [facility] (Inactive) as Pharmacist (Pharmacist)  DIAGNOSIS:  Encounter Diagnosis  Name Primary?   Thrombocytosis Yes      CHIEF COMPLIANT: Follow-up of essential thrombocytosis on hydroxyurea  HISTORY OF PRESENT ILLNESS:   History of Present Illness   The patient, with a history of elevated platelet count managed with Hydrea, presents for a follow-up visit. She reports no side effects from the medication, including nausea or diarrhea. The patient's caregiver confirms this. The patient's platelet count has decreased from 1075 to 854 since the last visit, indicating a positive response to the medication.         ALLERGIES:  is allergic to advil [ibuprofen], atenolol, and adhesive [tape].  MEDICATIONS:  Current Outpatient Medications  Medication Sig Dispense Refill   atorvastatin (LIPITOR) 20 MG tablet TAKE 1 TABLET DAILY FOR CHOLESTEROL 90 tablet 3   Cholecalciferol (VITAMIN D3) 25 MCG (1000 UT) CAPS Take 3 capsules  (3,000 units) Daily (Patient taking differently: Take 1,000 Units by mouth daily.) 1 capsule 0   ELIQUIS 2.5 MG TABS tablet TAKE 1 TABLET TWICE A DAY 180 tablet 3   hydroxyurea (HYDREA) 500 MG capsule Take 1 capsule (500 mg total) by mouth daily. May take with food to minimize GI side effects. 30 capsule 3   latanoprost (XALATAN) 0.005 % ophthalmic solution Place 1 drop into both eyes at bedtime.     metoprolol succinate (TOPROL-XL) 25 MG 24 hr tablet TAKE 1 TABLET DAILY FOR HEART AND BLOOD PRESSURE 90 tablet 3   QUEtiapine (SEROQUEL XR) 50 MG TB24 24 hr tablet Take 1 tablet Daily at Lunchtime for Memory & Concentration (Patient taking differently: Take 50 mg by mouth daily.  Memory & Concentration) 90 tablet 3   No current facility-administered medications for this  visit.    PHYSICAL EXAMINATION: ECOG PERFORMANCE STATUS: 1 - Symptomatic but completely ambulatory  Vitals:   06/17/23 1312  BP: 137/85  Pulse: 85  Resp: 18  Temp: 98.4 F (36.9 C)  SpO2: 100%   Filed Weights   06/17/23 1312  Weight: 97 lb 6.4 oz (44.2 kg)      LABORATORY DATA:  I have reviewed the data as listed    Latest Ref Rng & Units 02/27/2023    1:15 AM 02/26/2023    9:02 AM 02/25/2023   10:47 PM  CMP  Glucose 70 - 99 mg/dL 70  85  147   BUN 8 - 23 mg/dL 21  25  25    Creatinine 0.44 - 1.00 mg/dL 8.29  5.62  1.30   Sodium 135 - 145 mmol/L 136  139  134   Potassium 3.5 - 5.1 mmol/L 3.4  4.0  3.9   Chloride 98 - 111 mmol/L 106  104  101   CO2 22 - 32 mmol/L 23  26  22    Calcium 8.9 - 10.3 mg/dL 7.9  8.3  8.4   Total Protein 6.5 - 8.1 g/dL 4.2  4.9  4.9   Total Bilirubin 0.3 - 1.2 mg/dL 1.5  1.8  1.6   Alkaline Phos 38 - 126 U/L 37  47  43   AST 15 - 41 U/L 11  13  16    ALT 0 - 44 U/L 10  10  11      Lab Results  Component Value Date  WBC 9.6 06/17/2023   HGB 11.2 (L) 06/17/2023   HCT 33.8 (L) 06/17/2023   MCV 90.6 06/17/2023   PLT 854 (H) 06/17/2023   NEUTROABS 7.4 06/17/2023    ASSESSMENT & PLAN:  Thrombocytosis Lab review: 10/09/2021: Platelets 496 04/18/2022: Platelets 572 07/29/2022: Platelets 706 08/20/22: Platelets 605, Iron studies: Sat: 20%, Ferritin 36, Cr: 1.18, MPN Panel: CALR mutation 11/12/2022: Platelets 781 12/03/2022: Platelets 784 06/05/2023: Platelets 1075 06/17/2023: Platelets 854   Recommendation: Currently on Hydrea 500 mg daily Patient had a wonderful Thanksgiving feast with her son. Recheck labs in 1 month and follow-up    Orders Placed This Encounter  Procedures   CBC with Differential (Cancer Center Only)    Standing Status:   Future    Standing Expiration Date:   06/16/2024   The patient has a good understanding of the overall plan. she agrees with it. she will call with any problems that may develop before the next visit  here. Total time spent: 30 mins including face to face time and time spent for planning, charting and co-ordination of care   Tamsen Meek, MD 06/17/23

## 2023-07-02 ENCOUNTER — Telehealth: Payer: Self-pay | Admitting: Nurse Practitioner

## 2023-07-02 NOTE — Telephone Encounter (Signed)
Nurse from [facility] called to tell us, patient was discharged today with goals met.

## 2023-07-14 ENCOUNTER — Encounter: Payer: Self-pay | Admitting: Internal Medicine

## 2023-07-14 NOTE — Progress Notes (Signed)
 [facility]      ADULT   &   ADOLESCENT      INTERNAL MEDICINE  Leslie Leslie, M.D.          Leslie Leslie, ANP        Leslie Necessary, FNP  Leslie Leslie [address]  [place], [place]. [address] Telephone [phone] Telefax [phone]   Comprehensive Evaluation &  Examination  Future Appointments  Date Time Provider Department  07/15/2023               cpe 11:00 AM Leslie Elsie, MD [facility]  07/18/2023              wellness  1:40 PM Leslie Leslie Pickle, NP [facility]  07/30/2023 11:00 AM Leslie Leslie BRAVO, NP [facility]  10/16/2023  9:00 AM Leslie Leslie BROCKS, MD [facility]  07/28/2024 11:00 AM Leslie Elsie, MD [facility]    Brought today by her son       This very nice 89 y.o. WWF  presents for a comprehensive evaluation and management of multiple medical co-morbidities.  Patient has been followed for HTN, HLD, Prediabetes  and Vitamin D  Deficiency. CT scan in Mar 2022 showed Aortic Atherosclerosis . Patient is also followed by Dr Leslie Leslie  & Leslie Crawford, NP  on Hydrea  for Thrombocytosis. 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        HTN predates since  70. Patient's BP has been controlled at home . 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  Patient is in Eliquis  for suspected pAfib /emboli causing her CVA.  Patient also follows with Dr Leslie for Rt/Lt CHF /EF 20-25%. Patient denies any cardiac symptoms as chest pain, palpitations, shortness of breath, dizziness or ankle swelling. Today's BP is at goal - 136/80 .       Patient's hyperlipidemia is controlled with diet and Atorvastatin  . Patient denies myalgias or other medication SE's. Last lipids were at goal:  Lab Results  Component Value Date   CHOL 108 02/18/2023   HDL 41 (L) 02/18/2023   LDLCALC 49 02/18/2023   TRIG 95 02/18/2023   CHOLHDL  2.6 02/18/2023         Patient has hx/o prediabetes (A1c 5.7% /2012) and patient denies reactive hypoglycemic symptoms, visual blurring, diabetic polys or paresthesias. Last A1c was  at goal:  Lab Results  Component Value Date   HGBA1C 5.6 11/12/2022         Finally, patient has history of Vitamin D  Deficiency and last Vitamin D  was very low:  Lab Results  Component Value Date   VD25OH 31 11/12/2022       Current Outpatient Medications  Medication Instructions   atorvastatin  (LIPITOR) 20 MG tablet TAKE 1 TABLET DAILY    VITAMIN D  1,000 u Take 3 capsules  (3,000 units) Daily   Eliquis  2.5 mg,  Oral, 2 times daily   hydroxyurea  (HYDREA )   500 mg Daily   XALATAN   ophth soln 1 drop, Both Eyes  at bedtime   metoprolol  succinate-XL 25 MG TAKE 1 TABLET DAILY    QUEtiapine  XR 50 MG  Take 1 tablet Daily at Lunchtime      Allergies  Allergen Reactions   Adhesive [Tape]    Advil [Ibuprofen]    Atenolol      Past Medical History:  Diagnosis Date   Anxiety disorder  Cardiomyopathy (HCC)    Glaucoma    Hearing loss    HTN (hypertension)    Hyperglycemia    Hyperlipidemia    LBBB (left bundle branch block)    Stroke (cerebrum) (HCC)    Vitamin D  deficiency      Health Maintenance  Topic Date Due   Zoster Vaccines- Shingrix (1 of 2) Never done   COVID-19 Vaccine (3 - Pfizer risk series) 11/10/2019   HPV VACCINES  Aged Out   INFLUENZA VACCINE  Discontinued   DEXA SCAN  Discontinued   TETANUS/TDAP  Discontinued     Immunization History  Administered Date(s) Administered   DTaP 05/06/2003   PFIZER   SARS-COV-2 Vacc 09/22/2019, 10/13/2019   Pneumococcal-23 05/06/2003   Zoster, Live 07/16/2007    Last Colon - refuses    Last MGM - refuses   Past Surgical History:  Procedure Laterality Date   GLAUCOMA SURGERY  2013   TONSILLECTOMY       Family History  Problem Relation Age of Onset   Heart attack Brother    Heart disease Brother    Heart attack  Brother    Heart disease Brother    Hypertension Mother    Stroke Mother    Heart attack Father    Diabetes Father      Social History   Tobacco Use   Smoking status: Former    Types: Cigarettes    Quit date: 07/16/1979    Years since quitting: 41.7   Smokeless tobacco: Never  Vaping Use   Vaping Use: Never used  Substance Use Topics   Alcohol use: No    Alcohol/week: 0.0 standard drinks   Drug use: No      ROS Constitutional: Denies fever, chills, weight loss/gain, headaches, insomnia,  night sweats, and change in appetite. Does c/o fatigue. Eyes: Denies redness, blurred vision, diplopia, discharge, itchy, watery eyes.  ENT: Denies discharge, congestion, post nasal drip, epistaxis, sore throat, earache, hearing loss, dental pain, Tinnitus, Vertigo, Sinus pain, snoring.  Cardio: Denies chest pain, palpitations, irregular heartbeat, syncope, dyspnea, diaphoresis, orthopnea, PND, claudication, edema Respiratory: denies cough, dyspnea, DOE, pleurisy, hoarseness, laryngitis, wheezing.  Gastrointestinal: Denies dysphagia, heartburn, reflux, water brash, pain, cramps, nausea, vomiting, bloating, diarrhea, constipation, hematemesis, melena, hematochezia, jaundice, hemorrhoids Genitourinary: Denies dysuria, frequency, urgency, nocturia, hesitancy, discharge, hematuria, flank pain Breast: Breast lumps, nipple discharge, bleeding.  Musculoskeletal: Denies arthralgia, myalgia, stiffness, Jt. Swelling, pain, limp, and strain/sprain. Denies falls. Skin: Denies puritis, rash, hives, warts, acne, eczema, changing in skin lesion Neuro: No weakness, tremor, incoordination, spasms, paresthesia, pain Psychiatric: Denies confusion, memory loss, sensory loss. Denies Depression. Endocrine: Denies change in weight, skin, hair change, nocturia, and paresthesia, diabetic polys, visual blurring, hyper / hypo glycemic episodes.  Heme/Lymph: No excessive bleeding, bruising, enlarged lymph  nodes.  Physical Exam  BP 136/80   Pulse 75   Temp 97.9 F (36.6 C)   Resp 16   Ht 5' 2 (1.575 m)   Wt 101 lb 9.6 oz (46.1 kg)   SpO2 98%   BMI 18.58 kg/m   General Appearance: generalized sarcopenia  in no apparent distress.  Eyes: PERRLA, EOMs, conjunctiva no swelling or erythema, normal fundi and vessels. Sinuses: No frontal/maxillary tenderness ENT/Mouth: EACs patent / TMs  nl. Nares clear without erythema, swelling, mucoid exudates. Oral hygiene is good. No erythema, swelling, or exudate. Tongue normal, non-obstructing. Tonsils not swollen or erythematous. Hearing normal.  Neck: Supple, thyroid  not palpable. No bruits, nodes or JVD. Respiratory: Respiratory  effort normal.  BS equal and clear bilateral without rales, rhonci, wheezing or stridor. Cardio: Heart sounds are normal with regular rate and rhythm with an aortic systolic  murmur. Peripheral pulses are 1+ and equal bilaterally without edema. No aortic or femoral bruits. Chest: symmetric with normal excursions and percussion. Breasts: Atrophic w/o palpable abnormal masses.  Abdomen: Flat, soft with bowel sounds active. Nontender, no guarding, rebound, hernias, masses, or organomegaly.  Lymphatics: Non tender without lymphadenopathy.  Musculoskeletal: Full ROM all peripheral extremitie\s with generalized  sarcopenia / decrease in muscle power, tone & bulk.  Normal gait. Skin: Warm and dry without rashes, lesions, cyanosis, clubbing or  ecchymosis.  Neuro: Cranial nerves intact, reflexes equal bilaterally. Normal muscle tone, no cerebellar symptoms. Sensation intact. (+) Snout . (+) palmo-mental reflex. Pysch: Alert and oriented x 3, normal affect, Insight and Judgment limited.    Assessment and Plan   1. Labile hypertension  - EKG 12-Lead - Urinalysis, Routine w reflex microscopic - Microalbumin / creatinine urine ratio - CBC with Differential/Platelet - COMPLETE METABOLIC PANEL WITH GFR - Magnesium - TSH  2.  Hyperlipidemia, mixed  - EKG 12-Lead - Lipid panel - TSH  3. Abnormal glucose  - EKG 12-Lead - Hemoglobin A1c - Insulin , random  4. Vitamin D  deficiency  - VITAMIN D  25 Hydroxy   5. Chronic combined systolic and diastolic CHF (congestive heart failure) (HCC)  - EKG 12-Lead  6. Aortic atherosclerosis (HCC) - CT 09/2020  - EKG 12-Lead - Lipid panel  7. Stage 3a chronic kidney disease (HCC)  - PTH, intact and calcium   8. Screening for colorectal cancer  - POC Hemoccult Bld/Stl   9. Screening for heart disease  - EKG 12-Lead  10. FHx: heart disease  - EKG 12-Lead  11. Former smoker  - EKG 12-Lead  12. Medication management  - Urinalysis, Routine w reflex microscopic - Microalbumin / creatinine urine ratio - CBC with Differential/Platelet - COMPLETE METABOLIC PANEL WITH GFR - Magnesium - Lipid panel - TSH - Hemoglobin A1c - Insulin , random - VITAMIN D  25 Hydroxy           Patient was counseled in prudent diet to achieve/maintain BMI less than 25 for weight control, BP monitoring, regular exercise and medications. Discussed med's effects and SE's. Screening labs and tests as requested with regular follow-up as recommended.  Strongly encouraged to get MGM. Over 40 minutes of exam, counseling, chart review and high complex critical decision making was performed.   Leslie JONETTA Richards, MD

## 2023-07-14 NOTE — Patient Instructions (Addendum)
Due to recent changes in healthcare laws, you may see the results of your imaging and laboratory studies on MyChart before your provider has had a chance to review them.  We understand that in some cases there may be results that are confusing or concerning to you. Not all laboratory results come back in the same time frame and the provider may be waiting for multiple results in order to interpret others.  Please give [place] 48 hours in order for your provider to thoroughly review all the results before contacting the office for clarification of your results.   +++++++++++++++++++++++++  Vit D  & Vit C 1,000 mg   are recommended to help protect  against the Covid-19 and other Corona viruses.    Also it's recommended  to take  Zinc 50 mg x 1/2 tablet = 25 mg  /day  to help  protect against the Covid-19   and best place to get  is also on [email or web address]  and don't pay more than 6-8 cents /pill !  ================================ Coronavirus (COVID-19) Are you at risk?  Are you at risk for the Coronavirus (COVID-19)?  To be considered HIGH RISK for Coronavirus (COVID-19), you have to meet the following criteria:  Traveled to [place], [place], [place], [place] or [place]; or in the [place] to [place], [place], [place]  or [place]; and have fever, cough, and shortness of breath within the last 2 weeks of travel OR Been in close contact with a person diagnosed with COVID-19 within the last 2 weeks and have  fever, cough,and shortness of breath  IF YOU DO NOT MEET THESE CRITERIA, YOU ARE CONSIDERED LOW RISK FOR COVID-19.  What to do if you are HIGH RISK for COVID-19?  If you are having a medical emergency, call 911. Seek medical care right away. Before you go to a doctor's office, urgent care or emergency department,  call ahead and tell them about your recent travel, contact with someone diagnosed with COVID-19   and your symptoms.  You should receive instructions from your  physician's office regarding next steps of care.  When you arrive at healthcare provider, tell the healthcare staff immediately you have returned from  visiting [place], [place], [place], [place] or [place]; or traveled in the [place] to [place], [place],  [place] or [place] in the last two weeks or you have been in close contact with a person diagnosed with  COVID-19 in the last 2 weeks.   Tell the health care staff about your symptoms: fever, cough and shortness of breath. After you have been seen by a medical provider, you will be either: Tested for (COVID-19) and discharged home on quarantine except to seek medical care if  symptoms worsen, and asked to  Stay home and avoid contact with others until you get your results (4-5 days)  Avoid travel on public transportation if possible (such as bus, train, or airplane) or Sent to the Emergency Department by EMS for evaluation, COVID-19 testing  and  possible admission depending on your condition and test results.  What to do if you are LOW RISK for COVID-19?  Reduce your risk of any infection by using the same precautions used for avoiding the common cold or flu:  Wash your hands often with soap and warm water for at least 20 seconds.  If soap and water are not readily available,  use an alcohol-based hand sanitizer with at least 60% alcohol.  If coughing or  sneezing, cover your mouth and nose by coughing or sneezing into the elbow areas of your shirt or coat,  into a tissue or into your sleeve (not your hands). Avoid shaking hands with others and consider head nods or verbal greetings only. Avoid touching your eyes, nose, or mouth with unwashed hands.  Avoid close contact with people who are sick. Avoid places or events with large numbers of people in one location, like concerts or sporting events. Carefully consider travel plans you have or are making. If you are planning any travel outside or inside the [place], visit the CDC's  Travelers' Health webpage for the latest health notices. If you have some symptoms but not all symptoms, continue to monitor at home and seek medical attention  if your symptoms worsen. If you are having a medical emergency, call 911.   >>>>>>>>>>>>>>>>>>>>>>>>>>>>>>>>>  We Do NOT Approve of [employer] > > > > > > > > > > > > > > > > > > > > > > > > > > > > > > > > > > > > > > >  Preventive Care for Adults  A healthy lifestyle and preventive care can promote health and wellness. Preventive health guidelines for women include the following key practices. A routine yearly physical is a good way to check with your health care provider about your health and preventive screening. It is a chance to share any concerns and updates on your health and to receive a thorough exam. Visit your dentist for a routine exam and preventive care every 6 months. Brush your teeth twice a day and floss once a day. Good oral hygiene prevents tooth decay and gum disease. The frequency of eye exams is based on your age, health, family medical history, use of contact lenses, and other factors. Follow your health care provider's recommendations for frequency of eye exams. Eat a healthy diet. Foods like vegetables, fruits, whole grains, low-fat dairy products, and lean protein foods contain the nutrients you need without too many calories. Decrease your intake of foods high in solid fats, added sugars, and salt. Eat the right amount of calories for you. Get information about a proper diet from your health care provider, if necessary. Regular physical exercise is one of the most important things you can do for your health. Most adults should get at least 150 minutes of moderate-intensity exercise (any activity that increases your heart rate and causes you to sweat) each week. In addition, most adults need muscle-strengthening exercises on 2 or more days a week. Maintain a healthy weight. The body mass index (BMI) is a  screening tool to identify possible weight problems. It provides an estimate of body fat based on height and weight. Your health care provider can find your BMI and can help you achieve or maintain a healthy weight. For adults 20 years and older: A BMI below 18.5 is considered underweight. A BMI of 18.5 to 24.9 is normal. A BMI of 25 to 29.9 is considered overweight. A BMI of 30 and above is considered obese. Maintain normal blood lipids and cholesterol levels by exercising and minimizing your intake of saturated fat. Eat a balanced diet with plenty of fruit and vegetables. If your lipid or cholesterol levels are high, you are over 50, or you are at high risk for heart disease, you may need your cholesterol levels checked more frequently. Ongoing high lipid and cholesterol levels should be treated with medicines if diet and exercise are  not working. If you smoke, find out from your health care provider how to quit. If you do not use tobacco, do not start. Lung cancer screening is recommended for adults aged 69-80 years who are at high risk for developing lung cancer because of a history of smoking. A yearly low-dose CT scan of the lungs is recommended for people who have at least a 30-pack-year history of smoking and are a current smoker or have quit within the past 15 years. A pack year of smoking is smoking an average of 1 pack of cigarettes a day for 1 year (for example: 1 pack a day for 30 years or 2 packs a day for 15 years). Yearly screening should continue until the smoker has stopped smoking for at least 15 years. Yearly screening should be stopped for people who develop a health problem that would prevent them from having lung cancer treatment. Avoid use of street drugs. Do not share needles with anyone. Ask for help if you need support or instructions about stopping the use of drugs. High blood pressure causes heart disease and increases the risk of stroke.  Ongoing high blood pressure should be  treated with medicines if weight loss and exercise do not work. If you are 41-54 years old, ask your health care provider if you should take aspirin to prevent strokes. Diabetes screening involves taking a blood sample to check your fasting blood sugar level. This should be done once every 3 years, after age 8, if you are within normal weight and without risk factors for diabetes. Testing should be considered at a younger age or be carried out more frequently if you are overweight and have at least 1 risk factor for diabetes. Breast cancer screening is essential preventive care for women. You should practice "breast self-awareness." This means understanding the normal appearance and feel of your breasts and may include breast self-examination. Any changes detected, no matter how small, should be reported to a health care provider. Women in their 15s and 30s should have a clinical breast exam (CBE) by a health care provider as part of a regular health exam every 1 to 3 years. After age 80, women should have a CBE every year. Starting at age 33, women should consider having a mammogram (breast X-ray test) every year. Women who have a family history of breast cancer should talk to their health care provider about genetic screening. Women at a high risk of breast cancer should talk to their health care providers about having an MRI and a mammogram every year. Breast cancer gene (BRCA)-related cancer risk assessment is recommended for women who have family members with BRCA-related cancers. BRCA-related cancers include breast, ovarian, tubal, and peritoneal cancers. Having family members with these cancers may be associated with an increased risk for harmful changes (mutations) in the breast cancer genes BRCA1 and BRCA2. Results of the assessment will determine the need for genetic counseling and BRCA1 and BRCA2 testing. Routine pelvic exams to screen for cancer are no longer recommended for nonpregnant women who  are considered low risk for cancer of the pelvic organs (ovaries, uterus, and vagina) and who do not have symptoms. Ask your health care provider if a screening pelvic exam is right for you. If you have had past treatment for cervical cancer or a condition that could lead to cancer, you need Pap tests and screening for cancer for at least 20 years after your treatment. If Pap tests have been discontinued, your risk factors (such as  having a new sexual partner) need to be reassessed to determine if screening should be resumed. Some women have medical problems that increase the chance of getting cervical cancer. In these cases, your health care provider may recommend more frequent screening and Pap tests.  Colorectal cancer can be detected and often prevented. Most routine colorectal cancer screening begins at the age of 71 years and continues through age 40 years. However, your health care provider may recommend screening at an earlier age if you have risk factors for colon cancer. On a yearly basis, your health care provider may provide home test kits to check for hidden blood in the stool. Use of a small camera at the end of a tube, to directly examine the colon (sigmoidoscopy or colonoscopy), can detect the earliest forms of colorectal cancer. Talk to your health care provider about this at age 77, when routine screening begins.  Direct exam of the colon should be repeated every 5-10 years through age 44 years, unless early forms of pre-cancerous polyps or small growths are found. Osteoporosis is a disease in which the bones lose minerals and strength with aging. This can result in serious bone fractures or breaks. The risk of osteoporosis can be identified using a bone density scan. Women ages 43 years and over and women at risk for fractures or osteoporosis should discuss screening with their health care providers. Ask your health care provider whether you should take a calcium supplement or vitamin D to  reduce the rate of osteoporosis. Menopause can be associated with physical symptoms and risks. Hormone replacement therapy is available to decrease symptoms and risks. You should talk to your health care provider about whether hormone replacement therapy is right for you. Use sunscreen. Apply sunscreen liberally and repeatedly throughout the day. You should seek shade when your shadow is shorter than you. Protect yourself by wearing long sleeves, pants, a wide-brimmed hat, and sunglasses year round, whenever you are outdoors. Once a month, do a whole body skin exam, using a mirror to look at the skin on your back. Tell your health care provider of new moles, moles that have irregular borders, moles that are larger than a pencil eraser, or moles that have changed in shape or color. Stay current with required vaccines (immunizations). Influenza vaccine. All adults should be immunized every year. Tetanus, diphtheria, and acellular pertussis (Td, Tdap) vaccine. Pregnant women should receive 1 dose of Tdap vaccine during each pregnancy. The dose should be obtained regardless of the length of time since the last dose. Immunization is preferred during the 27th-36th week of gestation. An adult who has not previously received Tdap or who does not know her vaccine status should receive 1 dose of Tdap. This initial dose should be followed by tetanus and diphtheria toxoids (Td) booster doses every 10 years. Adults with an unknown or incomplete history of completing a 3-dose immunization series with Td-containing vaccines should begin or complete a primary immunization series including a Tdap dose. Adults should receive a Td booster every 10 years.  Zoster vaccine. One dose is recommended for adults aged 67 years or older unless certain conditions are present.  Pneumococcal 13-valent conjugate (PCV13) vaccine. When indicated, a person who is uncertain of her immunization history and has no record of immunization should  receive the PCV13 vaccine. An adult aged 79 years or older who has certain medical conditions and has not been previously immunized should receive 1 dose of PCV13 vaccine. This PCV13 should be followed with a  dose of pneumococcal polysaccharide (PPSV23) vaccine. The PPSV23 vaccine dose should be obtained at least 1 or more year(s) after the dose of PCV13 vaccine. An adult aged 32 years or older who has certain medical conditions and previously received 1 or more doses of PPSV23 vaccine should receive 1 dose of PCV13. The PCV13 vaccine dose should be obtained 1 or more years after the last PPSV23 vaccine dose.  Pneumococcal polysaccharide (PPSV23) vaccine. When PCV13 is also indicated, PCV13 should be obtained first. All adults aged 15 years and older should be immunized. An adult younger than age 46 years who has certain medical conditions should be immunized. Any person who resides in a nursing home or long-term care facility should be immunized. An adult smoker should be immunized. People with an immunocompromised condition and certain other conditions should receive both PCV13 and PPSV23 vaccines. People with human immunodeficiency virus (HIV) infection should be immunized as soon as possible after diagnosis. Immunization during chemotherapy or radiation therapy should be avoided. Routine use of PPSV23 vaccine is not recommended for American Indians, [place] Natives, or people younger than 65 years unless there are medical conditions that require PPSV23 vaccine. When indicated, people who have unknown immunization and have no record of immunization should receive PPSV23 vaccine. One-time revaccination 5 years after the first dose of PPSV23 is recommended for people aged 19-64 years who have chronic kidney failure, nephrotic syndrome, asplenia, or immunocompromised conditions. People who received 1-2 doses of PPSV23 before age 50 years should receive another dose of PPSV23 vaccine at age 36 years or later if at  least 5 years have passed since the previous dose. Doses of PPSV23 are not needed for people immunized with PPSV23 at or after age 33 years.  Preventive Services / Frequency  Ages 32 years and over Blood pressure check. Lipid and cholesterol check. Lung cancer screening. / Every year if you are aged 6-80 years and have a 30-pack-year history of smoking and currently smoke or have quit within the past 15 years. Yearly screening is stopped once you have quit smoking for at least 15 years or develop a health problem that would prevent you from having lung cancer treatment. Clinical breast exam.** / Every year after age 30 years.  BRCA-related cancer risk assessment.** / For women who have family members with a BRCA-related cancer (breast, ovarian, tubal, or peritoneal cancers). Mammogram.** / Every year beginning at age 63 years and continuing for as long as you are in good health. Consult with your health care provider. Pap test.** / Every 3 years starting at age 41 years through age 34 or 84 years with 3 consecutive normal Pap tests. Testing can be stopped between 65 and 70 years with 3 consecutive normal Pap tests and no abnormal Pap or HPV tests in the past 10 years. Fecal occult blood test (FOBT) of stool. / Every year beginning at age 29 years and continuing until age 47 years. You may not need to do this test if you get a colonoscopy every 10 years. Flexible sigmoidoscopy or colonoscopy.** / Every 5 years for a flexible sigmoidoscopy or every 10 years for a colonoscopy beginning at age 60 years and continuing until age 36 years. Hepatitis C blood test.** / For all people born from [birth year] through 1963 and any individual with known risks for hepatitis C. Osteoporosis screening.** / A one-time screening for women ages 70 years and over and women at risk for fractures or osteoporosis. Skin self-exam. / Monthly. Influenza vaccine. / Every  year. Tetanus, diphtheria, and acellular pertussis (Tdap/Td)  vaccine.** / 1 dose of Td every 10 years. Zoster vaccine.** / 1 dose for adults aged 25 years or older. Pneumococcal 13-valent conjugate (PCV13) vaccine.** / Consult your health care provider. Pneumococcal polysaccharide (PPSV23) vaccine.** / 1 dose for all adults aged 43 years and older. Screening for abdominal aortic aneurysm (AAA)  by ultrasound is recommended for people who have history of high blood pressure or who are current or former smokers. ++++++++++++++++++++ Recommend Adult Low Dose Aspirin or  coated  Aspirin 81 mg daily  To reduce risk of Colon Cancer 40 %,  Skin Cancer 26 % ,  Melanoma 46%  and  Pancreatic cancer 60% ++++++++++++++++++++ Vitamin D goal  is between 70-100.  Please make sure that you are taking your Vitamin D as directed.  It is very important as a natural anti-inflammatory  helping hair, skin, and nails, as well as reducing stroke and heart attack risk.  It helps your bones and helps with mood. It also decreases numerous cancer risks so please take it as directed.  Low Vit D is associated with a 200-300% higher risk for CANCER  and 200-300% higher risk for HEART   ATTACK  &  STROKE.   .....................................Marland Kitchen It is also associated with higher death rate at younger ages,  autoimmune diseases like Rheumatoid arthritis, Lupus, Multiple Sclerosis.    Also many other serious conditions, like depression, Alzheimer's Dementia, infertility, muscle aches, fatigue, fibromyalgia - just to name a few. ++++++++++++++++++ Recommend the book "The END of DIETING" by Dr Excell Seltzer  & the book "The END of DIABETES " by Dr Excell Seltzer At [email or web address] - get book & Audio CD's    Being diabetic has a  300% increased risk for heart attack, stroke, cancer, and alzheimer- type vascular dementia. It is very important that you work harder with diet by avoiding all foods that are white. Avoid white rice (brown & wild rice is OK), white potatoes (sweetpotatoes in  moderation is OK), White bread or wheat bread or anything made out of white flour like bagels, donuts, rolls, buns, biscuits, cakes, pastries, cookies, pizza crust, and pasta (made from white flour & egg whites) - vegetarian pasta or spinach or wheat pasta is OK. Multigrain breads like Arnold's or [employer], or multigrain sandwich thins or flatbreads.  Diet, exercise and weight loss can reverse and cure diabetes in the early stages.  Diet, exercise and weight loss is very important in the control and prevention of complications of diabetes which affects every system in your body, ie. Brain - dementia/stroke, eyes - glaucoma/blindness, heart - heart attack/heart failure, kidneys - dialysis, stomach - gastric paralysis, intestines - malabsorption, nerves - severe painful neuritis, circulation - gangrene & loss of a leg(s), and finally cancer and Alzheimers.    I recommend avoid fried & greasy foods,  sweets/candy, white rice (brown or wild rice or Quinoa is OK), white potatoes (sweet potatoes are OK) - anything made from white flour - bagels, doughnuts, rolls, buns, biscuits,white and wheat breads, pizza crust and traditional pasta made of white flour & egg white(vegetarian pasta or spinach or wheat pasta is OK).  Multi-grain bread is OK - like multi-grain flat bread or sandwich thins. Avoid alcohol in excess. Exercise is also important.    Eat all the vegetables you want - avoid meat, especially red meat and dairy - especially cheese.  Cheese is the most concentrated form of trans-fats which is the worst thing  to clog up our arteries. Veggie cheese is OK which can be found in the fresh produce section at [employer] or [employer] or [employer]  +++++++++++++++++++ DASH Eating Plan  DASH stands for "Dietary Approaches to Stop Hypertension."   The DASH eating plan is a healthy eating plan that has been shown to reduce high blood pressure (hypertension). Additional health benefits may include reducing  the risk of type 2 diabetes mellitus, heart disease, and stroke. The DASH eating plan may also help with weight loss. WHAT DO I NEED TO KNOW ABOUT THE DASH EATING PLAN? For the DASH eating plan, you will follow these general guidelines: Choose foods with a percent daily value for sodium of less than 5% (as listed on the food label). Use salt-free seasonings or herbs instead of table salt or sea salt. Check with your health care provider or pharmacist before using salt substitutes. Eat lower-sodium products, often labeled as "lower sodium" or "no salt added." Eat fresh foods. Eat more vegetables, fruits, and low-fat dairy products. Choose whole grains. Look for the word "whole" as the first word in the ingredient list. Choose fish  Limit sweets, desserts, sugars, and sugary drinks. Choose heart-healthy fats. Eat veggie cheese  Eat more home-cooked food and less restaurant, buffet, and fast food. Limit fried foods. Cook foods using methods other than frying. Limit canned vegetables. If you do use them, rinse them well to decrease the sodium. When eating at a restaurant, ask that your food be prepared with less salt, or no salt if possible.                      WHAT FOODS CAN I EAT? Read Dr Fara Olden Fuhrman's books on The End of Dieting & The End of Diabetes  Grains Whole grain or whole wheat bread. Brown rice. Whole grain or whole wheat pasta. Quinoa, bulgur, and whole grain cereals. Low-sodium cereals. Corn or whole wheat flour tortillas. Whole grain cornbread. Whole grain crackers. Low-sodium crackers.  Vegetables Fresh or frozen vegetables (raw, steamed, roasted, or grilled). Low-sodium or reduced-sodium tomato and vegetable juices. Low-sodium or reduced-sodium tomato sauce and paste. Low-sodium or reduced-sodium canned vegetables.   Fruits All fresh, canned (in natural juice), or frozen fruits.  Protein Products  All fish and seafood.  Dried beans, peas, or lentils. Unsalted nuts and  seeds. Unsalted canned beans.  Dairy Low-fat dairy products, such as skim or 1% milk, 2% or reduced-fat cheeses, low-fat ricotta or cottage cheese, or plain low-fat yogurt. Low-sodium or reduced-sodium cheeses.  Fats and Oils Tub margarines without trans fats. Light or reduced-fat mayonnaise and salad dressings (reduced sodium). Avocado. Safflower, olive, or canola oils. Natural peanut or almond butter.  Other Unsalted popcorn and pretzels. The items listed above may not be a complete list of recommended foods or beverages. Contact your dietitian for more options.  +++++++++++++++  WHAT FOODS ARE NOT RECOMMENDED? Grains/ White flour or wheat flour White bread. White pasta. White rice. Refined cornbread. Bagels and croissants. Crackers that contain trans fat.  Vegetables  Creamed or fried vegetables. Vegetables in a . Regular canned vegetables. Regular canned tomato sauce and paste. Regular tomato and vegetable juices.  Fruits Dried fruits. Canned fruit in light or heavy syrup. Fruit juice.  Meat and Other Protein Products Meat in general - RED meat & White meat.  Fatty cuts of meat. Ribs, chicken wings, all processed meats as bacon, sausage, bologna, salami, fatback, hot dogs, bratwurst and packaged luncheon meats.  Dairy  Whole or 2% milk, cream, half-and-half, and cream cheese. Whole-fat or sweetened yogurt. Full-fat cheeses or blue cheese. Non-dairy creamers and whipped toppings. Processed cheese, cheese spreads, or cheese curds.  Condiments Onion and garlic salt, seasoned salt, table salt, and sea salt. Canned and packaged gravies. Worcestershire sauce. Tartar sauce. Barbecue sauce. Teriyaki sauce. Soy sauce, including reduced sodium. Steak sauce. Fish sauce. Oyster sauce. Cocktail sauce. Horseradish. Ketchup and mustard. Meat flavorings and tenderizers. Bouillon cubes. Hot sauce. Tabasco sauce. Marinades. Taco seasonings. Relishes.  Fats and Oils Butter, stick margarine, lard,  shortening and bacon fat. Coconut, palm kernel, or palm oils. Regular salad dressings.  Pickles and olives. Salted popcorn and pretzels.  The items listed above may not be a complete list of foods and beverages to avoid.

## 2023-07-15 ENCOUNTER — Ambulatory Visit: Payer: Medicare Other | Admitting: Internal Medicine

## 2023-07-15 VITALS — BP 136/80 | HR 75 | Temp 97.9°F | Resp 16 | Ht 62.0 in | Wt 101.6 lb

## 2023-07-15 DIAGNOSIS — Z1211 Encounter for screening for malignant neoplasm of colon: Secondary | ICD-10-CM

## 2023-07-15 DIAGNOSIS — E782 Mixed hyperlipidemia: Secondary | ICD-10-CM | POA: Diagnosis not present

## 2023-07-15 DIAGNOSIS — I5042 Chronic combined systolic (congestive) and diastolic (congestive) heart failure: Secondary | ICD-10-CM | POA: Diagnosis not present

## 2023-07-15 DIAGNOSIS — R7309 Other abnormal glucose: Secondary | ICD-10-CM | POA: Diagnosis not present

## 2023-07-15 DIAGNOSIS — R0989 Other specified symptoms and signs involving the circulatory and respiratory systems: Secondary | ICD-10-CM | POA: Diagnosis not present

## 2023-07-15 DIAGNOSIS — Z8249 Family history of ischemic heart disease and other diseases of the circulatory system: Secondary | ICD-10-CM

## 2023-07-15 DIAGNOSIS — Z87891 Personal history of nicotine dependence: Secondary | ICD-10-CM | POA: Diagnosis not present

## 2023-07-15 DIAGNOSIS — D75839 Thrombocytosis, unspecified: Secondary | ICD-10-CM

## 2023-07-15 DIAGNOSIS — Z79899 Other long term (current) drug therapy: Secondary | ICD-10-CM

## 2023-07-15 DIAGNOSIS — N1831 Chronic kidney disease, stage 3a: Secondary | ICD-10-CM

## 2023-07-15 DIAGNOSIS — E538 Deficiency of other specified B group vitamins: Secondary | ICD-10-CM | POA: Diagnosis not present

## 2023-07-15 DIAGNOSIS — I700 Atherosclerosis of aorta: Secondary | ICD-10-CM

## 2023-07-15 DIAGNOSIS — I4819 Other persistent atrial fibrillation: Secondary | ICD-10-CM | POA: Diagnosis not present

## 2023-07-15 DIAGNOSIS — Z136 Encounter for screening for cardiovascular disorders: Secondary | ICD-10-CM

## 2023-07-15 DIAGNOSIS — E559 Vitamin D deficiency, unspecified: Secondary | ICD-10-CM

## 2023-07-16 ENCOUNTER — Other Ambulatory Visit: Payer: Self-pay | Admitting: Internal Medicine

## 2023-07-16 DIAGNOSIS — N3000 Acute cystitis without hematuria: Secondary | ICD-10-CM

## 2023-07-16 LAB — LIPID PANEL
Cholesterol: 142 mg/dL (ref <200)
HDL: 59 mg/dL (ref >=50)
LDL Cholesterol (Calc): 62 mg/dL
Non-HDL Cholesterol (Calc): 83 mg/dL (ref <130)
Total CHOL/HDL Ratio: 2.4 (calc) (ref <5.0)
Triglycerides: 130 mg/dL (ref <150)

## 2023-07-16 LAB — COMPLETE METABOLIC PANEL WITH GFR
AG Ratio: 1.4 (calc) (ref 1.0–2.5)
ALT: 15 U/L (ref 6–29)
AST: 16 U/L (ref 10–35)
Albumin: 4 g/dL (ref 3.6–5.1)
Alkaline phosphatase (APISO): 63 U/L (ref 37–153)
BUN/Creatinine Ratio: 33 (calc) — ABNORMAL HIGH (ref 6–22)
BUN: 38 mg/dL — ABNORMAL HIGH (ref 7–25)
CO2: 29 mmol/L (ref 20–32)
Calcium: 10.3 mg/dL (ref 8.6–10.4)
Chloride: 107 mmol/L (ref 98–110)
Creat: 1.14 mg/dL — ABNORMAL HIGH (ref 0.60–0.95)
Globulin: 2.8 g/dL (ref 1.9–3.7)
Glucose, Bld: 110 mg/dL — ABNORMAL HIGH (ref 65–99)
Potassium: 5.2 mmol/L (ref 3.5–5.3)
Sodium: 144 mmol/L (ref 135–146)
Total Bilirubin: 0.4 mg/dL (ref 0.2–1.2)
Total Protein: 6.8 g/dL (ref 6.1–8.1)
eGFR: 45 mL/min/{1.73_m2} — ABNORMAL LOW (ref >=60)

## 2023-07-16 LAB — URINALYSIS, ROUTINE W REFLEX MICROSCOPIC
Bilirubin Urine: NEGATIVE
Glucose, UA: NEGATIVE
Hgb urine dipstick: NEGATIVE
Hyaline Cast: NONE SEEN /[LPF]
Ketones, ur: NEGATIVE
Nitrite: NEGATIVE
Protein, ur: NEGATIVE
Specific Gravity, Urine: 1.022 (ref 1.001–1.035)
pH: 6.5 (ref 5.0–8.0)

## 2023-07-16 LAB — CBC WITH DIFFERENTIAL/PLATELET
Absolute Lymphocytes: 1126 {cells}/uL (ref 850–3900)
Absolute Monocytes: 739 {cells}/uL (ref 200–950)
Basophils Absolute: 118 {cells}/uL (ref 0–200)
Basophils Relative: 1.4 %
Eosinophils Absolute: 143 {cells}/uL (ref 15–500)
Eosinophils Relative: 1.7 %
HCT: 35.4 % (ref 35.0–45.0)
Hemoglobin: 11.5 g/dL — ABNORMAL LOW (ref 11.7–15.5)
MCH: 31.1 pg (ref 27.0–33.0)
MCHC: 32.5 g/dL (ref 32.0–36.0)
MCV: 95.7 fL (ref 80.0–100.0)
MPV: 9.2 fL (ref 7.5–12.5)
Monocytes Relative: 8.8 %
Neutro Abs: 6275 {cells}/uL (ref 1500–7800)
Neutrophils Relative %: 74.7 %
Platelets: 599 10*3/uL — ABNORMAL HIGH (ref 140–400)
RBC: 3.7 10*6/uL — ABNORMAL LOW (ref 3.80–5.10)
RDW: 18.3 % — ABNORMAL HIGH (ref 11.0–15.0)
Total Lymphocyte: 13.4 %
WBC: 8.4 10*3/uL (ref 3.8–10.8)

## 2023-07-16 LAB — PARATHYROID HORMONE, INTACT (NO CA): PTH: 58 pg/mL (ref 16–77)

## 2023-07-16 LAB — HEMOGLOBIN A1C
Hgb A1c MFr Bld: 5.7 %{Hb} — ABNORMAL HIGH (ref <5.7)
Mean Plasma Glucose: 117 mg/dL
eAG (mmol/L): 6.5 mmol/L

## 2023-07-16 LAB — TSH: TSH: 2.91 m[IU]/L (ref 0.40–4.50)

## 2023-07-16 LAB — INSULIN, RANDOM: Insulin: 35.4 u[IU]/mL — ABNORMAL HIGH

## 2023-07-16 LAB — VITAMIN D 25 HYDROXY (VIT D DEFICIENCY, FRACTURES): Vit D, 25-Hydroxy: 59 ng/mL (ref 30–100)

## 2023-07-16 LAB — MAGNESIUM: Magnesium: 2.3 mg/dL (ref 1.5–2.5)

## 2023-07-16 LAB — VITAMIN B12: Vitamin B-12: 445 pg/mL (ref 200–1100)

## 2023-07-16 LAB — MICROSCOPIC MESSAGE

## 2023-07-16 NOTE — Progress Notes (Signed)
[][][][][][][][][][][][][][][][][][][][][][][][][][][][][][][][][][][][][][][][][]][][][][][][][][][][][][][][][][][][][][][][][[][][][][]  [][][][][][][][][][][][][][][][][][][][][][][][][][][][][][][][][][][][][][][][][]][][][][][][][][][][][][][][][][][][][][][][][[][][][][]  -  Test results slightly outside the reference range are not unusual. If there is anything important, I will review this with you,  otherwise it is considered normal test values.  If you have further questions,  please do not hesitate to contact me at the office or via My Chart.   [] [] [] [] [] [] [] [] [] [] [] [] [] [] [] [] [] [] [] [] [] [] [] [] [] [] [] [] [] [] [] [] [] [] [] [] [] [] [] [] [] ][] [] [] [] [] [] [] [] [] [] [] [] [] [] [] [] [] [] [] [] [] [] [[] [] [] [] []   [] [] [] [] [] [] [] [] [] [] [] [] [] [] [] [] [] [] [] [] [] [] [] [] [] [] [] [] [] [] [] [] [] [] [] [] [] [] [] [] [] ][] [] [] [] [] [] [] [] [] [] [] [] [] [] [] [] [] [] [] [] [] [] [[] [] [] [] []   -  U/A is very suspicious for Infection & have asked lab to do a Urine Culture   [] [] [] [] [] [] [] [] [] [] [] [] [] [] [] [] [] [] [] [] [] [] [] [] [] [] [] [] [] [] [] [] [] [] [] [] [] [] [] [] [] ][] [] [] [] [] [] [] [] [] [] [] [] [] [] [] [] [] [] [] [] [] [] [[] [] [] [] []   -  CBC shows stable mild chronic anemia & is OK   - Platelet counts OK   [] [] [] [] [] [] [] [] [] [] [] [] [] [] [] [] [] [] [] [] [] [] [] [] [] [] [] [] [] [] [] [] [] [] [] [] [] [] [] [] [] ][] [] [] [] [] [] [] [] [] [] [] [] [] [] [] [] [] [] [] [] [] [] [[] [] [] [] []   -  Kidney functions still stable in Stage 3a  &  OK   [] [] [] [] [] [] [] [] [] [] [] [] [] [] [] [] [] [] [] [] [] [] [] [] [] [] [] [] [] [] [] [] [] [] [] [] [] [] [] [] [] ][] [] [] [] [] [] [] [] [] [] [] [] [] [] [] [] [] [] [] [] [] [] [[] [] [] [] []   -  A1c - 12 week average Blood Sugar is OK    [] [] [] [] [] [] [] [] [] [] [] [] [] [] [] [] [] [] [] [] [] [] [] [] [] [] [] [] [] [] [] [] [] [] [] [] [] [] [] [] [] ][] [] [] [] [] [] [] [] [] [] [] [] [] [] [] [] [] [] [] [] [] [] [[] [] [] [] []   -  PTH hormone that regulates calcium  balance is Normal - Great !                               ( Calcium  level is also Normal - Great 1  )  [] [] [] [] [] [] [] [] [] [] [] [] [] [] [] [] [] [] [] [] [] [] [] [] [] [] [] [] [] [] [] [] [] [] [] [] [] [] [] [] [] ][] [] [] [] [] [] [] [] [] [] [] [] [] [] [] [] [] [] [] [] [] [] [[] [] [] [] []   -  Vitamin B12 is Normal - reflects good diet !   [] [] [] [] [] [] [] [] [] [] [] [] [] [] [] [] [] [] [] [] [] [] [] [] [] [] [] [] [] [] [] [] [] [] [] [] [] [] [] [] [] ][] [] [] [] [] [] [] [] [] [] [] [] [] [] [] [] [] [] [] [] [] [] [[] [] [] [] []   -  Chol = 142 - Excellent !    [] [] [] [] [] [] [] [] [] [] [] [] [] [] [] [] [] [] [] [] [] [] [] [] [] [] [] [] [] [] [] [] [] [] [] [] [] [] [] [] [] ][] [] [] [] [] [] [] [] [] [] [] [] [] [] [] [] [] [] [] [] [] [] [[] [] [] [] []   -  Vitamin D  = 59 - Excellent  - Please keep dose same   [] [] [] [] [] [] [] [] [] [] [] [] [] [] [] [] [] [] [] [] [] [] [] [] [] [] [] [] [] [] [] [] [] [] [] [] [] [] [] [] [] ][] [] [] [] [] [] [] [] [] [] [] [] [] [] [] [] [] [] [] [] [] [] [[] [] [] [] []   -  All Else -  Electrolytes - Liver - Magnesium & Thyroid     - all  Normal / OK  [] [] [] [] [] [] [] [] [] [] [] [] [] [] [] [] [] [] [] [] [] [] [] [] [] [] [] [] [] [] [] [] [] [] [] [] [] [] [] [] [] ][] [] [] [] [] [] [] [] [] [] [] [] [] [] [] [] [] [] [] [] [] [] [[] [] [] [] []

## 2023-07-18 ENCOUNTER — Inpatient Hospital Stay (HOSPITAL_BASED_OUTPATIENT_CLINIC_OR_DEPARTMENT_OTHER): Payer: Medicare Other | Admitting: Adult Health

## 2023-07-18 ENCOUNTER — Encounter: Payer: Self-pay | Admitting: Adult Health

## 2023-07-18 ENCOUNTER — Inpatient Hospital Stay: Payer: Medicare Other | Attending: Hematology and Oncology

## 2023-07-18 VITALS — BP 148/81 | HR 84 | Temp 97.7°F | Resp 16 | Wt 98.8 lb

## 2023-07-18 DIAGNOSIS — D75839 Thrombocytosis, unspecified: Secondary | ICD-10-CM

## 2023-07-18 DIAGNOSIS — Z79899 Other long term (current) drug therapy: Secondary | ICD-10-CM | POA: Diagnosis not present

## 2023-07-18 DIAGNOSIS — D473 Essential (hemorrhagic) thrombocythemia: Secondary | ICD-10-CM | POA: Insufficient documentation

## 2023-07-18 DIAGNOSIS — Z7964 Long term (current) use of myelosuppressive agent: Secondary | ICD-10-CM | POA: Diagnosis not present

## 2023-07-18 LAB — URINE CULTURE
MICRO NUMBER:: 15911290
SPECIMEN QUALITY:: ADEQUATE

## 2023-07-18 LAB — CBC WITH DIFFERENTIAL (CANCER CENTER ONLY)
Abs Immature Granulocytes: 0.03 10*3/uL (ref 0.00–0.07)
Basophils Absolute: 0.1 10*3/uL (ref 0.0–0.1)
Basophils Relative: 1 %
Eosinophils Absolute: 0.2 10*3/uL (ref 0.0–0.5)
Eosinophils Relative: 2 %
HCT: 34.8 % — ABNORMAL LOW (ref 36.0–46.0)
Hemoglobin: 11.5 g/dL — ABNORMAL LOW (ref 12.0–15.0)
Immature Granulocytes: 0 %
Lymphocytes Relative: 11 %
Lymphs Abs: 1.1 10*3/uL (ref 0.7–4.0)
MCH: 30.9 pg (ref 26.0–34.0)
MCHC: 33 g/dL (ref 30.0–36.0)
MCV: 93.5 fL (ref 80.0–100.0)
Monocytes Absolute: 0.9 10*3/uL (ref 0.1–1.0)
Monocytes Relative: 9 %
Neutro Abs: 7.5 10*3/uL (ref 1.7–7.7)
Neutrophils Relative %: 77 %
Platelet Count: 513 10*3/uL — ABNORMAL HIGH (ref 150–400)
RBC: 3.72 MIL/uL — ABNORMAL LOW (ref 3.87–5.11)
RDW: 21 % — ABNORMAL HIGH (ref 11.5–15.5)
WBC Count: 9.8 10*3/uL (ref 4.0–10.5)
nRBC: 0 % (ref 0.0–0.2)

## 2023-07-18 NOTE — Progress Notes (Signed)
 [facility] Cancer Follow up:    Leslie Fallow, MD [address] [place] [address]   DIAGNOSIS: Essential Thrombocytosis  SUMMARY OF HEMATOLOGIC HISTORY: 10/09/2021: Platelets 496 04/18/2022: Platelets 572 07/29/2022: Platelets 706 08/20/22: Platelets 605, Iron studies: Sat: 20%, Ferritin 36, Cr: 1.18, MPN Panel: CALR mutation 11/12/2022: Platelets 781 12/03/2022: Platelets 784 06/05/2023: Platelets 1075 06/17/2023: Platelets  CURRENT THERAPY: Hydrea  500 mg daily  INTERVAL HISTORY: Leslie [name] 89 y.o. female  diagnosed with essential thrombocytosis, has been on hydroxyurea  therapy. She reports no issues with nausea or vomiting and overall, she feels well. She is also on Eliquis , a blood thinner, which she understands may increase her risk of bleeding. She has been seeing a hematologist, Dr. Gudena, monthly for monitoring of her elevated platelet count.  Her platelet count has shown a decreasing trend from a high of 1075 to the current level of 513. The patient understands that the normal range is between 150 and 450, and that the goal of her hydroxyurea  therapy is to bring her platelet count within this range without causing it to drop too low. She is aware that her medication regimen may need to be modified in the future, but for now, the plan is to continue with the current dose of hydroxyurea  and monitor her platelet count monthly.  Patient Active Problem List   Diagnosis Date Noted   Intractable diarrhea 02/26/2023   Aortic atherosclerosis (HCC) - CT 09/2020 10/11/2020   Pulmonary emphysema (HCC) - per CT 09/2020 10/11/2020   Slow transit constipation    Chronic combined systolic and diastolic CHF (congestive heart failure) (HCC)    Chronic low back pain without sciatica    History of embolic stroke 10/01/2020   DNR no code (do not resuscitate) 10/01/2020   Thrombocytosis 10/01/2020   Body mass index (BMI) of 19.0-19.9 in adult 05/15/2020   CKD  (chronic kidney disease) stage 3, GFR 30-59 ml/min (HCC) 05/15/2020   Labile hypertension 06/16/2018   FHx: heart disease 06/16/2018   Former smoker 06/16/2018   Abnormal glucose 06/16/2018   Anxiety 03/04/2018   Medication management 04/08/2014   Vitamin D  deficiency 07/01/2013   Hyperlipidemia, mixed 07/01/2013   LBBB (left bundle branch block) 02/20/2010    is allergic to advil [ibuprofen], atenolol, and adhesive [tape].  MEDICAL HISTORY: Past Medical History:  Diagnosis Date   Anxiety disorder    Cardiomyopathy (HCC)    Glaucoma    Hearing loss    HTN (hypertension)    Hyperglycemia    Hyperlipidemia    LBBB (left bundle branch block)    Stroke (cerebrum) (HCC)    Vitamin D  deficiency     SURGICAL HISTORY: Past Surgical History:  Procedure Laterality Date   GLAUCOMA SURGERY  2013   TONSILLECTOMY      SOCIAL HISTORY: Social History   Socioeconomic History   Marital status: Married    Spouse name: Not on file   Number of children: Not on file   Years of education: Not on file   Highest education level: Not on file  Occupational History   Not on file  Tobacco Use   Smoking status: Former    Current packs/day: 0.00    Types: Cigarettes    Quit date: 07/16/1979    Years since quitting: 44.0   Smokeless tobacco: Never  Vaping Use   Vaping status: Never Used  Substance and Sexual Activity   Alcohol use: No    Alcohol/week: 0.0 standard drinks  of alcohol   Drug use: No   Sexual activity: Not Currently  Other Topics Concern   Not on file  Social History Narrative   Not on file   Social Drivers of Health   Financial Resource Strain: Not on file  Food Insecurity: No Food Insecurity (02/26/2023)   Hunger Vital Sign    Worried About Running Out of Food in the Last Year: Never true    Ran Out of Food in the Last Year: Never true  Transportation Needs: No Transportation Needs (02/26/2023)   PRAPARE - Administrator, Civil Service (Medical): No     Lack of Transportation (Non-Medical): No  Physical Activity: Not on file  Stress: Not on file  Social Connections: Not on file  Intimate Partner Violence: Not At Risk (02/26/2023)   Humiliation, Afraid, Rape, and Kick questionnaire    Fear of Current or Ex-Partner: No    Emotionally Abused: No    Physically Abused: No    Sexually Abused: No    FAMILY HISTORY: Family History  Problem Relation Age of Onset   Heart attack Brother    Heart disease Brother    Heart attack Brother    Heart disease Brother    Hypertension Mother    Stroke Mother    Heart attack Father    Diabetes Father     Review of Systems  Constitutional:  Negative for appetite change, chills, fatigue, fever and unexpected weight change.  HENT:   Negative for hearing loss, lump/mass and trouble swallowing.   Eyes:  Negative for eye problems and icterus.  Respiratory:  Negative for chest tightness, cough and shortness of breath.   Cardiovascular:  Negative for chest pain, leg swelling and palpitations.  Gastrointestinal:  Negative for abdominal distention, abdominal pain, constipation, diarrhea, nausea and vomiting.  Endocrine: Negative for hot flashes.  Genitourinary:  Negative for difficulty urinating.   Musculoskeletal:  Negative for arthralgias.  Skin:  Negative for itching and rash.  Neurological:  Negative for dizziness, extremity weakness, headaches and numbness.  Hematological:  Negative for adenopathy. Does not bruise/bleed easily.  Psychiatric/Behavioral:  Negative for depression. The patient is not nervous/anxious.       PHYSICAL EXAMINATION    Vitals:   07/18/23 1348  BP: (!) 148/81  Pulse: 84  Resp: 16  Temp: 97.7 F (36.5 C)  SpO2: 100%    Physical Exam Constitutional:      General: She is not in acute distress.    Appearance: Normal appearance. She is not toxic-appearing.  HENT:     Head: Normocephalic and atraumatic.     Mouth/Throat:     Mouth: Mucous membranes are moist.      Pharynx: Oropharynx is clear. No oropharyngeal exudate or posterior oropharyngeal erythema.  Eyes:     General: No scleral icterus. Cardiovascular:     Rate and Rhythm: Normal rate and regular rhythm.     Pulses: Normal pulses.     Heart sounds: Normal heart sounds.  Pulmonary:     Effort: Pulmonary effort is normal.     Breath sounds: Normal breath sounds.  Abdominal:     General: Abdomen is flat. Bowel sounds are normal. There is no distension.     Palpations: Abdomen is soft.     Tenderness: There is no abdominal tenderness.  Musculoskeletal:        General: No swelling.     Cervical back: Neck supple.  Lymphadenopathy:     Cervical: No cervical adenopathy.  Skin:    General: Skin is warm and dry.     Findings: No rash.  Neurological:     General: No focal deficit present.     Mental Status: She is alert.  Psychiatric:        Mood and Affect: Mood normal.        Behavior: Behavior normal.     LABORATORY DATA:  CBC    Component Value Date/Time   WBC 9.8 07/18/2023 1317   WBC 8.4 07/15/2023 1054   RBC 3.72 (L) 07/18/2023 1317   HGB 11.5 (L) 07/18/2023 1317   HGB 13.5 11/29/2020 0941   HCT 34.8 (L) 07/18/2023 1317   HCT 40.6 11/29/2020 0941   PLT 513 (H) 07/18/2023 1317   PLT 402 11/29/2020 0941   MCV 93.5 07/18/2023 1317   MCV 89 11/29/2020 0941   MCH 30.9 07/18/2023 1317   MCHC 33.0 07/18/2023 1317   RDW 21.0 (H) 07/18/2023 1317   RDW 15.5 (H) 11/29/2020 0941   LYMPHSABS 1.1 07/18/2023 1317   MONOABS 0.9 07/18/2023 1317   EOSABS 0.2 07/18/2023 1317   BASOSABS 0.1 07/18/2023 1317       ASSESSMENT and THERAPY PLAN:   Thrombocytosis Lab review: 10/09/2021: Platelets 496 04/18/2022: Platelets 572 07/29/2022: Platelets 706 08/20/22: Platelets 605, Iron studies: Sat: 20%, Ferritin 36, Cr: 1.18, MPN Panel: CALR mutation 11/12/2022: Platelets 781 12/03/2022: Platelets 784 06/05/2023: Platelets 1075 06/17/2023: Platelets   Essential Thrombocytosis Platelet  count has been decreasing with hydroxyurea  treatment, currently at 513 (previously 599). No reported side effects from hydroxyurea . -Continue hydroxyurea  500mg , once daily. -Check platelet count in one month to monitor response to treatment.  Anticoagulation Patient is on Eliquis , no reported bleeding complications. -Continue Eliquis  as prescribed.  Follow-up -Schedule appointment in one month for lab work and assessment.   All questions were answered. The patient knows to call the clinic with any problems, questions or concerns. We can certainly see the patient much sooner if necessary.  Total encounter time:20 minutes*in face-to-face visit time, chart review, lab review, care coordination, order entry, and documentation of the encounter time.    Morna Kendall, NP 07/18/23 4:14 PM Medical Oncology and Hematology [facility] [address] [place], [place] [address] Tel. [phone]    Fax. [phone]  *Total Encounter Time as defined by the Centers for Medicare and Medicaid Services includes, in addition to the face-to-face time of a patient visit (documented in the note above) non-face-to-face time: obtaining and reviewing outside history, ordering and reviewing medications, tests or procedures, care coordination (communications with other health care professionals or caregivers) and documentation in the medical record.

## 2023-07-18 NOTE — Assessment & Plan Note (Signed)
 Lab review: 10/09/2021: Platelets 496 04/18/2022: Platelets 572 07/29/2022: Platelets 706 08/20/22: Platelets 605, Iron studies: Sat: 20%, Ferritin 36, Cr: 1.18, MPN Panel: CALR mutation 11/12/2022: Platelets 781 12/03/2022: Platelets 784 06/05/2023: Platelets 1075 06/17/2023: Platelets   Essential Thrombocytosis Platelet count has been decreasing with hydroxyurea  treatment, currently at 513 (previously 599). No reported side effects from hydroxyurea . -Continue hydroxyurea  500mg , once daily. -Check platelet count in one month to monitor response to treatment.  Anticoagulation Patient is on Eliquis , no reported bleeding complications. -Continue Eliquis  as prescribed.  Follow-up -Schedule appointment in one month for lab work and assessment.

## 2023-07-19 NOTE — Progress Notes (Signed)
<><><><><><><><><><><><><><><><><><><><><><><><><><><><><><><><><>  -    Urine Culture - OK - No Infection  <><><><><><><><><><><><><><><><><><><><><><><><><><><><><><><><><>

## 2023-07-20 ENCOUNTER — Encounter: Payer: Self-pay | Admitting: Internal Medicine

## 2023-07-30 ENCOUNTER — Ambulatory Visit: Payer: Medicare Other | Admitting: Nurse Practitioner

## 2023-08-05 ENCOUNTER — Other Ambulatory Visit: Payer: Self-pay | Admitting: Internal Medicine

## 2023-08-05 DIAGNOSIS — F419 Anxiety disorder, unspecified: Secondary | ICD-10-CM

## 2023-08-07 ENCOUNTER — Ambulatory Visit: Payer: Medicare Other | Admitting: Nurse Practitioner

## 2023-08-18 ENCOUNTER — Other Ambulatory Visit: Payer: Self-pay

## 2023-08-18 DIAGNOSIS — D75839 Thrombocytosis, unspecified: Secondary | ICD-10-CM

## 2023-08-18 NOTE — Assessment & Plan Note (Deleted)
 Lab review: 10/09/2021: Platelets 496 04/18/2022: Platelets 572 07/29/2022: Platelets 706 08/20/22: Platelets 605, Iron studies: Sat: 20%, Ferritin 36, Cr: 1.18, MPN Panel: CALR mutation 11/12/2022: Platelets 781 12/03/2022: Platelets 784 06/05/2023: Platelets 1075 06/17/2023: Platelets 854 07/18/2023: Platelets 513   Recommendation: Currently on Hydrea 500 mg daily  Recheck labs in 3 months and follow-up

## 2023-08-19 ENCOUNTER — Inpatient Hospital Stay: Payer: Medicare Other | Attending: Hematology and Oncology

## 2023-08-19 ENCOUNTER — Inpatient Hospital Stay: Payer: Medicare Other | Admitting: Hematology and Oncology

## 2023-08-19 DIAGNOSIS — D75839 Thrombocytosis, unspecified: Secondary | ICD-10-CM

## 2023-08-28 ENCOUNTER — Ambulatory Visit: Payer: Medicare Other | Admitting: Nurse Practitioner

## 2023-09-02 ENCOUNTER — Telehealth: Payer: Self-pay | Admitting: Hematology and Oncology

## 2023-09-02 NOTE — Telephone Encounter (Signed)
Rescheduled appointments per inclement weather. Patients son will make the patient aware of the changes made.

## 2023-09-03 ENCOUNTER — Inpatient Hospital Stay: Payer: Medicare Other

## 2023-09-03 ENCOUNTER — Inpatient Hospital Stay: Payer: Medicare Other | Admitting: Hematology and Oncology

## 2023-09-16 ENCOUNTER — Inpatient Hospital Stay (HOSPITAL_BASED_OUTPATIENT_CLINIC_OR_DEPARTMENT_OTHER): Payer: Medicare Other | Admitting: Hematology and Oncology

## 2023-09-16 ENCOUNTER — Inpatient Hospital Stay: Payer: Medicare Other | Attending: Hematology and Oncology

## 2023-09-16 VITALS — BP 135/80 | HR 75 | Temp 98.2°F | Resp 17 | Wt 99.5 lb

## 2023-09-16 DIAGNOSIS — D75839 Thrombocytosis, unspecified: Secondary | ICD-10-CM | POA: Diagnosis not present

## 2023-09-16 DIAGNOSIS — Z79899 Other long term (current) drug therapy: Secondary | ICD-10-CM | POA: Insufficient documentation

## 2023-09-16 DIAGNOSIS — D473 Essential (hemorrhagic) thrombocythemia: Secondary | ICD-10-CM | POA: Insufficient documentation

## 2023-09-16 DIAGNOSIS — Z7901 Long term (current) use of anticoagulants: Secondary | ICD-10-CM | POA: Diagnosis not present

## 2023-09-16 LAB — CBC WITH DIFFERENTIAL (CANCER CENTER ONLY)
Abs Immature Granulocytes: 0.01 10*3/uL (ref 0.00–0.07)
Basophils Absolute: 0.1 10*3/uL (ref 0.0–0.1)
Basophils Relative: 1 %
Eosinophils Absolute: 0.1 10*3/uL (ref 0.0–0.5)
Eosinophils Relative: 1 %
HCT: 33.8 % — ABNORMAL LOW (ref 36.0–46.0)
Hemoglobin: 11.3 g/dL — ABNORMAL LOW (ref 12.0–15.0)
Immature Granulocytes: 0 %
Lymphocytes Relative: 11 %
Lymphs Abs: 0.7 10*3/uL (ref 0.7–4.0)
MCH: 34.1 pg — ABNORMAL HIGH (ref 26.0–34.0)
MCHC: 33.4 g/dL (ref 30.0–36.0)
MCV: 102.1 fL — ABNORMAL HIGH (ref 80.0–100.0)
Monocytes Absolute: 0.6 10*3/uL (ref 0.1–1.0)
Monocytes Relative: 9 %
Neutro Abs: 5 10*3/uL (ref 1.7–7.7)
Neutrophils Relative %: 78 %
Platelet Count: 258 10*3/uL (ref 150–400)
RBC: 3.31 MIL/uL — ABNORMAL LOW (ref 3.87–5.11)
RDW: 17 % — ABNORMAL HIGH (ref 11.5–15.5)
WBC Count: 6.4 10*3/uL (ref 4.0–10.5)
nRBC: 0 % (ref 0.0–0.2)

## 2023-09-16 NOTE — Assessment & Plan Note (Signed)
 Lab review: 10/09/2021: Platelets 496 04/18/2022: Platelets 572 07/29/2022: Platelets 706 08/20/22: Platelets 605, Iron studies: Sat: 20%, Ferritin 36, Cr: 1.18, MPN Panel: CALR mutation 11/12/2022: Platelets 781 12/03/2022: Platelets 784 06/05/2023: Platelets 1075 06/17/2023: Platelets 854 07/18/2023: Platelets 513   Recommendation: Currently on Hydrea 500 mg daily   Recheck labs in 2 month and follow-up

## 2023-09-16 NOTE — Progress Notes (Signed)
 Patient Care Team: Lucky Cowboy, MD as PCP - General (Internal Medicine) Jake Bathe, MD as PCP - Cardiology (Cardiology) Charlett Nose, [facility] (Inactive) as Pharmacist (Pharmacist) Sundra Aland, [facility] (Inactive) as Pharmacist (Pharmacist)  DIAGNOSIS:  Encounter Diagnosis  Name Primary?   Thrombocytosis Yes    CHIEF COMPLIANT: Follow-up essential thrombocytosis on Hydrea  HISTORY OF PRESENT ILLNESS:  History of Present Illness The patient, with a history of thrombocytosis, has been on Hydrea therapy. She reports significant improvement in her condition, with her platelet count dropping from over a thousand to two hundred and fifty-eight, now within the normal range. The patient has been compliant with the medication regimen and reports no issues with taking the medication. The patient's condition has been monitored regularly, with checks every two months. However, due to the significant improvement, the doctor suggests extending the check-up interval to three months.     ALLERGIES:  is allergic to advil [ibuprofen], atenolol, and adhesive [tape].  MEDICATIONS:  Current Outpatient Medications  Medication Sig Dispense Refill   AREXVY 120 MCG/0.5ML injection 0.5 mLs.     atorvastatin (LIPITOR) 20 MG tablet TAKE 1 TABLET DAILY FOR CHOLESTEROL 90 tablet 3   Cholecalciferol (VITAMIN D3) 25 MCG (1000 UT) CAPS Take 3 capsules  (3,000 units) Daily (Patient taking differently: Take 1,000 Units by mouth daily.) 1 capsule 0   ELIQUIS 2.5 MG TABS tablet TAKE 1 TABLET TWICE A DAY 180 tablet 3   hydroxyurea (HYDREA) 500 MG capsule Take 1 capsule (500 mg total) by mouth daily. May take with food to minimize GI side effects. 30 capsule 3   latanoprost (XALATAN) 0.005 % ophthalmic solution Place 1 drop into both eyes at bedtime.     metoprolol succinate (TOPROL-XL) 25 MG 24 hr tablet TAKE 1 TABLET DAILY FOR HEART AND BLOOD PRESSURE 90 tablet 3   QUEtiapine (SEROQUEL XR) 50 MG TB24 24 hr  tablet TAKE 1 TABLET DAILY AT LUNCHTIME FOR MEMORY AND CONCENTRATION 90 tablet 3   No current facility-administered medications for this visit.    PHYSICAL EXAMINATION: ECOG PERFORMANCE STATUS: 1 - Symptomatic but completely ambulatory  Vitals:   09/16/23 1512  BP: 135/80  Pulse: 75  Resp: 17  Temp: 98.2 F (36.8 C)  SpO2: 100%   Filed Weights   09/16/23 1512  Weight: 99 lb 8 oz (45.1 kg)      LABORATORY DATA:  I have reviewed the data as listed    Latest Ref Rng & Units 07/15/2023   10:54 AM 02/27/2023    1:15 AM 02/26/2023    9:02 AM  CMP  Glucose 65 - 99 mg/dL 409  70  85   BUN 7 - 25 mg/dL 38  21  25   Creatinine 0.60 - 0.95 mg/dL 8.11  9.14  7.82   Sodium 135 - 146 mmol/L 144  136  139   Potassium 3.5 - 5.3 mmol/L 5.2  3.4  4.0   Chloride 98 - 110 mmol/L 107  106  104   CO2 20 - 32 mmol/L 29  23  26    Calcium 8.6 - 10.4 mg/dL 95.6  7.9  8.3   Total Protein 6.1 - 8.1 g/dL 6.8  4.2  4.9   Total Bilirubin 0.2 - 1.2 mg/dL 0.4  1.5  1.8   Alkaline Phos 38 - 126 U/L  37  47   AST 10 - 35 U/L 16  11  13    ALT 6 - 29 U/L 15  10  10     Lab Results  Component Value Date   WBC 6.4 09/16/2023   HGB 11.3 (L) 09/16/2023   HCT 33.8 (L) 09/16/2023   MCV 102.1 (H) 09/16/2023   PLT 258 09/16/2023   NEUTROABS 5.0 09/16/2023    ASSESSMENT & PLAN:  Thrombocytosis Lab review: 10/09/2021: Platelets 496 04/18/2022: Platelets 572 07/29/2022: Platelets 706 08/20/22: Platelets 605, Iron studies: Sat: 20%, Ferritin 36, Cr: 1.18, MPN Panel: CALR mutation 11/12/2022: Platelets 781 12/03/2022: Platelets 784 06/05/2023: Platelets 1075 06/17/2023: Platelets 854 07/18/2023: Platelets 513 09/16/2023: Platelets 258    Recommendation: Currently on Hydrea 500 mg daily (Monday through Friday, off on Saturday and Sunday starting 09/16/2023)   Recheck labs in 3 month and follow-up     Orders Placed This Encounter  Procedures   CBC with Differential (Cancer Center Only)    Standing  Status:   Future    Expiration Date:   09/15/2024   The patient has a good understanding of the overall plan. she agrees with it. she will call with any problems that may develop before the next visit here. Total time spent: 30 mins including face to face time and time spent for planning, charting and co-ordination of care   Tamsen Meek, MD 09/16/23

## 2023-10-01 ENCOUNTER — Ambulatory Visit: Payer: PRIVATE HEALTH INSURANCE | Admitting: Cardiology

## 2023-10-13 ENCOUNTER — Other Ambulatory Visit: Payer: Self-pay | Admitting: Hematology and Oncology

## 2023-10-16 ENCOUNTER — Ambulatory Visit: Payer: PRIVATE HEALTH INSURANCE | Admitting: Cardiology

## 2023-10-23 ENCOUNTER — Ambulatory Visit (INDEPENDENT_AMBULATORY_CARE_PROVIDER_SITE_OTHER): Admitting: Cardiology

## 2023-10-23 VITALS — BP 116/74 | HR 45 | Ht 62.0 in | Wt 99.2 lb

## 2023-10-23 DIAGNOSIS — I447 Left bundle-branch block, unspecified: Secondary | ICD-10-CM

## 2023-10-23 DIAGNOSIS — I5042 Chronic combined systolic (congestive) and diastolic (congestive) heart failure: Secondary | ICD-10-CM | POA: Diagnosis not present

## 2023-10-23 NOTE — Progress Notes (Signed)
 Cardiology Office Note:  .   Date:  10/23/2023  ID:  Leslie [name], DOB 1927/04/04, MRN [id number] PCP: Lucky Cowboy, MD  [facility] Providers Cardiologist:  Donato Schultz, MD    History of Present Illness: .   Leslie [name] is a 89 y.o. female Discussed the use of AI scribe software for clinical note transcription with the patient, who gave verbal consent to proceed.  History of Present Illness [name] is a 89 year old female with chronic systolic heart failure who presents for follow-up.  She is here for a follow-up of her chronic systolic heart failure with an ejection fraction of 25%. No issues with breathing, and she is able to get around without having to stop or experiencing shortness of breath. She is currently taking metoprolol 25 mg XL daily and spironolactone 25 mg daily.  She has a history of atrial fibrillation and is on Eliquis 2.5 mg twice daily for stroke prevention. She has not experienced any recent episodes of atrial fibrillation.  She has a history of hypertension and hyperlipidemia. For hyperlipidemia, she is on atorvastatin 20 mg daily. Her prior LDL was 49, and her recent LDL is 62. She mentions that her cholesterol issues are hereditary.  She was hospitalized in August 2024 for intractable diarrhea. She also had a fall in 2024 that led her to the emergency department.  She is active, walking the halls using her walker for exercise. She resides in a care facility and is the oldest and fastest resident there. She quit smoking 31 years ago after smoking for 30 years.  Lives at [facility].        Studies Reviewed: .        Results LABS LDL: 49 (02/2023) Hb: 9.3 (02/2023) Cr: 0.95 (02/2023) Hb: 11.3 (09/2023) Cr: 1.1 (09/2023) A1c: 5.7 (09/2023) TSH: 2.9 (09/2023) LDL: 62 (09/2023)  DIAGNOSTIC ECG: Normal sinus rhythm (02/2023) Echocardiogram: EF 25% (2022) Risk Assessment/Calculations:            Physical Exam:   VS:  BP  116/74   Pulse (!) 45   Ht 5\' 2"  (1.575 m)   Wt 99 lb 3.2 oz (45 kg)   SpO2 97%   BMI 18.14 kg/m    Wt Readings from Last 3 Encounters:  10/23/23 99 lb 3.2 oz (45 kg)  09/16/23 99 lb 8 oz (45.1 kg)  07/18/23 98 lb 12.8 oz (44.8 kg)    GEN: Walker Well nourished, well developed in no acute distress NECK: No JVD; No carotid bruits CARDIAC: RRR, no murmurs, no rubs, no gallops RESPIRATORY:  Clear to auscultation without rales, wheezing or rhonchi  ABDOMEN: Soft, non-tender, non-distended EXTREMITIES:  No edema; No deformity   ASSESSMENT AND PLAN: .    Assessment and Plan Assessment & Plan Heart Failure with Reduced Ejection Fraction (HFrEF) Chronic systolic heart failure with an ejection fraction of 25% since 2022. She is NYHA Class I, asymptomatic with no dyspnea or fatigue. Current management with metoprolol and spironolactone is effective. A repeat echocardiogram is unnecessary as it would not alter management. - Continue metoprolol 25 mg XL daily - Continue spironolactone 25 mg daily  Atrial Fibrillation and Stroke Prevention Atrial fibrillation managed with Eliquis 2.5 mg BID for stroke prevention. She has no recent episodes of atrial fibrillation. The dose is adjusted for age and weight, ensuring safety and efficacy. - Continue Eliquis 2.5 mg BID  Hypertension Hypertension managed with metoprolol and spironolactone, contributing to blood pressure  control. - Continue current antihypertensive regimen  Hyperlipidemia Managed with atorvastatin 20 mg daily. Recent LDL was 62, indicating good control. Reassurance provided regarding statin safety and efficacy, despite online concerns. Statins reduce cardiovascular risk. - Continue atorvastatin 20 mg daily  Renal Function Renal function is stable with a creatinine level of 1.1. No changes in management are necessary.  Follow-up She is well-managed with no new symptoms or changes in condition. Current management is effective. -  Continue current management and monitoring          Signed, Donato Schultz, MD

## 2023-10-23 NOTE — Patient Instructions (Signed)
 Medication Instructions:  Your physician recommends that you continue on your current medications as directed. Please refer to the Current Medication list given to you today.  *If you need a refill on your cardiac medications before your next appointment, please call your pharmacy*  Lab Work: NONE  Testing/Procedures: NONE  Follow-Up: At [facility], you and your health needs are our priority.  As part of our continuing mission to provide you with exceptional heart care, we have created designated Provider Care Teams.  These Care Teams include your primary Cardiologist (physician) and Advanced Practice Providers (APPs -  Physician Assistants and Nurse Practitioners) who all work together to provide you with the care you need, when you need it.  We recommend signing up for the patient portal called "MyChart".  Sign up information is provided on this After Visit Summary.  MyChart is used to connect with patients for Virtual Visits (Telemedicine).  Patients are able to view lab/test results, encounter notes, upcoming appointments, etc.  Non-urgent messages can be sent to your provider as well.   To learn more about what you can do with MyChart, go to [email or web address].    Your next appointment:   12 MONTHS    The format for your next appointment:   In Person  Provider:   DR Anne Fu

## 2023-10-28 ENCOUNTER — Ambulatory Visit: Payer: Medicare Other | Admitting: Nurse Practitioner

## 2023-11-11 ENCOUNTER — Other Ambulatory Visit: Payer: Self-pay | Admitting: *Deleted

## 2023-11-11 MED ORDER — HYDROXYUREA 500 MG PO CAPS: 500.0000 mg | ORAL_CAPSULE | Freq: Every day | ORAL | 3 refills | Status: DC

## 2023-12-01 ENCOUNTER — Telehealth: Payer: Self-pay | Admitting: Hematology and Oncology

## 2023-12-01 NOTE — Telephone Encounter (Signed)
 Confirmed with pt's son about scheduled appt time and change of provider

## 2023-12-30 ENCOUNTER — Inpatient Hospital Stay (HOSPITAL_BASED_OUTPATIENT_CLINIC_OR_DEPARTMENT_OTHER): Admitting: Adult Health

## 2023-12-30 ENCOUNTER — Encounter: Payer: Self-pay | Admitting: Adult Health

## 2023-12-30 ENCOUNTER — Inpatient Hospital Stay: Attending: Adult Health

## 2023-12-30 VITALS — BP 118/68 | HR 87 | Temp 97.7°F | Resp 17 | Ht 62.0 in | Wt 107.9 lb

## 2023-12-30 DIAGNOSIS — D75839 Thrombocytosis, unspecified: Secondary | ICD-10-CM

## 2023-12-30 DIAGNOSIS — D473 Essential (hemorrhagic) thrombocythemia: Secondary | ICD-10-CM

## 2023-12-30 DIAGNOSIS — Z79899 Other long term (current) drug therapy: Secondary | ICD-10-CM | POA: Insufficient documentation

## 2023-12-30 DIAGNOSIS — Z87891 Personal history of nicotine dependence: Secondary | ICD-10-CM | POA: Diagnosis not present

## 2023-12-30 LAB — CBC WITH DIFFERENTIAL (CANCER CENTER ONLY)
Abs Immature Granulocytes: 0.02 10*3/uL (ref 0.00–0.07)
Basophils Absolute: 0 10*3/uL (ref 0.0–0.1)
Basophils Relative: 1 %
Eosinophils Absolute: 0.1 10*3/uL (ref 0.0–0.5)
Eosinophils Relative: 1 %
HCT: 32.6 % — ABNORMAL LOW (ref 36.0–46.0)
Hemoglobin: 11.2 g/dL — ABNORMAL LOW (ref 12.0–15.0)
Immature Granulocytes: 0 %
Lymphocytes Relative: 9 %
Lymphs Abs: 0.6 10*3/uL — ABNORMAL LOW (ref 0.7–4.0)
MCH: 36.8 pg — ABNORMAL HIGH (ref 26.0–34.0)
MCHC: 34.4 g/dL (ref 30.0–36.0)
MCV: 107.2 fL — ABNORMAL HIGH (ref 80.0–100.0)
Monocytes Absolute: 0.6 10*3/uL (ref 0.1–1.0)
Monocytes Relative: 9 %
Neutro Abs: 5.2 10*3/uL (ref 1.7–7.7)
Neutrophils Relative %: 80 %
Platelet Count: 269 10*3/uL (ref 150–400)
RBC: 3.04 MIL/uL — ABNORMAL LOW (ref 3.87–5.11)
RDW: 16.2 % — ABNORMAL HIGH (ref 11.5–15.5)
WBC Count: 6.5 10*3/uL (ref 4.0–10.5)
nRBC: 0 % (ref 0.0–0.2)

## 2023-12-30 NOTE — Progress Notes (Signed)
 [facility] Cancer Follow up:    Vangie Genet, MD [address] [place] [address]   DIAGNOSIS: Essential Thrombocytosis   SUMMARY OF HEMATOLOGIC HISTORY: 10/09/2021: Platelets 496 04/18/2022: Platelets 572 07/29/2022: Platelets 706 08/20/22: Platelets 605, Iron studies: Sat: 20%, Ferritin 36, Cr: 1.18, MPN Panel: CALR mutation 11/12/2022: Platelets 781 12/03/2022: Platelets 784 06/05/2023: Platelets 1075 06/17/2023: Platelets 854 07/18/2023: Platelets 513 09/16/2023: Platelets 258 12/30/2023 Platelets 269   CURRENT THERAPY: Hydrea  500mg  daily  INTERVAL HISTORY:  Discussed the use of AI scribe software for clinical note transcription with the patient, who gave verbal consent to proceed.  History of Present Illness [name] is a 89 year old female with essential thrombocytosis who presents for follow-up and evaluation. She is accompanied by her son.  She continues Hydrea  500 mg daily for essential thrombocytosis. Her platelet count has decreased from over 1000 to 269, showing a positive response to treatment. She experiences no side effects such as nausea, vomiting, or changes in stool, and has no headaches or vision changes.  Her WBC and hemoglobin have remained stable.       Patient Active Problem List   Diagnosis Date Noted   Intractable diarrhea 02/26/2023   Aortic atherosclerosis (HCC) - CT 09/2020 10/11/2020   Pulmonary emphysema (HCC) - per CT 09/2020 10/11/2020   Slow transit constipation    Chronic combined systolic and diastolic CHF (congestive heart failure) (HCC)    Chronic low back pain without sciatica    History of embolic stroke 10/01/2020   DNR no code (do not resuscitate) 10/01/2020   Thrombocytosis 10/01/2020   Body mass index (BMI) of 19.0-19.9 in adult 05/15/2020   CKD (chronic kidney disease) stage 3, GFR 30-59 ml/min (HCC) 05/15/2020   Labile hypertension 06/16/2018   FHx: heart disease 06/16/2018   Former  smoker 06/16/2018   Abnormal glucose 06/16/2018   Anxiety 03/04/2018   Medication management 04/08/2014   Vitamin D  deficiency 07/01/2013   Hyperlipidemia, mixed 07/01/2013   LBBB (left bundle branch block) 02/20/2010    is allergic to advil [ibuprofen], atenolol, and adhesive [tape].  MEDICAL HISTORY: Past Medical History:  Diagnosis Date   Anxiety disorder    Cardiomyopathy (HCC)    Glaucoma    Hearing loss    HTN (hypertension)    Hyperglycemia    Hyperlipidemia    LBBB (left bundle branch block)    Stroke (cerebrum) (HCC)    Vitamin D  deficiency     SURGICAL HISTORY: Past Surgical History:  Procedure Laterality Date   GLAUCOMA SURGERY  2013   TONSILLECTOMY      SOCIAL HISTORY: Social History   Socioeconomic History   Marital status: Married    Spouse name: Not on file   Number of children: Not on file   Years of education: Not on file   Highest education level: Not on file  Occupational History   Not on file  Tobacco Use   Smoking status: Former    Current packs/day: 0.00    Types: Cigarettes    Quit date: 07/16/1979    Years since quitting: 44.4   Smokeless tobacco: Never  Vaping Use   Vaping status: Never Used  Substance and Sexual Activity   Alcohol use: No    Alcohol/week: 0.0 standard drinks of alcohol   Drug use: No   Sexual activity: Not Currently  Other Topics Concern   Not on file  Social History Narrative   Not on file  Social Drivers of Corporate investment banker Strain: Not on file  Food Insecurity: No Food Insecurity (02/26/2023)   Hunger Vital Sign    Worried About Running Out of Food in the Last Year: Never true    Ran Out of Food in the Last Year: Never true  Transportation Needs: No Transportation Needs (02/26/2023)   PRAPARE - Administrator, Civil Service (Medical): No    Lack of Transportation (Non-Medical): No  Physical Activity: Not on file  Stress: Not on file  Social Connections: Not on file  Intimate  Partner Violence: Not At Risk (02/26/2023)   Humiliation, Afraid, Rape, and Kick questionnaire    Fear of Current or Ex-Partner: No    Emotionally Abused: No    Physically Abused: No    Sexually Abused: No    FAMILY HISTORY: Family History  Problem Relation Age of Onset   Heart attack Brother    Heart disease Brother    Heart attack Brother    Heart disease Brother    Hypertension Mother    Stroke Mother    Heart attack Father    Diabetes Father     Review of Systems  Constitutional:  Negative for appetite change, chills, fatigue, fever and unexpected weight change.  HENT:   Negative for hearing loss, lump/mass and trouble swallowing.   Eyes:  Negative for eye problems and icterus.  Respiratory:  Negative for chest tightness, cough and shortness of breath.   Cardiovascular:  Negative for chest pain, leg swelling and palpitations.  Gastrointestinal:  Negative for abdominal distention, abdominal pain, constipation, diarrhea, nausea and vomiting.  Endocrine: Negative for hot flashes.  Genitourinary:  Negative for difficulty urinating.   Musculoskeletal:  Negative for arthralgias.  Skin:  Negative for itching and rash.  Neurological:  Negative for dizziness, extremity weakness, headaches and numbness.  Hematological:  Negative for adenopathy. Does not bruise/bleed easily.  Psychiatric/Behavioral:  Negative for depression. The patient is not nervous/anxious.       PHYSICAL EXAMINATION    Vitals:   12/30/23 1040  BP: 118/68  Pulse: 87  Resp: 17  Temp: 97.7 F (36.5 C)  SpO2: 100%    Physical Exam Constitutional:      General: She is not in acute distress.    Appearance: Normal appearance. She is not toxic-appearing.  HENT:     Head: Normocephalic and atraumatic.   Eyes:     General: No scleral icterus.  Abdominal:     General: Bowel sounds are normal.   Musculoskeletal:        General: No swelling.   Skin:    General: Skin is warm and dry.     Findings:  No rash.   Neurological:     General: No focal deficit present.     Mental Status: She is alert.   Psychiatric:        Mood and Affect: Mood normal.        Behavior: Behavior normal.     LABORATORY DATA:  CBC    Component Value Date/Time   WBC 6.5 12/30/2023 1028   WBC 8.4 07/15/2023 1054   RBC 3.04 (L) 12/30/2023 1028   HGB 11.2 (L) 12/30/2023 1028   HGB 13.5 11/29/2020 0941   HCT 32.6 (L) 12/30/2023 1028   HCT 40.6 11/29/2020 0941   PLT 269 12/30/2023 1028   PLT 402 11/29/2020 0941   MCV 107.2 (H) 12/30/2023 1028   MCV 89 11/29/2020 0941   MCH 36.8 (  H) 12/30/2023 1028   MCHC 34.4 12/30/2023 1028   RDW 16.2 (H) 12/30/2023 1028   RDW 15.5 (H) 11/29/2020 0941   LYMPHSABS 0.6 (L) 12/30/2023 1028   MONOABS 0.6 12/30/2023 1028   EOSABS 0.1 12/30/2023 1028   BASOSABS 0.0 12/30/2023 1028    CMP     Component Value Date/Time   NA 144 07/15/2023 1054   NA 138 11/29/2020 0941   K 5.2 07/15/2023 1054   CL 107 07/15/2023 1054   CO2 29 07/15/2023 1054   GLUCOSE 110 (H) 07/15/2023 1054   BUN 38 (H) 07/15/2023 1054   BUN 18 11/29/2020 0941   CREATININE 1.14 (H) 07/15/2023 1054   CALCIUM  10.3 07/15/2023 1054   PROT 6.8 07/15/2023 1054   PROT 6.7 01/10/2021 0827   ALBUMIN 1.8 (L) 02/27/2023 0115   ALBUMIN 4.2 01/10/2021 0827   AST 16 07/15/2023 1054   AST 20 08/20/2022 1335   ALT 15 07/15/2023 1054   ALT 12 08/20/2022 1335   ALKPHOS 37 (L) 02/27/2023 0115   BILITOT 0.4 07/15/2023 1054   BILITOT 0.6 08/20/2022 1335   GFRNONAA 56 (L) 02/27/2023 0115   GFRNONAA 43 (L) 08/20/2022 1335   GFRNONAA 53 (L) 01/08/2021 1027   GFRAA 62 01/08/2021 1027     ASSESSMENT and THERAPY PLAN:   Assessment & Plan Essential thrombocytosis Essential thrombocytosis well-managed with Hydrea . Platelet count improved to 269. No medication side effects. Stable white blood cell count and hemoglobin. - Continue Hydrea  500 mg daily. - Order follow-up labs in three months. - Schedule  follow-up appointment in three months.      All questions were answered. The patient knows to call the clinic with any problems, questions or concerns. We can certainly see the patient much sooner if necessary.  Total encounter time:15 minutes*in face-to-face visit time, chart review, lab review, care coordination, order entry, and documentation of the encounter time.    Alwin Baars, NP 12/30/23 10:41 AM Medical Oncology and Hematology [facility] [address] [place], [place] [address] Tel. [phone]    Fax. [phone]  *Total Encounter Time as defined by the Centers for Medicare and Medicaid Services includes, in addition to the face-to-face time of a patient visit (documented in the note above) non-face-to-face time: obtaining and reviewing outside history, ordering and reviewing medications, tests or procedures, care coordination (communications with other health care professionals or caregivers) and documentation in the medical record.

## 2024-01-23 ENCOUNTER — Telehealth: Payer: Self-pay | Admitting: *Deleted

## 2024-01-23 MED ORDER — HYDROXYUREA 500 MG PO CAPS: 500.0000 mg | ORAL_CAPSULE | Freq: Every day | ORAL | 3 refills | Status: DC

## 2024-01-23 NOTE — Telephone Encounter (Signed)
 Received call from pt son requesting refill for Hydrea .  Prescription sent to pharmacy on file.

## 2024-01-27 ENCOUNTER — Ambulatory Visit: Payer: Medicare Other | Admitting: Internal Medicine

## 2024-03-09 ENCOUNTER — Emergency Department (HOSPITAL_COMMUNITY)

## 2024-03-09 ENCOUNTER — Emergency Department (HOSPITAL_COMMUNITY)
Admission: EM | Admit: 2024-03-09 | Discharge: 2024-03-09 | Disposition: A | Discharge status: Home / Self Care | Attending: Emergency Medicine | Admitting: Emergency Medicine

## 2024-03-09 ENCOUNTER — Encounter (HOSPITAL_COMMUNITY): Payer: Self-pay | Admitting: Emergency Medicine

## 2024-03-09 DIAGNOSIS — I110 Hypertensive heart disease with heart failure: Secondary | ICD-10-CM | POA: Diagnosis not present

## 2024-03-09 DIAGNOSIS — Z79899 Other long term (current) drug therapy: Secondary | ICD-10-CM | POA: Diagnosis not present

## 2024-03-09 DIAGNOSIS — I509 Heart failure, unspecified: Secondary | ICD-10-CM | POA: Diagnosis not present

## 2024-03-09 DIAGNOSIS — S0990XA Unspecified injury of head, initial encounter: Secondary | ICD-10-CM | POA: Insufficient documentation

## 2024-03-09 DIAGNOSIS — Z7901 Long term (current) use of anticoagulants: Secondary | ICD-10-CM | POA: Diagnosis not present

## 2024-03-09 DIAGNOSIS — S32592A Other specified fracture of left pubis, initial encounter for closed fracture: Secondary | ICD-10-CM | POA: Insufficient documentation

## 2024-03-09 DIAGNOSIS — W1800XA Striking against unspecified object with subsequent fall, initial encounter: Secondary | ICD-10-CM

## 2024-03-09 DIAGNOSIS — W01198A Fall on same level from slipping, tripping and stumbling with subsequent striking against other object, initial encounter: Secondary | ICD-10-CM | POA: Insufficient documentation

## 2024-03-09 DIAGNOSIS — S79912A Unspecified injury of left hip, initial encounter: Secondary | ICD-10-CM | POA: Diagnosis present

## 2024-03-09 DIAGNOSIS — W19XXXA Unspecified fall, initial encounter: Secondary | ICD-10-CM

## 2024-03-09 DIAGNOSIS — N189 Chronic kidney disease, unspecified: Secondary | ICD-10-CM | POA: Insufficient documentation

## 2024-03-09 LAB — BASIC METABOLIC PANEL WITH GFR
Anion gap: 12 (ref 5–15)
BUN: 19 mg/dL (ref 8–23)
CO2: 21 mmol/L — ABNORMAL LOW (ref 22–32)
Calcium: 9.5 mg/dL (ref 8.9–10.3)
Chloride: 97 mmol/L — ABNORMAL LOW (ref 98–111)
Creatinine, Ser: 0.98 mg/dL (ref 0.44–1.00)
GFR, Estimated: 53 mL/min — ABNORMAL LOW (ref >60)
Glucose, Bld: 98 mg/dL (ref 70–99)
Potassium: 4.3 mmol/L (ref 3.5–5.1)
Sodium: 130 mmol/L — ABNORMAL LOW (ref 135–145)

## 2024-03-09 LAB — CBC WITH DIFFERENTIAL/PLATELET
Abs Granulocyte: 5.6 K/uL (ref 1.5–6.5)
Abs Immature Granulocytes: 0.06 K/uL (ref 0.00–0.07)
Basophils Absolute: 0.1 K/uL (ref 0.0–0.1)
Basophils Relative: 1 %
Eosinophils Absolute: 0.2 K/uL (ref 0.0–0.5)
Eosinophils Relative: 2 %
HCT: 34.7 % — ABNORMAL LOW (ref 36.0–46.0)
Hemoglobin: 11.6 g/dL — ABNORMAL LOW (ref 12.0–15.0)
Immature Granulocytes: 1 %
Lymphocytes Relative: 11 %
Lymphs Abs: 0.8 K/uL (ref 0.7–4.0)
MCH: 37.2 pg — ABNORMAL HIGH (ref 26.0–34.0)
MCHC: 33.4 g/dL (ref 30.0–36.0)
MCV: 111.2 fL — ABNORMAL HIGH (ref 80.0–100.0)
Monocytes Absolute: 0.8 K/uL (ref 0.1–1.0)
Monocytes Relative: 11 %
Neutro Abs: 5.6 K/uL (ref 1.7–7.7)
Neutrophils Relative %: 74 %
Platelets: 271 K/uL (ref 150–400)
RBC: 3.12 MIL/uL — ABNORMAL LOW (ref 3.87–5.11)
RDW: 14.3 % (ref 11.5–15.5)
Smear Review: NORMAL
WBC: 7.4 K/uL (ref 4.0–10.5)
nRBC: 0 % (ref 0.0–0.2)

## 2024-03-09 LAB — BRAIN NATRIURETIC PEPTIDE: B Natriuretic Peptide: 242.5 pg/mL — ABNORMAL HIGH (ref 0.0–100.0)

## 2024-03-09 LAB — TROPONIN I (HIGH SENSITIVITY): Troponin I (High Sensitivity): 13 ng/L (ref <18)

## 2024-03-09 NOTE — ED Provider Notes (Signed)
 [place] EMERGENCY DEPARTMENT AT [facility] Provider Note   CSN: [id number] Arrival date & time: 03/09/24  1547     Patient presents with: Trauma   Leslie [name] is a 89 y.o. female.   HPI   89 year old female with medical history significant for left bundle branch block, anxiety, hypertension, CKD, CVA, DNR in place, CHF presenting as a level 2 fall on anticoagulation (Eliquis ).  The patient had a ground-level mechanical fall, fell and hit her head, denies any obvious trauma or injury.  She arrives to the emergency department without complaint, at her baseline mental status, GCS 15, ABC intact.   Prior to Admission medications   Medication Sig Start Date End Date Taking? Authorizing Provider  AREXVY 120 MCG/0.5ML injection 0.5 mLs. Patient not taking: Reported on 10/23/2023 04/01/23   [provider]  atorvastatin  (LIPITOR) 20 MG tablet TAKE 1 TABLET DAILY FOR CHOLESTEROL 05/30/23   Wilkinson, Dana E, NP  Cholecalciferol  (VITAMIN D3) 25 MCG (1000 UT) CAPS Take 3 capsules  (3,000 units) Daily Patient taking differently: Take 1,000 Units by mouth daily. 05/17/21   Tonita Fallow, MD  ELIQUIS  2.5 MG TABS tablet TAKE 1 TABLET TWICE A DAY 05/28/22   Wilkinson, Dana E, NP  hydroxyurea  (HYDREA ) 500 MG capsule Take 1 capsule (500 mg total) by mouth daily. May take with food to minimize GI side effects. 01/23/24   Gudena, Vinay, MD  latanoprost  (XALATAN ) 0.005 % ophthalmic solution Place 1 drop into both eyes at bedtime. 10/21/22   [provider]  metoprolol  succinate (TOPROL -XL) 25 MG 24 hr tablet TAKE 1 TABLET DAILY FOR HEART AND BLOOD PRESSURE 05/30/23   Wilkinson, Dana E, NP  QUEtiapine  (SEROQUEL  XR) 50 MG TB24 24 hr tablet TAKE 1 TABLET DAILY AT LUNCHTIME FOR MEMORY AND CONCENTRATION 08/05/23   Tonita Fallow, MD  spironolactone (ALDACTONE) 25 MG tablet Take 25 mg by mouth daily.    [provider]    Allergies: Advil [ibuprofen], Atenolol, and  Adhesive [tape]    Review of Systems  All other systems reviewed and are negative.   Updated Vital Signs BP 137/81   Pulse 82   Temp (!) 97.5 F (36.4 C) (Oral)   Resp 18   Ht 5' 2 (1.575 m)   Wt 44.9 kg   SpO2 91%   BMI 18.11 kg/m   Physical Exam Vitals and nursing note reviewed.  Constitutional:      General: She is not in acute distress.    Appearance: She is well-developed.     Comments: GCS 15, ABC intact  HENT:     Head: Normocephalic and atraumatic.  Eyes:     Extraocular Movements: Extraocular movements intact.     Conjunctiva/sclera: Conjunctivae normal.     Pupils: Pupils are equal, round, and reactive to light.  Neck:     Comments: No midline tenderness to palpation of the cervical spine.  Range of motion intact Cardiovascular:     Rate and Rhythm: Normal rate and regular rhythm.  Pulmonary:     Effort: Pulmonary effort is normal. No respiratory distress.     Breath sounds: Normal breath sounds.  Chest:     Comments: Clavicles stable nontender to AP compression.  Chest wall stable and nontender to AP and lateral compression. Abdominal:     Palpations: Abdomen is soft.     Tenderness: There is no abdominal tenderness.     Comments: Pelvis stable to lateral compression  Musculoskeletal:  Cervical back: Neck supple.     Comments: No midline tenderness to palpation of the thoracic or lumbar spine.  Extremities atraumatic with intact range of motion  Skin:    General: Skin is warm and dry.  Neurological:     Mental Status: She is alert.     Comments: Cranial nerves II through XII grossly intact.  Moving all 4 extremities spontaneously.  Sensation grossly intact all 4 extremities     (all labs ordered are listed, but only abnormal results are displayed) Labs Reviewed  CBC WITH DIFFERENTIAL/PLATELET - Abnormal; Notable for the following components:      Result Value   RBC 3.12 (*)    Hemoglobin 11.6 (*)    HCT 34.7 (*)    MCV 111.2 (*)    MCH 37.2  (*)    All other components within normal limits  BASIC METABOLIC PANEL WITH GFR - Abnormal; Notable for the following components:   Sodium 130 (*)    Chloride 97 (*)    CO2 21 (*)    GFR, Estimated 53 (*)    All other components within normal limits  BRAIN NATRIURETIC PEPTIDE - Abnormal; Notable for the following components:   B Natriuretic Peptide 242.5 (*)    All other components within normal limits  TROPONIN I (HIGH SENSITIVITY)    EKG: EKG Interpretation Date/Time:  Tuesday March 09 2024 17:40:12 EDT Ventricular Rate:  86 PR Interval:  169 QRS Duration:  159 QT Interval:  417 QTC Calculation: 499 R Axis:   266  Text Interpretation: Sinus rhythm IVCD, consider atypical LBBB Confirmed by Jerrol Agent ([id number]) on 03/10/2024 11:38:06 AM  Radiology: CT PELVIS WO CONTRAST Result Date: 03/09/2024 CLINICAL DATA:  Hip trauma EXAM: CT PELVIS WITHOUT CONTRAST TECHNIQUE: Multidetector CT imaging of the pelvis was performed following the standard protocol without intravenous contrast. RADIATION DOSE REDUCTION: This exam was performed according to the departmental dose-optimization program which includes automated exposure control, adjustment of the mA and/or kV according to patient size and/or use of iterative reconstruction technique. COMPARISON:  Pelvic radiograph March 09, 2024. FINDINGS: Urinary Tract: Bladder is under distended otherwise unremarkable. Postsurgical changes posterior to the pubic bone. Bowel: Significantly distended rectosigmoid colon containing large amount of stool with wall thickening. No bowel obstruction. Vascular/Lymphatic: Atherosclerotic calcifications of aorta. No suspicious lymphadenopathy. Reproductive: Hysterectomy. No pelvic mass.Right labia major subcutaneous lesion measuring 11 mm likely a sebaceous cyst. Other: No free fluid. Musculoskeletal: Left inferior pubic ramus subacute fracture with callus formation. No hip dislocation. Multilevel degenerative changes  of the spine and SI joints. IMPRESSION: Subacute fracture of left inferior pubic ramus. Severe distension and large stool burden throughout the thick-walled rectosigmoid colon. Right labia major subcutaneous lesion likely benign sebaceous cyst. Electronically Signed   By: Duwaine Severs M.D.   On: 03/09/2024 18:49   DG Pelvis Portable Result Date: 03/09/2024 CLINICAL DATA:  fall EXAM: PORTABLE PELVIS 1-2 VIEWS COMPARISON:  CT abdomen pelvis 02/26/2023 FINDINGS: Cortical irregularity of the left inferior pubic rami likely an old healed fracture. There is no evidence of pelvic fracture or diastasis. No acute displaced fracture or dislocation of either hips. No pelvic bone lesions are seen. Lumbar spine degenerative changes. IMPRESSION: Cortical irregularity of the left inferior pubic rami possibly an old healed fracture. Correlate with point tenderness to palpation to evaluate for an acute fracture. Consider left hip radiograph. Electronically Signed   By: Morgane  Naveau M.D.   On: 03/09/2024 17:06   DG Chest Portable 1 View  Result Date: 03/09/2024 CLINICAL DATA:  fall EXAM: PORTABLE CHEST 1 VIEW COMPARISON:  Chest x-ray 10/26/2022 FINDINGS: The heart and mediastinal contours are unchanged. Atherosclerotic plaque. No focal consolidation. Chronic coarsened interstitial markings with likely superimposed mild pulmonary edema. Possible trace right pleural effusion. No pneumothorax. No acute osseous abnormality. IMPRESSION: Mild pulmonary edema with possible trace right pleural effusion. Electronically Signed   By: Morgane  Naveau M.D.   On: 03/09/2024 17:04   CT Cervical Spine Wo Contrast Result Date: 03/09/2024 CLINICAL DATA:  Fall EXAM: CT CERVICAL SPINE WITHOUT CONTRAST TECHNIQUE: Multidetector CT imaging of the cervical spine was performed without intravenous contrast. Multiplanar CT image reconstructions were also generated. RADIATION DOSE REDUCTION: This exam was performed according to the departmental  dose-optimization program which includes automated exposure control, adjustment of the mA and/or kV according to patient size and/or use of iterative reconstruction technique. COMPARISON:  None Available. FINDINGS: No significant alignment abnormality. The craniocervical junction is normal. The atlantoaxial articulation is normal. The dens is normal. There is no fracture identified. Other comments: There is spondylosis IMPRESSION: Cervical spondylosis.  No fracture Electronically Signed   By: Nancyann Burns M.D.   On: 03/09/2024 16:50   CT Head Wo Contrast Result Date: 03/09/2024 CLINICAL DATA:  Fall EXAM: CT HEAD WITHOUT CONTRAST TECHNIQUE: Contiguous axial images were obtained from the base of the skull through the vertex without intravenous contrast. RADIATION DOSE REDUCTION: This exam was performed according to the departmental dose-optimization program which includes automated exposure control, adjustment of the mA and/or kV according to patient size and/or use of iterative reconstruction technique. COMPARISON:  MRI October 01, 2020 FINDINGS: CT HEAD: There is a small old right frontal cortical infarct. There is no hemorrhage. No acute ischemic changes. No mass lesion. The ventricles are normal. Skull/sinuses/orbits: No significant abnormality. IMPRESSION: No hemorrhage or acute abnormality Electronically Signed   By: Nancyann Burns M.D.   On: 03/09/2024 16:48     Procedures   Medications Ordered in the ED - No data to display                                  Medical Decision Making Amount and/or Complexity of Data Reviewed Labs: ordered. Radiology: ordered.     89 year old female with medical history significant for left bundle branch block, anxiety, hypertension, CKD, CVA, DNR in place, CHF presenting as a level 2 fall on anticoagulation (Eliquis ).  The patient had a ground-level mechanical fall, fell and hit her head, denies any obvious trauma or injury.  She arrives to the emergency  department without complaint, at her baseline mental status, GCS 15, ABC intact.  TAKASHA VETERE is a 89 y.o. female who presents by EMS as a Level 2 Trauma patient after ground level mechanical fall as per above.  On arrival, vitals stable. Currently, she is awake, alert, and protecting her own airway and is hemodynamically stable.  Trauma imaging revealed (full reports in EMR): Portable CXR:  No evidence of pneumothorax or tracheal deviation IMPRESSION:  Mild pulmonary edema with possible trace right pleural effusion.   Portable Pelvis:  No evidence of acute hip fracture or malalignment FAST: Not performed, pt is hemodynamically stable CT scans: CT Head and Cervical Spine: IMPRESSION:  No hemorrhage or acute abnormality   IMPRESSION:  Cervical spondylosis.  No fracture    CT Pelvis: IMPRESSION:  Subacute fracture of left inferior pubic ramus.    Severe distension  and large stool burden throughout the thick-walled  rectosigmoid colon.    Right labia major subcutaneous lesion likely benign sebaceous cyst.    Advised family of findings of subacute fracture of the left inferior pubic ramus, patient without pain, able to ambulate without difficulty, stable fracture that is subacute.  Patient with distention and stool burden noted also on CT, patient states that her last bowel movement was this morning, denies any rectal pain or pressure, able to move her bowels, family declined offer at disimpaction.    Laboratory evaluation revealed BNP mildly elevated to 243, tachycardic troponin normal, CBC without a leukocytosis, mild anemia at 11.6, BMP with mild hyponatremia to 130.  Patient with mild pulmonary edema on chest x-ray however she has no significant dyspnea, was ambulated on pulse oximetry with no desaturations.  Patient had single documented pulse oximetry of 83% noted in the chart however has been saturating 100% on room air without difficulty, suspect spurious finding.  Considered  admission for observation versus discharge back to her facility.  Given patient's lack of respiratory distress, satisfactory pulse oximetry around the emergency department with no desaturations, clear lungs on exam, felt that patient was stable for discharge at this time with return precautions provided in the event of any worsening signs or symptoms of respiratory failure.       Final diagnoses:  Fall, initial encounter  Closed fracture of left inferior pubic ramus, initial encounter [facility])    ED Discharge Orders     None          Jerrol Agent, MD 03/10/24 1138

## 2024-03-09 NOTE — ED Notes (Signed)
 Patient ambulated with pulse ox in hallway with walker. Steady gait maintained HR in the 70s and O2 saturation 95-100%.

## 2024-03-09 NOTE — ED Notes (Signed)
 Trauma Response Nurse Documentation  Leslie [name] is a 89 y.o. female arriving to [facility] ED via EMS  On Eliquis  (apixaban ) daily. Trauma was activated as a Level 2 based on the following trauma criteria Elderly patients > 65 with head trauma on anti-coagulation (excluding ASA).  Patient cleared for CT by Dr. Jerrol. Pt transported to CT with trauma response nurse present to monitor. RN remained with the patient throughout their absence from the department for clinical observation.   GCS 14, baseline confusion.  History   Past Medical History:  Diagnosis Date   Anxiety disorder    Cardiomyopathy (HCC)    Glaucoma    Hearing loss    HTN (hypertension)    Hyperglycemia    Hyperlipidemia    LBBB (left bundle branch block)    Stroke (cerebrum) (HCC)    Vitamin D  deficiency      Past Surgical History:  Procedure Laterality Date   GLAUCOMA SURGERY  2013   TONSILLECTOMY       Initial Focused Assessment (If applicable, or please see trauma documentation): Patient alert, disoriented at baseline, GCS 14, PERR 3 Airway intact, bilateral breath sounds Pulses 2+  CT's Completed:   CT Head and CT C-Spine   Interventions:  IV, labs CXR/PXR  Plan for disposition:  Discharge home   Event Summary: Patient to ED after a fall at ALF. Patient endorses hitting her head, no obvious injuries identified. Imaging was ordered and revealed no traumatic injury. Patient able to discharge home.  Bedside handoff with ED RN [place].    Alan CROME Aynslee Mulhall  Trauma Response RN  Please call TRN at [phone] for further assistance.

## 2024-03-09 NOTE — ED Triage Notes (Signed)
 [facility] as a level 2 fall on thinners , pt states that she hit her head , no obvious injurt nooted

## 2024-03-09 NOTE — ED Notes (Signed)
 Patient family at bedside state they will take patient back to SNF.

## 2024-03-09 NOTE — Discharge Instructions (Addendum)
 Your CT imaging revealed the following: IMPRESSION:  Subacute fracture of left inferior pubic ramus.    Severe distension and large stool burden throughout the thick-walled  rectosigmoid colon.    Right labia major subcutaneous lesion likely benign sebaceous cyst.   This is a stable fracture and should heal on its own. Recommend Tylenol  for pain control

## 2024-04-05 ENCOUNTER — Other Ambulatory Visit: Payer: Self-pay | Admitting: Hematology and Oncology

## 2024-04-05 NOTE — Telephone Encounter (Signed)
 Refilled Hydroxyurea  per last office note.  Cosign requested.

## 2024-04-06 ENCOUNTER — Inpatient Hospital Stay: Attending: Hematology and Oncology

## 2024-04-06 ENCOUNTER — Inpatient Hospital Stay (HOSPITAL_BASED_OUTPATIENT_CLINIC_OR_DEPARTMENT_OTHER): Admitting: Hematology and Oncology

## 2024-04-06 VITALS — BP 130/71 | HR 79 | Temp 97.6°F | Resp 17 | Wt 93.8 lb

## 2024-04-06 DIAGNOSIS — D75839 Thrombocytosis, unspecified: Secondary | ICD-10-CM

## 2024-04-06 DIAGNOSIS — D473 Essential (hemorrhagic) thrombocythemia: Secondary | ICD-10-CM

## 2024-04-06 DIAGNOSIS — Z79899 Other long term (current) drug therapy: Secondary | ICD-10-CM | POA: Diagnosis not present

## 2024-04-06 LAB — CBC WITH DIFFERENTIAL (CANCER CENTER ONLY)
Abs Immature Granulocytes: 0.02 K/uL (ref 0.00–0.07)
Basophils Absolute: 0.1 K/uL (ref 0.0–0.1)
Basophils Relative: 1 %
Eosinophils Absolute: 0.1 K/uL (ref 0.0–0.5)
Eosinophils Relative: 1 %
HCT: 35.1 % — ABNORMAL LOW (ref 36.0–46.0)
Hemoglobin: 12.3 g/dL (ref 12.0–15.0)
Immature Granulocytes: 0 %
Lymphocytes Relative: 10 %
Lymphs Abs: 0.7 K/uL (ref 0.7–4.0)
MCH: 37.8 pg — ABNORMAL HIGH (ref 26.0–34.0)
MCHC: 35 g/dL (ref 30.0–36.0)
MCV: 108 fL — ABNORMAL HIGH (ref 80.0–100.0)
Monocytes Absolute: 0.5 K/uL (ref 0.1–1.0)
Monocytes Relative: 8 %
Neutro Abs: 5.4 K/uL (ref 1.7–7.7)
Neutrophils Relative %: 80 %
Platelet Count: 250 K/uL (ref 150–400)
RBC: 3.25 MIL/uL — ABNORMAL LOW (ref 3.87–5.11)
RDW: 14.3 % (ref 11.5–15.5)
WBC Count: 6.7 K/uL (ref 4.0–10.5)
nRBC: 0 % (ref 0.0–0.2)

## 2024-04-06 NOTE — Assessment & Plan Note (Signed)
 Lab review: 10/09/2021: Platelets 496 04/18/2022: Platelets 572 07/29/2022: Platelets 706 08/20/22: Platelets 605, Iron studies: Sat: 20%, Ferritin 36, Cr: 1.18, MPN Panel: CALR mutation 11/12/2022: Platelets 781 12/03/2022: Platelets 784 06/05/2023: Platelets 1075 06/17/2023: Platelets 854 07/18/2023: Platelets 513 09/16/2023: Platelets 258 03/09/2024: Platelets 271     Recommendation: Currently on Hydrea  500 mg daily (Monday through Friday, off on Saturday and Sunday starting 09/16/2023)   Recheck labs in 6 month and follow-up

## 2024-04-06 NOTE — Progress Notes (Signed)
 Patient Care Team: Pcp, No as PCP - General Leslie Oneil BROCKS, MD as PCP - Cardiology (Cardiology) Leslie [name], [facility] (Inactive) as Pharmacist (Pharmacist) Leslie [name], [facility] (Inactive) as Pharmacist (Pharmacist)  DIAGNOSIS:  Encounter Diagnosis  Name Primary?   Essential thrombocytosis (HCC) Yes    CHIEF COMPLIANT: Follow-up of essential thrombocytosis on Hydrea   HISTORY OF PRESENT ILLNESS:  History of Present Illness [name] is a 89 year old female with a platelet disorder who presents for follow-up of her elevated platelet count with a diagnosis of essential thrombocytosis  She is currently on Hydrea  from Monday through Friday, with a recent platelet count of 250, down from previous counts of 800 or more. She tolerates the medication well without adverse effects. Recently, she experienced a fall at home but sustained no significant injuries. Her weight has decreased from 99 pounds in August to 93 pounds currently. She remains active, walking for thirty minutes five days a week, and uses a stick for support when walking outside.     ALLERGIES:  is allergic to advil [ibuprofen], atenolol, and adhesive [tape].  MEDICATIONS:  Current Outpatient Medications  Medication Sig Dispense Refill   atorvastatin  (LIPITOR) 20 MG tablet TAKE 1 TABLET DAILY FOR CHOLESTEROL 90 tablet 3   Cholecalciferol  (VITAMIN D3) 25 MCG (1000 UT) CAPS Take 3 capsules  (3,000 units) Daily 1 capsule 0   ELIQUIS  2.5 MG TABS tablet TAKE 1 TABLET TWICE A DAY 180 tablet 3   hydroxyurea  (HYDREA ) 500 MG capsule TAKE 1 CAPSULE DAILY. MAY TAKE WITH FOOD TO MINIMIZE GASTROINTESTINAL SIDE EFFECTS 30 capsule 11   latanoprost  (XALATAN ) 0.005 % ophthalmic solution Place 1 drop into both eyes at bedtime.     metoprolol  succinate (TOPROL -XL) 25 MG 24 hr tablet TAKE 1 TABLET DAILY FOR HEART AND BLOOD PRESSURE 90 tablet 3   QUEtiapine  (SEROQUEL  XR) 50 MG TB24 24 hr tablet TAKE 1 TABLET DAILY AT LUNCHTIME FOR  MEMORY AND CONCENTRATION 90 tablet 3   spironolactone (ALDACTONE) 25 MG tablet Take 25 mg by mouth daily.     AREXVY 120 MCG/0.5ML injection 0.5 mLs. (Patient not taking: Reported on 04/06/2024)     No current facility-administered medications for this visit.    PHYSICAL EXAMINATION: ECOG PERFORMANCE STATUS: 1 - Symptomatic but completely ambulatory  Vitals:   04/06/24 1029  BP: 130/71  Pulse: 79  Resp: 17  Temp: 97.6 F (36.4 C)  SpO2: 100%   Filed Weights   04/06/24 1029  Weight: 93 lb 12.8 oz (42.5 kg)    Physical Exam MEASUREMENTS: Weight- 93.  (exam performed in the presence of a chaperone)  LABORATORY DATA:  I have reviewed the data as listed    Latest Ref Rng & Units 03/09/2024    5:28 PM 07/15/2023   10:54 AM 02/27/2023    1:15 AM  CMP  Glucose 70 - 99 mg/dL 98  889  70   BUN 8 - 23 mg/dL 19  38  21   Creatinine 0.44 - 1.00 mg/dL 9.01  8.85  9.04   Sodium 135 - 145 mmol/L 130  144  136   Potassium 3.5 - 5.1 mmol/L 4.3  5.2  3.4   Chloride 98 - 111 mmol/L 97  107  106   CO2 22 - 32 mmol/L 21  29  23    Calcium  8.9 - 10.3 mg/dL 9.5  89.6  7.9   Total Protein 6.1 - 8.1 g/dL  6.8  4.2  Total Bilirubin 0.2 - 1.2 mg/dL  0.4  1.5   Alkaline Phos 38 - 126 U/L   37   AST 10 - 35 U/L  16  11   ALT 6 - 29 U/L  15  10     Lab Results  Component Value Date   WBC 6.7 04/06/2024   HGB 12.3 04/06/2024   HCT 35.1 (L) 04/06/2024   MCV 108.0 (H) 04/06/2024   PLT 250 04/06/2024   NEUTROABS 5.4 04/06/2024    ASSESSMENT & PLAN:  Essential thrombocytosis (HCC) Lab review: 10/09/2021: Platelets 496 04/18/2022: Platelets 572 07/29/2022: Platelets 706 08/20/22: Platelets 605, Iron studies: Sat: 20%, Ferritin 36, Cr: 1.18, MPN Panel: CALR mutation 11/12/2022: Platelets 781 12/03/2022: Platelets 784 06/05/2023: Platelets 1075 06/17/2023: Platelets 854 07/18/2023: Platelets 513 09/16/2023: Platelets 258 04/06/2024: Platelets 250     Recommendation: Currently on Hydrea  500 mg  daily (Monday through Friday, off on Saturday and Sunday starting 09/16/2023) we reduced to Monday through Thursday from 04/06/2024   Recheck labs in 6 month and follow-up  Assessment & Plan Essential thrombocythemia Condition well-controlled with Hydrea , platelet count reduced to 250. No adverse effects reported. - Reduce Hydrea  dosage to Monday through Thursday. - Schedule follow-up blood count checks every six months.      No orders of the defined types were placed in this encounter.  The patient has a good understanding of the overall plan. she agrees with it. she will call with any problems that may develop before the next visit here. Total time spent: 30 mins including face to face time and time spent for planning, charting and co-ordination of care   Leslie MARLA Chad, MD 04/06/24

## 2024-04-29 ENCOUNTER — Ambulatory Visit: Payer: Medicare Other | Admitting: Nurse Practitioner

## 2024-07-20 ENCOUNTER — Other Ambulatory Visit: Payer: Self-pay

## 2024-07-27 ENCOUNTER — Other Ambulatory Visit: Payer: Self-pay

## 2024-07-28 ENCOUNTER — Encounter: Payer: Medicare Other | Admitting: Internal Medicine

## 2024-08-02 ENCOUNTER — Encounter: Payer: Medicare Other | Admitting: Internal Medicine

## 2024-10-04 ENCOUNTER — Ambulatory Visit: Admitting: Hematology and Oncology

## 2024-10-04 ENCOUNTER — Other Ambulatory Visit
